# Patient Record
Sex: Female | Born: 1940 | Race: White | Hispanic: No | Marital: Married | Smoking: Former smoker
Health system: Southern US, Community
[De-identification: ages and names within clinical notes are randomized; demographics above are authoritative.]

## PROBLEM LIST (undated history)

## (undated) DIAGNOSIS — E079 Disorder of thyroid, unspecified: Secondary | ICD-10-CM

## (undated) DIAGNOSIS — E119 Type 2 diabetes mellitus without complications: Secondary | ICD-10-CM

## (undated) DIAGNOSIS — E538 Deficiency of other specified B group vitamins: Secondary | ICD-10-CM

## (undated) DIAGNOSIS — D689 Coagulation defect, unspecified: Secondary | ICD-10-CM

## (undated) DIAGNOSIS — D469 Myelodysplastic syndrome, unspecified: Secondary | ICD-10-CM

## (undated) DIAGNOSIS — D649 Anemia, unspecified: Secondary | ICD-10-CM

## (undated) DIAGNOSIS — D500 Iron deficiency anemia secondary to blood loss (chronic): Secondary | ICD-10-CM

## (undated) DIAGNOSIS — K589 Irritable bowel syndrome without diarrhea: Secondary | ICD-10-CM

## (undated) HISTORY — PX: APPENDECTOMY: SHX54

## (undated) HISTORY — PX: CHOLECYSTECTOMY: SHX55

## (undated) HISTORY — DX: Irritable bowel syndrome, unspecified: K58.9

## (undated) HISTORY — DX: Anemia, unspecified: D64.9

## (undated) HISTORY — DX: Disorder of thyroid, unspecified: E07.9

## (undated) HISTORY — DX: Deficiency of other specified B group vitamins: E53.8

## (undated) HISTORY — DX: Iron deficiency anemia secondary to blood loss (chronic): D50.0

## (undated) HISTORY — DX: Coagulation defect, unspecified: D68.9

## (undated) HISTORY — DX: Type 2 diabetes mellitus without complications: E11.9

## (undated) HISTORY — DX: Myelodysplastic syndrome, unspecified: D46.9

## (undated) HISTORY — PX: OTHER SURGICAL HISTORY: SHX169

---

## 2014-07-10 HISTORY — PX: CORONARY ANGIOPLASTY WITH STENT PLACEMENT: SHX49

## 2015-06-07 LAB — PULMONARY FUNCTION TEST

## 2016-07-21 DIAGNOSIS — E11649 Type 2 diabetes mellitus with hypoglycemia without coma: Secondary | ICD-10-CM | POA: Diagnosis not present

## 2016-07-21 DIAGNOSIS — E1121 Type 2 diabetes mellitus with diabetic nephropathy: Secondary | ICD-10-CM | POA: Diagnosis not present

## 2016-07-21 DIAGNOSIS — E039 Hypothyroidism, unspecified: Secondary | ICD-10-CM | POA: Diagnosis not present

## 2016-07-21 DIAGNOSIS — E782 Mixed hyperlipidemia: Secondary | ICD-10-CM | POA: Diagnosis not present

## 2016-08-01 DIAGNOSIS — L820 Inflamed seborrheic keratosis: Secondary | ICD-10-CM | POA: Diagnosis not present

## 2016-08-01 DIAGNOSIS — I10 Essential (primary) hypertension: Secondary | ICD-10-CM | POA: Diagnosis not present

## 2016-08-01 DIAGNOSIS — E1121 Type 2 diabetes mellitus with diabetic nephropathy: Secondary | ICD-10-CM | POA: Diagnosis not present

## 2016-08-01 DIAGNOSIS — E782 Mixed hyperlipidemia: Secondary | ICD-10-CM | POA: Diagnosis not present

## 2016-08-01 DIAGNOSIS — E039 Hypothyroidism, unspecified: Secondary | ICD-10-CM | POA: Diagnosis not present

## 2016-12-13 DIAGNOSIS — E1151 Type 2 diabetes mellitus with diabetic peripheral angiopathy without gangrene: Secondary | ICD-10-CM | POA: Diagnosis not present

## 2016-12-13 DIAGNOSIS — L84 Corns and callosities: Secondary | ICD-10-CM | POA: Diagnosis not present

## 2016-12-13 DIAGNOSIS — L609 Nail disorder, unspecified: Secondary | ICD-10-CM | POA: Diagnosis not present

## 2017-01-18 DIAGNOSIS — I5020 Unspecified systolic (congestive) heart failure: Secondary | ICD-10-CM | POA: Diagnosis not present

## 2017-01-18 DIAGNOSIS — I10 Essential (primary) hypertension: Secondary | ICD-10-CM | POA: Diagnosis not present

## 2017-01-18 DIAGNOSIS — I2510 Atherosclerotic heart disease of native coronary artery without angina pectoris: Secondary | ICD-10-CM | POA: Diagnosis not present

## 2017-01-18 DIAGNOSIS — E785 Hyperlipidemia, unspecified: Secondary | ICD-10-CM | POA: Diagnosis not present

## 2017-01-19 DIAGNOSIS — E782 Mixed hyperlipidemia: Secondary | ICD-10-CM | POA: Diagnosis not present

## 2017-01-19 DIAGNOSIS — E039 Hypothyroidism, unspecified: Secondary | ICD-10-CM | POA: Diagnosis not present

## 2017-01-19 DIAGNOSIS — E1121 Type 2 diabetes mellitus with diabetic nephropathy: Secondary | ICD-10-CM | POA: Diagnosis not present

## 2017-01-22 DIAGNOSIS — E782 Mixed hyperlipidemia: Secondary | ICD-10-CM | POA: Diagnosis not present

## 2017-01-22 DIAGNOSIS — E1121 Type 2 diabetes mellitus with diabetic nephropathy: Secondary | ICD-10-CM | POA: Diagnosis not present

## 2017-01-22 DIAGNOSIS — E039 Hypothyroidism, unspecified: Secondary | ICD-10-CM | POA: Diagnosis not present

## 2017-01-29 DIAGNOSIS — E1121 Type 2 diabetes mellitus with diabetic nephropathy: Secondary | ICD-10-CM | POA: Diagnosis not present

## 2017-01-29 DIAGNOSIS — R609 Edema, unspecified: Secondary | ICD-10-CM | POA: Diagnosis not present

## 2017-01-29 DIAGNOSIS — E782 Mixed hyperlipidemia: Secondary | ICD-10-CM | POA: Diagnosis not present

## 2017-01-29 DIAGNOSIS — E039 Hypothyroidism, unspecified: Secondary | ICD-10-CM | POA: Diagnosis not present

## 2017-01-29 DIAGNOSIS — I10 Essential (primary) hypertension: Secondary | ICD-10-CM | POA: Diagnosis not present

## 2017-03-06 DIAGNOSIS — Z1231 Encounter for screening mammogram for malignant neoplasm of breast: Secondary | ICD-10-CM | POA: Diagnosis not present

## 2017-03-26 DIAGNOSIS — Z23 Encounter for immunization: Secondary | ICD-10-CM | POA: Diagnosis not present

## 2017-07-24 DIAGNOSIS — I2510 Atherosclerotic heart disease of native coronary artery without angina pectoris: Secondary | ICD-10-CM | POA: Diagnosis not present

## 2017-07-24 DIAGNOSIS — I5020 Unspecified systolic (congestive) heart failure: Secondary | ICD-10-CM | POA: Diagnosis not present

## 2017-07-24 DIAGNOSIS — E785 Hyperlipidemia, unspecified: Secondary | ICD-10-CM | POA: Diagnosis not present

## 2017-07-24 DIAGNOSIS — I10 Essential (primary) hypertension: Secondary | ICD-10-CM | POA: Diagnosis not present

## 2017-08-06 DIAGNOSIS — R0989 Other specified symptoms and signs involving the circulatory and respiratory systems: Secondary | ICD-10-CM | POA: Diagnosis not present

## 2017-08-06 DIAGNOSIS — I2510 Atherosclerotic heart disease of native coronary artery without angina pectoris: Secondary | ICD-10-CM | POA: Diagnosis present

## 2017-08-06 DIAGNOSIS — I248 Other forms of acute ischemic heart disease: Secondary | ICD-10-CM | POA: Diagnosis present

## 2017-08-06 DIAGNOSIS — I110 Hypertensive heart disease with heart failure: Secondary | ICD-10-CM | POA: Diagnosis present

## 2017-08-06 DIAGNOSIS — J189 Pneumonia, unspecified organism: Secondary | ICD-10-CM | POA: Diagnosis not present

## 2017-08-06 DIAGNOSIS — I214 Non-ST elevation (NSTEMI) myocardial infarction: Secondary | ICD-10-CM | POA: Diagnosis not present

## 2017-08-06 DIAGNOSIS — R0600 Dyspnea, unspecified: Secondary | ICD-10-CM | POA: Diagnosis not present

## 2017-08-06 DIAGNOSIS — E119 Type 2 diabetes mellitus without complications: Secondary | ICD-10-CM | POA: Diagnosis present

## 2017-08-06 DIAGNOSIS — R079 Chest pain, unspecified: Secondary | ICD-10-CM | POA: Diagnosis not present

## 2017-08-06 DIAGNOSIS — R262 Difficulty in walking, not elsewhere classified: Secondary | ICD-10-CM | POA: Diagnosis not present

## 2017-08-06 DIAGNOSIS — K5731 Diverticulosis of large intestine without perforation or abscess with bleeding: Secondary | ICD-10-CM | POA: Diagnosis present

## 2017-08-06 DIAGNOSIS — K588 Other irritable bowel syndrome: Secondary | ICD-10-CM | POA: Diagnosis not present

## 2017-08-06 DIAGNOSIS — J449 Chronic obstructive pulmonary disease, unspecified: Secondary | ICD-10-CM | POA: Diagnosis present

## 2017-08-06 DIAGNOSIS — D62 Acute posthemorrhagic anemia: Secondary | ICD-10-CM | POA: Diagnosis not present

## 2017-08-06 DIAGNOSIS — Z9989 Dependence on other enabling machines and devices: Secondary | ICD-10-CM | POA: Diagnosis not present

## 2017-08-06 DIAGNOSIS — R52 Pain, unspecified: Secondary | ICD-10-CM | POA: Diagnosis not present

## 2017-08-06 DIAGNOSIS — Z743 Need for continuous supervision: Secondary | ICD-10-CM | POA: Diagnosis not present

## 2017-08-06 DIAGNOSIS — I5033 Acute on chronic diastolic (congestive) heart failure: Secondary | ICD-10-CM | POA: Diagnosis present

## 2017-08-06 DIAGNOSIS — D649 Anemia, unspecified: Secondary | ICD-10-CM | POA: Diagnosis not present

## 2017-08-06 DIAGNOSIS — K922 Gastrointestinal hemorrhage, unspecified: Secondary | ICD-10-CM | POA: Diagnosis not present

## 2017-08-06 DIAGNOSIS — K589 Irritable bowel syndrome without diarrhea: Secondary | ICD-10-CM | POA: Diagnosis present

## 2017-08-06 DIAGNOSIS — R531 Weakness: Secondary | ICD-10-CM | POA: Diagnosis not present

## 2017-08-06 DIAGNOSIS — I21A1 Myocardial infarction type 2: Secondary | ICD-10-CM | POA: Diagnosis present

## 2017-08-06 DIAGNOSIS — R0602 Shortness of breath: Secondary | ICD-10-CM | POA: Diagnosis not present

## 2017-08-06 DIAGNOSIS — Z7982 Long term (current) use of aspirin: Secondary | ICD-10-CM | POA: Diagnosis not present

## 2017-08-06 DIAGNOSIS — K625 Hemorrhage of anus and rectum: Secondary | ICD-10-CM | POA: Diagnosis not present

## 2017-08-06 DIAGNOSIS — R071 Chest pain on breathing: Secondary | ICD-10-CM | POA: Diagnosis not present

## 2017-08-06 DIAGNOSIS — R918 Other nonspecific abnormal finding of lung field: Secondary | ICD-10-CM | POA: Diagnosis not present

## 2017-08-06 DIAGNOSIS — K921 Melena: Secondary | ICD-10-CM | POA: Diagnosis not present

## 2017-08-06 DIAGNOSIS — J9811 Atelectasis: Secondary | ICD-10-CM | POA: Diagnosis not present

## 2017-08-06 DIAGNOSIS — J690 Pneumonitis due to inhalation of food and vomit: Secondary | ICD-10-CM | POA: Diagnosis present

## 2017-08-06 DIAGNOSIS — I080 Rheumatic disorders of both mitral and aortic valves: Secondary | ICD-10-CM | POA: Diagnosis present

## 2017-08-06 DIAGNOSIS — D500 Iron deficiency anemia secondary to blood loss (chronic): Secondary | ICD-10-CM | POA: Diagnosis present

## 2017-08-06 DIAGNOSIS — I959 Hypotension, unspecified: Secondary | ICD-10-CM | POA: Diagnosis not present

## 2017-08-06 DIAGNOSIS — Z87891 Personal history of nicotine dependence: Secondary | ICD-10-CM | POA: Diagnosis not present

## 2017-08-06 DIAGNOSIS — E785 Hyperlipidemia, unspecified: Secondary | ICD-10-CM | POA: Diagnosis present

## 2017-08-06 DIAGNOSIS — E039 Hypothyroidism, unspecified: Secondary | ICD-10-CM | POA: Diagnosis present

## 2017-08-06 DIAGNOSIS — I10 Essential (primary) hypertension: Secondary | ICD-10-CM | POA: Diagnosis not present

## 2017-08-06 DIAGNOSIS — J90 Pleural effusion, not elsewhere classified: Secondary | ICD-10-CM | POA: Diagnosis not present

## 2017-08-06 DIAGNOSIS — R0789 Other chest pain: Secondary | ICD-10-CM | POA: Diagnosis not present

## 2017-08-06 DIAGNOSIS — Z794 Long term (current) use of insulin: Secondary | ICD-10-CM | POA: Diagnosis not present

## 2017-08-09 DIAGNOSIS — E785 Hyperlipidemia, unspecified: Secondary | ICD-10-CM | POA: Diagnosis not present

## 2017-08-09 DIAGNOSIS — K922 Gastrointestinal hemorrhage, unspecified: Secondary | ICD-10-CM | POA: Diagnosis not present

## 2017-08-09 DIAGNOSIS — E1165 Type 2 diabetes mellitus with hyperglycemia: Secondary | ICD-10-CM | POA: Diagnosis not present

## 2017-08-09 DIAGNOSIS — K222 Esophageal obstruction: Secondary | ICD-10-CM | POA: Diagnosis not present

## 2017-08-09 DIAGNOSIS — R262 Difficulty in walking, not elsewhere classified: Secondary | ICD-10-CM | POA: Diagnosis not present

## 2017-08-09 DIAGNOSIS — Z87891 Personal history of nicotine dependence: Secondary | ICD-10-CM | POA: Diagnosis not present

## 2017-08-09 DIAGNOSIS — K588 Other irritable bowel syndrome: Secondary | ICD-10-CM | POA: Diagnosis not present

## 2017-08-09 DIAGNOSIS — R0789 Other chest pain: Secondary | ICD-10-CM | POA: Diagnosis not present

## 2017-08-09 DIAGNOSIS — I252 Old myocardial infarction: Secondary | ICD-10-CM | POA: Diagnosis not present

## 2017-08-09 DIAGNOSIS — K5520 Angiodysplasia of colon without hemorrhage: Secondary | ICD-10-CM | POA: Diagnosis not present

## 2017-08-09 DIAGNOSIS — K31819 Angiodysplasia of stomach and duodenum without bleeding: Secondary | ICD-10-CM | POA: Diagnosis not present

## 2017-08-09 DIAGNOSIS — J189 Pneumonia, unspecified organism: Secondary | ICD-10-CM | POA: Diagnosis not present

## 2017-08-09 DIAGNOSIS — E119 Type 2 diabetes mellitus without complications: Secondary | ICD-10-CM | POA: Diagnosis not present

## 2017-08-09 DIAGNOSIS — E039 Hypothyroidism, unspecified: Secondary | ICD-10-CM | POA: Diagnosis not present

## 2017-08-09 DIAGNOSIS — F4322 Adjustment disorder with anxiety: Secondary | ICD-10-CM | POA: Diagnosis not present

## 2017-08-09 DIAGNOSIS — K635 Polyp of colon: Secondary | ICD-10-CM | POA: Diagnosis not present

## 2017-08-09 DIAGNOSIS — Q2733 Arteriovenous malformation of digestive system vessel: Secondary | ICD-10-CM | POA: Diagnosis not present

## 2017-08-09 DIAGNOSIS — Z743 Need for continuous supervision: Secondary | ICD-10-CM | POA: Diagnosis not present

## 2017-08-09 DIAGNOSIS — Z794 Long term (current) use of insulin: Secondary | ICD-10-CM | POA: Diagnosis not present

## 2017-08-09 DIAGNOSIS — D649 Anemia, unspecified: Secondary | ICD-10-CM | POA: Diagnosis not present

## 2017-08-09 DIAGNOSIS — D122 Benign neoplasm of ascending colon: Secondary | ICD-10-CM | POA: Diagnosis not present

## 2017-08-09 DIAGNOSIS — K648 Other hemorrhoids: Secondary | ICD-10-CM | POA: Diagnosis not present

## 2017-08-09 DIAGNOSIS — K921 Melena: Secondary | ICD-10-CM | POA: Diagnosis not present

## 2017-08-09 DIAGNOSIS — K2950 Unspecified chronic gastritis without bleeding: Secondary | ICD-10-CM | POA: Diagnosis not present

## 2017-08-09 DIAGNOSIS — R531 Weakness: Secondary | ICD-10-CM | POA: Diagnosis not present

## 2017-08-09 DIAGNOSIS — J690 Pneumonitis due to inhalation of food and vomit: Secondary | ICD-10-CM | POA: Diagnosis not present

## 2017-08-09 DIAGNOSIS — I10 Essential (primary) hypertension: Secondary | ICD-10-CM | POA: Diagnosis not present

## 2017-08-09 DIAGNOSIS — Z955 Presence of coronary angioplasty implant and graft: Secondary | ICD-10-CM | POA: Diagnosis not present

## 2017-08-09 DIAGNOSIS — E7800 Pure hypercholesterolemia, unspecified: Secondary | ICD-10-CM | POA: Diagnosis not present

## 2017-08-09 DIAGNOSIS — R52 Pain, unspecified: Secondary | ICD-10-CM | POA: Diagnosis not present

## 2017-08-10 DIAGNOSIS — E039 Hypothyroidism, unspecified: Secondary | ICD-10-CM | POA: Diagnosis not present

## 2017-08-10 DIAGNOSIS — I10 Essential (primary) hypertension: Secondary | ICD-10-CM | POA: Diagnosis not present

## 2017-08-10 DIAGNOSIS — E785 Hyperlipidemia, unspecified: Secondary | ICD-10-CM | POA: Diagnosis not present

## 2017-08-10 DIAGNOSIS — K922 Gastrointestinal hemorrhage, unspecified: Secondary | ICD-10-CM | POA: Diagnosis not present

## 2017-08-10 DIAGNOSIS — E1165 Type 2 diabetes mellitus with hyperglycemia: Secondary | ICD-10-CM | POA: Diagnosis not present

## 2017-08-10 DIAGNOSIS — Z87891 Personal history of nicotine dependence: Secondary | ICD-10-CM | POA: Diagnosis not present

## 2017-08-10 DIAGNOSIS — J690 Pneumonitis due to inhalation of food and vomit: Secondary | ICD-10-CM | POA: Diagnosis not present

## 2017-08-17 DIAGNOSIS — I10 Essential (primary) hypertension: Secondary | ICD-10-CM | POA: Diagnosis not present

## 2017-08-17 DIAGNOSIS — D122 Benign neoplasm of ascending colon: Secondary | ICD-10-CM | POA: Diagnosis not present

## 2017-08-17 DIAGNOSIS — J690 Pneumonitis due to inhalation of food and vomit: Secondary | ICD-10-CM | POA: Diagnosis not present

## 2017-08-17 DIAGNOSIS — K222 Esophageal obstruction: Secondary | ICD-10-CM | POA: Diagnosis not present

## 2017-08-17 DIAGNOSIS — K921 Melena: Secondary | ICD-10-CM | POA: Diagnosis not present

## 2017-08-17 DIAGNOSIS — E039 Hypothyroidism, unspecified: Secondary | ICD-10-CM | POA: Diagnosis not present

## 2017-08-17 DIAGNOSIS — K648 Other hemorrhoids: Secondary | ICD-10-CM | POA: Diagnosis not present

## 2017-08-17 DIAGNOSIS — K922 Gastrointestinal hemorrhage, unspecified: Secondary | ICD-10-CM | POA: Diagnosis not present

## 2017-08-17 DIAGNOSIS — Q2733 Arteriovenous malformation of digestive system vessel: Secondary | ICD-10-CM | POA: Diagnosis not present

## 2017-08-17 DIAGNOSIS — Z955 Presence of coronary angioplasty implant and graft: Secondary | ICD-10-CM | POA: Diagnosis not present

## 2017-08-17 DIAGNOSIS — K5520 Angiodysplasia of colon without hemorrhage: Secondary | ICD-10-CM | POA: Diagnosis not present

## 2017-08-17 DIAGNOSIS — E1165 Type 2 diabetes mellitus with hyperglycemia: Secondary | ICD-10-CM | POA: Diagnosis not present

## 2017-08-17 DIAGNOSIS — K2950 Unspecified chronic gastritis without bleeding: Secondary | ICD-10-CM | POA: Diagnosis not present

## 2017-08-17 DIAGNOSIS — K635 Polyp of colon: Secondary | ICD-10-CM | POA: Diagnosis not present

## 2017-08-17 DIAGNOSIS — K31819 Angiodysplasia of stomach and duodenum without bleeding: Secondary | ICD-10-CM | POA: Diagnosis not present

## 2017-08-17 DIAGNOSIS — D649 Anemia, unspecified: Secondary | ICD-10-CM | POA: Diagnosis not present

## 2017-08-17 DIAGNOSIS — Z87891 Personal history of nicotine dependence: Secondary | ICD-10-CM | POA: Diagnosis not present

## 2017-08-21 DIAGNOSIS — Z794 Long term (current) use of insulin: Secondary | ICD-10-CM | POA: Diagnosis not present

## 2017-08-21 DIAGNOSIS — I252 Old myocardial infarction: Secondary | ICD-10-CM | POA: Diagnosis not present

## 2017-08-21 DIAGNOSIS — I2510 Atherosclerotic heart disease of native coronary artery without angina pectoris: Secondary | ICD-10-CM | POA: Diagnosis not present

## 2017-08-21 DIAGNOSIS — J449 Chronic obstructive pulmonary disease, unspecified: Secondary | ICD-10-CM | POA: Diagnosis not present

## 2017-08-21 DIAGNOSIS — E119 Type 2 diabetes mellitus without complications: Secondary | ICD-10-CM | POA: Diagnosis not present

## 2017-08-21 DIAGNOSIS — K5731 Diverticulosis of large intestine without perforation or abscess with bleeding: Secondary | ICD-10-CM | POA: Diagnosis not present

## 2017-08-21 DIAGNOSIS — K219 Gastro-esophageal reflux disease without esophagitis: Secondary | ICD-10-CM | POA: Diagnosis not present

## 2017-08-21 DIAGNOSIS — D500 Iron deficiency anemia secondary to blood loss (chronic): Secondary | ICD-10-CM | POA: Diagnosis not present

## 2017-08-21 DIAGNOSIS — Z87891 Personal history of nicotine dependence: Secondary | ICD-10-CM | POA: Diagnosis not present

## 2017-08-21 DIAGNOSIS — I110 Hypertensive heart disease with heart failure: Secondary | ICD-10-CM | POA: Diagnosis not present

## 2017-08-21 DIAGNOSIS — I5032 Chronic diastolic (congestive) heart failure: Secondary | ICD-10-CM | POA: Diagnosis not present

## 2017-08-22 DIAGNOSIS — K5731 Diverticulosis of large intestine without perforation or abscess with bleeding: Secondary | ICD-10-CM | POA: Diagnosis not present

## 2017-08-22 DIAGNOSIS — D500 Iron deficiency anemia secondary to blood loss (chronic): Secondary | ICD-10-CM | POA: Diagnosis not present

## 2017-08-22 DIAGNOSIS — I252 Old myocardial infarction: Secondary | ICD-10-CM | POA: Diagnosis not present

## 2017-08-22 DIAGNOSIS — E119 Type 2 diabetes mellitus without complications: Secondary | ICD-10-CM | POA: Diagnosis not present

## 2017-08-22 DIAGNOSIS — I5032 Chronic diastolic (congestive) heart failure: Secondary | ICD-10-CM | POA: Diagnosis not present

## 2017-08-22 DIAGNOSIS — I110 Hypertensive heart disease with heart failure: Secondary | ICD-10-CM | POA: Diagnosis not present

## 2017-08-24 DIAGNOSIS — I10 Essential (primary) hypertension: Secondary | ICD-10-CM | POA: Diagnosis not present

## 2017-08-24 DIAGNOSIS — R531 Weakness: Secondary | ICD-10-CM | POA: Diagnosis not present

## 2017-08-24 DIAGNOSIS — E1121 Type 2 diabetes mellitus with diabetic nephropathy: Secondary | ICD-10-CM | POA: Diagnosis not present

## 2017-08-24 DIAGNOSIS — J189 Pneumonia, unspecified organism: Secondary | ICD-10-CM | POA: Diagnosis not present

## 2017-08-24 DIAGNOSIS — D649 Anemia, unspecified: Secondary | ICD-10-CM | POA: Diagnosis not present

## 2017-08-24 DIAGNOSIS — E782 Mixed hyperlipidemia: Secondary | ICD-10-CM | POA: Diagnosis not present

## 2017-08-27 DIAGNOSIS — E119 Type 2 diabetes mellitus without complications: Secondary | ICD-10-CM | POA: Diagnosis not present

## 2017-08-27 DIAGNOSIS — K5731 Diverticulosis of large intestine without perforation or abscess with bleeding: Secondary | ICD-10-CM | POA: Diagnosis not present

## 2017-08-27 DIAGNOSIS — I252 Old myocardial infarction: Secondary | ICD-10-CM | POA: Diagnosis not present

## 2017-08-27 DIAGNOSIS — I5032 Chronic diastolic (congestive) heart failure: Secondary | ICD-10-CM | POA: Diagnosis not present

## 2017-08-27 DIAGNOSIS — I110 Hypertensive heart disease with heart failure: Secondary | ICD-10-CM | POA: Diagnosis not present

## 2017-08-27 DIAGNOSIS — D500 Iron deficiency anemia secondary to blood loss (chronic): Secondary | ICD-10-CM | POA: Diagnosis not present

## 2017-08-29 DIAGNOSIS — I252 Old myocardial infarction: Secondary | ICD-10-CM | POA: Diagnosis not present

## 2017-08-29 DIAGNOSIS — D500 Iron deficiency anemia secondary to blood loss (chronic): Secondary | ICD-10-CM | POA: Diagnosis not present

## 2017-08-29 DIAGNOSIS — E119 Type 2 diabetes mellitus without complications: Secondary | ICD-10-CM | POA: Diagnosis not present

## 2017-08-29 DIAGNOSIS — K5731 Diverticulosis of large intestine without perforation or abscess with bleeding: Secondary | ICD-10-CM | POA: Diagnosis not present

## 2017-08-29 DIAGNOSIS — I5032 Chronic diastolic (congestive) heart failure: Secondary | ICD-10-CM | POA: Diagnosis not present

## 2017-08-29 DIAGNOSIS — I110 Hypertensive heart disease with heart failure: Secondary | ICD-10-CM | POA: Diagnosis not present

## 2017-08-31 DIAGNOSIS — I5032 Chronic diastolic (congestive) heart failure: Secondary | ICD-10-CM | POA: Diagnosis not present

## 2017-08-31 DIAGNOSIS — E119 Type 2 diabetes mellitus without complications: Secondary | ICD-10-CM | POA: Diagnosis not present

## 2017-08-31 DIAGNOSIS — K5731 Diverticulosis of large intestine without perforation or abscess with bleeding: Secondary | ICD-10-CM | POA: Diagnosis not present

## 2017-08-31 DIAGNOSIS — I110 Hypertensive heart disease with heart failure: Secondary | ICD-10-CM | POA: Diagnosis not present

## 2017-08-31 DIAGNOSIS — I252 Old myocardial infarction: Secondary | ICD-10-CM | POA: Diagnosis not present

## 2017-08-31 DIAGNOSIS — D500 Iron deficiency anemia secondary to blood loss (chronic): Secondary | ICD-10-CM | POA: Diagnosis not present

## 2017-09-04 DIAGNOSIS — D500 Iron deficiency anemia secondary to blood loss (chronic): Secondary | ICD-10-CM | POA: Diagnosis not present

## 2017-09-04 DIAGNOSIS — K5731 Diverticulosis of large intestine without perforation or abscess with bleeding: Secondary | ICD-10-CM | POA: Diagnosis not present

## 2017-09-04 DIAGNOSIS — I5032 Chronic diastolic (congestive) heart failure: Secondary | ICD-10-CM | POA: Diagnosis not present

## 2017-09-04 DIAGNOSIS — E119 Type 2 diabetes mellitus without complications: Secondary | ICD-10-CM | POA: Diagnosis not present

## 2017-09-04 DIAGNOSIS — I110 Hypertensive heart disease with heart failure: Secondary | ICD-10-CM | POA: Diagnosis not present

## 2017-09-04 DIAGNOSIS — I252 Old myocardial infarction: Secondary | ICD-10-CM | POA: Diagnosis not present

## 2017-09-06 DIAGNOSIS — I5032 Chronic diastolic (congestive) heart failure: Secondary | ICD-10-CM | POA: Diagnosis not present

## 2017-09-06 DIAGNOSIS — I252 Old myocardial infarction: Secondary | ICD-10-CM | POA: Diagnosis not present

## 2017-09-06 DIAGNOSIS — K5731 Diverticulosis of large intestine without perforation or abscess with bleeding: Secondary | ICD-10-CM | POA: Diagnosis not present

## 2017-09-06 DIAGNOSIS — D500 Iron deficiency anemia secondary to blood loss (chronic): Secondary | ICD-10-CM | POA: Diagnosis not present

## 2017-09-06 DIAGNOSIS — I110 Hypertensive heart disease with heart failure: Secondary | ICD-10-CM | POA: Diagnosis not present

## 2017-09-06 DIAGNOSIS — E119 Type 2 diabetes mellitus without complications: Secondary | ICD-10-CM | POA: Diagnosis not present

## 2017-09-07 DIAGNOSIS — K625 Hemorrhage of anus and rectum: Secondary | ICD-10-CM | POA: Diagnosis not present

## 2017-09-07 DIAGNOSIS — Q2733 Arteriovenous malformation of digestive system vessel: Secondary | ICD-10-CM | POA: Diagnosis not present

## 2017-09-07 DIAGNOSIS — D696 Thrombocytopenia, unspecified: Secondary | ICD-10-CM | POA: Diagnosis not present

## 2017-09-07 DIAGNOSIS — R162 Hepatomegaly with splenomegaly, not elsewhere classified: Secondary | ICD-10-CM | POA: Diagnosis not present

## 2017-09-07 DIAGNOSIS — K760 Fatty (change of) liver, not elsewhere classified: Secondary | ICD-10-CM | POA: Diagnosis not present

## 2017-09-07 DIAGNOSIS — R531 Weakness: Secondary | ICD-10-CM | POA: Diagnosis not present

## 2017-09-07 DIAGNOSIS — E611 Iron deficiency: Secondary | ICD-10-CM | POA: Diagnosis not present

## 2017-09-07 DIAGNOSIS — D649 Anemia, unspecified: Secondary | ICD-10-CM | POA: Diagnosis not present

## 2017-09-10 DIAGNOSIS — K5731 Diverticulosis of large intestine without perforation or abscess with bleeding: Secondary | ICD-10-CM | POA: Diagnosis not present

## 2017-09-10 DIAGNOSIS — D500 Iron deficiency anemia secondary to blood loss (chronic): Secondary | ICD-10-CM | POA: Diagnosis not present

## 2017-09-10 DIAGNOSIS — I252 Old myocardial infarction: Secondary | ICD-10-CM | POA: Diagnosis not present

## 2017-09-10 DIAGNOSIS — E119 Type 2 diabetes mellitus without complications: Secondary | ICD-10-CM | POA: Diagnosis not present

## 2017-09-10 DIAGNOSIS — I5032 Chronic diastolic (congestive) heart failure: Secondary | ICD-10-CM | POA: Diagnosis not present

## 2017-09-10 DIAGNOSIS — I110 Hypertensive heart disease with heart failure: Secondary | ICD-10-CM | POA: Diagnosis not present

## 2017-09-12 DIAGNOSIS — I252 Old myocardial infarction: Secondary | ICD-10-CM | POA: Diagnosis not present

## 2017-09-12 DIAGNOSIS — E119 Type 2 diabetes mellitus without complications: Secondary | ICD-10-CM | POA: Diagnosis not present

## 2017-09-12 DIAGNOSIS — I110 Hypertensive heart disease with heart failure: Secondary | ICD-10-CM | POA: Diagnosis not present

## 2017-09-12 DIAGNOSIS — D500 Iron deficiency anemia secondary to blood loss (chronic): Secondary | ICD-10-CM | POA: Diagnosis not present

## 2017-09-12 DIAGNOSIS — I5032 Chronic diastolic (congestive) heart failure: Secondary | ICD-10-CM | POA: Diagnosis not present

## 2017-09-12 DIAGNOSIS — K5731 Diverticulosis of large intestine without perforation or abscess with bleeding: Secondary | ICD-10-CM | POA: Diagnosis not present

## 2017-09-14 DIAGNOSIS — D699 Hemorrhagic condition, unspecified: Secondary | ICD-10-CM | POA: Diagnosis not present

## 2017-09-14 DIAGNOSIS — D649 Anemia, unspecified: Secondary | ICD-10-CM | POA: Diagnosis not present

## 2017-09-14 DIAGNOSIS — N281 Cyst of kidney, acquired: Secondary | ICD-10-CM | POA: Diagnosis not present

## 2017-09-14 DIAGNOSIS — K760 Fatty (change of) liver, not elsewhere classified: Secondary | ICD-10-CM | POA: Diagnosis not present

## 2017-09-20 DIAGNOSIS — D649 Anemia, unspecified: Secondary | ICD-10-CM | POA: Diagnosis not present

## 2017-09-20 DIAGNOSIS — K625 Hemorrhage of anus and rectum: Secondary | ICD-10-CM | POA: Diagnosis not present

## 2017-09-20 DIAGNOSIS — K2970 Gastritis, unspecified, without bleeding: Secondary | ICD-10-CM | POA: Diagnosis not present

## 2017-09-20 DIAGNOSIS — I998 Other disorder of circulatory system: Secondary | ICD-10-CM | POA: Diagnosis not present

## 2017-09-21 DIAGNOSIS — Q2733 Arteriovenous malformation of digestive system vessel: Secondary | ICD-10-CM | POA: Diagnosis not present

## 2017-09-21 DIAGNOSIS — R531 Weakness: Secondary | ICD-10-CM | POA: Diagnosis not present

## 2017-09-21 DIAGNOSIS — I214 Non-ST elevation (NSTEMI) myocardial infarction: Secondary | ICD-10-CM | POA: Diagnosis not present

## 2017-09-21 DIAGNOSIS — D696 Thrombocytopenia, unspecified: Secondary | ICD-10-CM | POA: Diagnosis not present

## 2017-09-21 DIAGNOSIS — K760 Fatty (change of) liver, not elsewhere classified: Secondary | ICD-10-CM | POA: Diagnosis not present

## 2017-09-21 DIAGNOSIS — R162 Hepatomegaly with splenomegaly, not elsewhere classified: Secondary | ICD-10-CM | POA: Diagnosis not present

## 2017-09-21 DIAGNOSIS — E611 Iron deficiency: Secondary | ICD-10-CM | POA: Diagnosis not present

## 2017-09-21 DIAGNOSIS — K625 Hemorrhage of anus and rectum: Secondary | ICD-10-CM | POA: Diagnosis not present

## 2017-09-21 DIAGNOSIS — D649 Anemia, unspecified: Secondary | ICD-10-CM | POA: Diagnosis not present

## 2017-10-04 DIAGNOSIS — Q2733 Arteriovenous malformation of digestive system vessel: Secondary | ICD-10-CM | POA: Diagnosis not present

## 2017-10-04 DIAGNOSIS — K625 Hemorrhage of anus and rectum: Secondary | ICD-10-CM | POA: Diagnosis not present

## 2017-10-04 DIAGNOSIS — R162 Hepatomegaly with splenomegaly, not elsewhere classified: Secondary | ICD-10-CM | POA: Diagnosis not present

## 2017-10-04 DIAGNOSIS — D696 Thrombocytopenia, unspecified: Secondary | ICD-10-CM | POA: Diagnosis not present

## 2017-10-04 DIAGNOSIS — K760 Fatty (change of) liver, not elsewhere classified: Secondary | ICD-10-CM | POA: Diagnosis not present

## 2017-10-04 DIAGNOSIS — D46Z Other myelodysplastic syndromes: Secondary | ICD-10-CM | POA: Diagnosis not present

## 2017-10-04 DIAGNOSIS — E611 Iron deficiency: Secondary | ICD-10-CM | POA: Diagnosis not present

## 2017-10-04 DIAGNOSIS — D469 Myelodysplastic syndrome, unspecified: Secondary | ICD-10-CM | POA: Diagnosis not present

## 2017-10-04 DIAGNOSIS — R531 Weakness: Secondary | ICD-10-CM | POA: Diagnosis not present

## 2017-10-04 DIAGNOSIS — D649 Anemia, unspecified: Secondary | ICD-10-CM | POA: Diagnosis not present

## 2017-10-11 DIAGNOSIS — R233 Spontaneous ecchymoses: Secondary | ICD-10-CM | POA: Diagnosis not present

## 2017-10-11 DIAGNOSIS — D696 Thrombocytopenia, unspecified: Secondary | ICD-10-CM | POA: Diagnosis not present

## 2017-10-11 DIAGNOSIS — D469 Myelodysplastic syndrome, unspecified: Secondary | ICD-10-CM | POA: Diagnosis not present

## 2017-10-11 DIAGNOSIS — D649 Anemia, unspecified: Secondary | ICD-10-CM | POA: Diagnosis not present

## 2017-10-18 DIAGNOSIS — D649 Anemia, unspecified: Secondary | ICD-10-CM | POA: Diagnosis not present

## 2017-10-18 DIAGNOSIS — R233 Spontaneous ecchymoses: Secondary | ICD-10-CM | POA: Diagnosis not present

## 2017-10-18 DIAGNOSIS — D696 Thrombocytopenia, unspecified: Secondary | ICD-10-CM | POA: Diagnosis not present

## 2017-10-18 DIAGNOSIS — D469 Myelodysplastic syndrome, unspecified: Secondary | ICD-10-CM | POA: Diagnosis not present

## 2017-10-25 DIAGNOSIS — D469 Myelodysplastic syndrome, unspecified: Secondary | ICD-10-CM | POA: Diagnosis not present

## 2017-10-25 DIAGNOSIS — D696 Thrombocytopenia, unspecified: Secondary | ICD-10-CM | POA: Diagnosis not present

## 2017-10-25 DIAGNOSIS — R233 Spontaneous ecchymoses: Secondary | ICD-10-CM | POA: Diagnosis not present

## 2017-10-25 DIAGNOSIS — D649 Anemia, unspecified: Secondary | ICD-10-CM | POA: Diagnosis not present

## 2017-11-01 DIAGNOSIS — D696 Thrombocytopenia, unspecified: Secondary | ICD-10-CM | POA: Diagnosis not present

## 2017-11-01 DIAGNOSIS — D469 Myelodysplastic syndrome, unspecified: Secondary | ICD-10-CM | POA: Diagnosis not present

## 2017-11-01 DIAGNOSIS — D649 Anemia, unspecified: Secondary | ICD-10-CM | POA: Diagnosis not present

## 2017-11-01 DIAGNOSIS — R233 Spontaneous ecchymoses: Secondary | ICD-10-CM | POA: Diagnosis not present

## 2017-11-07 DIAGNOSIS — E119 Type 2 diabetes mellitus without complications: Secondary | ICD-10-CM | POA: Diagnosis not present

## 2017-11-07 DIAGNOSIS — I2510 Atherosclerotic heart disease of native coronary artery without angina pectoris: Secondary | ICD-10-CM | POA: Diagnosis not present

## 2017-11-07 DIAGNOSIS — K648 Other hemorrhoids: Secondary | ICD-10-CM | POA: Diagnosis not present

## 2017-11-07 DIAGNOSIS — K922 Gastrointestinal hemorrhage, unspecified: Secondary | ICD-10-CM | POA: Diagnosis not present

## 2017-11-07 DIAGNOSIS — K2950 Unspecified chronic gastritis without bleeding: Secondary | ICD-10-CM | POA: Diagnosis not present

## 2017-11-07 DIAGNOSIS — K635 Polyp of colon: Secondary | ICD-10-CM | POA: Diagnosis not present

## 2017-11-07 DIAGNOSIS — K449 Diaphragmatic hernia without obstruction or gangrene: Secondary | ICD-10-CM | POA: Diagnosis not present

## 2017-11-07 DIAGNOSIS — D649 Anemia, unspecified: Secondary | ICD-10-CM | POA: Diagnosis not present

## 2017-11-07 DIAGNOSIS — K2970 Gastritis, unspecified, without bleeding: Secondary | ICD-10-CM | POA: Diagnosis not present

## 2017-11-07 DIAGNOSIS — D122 Benign neoplasm of ascending colon: Secondary | ICD-10-CM | POA: Diagnosis not present

## 2017-11-07 DIAGNOSIS — K529 Noninfective gastroenteritis and colitis, unspecified: Secondary | ICD-10-CM | POA: Diagnosis not present

## 2017-11-07 DIAGNOSIS — K5520 Angiodysplasia of colon without hemorrhage: Secondary | ICD-10-CM | POA: Diagnosis not present

## 2017-11-07 DIAGNOSIS — E7800 Pure hypercholesterolemia, unspecified: Secondary | ICD-10-CM | POA: Diagnosis not present

## 2017-11-07 DIAGNOSIS — I509 Heart failure, unspecified: Secondary | ICD-10-CM | POA: Diagnosis not present

## 2017-11-28 ENCOUNTER — Other Ambulatory Visit: Payer: Self-pay

## 2017-11-28 ENCOUNTER — Inpatient Hospital Stay: Payer: Medicare Other

## 2017-11-28 ENCOUNTER — Encounter: Payer: Self-pay | Admitting: Oncology

## 2017-11-28 ENCOUNTER — Inpatient Hospital Stay: Payer: Medicare Other | Attending: Oncology | Admitting: Oncology

## 2017-11-28 VITALS — BP 129/56 | HR 63 | Temp 97.0°F | Resp 18 | Wt 153.6 lb

## 2017-11-28 DIAGNOSIS — K625 Hemorrhage of anus and rectum: Secondary | ICD-10-CM | POA: Diagnosis not present

## 2017-11-28 DIAGNOSIS — Q2733 Arteriovenous malformation of digestive system vessel: Secondary | ICD-10-CM | POA: Diagnosis not present

## 2017-11-28 DIAGNOSIS — R5381 Other malaise: Secondary | ICD-10-CM | POA: Insufficient documentation

## 2017-11-28 DIAGNOSIS — K760 Fatty (change of) liver, not elsewhere classified: Secondary | ICD-10-CM | POA: Diagnosis not present

## 2017-11-28 DIAGNOSIS — D46C Myelodysplastic syndrome with isolated del(5q) chromosomal abnormality: Secondary | ICD-10-CM

## 2017-11-28 DIAGNOSIS — Z794 Long term (current) use of insulin: Secondary | ICD-10-CM | POA: Insufficient documentation

## 2017-11-28 DIAGNOSIS — D696 Thrombocytopenia, unspecified: Secondary | ICD-10-CM

## 2017-11-28 DIAGNOSIS — Z79899 Other long term (current) drug therapy: Secondary | ICD-10-CM | POA: Diagnosis not present

## 2017-11-28 DIAGNOSIS — D500 Iron deficiency anemia secondary to blood loss (chronic): Secondary | ICD-10-CM | POA: Insufficient documentation

## 2017-11-28 DIAGNOSIS — Z87891 Personal history of nicotine dependence: Secondary | ICD-10-CM | POA: Insufficient documentation

## 2017-11-28 DIAGNOSIS — R040 Epistaxis: Secondary | ICD-10-CM | POA: Diagnosis not present

## 2017-11-28 DIAGNOSIS — R5383 Other fatigue: Secondary | ICD-10-CM | POA: Diagnosis not present

## 2017-11-28 DIAGNOSIS — E119 Type 2 diabetes mellitus without complications: Secondary | ICD-10-CM | POA: Diagnosis not present

## 2017-11-28 LAB — COMPREHENSIVE METABOLIC PANEL
ALT: 17 U/L (ref 14–54)
AST: 22 U/L (ref 15–41)
Albumin: 3.5 g/dL (ref 3.5–5.0)
Alkaline Phosphatase: 110 U/L (ref 38–126)
Anion gap: 7 (ref 5–15)
BUN: 17 mg/dL (ref 6–20)
CHLORIDE: 106 mmol/L (ref 101–111)
CO2: 25 mmol/L (ref 22–32)
CREATININE: 1.04 mg/dL — AB (ref 0.44–1.00)
Calcium: 8.7 mg/dL — ABNORMAL LOW (ref 8.9–10.3)
GFR calc non Af Amer: 50 mL/min — ABNORMAL LOW (ref >60)
GFR, EST AFRICAN AMERICAN: 58 mL/min — AB (ref >60)
Glucose, Bld: 101 mg/dL — ABNORMAL HIGH (ref 65–99)
Potassium: 4 mmol/L (ref 3.5–5.1)
Sodium: 138 mmol/L (ref 135–145)
Total Bilirubin: 0.8 mg/dL (ref 0.3–1.2)
Total Protein: 6.6 g/dL (ref 6.5–8.1)

## 2017-11-28 LAB — CBC WITH DIFFERENTIAL/PLATELET
Basophils Absolute: 0 10*3/uL (ref 0.0–0.1)
Basophils Relative: 0 %
EOS PCT: 3 %
Eosinophils Absolute: 0.1 10*3/uL (ref 0.0–0.7)
HCT: 31.2 % — ABNORMAL LOW (ref 36.0–46.0)
Hemoglobin: 10.2 g/dL — ABNORMAL LOW (ref 12.0–15.0)
LYMPHS ABS: 1.1 10*3/uL (ref 0.7–4.0)
LYMPHS PCT: 38 %
MCH: 28.6 pg (ref 26.0–34.0)
MCHC: 32.7 g/dL (ref 30.0–36.0)
MCV: 87.4 fL (ref 78.0–100.0)
MONO ABS: 0.5 10*3/uL (ref 0.1–1.0)
MONOS PCT: 16 %
NEUTROS PCT: 42 %
Neutro Abs: 1.2 10*3/uL — ABNORMAL LOW (ref 1.7–7.7)
PLATELETS: 24 10*3/uL — AB (ref 150–400)
RBC: 3.57 MIL/uL — ABNORMAL LOW (ref 3.87–5.11)
RDW: 22.5 % — AB (ref 11.5–15.5)
WBC: 3 10*3/uL — ABNORMAL LOW (ref 4.0–10.5)

## 2017-11-28 LAB — TECHNOLOGIST SMEAR REVIEW

## 2017-11-28 LAB — TYPE AND SCREEN
ABO/RH(D): B POS
Antibody Screen: NEGATIVE

## 2017-11-28 LAB — IRON AND TIBC
IRON: 54 ug/dL (ref 28–170)
Saturation Ratios: 25 % (ref 10.4–31.8)
TIBC: 217 ug/dL — AB (ref 250–450)
UIBC: 163 ug/dL

## 2017-11-28 LAB — FERRITIN: Ferritin: 370 ng/mL — ABNORMAL HIGH (ref 11–307)

## 2017-11-28 NOTE — Progress Notes (Signed)
New patient relocated from [place] this month.  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  Daughter is wife pt today.  She is living at [facility] with husband.

## 2017-11-28 NOTE — Progress Notes (Signed)
Hematology/Oncology Consult note [facility] Telephone:([phone] [phone]   Patient Care Team: Glean Hess, MD as PCP - General (Internal Medicine)  REFERRING PROVIDER: Glean Hess, MD  Previous Oncologist Dr.Srujitha Murkutla CHIEF COMPLAINTS/PURPOSE OF CONSULTATION:  Evaluation of thrombocytopenia.   HISTORY OF PRESENTING ILLNESS:  Yesenia Hart is a  77 y.o.  female with PMH listed below who was referred to me for evaluation of thrombocytopenia.   Recently moved from [place] to [place].  She is living at [place] with husband.  Accompanied by daughter, Yesenia Hart who is a respiratory therapist at [employer].  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  Patient presented to emergency room in January 2019 with complaints of intermittent rectal bleeding and increased to have a hemoglobin of 3 and platelet counts was 59,000.  Received 5 units of PRBC and was discharged home with hemoglobin of 10.  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  Cauterization was deferred due to thrombocytopenia. Aspirin was discontinued due to GI bleeding. Peripheral blood flow, SPEP negative, no obvious signs of B12 deficiency.  Ultrasound of the abdomen showed mild fatty liver.  On October 04, 2017, patient underwent bone marrow biopsy. Bone marrow core biopsy, bone marrow aspirate clot, pulmonary aspirate smears showed interest with single linage dysplasia.  Less than 5% bone marrow blasts and no circulating blast. Iron storage is nearly absent Peripheral blood smear showed moderate and anisopoikilocytosis Marrow aspirate normal in the neck, cells, and adenoid maturation Cytogenetics performed at the [facility] in [place] showed 46,xx, t(3;17;16)(p21;q21;q24), add (5) (q11,2),  idic(22)(p11,2)[18]/46,xx[2].  20 metaphases, lites were normal and 18 metaphases 5 q. deletion, it has translocation involving chromosome of 3, 70, 16, and iso-di centric 22.  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  Patient received IV iron infusion on moving to [place].  She has not had any treatment for MDS done due to the move.  Today patient is accompanied by her daughter to the clinic.  She reports feeling tired, no weight loss.  She reports easy bruising.  Intermittent epistaxis which spontaneously resolved after applying pressure denies any hematochezia, hematemesis, hemoptysis.    Review of Systems  Constitutional: Positive for malaise/fatigue. Negative for chills, fever and weight loss.  HENT: Negative for congestion, ear discharge, ear pain, nosebleeds, sinus pain and sore throat.   Eyes: Negative for double vision, photophobia, pain, discharge and redness.  Respiratory: Negative for cough, hemoptysis, sputum production, shortness of breath and wheezing.   Cardiovascular: Negative for chest pain, palpitations, orthopnea, claudication and leg swelling.  Gastrointestinal: Negative for abdominal pain, blood in stool, constipation, diarrhea, heartburn, melena, nausea and vomiting.  Genitourinary: Negative for dysuria, flank pain, frequency and hematuria.  Musculoskeletal: Negative for back pain, myalgias and neck pain.  Skin: Negative for itching and rash.  Neurological: Negative for dizziness, tingling, tremors, focal weakness, weakness and headaches.  Endo/Heme/Allergies: Negative for environmental allergies. Does not bruise/bleed easily.  Psychiatric/Behavioral: Negative for depression and hallucinations. The patient is not nervous/anxious.     MEDICAL HISTORY:  Past Medical History:  Diagnosis Date  . Anemia   . Clotting disorder ([place])   . Diabetes mellitus without complication ([place])   . IBS (irritable  bowel syndrome)   . MDS (myelodysplastic syndrome) ([place])   . Thyroid  disease     SURGICAL HISTORY: Past Surgical History:  Procedure Laterality Date  . APPENDECTOMY    . breast biopsy 85'    . CHOLECYSTECTOMY    . stent/heart 55'      SOCIAL HISTORY: Social History   Socioeconomic History  . Marital status: Married    Spouse name: Not on file  . Number of children: Not on file  . Years of education: Not on file  . Highest education level: Not on file  Occupational History  . Occupation: retired  Scientific laboratory technician  . Financial resource strain: Not on file  . Food insecurity:    Worry: Not on file    Inability: Not on file  . Transportation needs:    Medical: Not on file    Non-medical: Not on file  Tobacco Use  . Smoking status: Former Smoker    Packs/day: 1.00    Types: Cigarettes    Last attempt to quit: 1999    Years since quitting: 20.4  . Smokeless tobacco: Never Used  Substance and Sexual Activity  . Alcohol use: Not Currently  . Drug use: Never  . Sexual activity: Yes  Lifestyle  . Physical activity:    Days per week: Not on file    Minutes per session: Not on file  . Stress: Not on file  Relationships  . Social connections:    Talks on phone: Not on file    Gets together: Not on file    Attends religious service: Not on file    Active member of club or organization: Not on file    Attends meetings of clubs or organizations: Not on file    Relationship status: Not on file  . Intimate partner violence:    Fear of current or ex partner: Not on file    Emotionally abused: Not on file    Physically abused: Not on file    Forced sexual activity: Not on file  Other Topics Concern  . Not on file  Social History Narrative  . Not on file    FAMILY HISTORY: Family History  Adopted: Yes  Family history unknown: Yes    ALLERGIES:  is allergic to acarbose; demerol [meperidine hcl]; meperidine; and pioglitazone.  MEDICATIONS:  Current Outpatient  Medications  Medication Sig Dispense Refill  . atorvastatin (LIPITOR) 40 MG tablet Take 40 mg by mouth daily.  3  . furosemide (LASIX) 20 MG tablet Take 20 mg by mouth daily.  3  . glimepiride (AMARYL) 4 MG tablet Take 4 mg by mouth daily.  3  . levothyroxine (SYNTHROID, LEVOTHROID) 50 MCG tablet levothyroxine 50 mcg tablet    . NOVOLOG MIX 70/30 FLEXPEN (70-30) 100 UNIT/ML FlexPen INJECT 10 UNITS UNDER THE SKIN TWO TIMES A DAY  3  . nystatin (MYCOSTATIN/NYSTOP) powder nystatin 100,000 unit/gram topical powder   1 app by topical route.    . ONE TOUCH ULTRA TEST test strip USE TWO TIMES A DAY TO CHECK BLOOD SUGAR  6  . pantoprazole (PROTONIX) 40 MG tablet Take 40 mg by mouth daily.  3  . quinapril (ACCUPRIL) 20 MG tablet quinapril 20 mg tablet    . sulfaSALAzine (AZULFIDINE) 500 MG EC tablet sulfasalazine 500 mg tablet,delayed release     No current facility-administered medications for this visit.      PHYSICAL EXAMINATION: ECOG PERFORMANCE STATUS: 1 - Symptomatic but completely ambulatory Vitals:   11/28/17 1404  BP: (!) 129/56  Pulse: 63  Resp: 18  Temp: (!)  97 F (36.1 C)   Filed Weights   11/28/17 1404  Weight: 153 lb 9 oz (69.7 kg)    Physical Exam  Constitutional: She is oriented to person, place, and time. She appears well-developed and well-nourished. No distress.  HENT:  Head: Normocephalic and atraumatic.  Right Ear: External ear normal.  Left Ear: External ear normal.  Mouth/Throat: Oropharynx is clear and moist.  Eyes: Pupils are equal, round, and reactive to light. EOM are normal. No scleral icterus.  Pale conjunctivae.   Neck: Normal range of motion. Neck supple.  Cardiovascular: Normal rate, regular rhythm and normal heart sounds.  Pulmonary/Chest: Effort normal and breath sounds normal. No respiratory distress. She has no wheezes. She has no rales. She exhibits no tenderness.  Abdominal: Soft. Bowel sounds are normal. She exhibits no distension and no  mass. There is no tenderness.  Musculoskeletal: Normal range of motion. She exhibits no edema or deformity.  Lymphadenopathy:    She has no cervical adenopathy.  Neurological: She is alert and oriented to person, place, and time. No cranial nerve deficit. Coordination normal.  Skin: Skin is warm and dry. No rash noted.  Psychiatric: She has a normal mood and affect. Her behavior is normal. Thought content normal.     LABORATORY DATA:  I have reviewed the data as listed Lab Results  Component Value Date   WBC 3.0 (L) 11/28/2017   HGB 10.2 (L) 11/28/2017   HCT 31.2 (L) 11/28/2017   MCV 87.4 11/28/2017   PLT 24 (LL) 11/28/2017   Recent Labs    11/28/17 1512  NA 138  K 4.0  CL 106  CO2 25  GLUCOSE 101*  BUN 17  CREATININE 1.04*  CALCIUM 8.7*  GFRNONAA 50*  GFRAA 58*  PROT 6.6  ALBUMIN 3.5  AST 22  ALT 17  ALKPHOS 110  BILITOT 0.8       ASSESSMENT & PLAN:  1. MDS (myelodysplastic syndrome) with 5q deletion ([place])   2. Iron deficiency anemia due to chronic blood loss   3. Thrombocytopenia ([place])    I had a lengthy discussion with patient and her daughter. Discussed about diagnosis of MDS and prognosis.   MDS IPSS-R high risk, mainly due to more than 3 cytogenetics abnormality. Although patient does have 5q deletion which if isolated, is a good cytogentic group, she carried other cytogentic abnormalities which increases her IPSS-R score.   Check cbc, cmp, iron tibc, ferritin, smear review.  Goal of care discussion: discussed that MDS has no cure at patient's age, the mainstay of treatment is supportive care +/- chemotherapy with palliative intent.  Although revlimid can be used to treat patient with 5q deletion +/- one cytogenetic abnormality, patient has complex cytogenetics and have platelet counts <50,000,  revlimid is not indicated in her case.  Plan Starting Vidiza, will discuss with patient at next visit. .   Labs reviewed. She has severe thrombocytopenia with  platelet of 24,000, anemia with hemoglobin of 10.2, ANC 1.2.  Currently no bleeding, no need to transfuse platelet.  Weekly cbc, hold tube to blood bank, plan transfuse if active bleeding or counts <15,000.   Called patient's daughter and communicated patient's labs and treatment plan.    Patient to follow up in 2 weeks and discuss about treatment options.   All questions were answered. The patient knows to call the clinic with any problems questions or concerns.  Return of visit: 2 weeks.  Thank you for this kind referral and the opportunity  to participate in the care of this patient. A copy of today's note is routed to referring provider  Total face to face encounter time for this patient visit was 60  min. >50% of the time was  spent in counseling and coordination of care.    Earlie Server, MD, PhD Hematology Oncology [facility] Pager- [phone] 11/28/2017

## 2017-11-29 ENCOUNTER — Encounter: Payer: Self-pay | Admitting: Oncology

## 2017-11-30 ENCOUNTER — Encounter: Payer: Self-pay | Admitting: Oncology

## 2017-11-30 ENCOUNTER — Encounter: Payer: Self-pay | Admitting: Internal Medicine

## 2017-11-30 DIAGNOSIS — E1169 Type 2 diabetes mellitus with other specified complication: Secondary | ICD-10-CM | POA: Insufficient documentation

## 2017-11-30 DIAGNOSIS — H269 Unspecified cataract: Secondary | ICD-10-CM | POA: Insufficient documentation

## 2017-11-30 DIAGNOSIS — D46C Myelodysplastic syndrome with isolated del(5q) chromosomal abnormality: Secondary | ICD-10-CM | POA: Insufficient documentation

## 2017-11-30 DIAGNOSIS — R609 Edema, unspecified: Secondary | ICD-10-CM | POA: Insufficient documentation

## 2017-11-30 DIAGNOSIS — E785 Hyperlipidemia, unspecified: Secondary | ICD-10-CM

## 2017-11-30 DIAGNOSIS — M1990 Unspecified osteoarthritis, unspecified site: Secondary | ICD-10-CM | POA: Insufficient documentation

## 2017-11-30 DIAGNOSIS — I10 Essential (primary) hypertension: Secondary | ICD-10-CM | POA: Insufficient documentation

## 2017-11-30 DIAGNOSIS — K5730 Diverticulosis of large intestine without perforation or abscess without bleeding: Secondary | ICD-10-CM | POA: Insufficient documentation

## 2017-11-30 DIAGNOSIS — J45909 Unspecified asthma, uncomplicated: Secondary | ICD-10-CM | POA: Insufficient documentation

## 2017-11-30 DIAGNOSIS — K222 Esophageal obstruction: Secondary | ICD-10-CM | POA: Insufficient documentation

## 2017-12-04 ENCOUNTER — Telehealth: Payer: Self-pay | Admitting: *Deleted

## 2017-12-04 ENCOUNTER — Inpatient Hospital Stay: Payer: Medicare Other

## 2017-12-04 DIAGNOSIS — D46C Myelodysplastic syndrome with isolated del(5q) chromosomal abnormality: Secondary | ICD-10-CM

## 2017-12-04 DIAGNOSIS — K625 Hemorrhage of anus and rectum: Secondary | ICD-10-CM | POA: Diagnosis not present

## 2017-12-04 DIAGNOSIS — D696 Thrombocytopenia, unspecified: Secondary | ICD-10-CM | POA: Diagnosis not present

## 2017-12-04 DIAGNOSIS — D500 Iron deficiency anemia secondary to blood loss (chronic): Secondary | ICD-10-CM | POA: Diagnosis not present

## 2017-12-04 DIAGNOSIS — R5383 Other fatigue: Secondary | ICD-10-CM | POA: Diagnosis not present

## 2017-12-04 DIAGNOSIS — Q2733 Arteriovenous malformation of digestive system vessel: Secondary | ICD-10-CM | POA: Diagnosis not present

## 2017-12-04 LAB — CBC WITH DIFFERENTIAL/PLATELET
BAND NEUTROPHILS: 5 %
BLASTS: 0 %
Basophils Absolute: 0 10*3/uL (ref 0–0.1)
Basophils Relative: 1 %
Eosinophils Absolute: 0.1 10*3/uL (ref 0–0.7)
Eosinophils Relative: 3 %
HEMATOCRIT: 30.9 % — AB (ref 35.0–47.0)
Hemoglobin: 10 g/dL — ABNORMAL LOW (ref 12.0–16.0)
LYMPHS PCT: 18 %
Lymphs Abs: 0.5 10*3/uL — ABNORMAL LOW (ref 1.0–3.6)
MCH: 28.4 pg (ref 26.0–34.0)
MCHC: 32.4 g/dL (ref 32.0–36.0)
MCV: 87.7 fL (ref 80.0–100.0)
MONOS PCT: 16 %
Metamyelocytes Relative: 0 %
Monocytes Absolute: 0.5 10*3/uL (ref 0.2–0.9)
Myelocytes: 2 %
NEUTROS ABS: 1.9 10*3/uL (ref 1.4–6.5)
NEUTROS PCT: 55 %
NRBC: 0 /100{WBCs}
OTHER: 0 %
PROMYELOCYTES RELATIVE: 0 %
Platelets: 25 10*3/uL — CL (ref 150–440)
RBC: 3.53 MIL/uL — AB (ref 3.80–5.20)
RDW: 22.7 % — ABNORMAL HIGH (ref 11.5–14.5)
WBC: 3 10*3/uL — ABNORMAL LOW (ref 3.6–11.0)

## 2017-12-04 LAB — SAMPLE TO BLOOD BANK

## 2017-12-04 NOTE — Telephone Encounter (Signed)
Dr, Tasia Catchings say platelet count is stable

## 2017-12-04 NOTE — Telephone Encounter (Signed)
Lab called and Platelet count is 25

## 2017-12-06 ENCOUNTER — Encounter: Payer: Self-pay | Admitting: Oncology

## 2017-12-12 ENCOUNTER — Inpatient Hospital Stay: Payer: Medicare Other

## 2017-12-12 ENCOUNTER — Inpatient Hospital Stay: Payer: Medicare Other | Attending: Oncology | Admitting: Oncology

## 2017-12-12 ENCOUNTER — Encounter: Payer: Self-pay | Admitting: Oncology

## 2017-12-12 VITALS — BP 121/69 | HR 65 | Temp 97.1°F | Resp 18 | Ht 64.0 in | Wt 152.5 lb

## 2017-12-12 DIAGNOSIS — K760 Fatty (change of) liver, not elsewhere classified: Secondary | ICD-10-CM | POA: Insufficient documentation

## 2017-12-12 DIAGNOSIS — D46C Myelodysplastic syndrome with isolated del(5q) chromosomal abnormality: Secondary | ICD-10-CM

## 2017-12-12 DIAGNOSIS — Z5111 Encounter for antineoplastic chemotherapy: Secondary | ICD-10-CM | POA: Diagnosis not present

## 2017-12-12 DIAGNOSIS — Z794 Long term (current) use of insulin: Secondary | ICD-10-CM | POA: Diagnosis not present

## 2017-12-12 DIAGNOSIS — Q2733 Arteriovenous malformation of digestive system vessel: Secondary | ICD-10-CM | POA: Insufficient documentation

## 2017-12-12 DIAGNOSIS — E119 Type 2 diabetes mellitus without complications: Secondary | ICD-10-CM | POA: Insufficient documentation

## 2017-12-12 DIAGNOSIS — R5383 Other fatigue: Secondary | ICD-10-CM

## 2017-12-12 DIAGNOSIS — D500 Iron deficiency anemia secondary to blood loss (chronic): Secondary | ICD-10-CM | POA: Diagnosis not present

## 2017-12-12 DIAGNOSIS — R5381 Other malaise: Secondary | ICD-10-CM | POA: Insufficient documentation

## 2017-12-12 DIAGNOSIS — Z79899 Other long term (current) drug therapy: Secondary | ICD-10-CM | POA: Diagnosis not present

## 2017-12-12 DIAGNOSIS — Z87891 Personal history of nicotine dependence: Secondary | ICD-10-CM | POA: Diagnosis not present

## 2017-12-12 DIAGNOSIS — Z7189 Other specified counseling: Secondary | ICD-10-CM | POA: Insufficient documentation

## 2017-12-12 DIAGNOSIS — D696 Thrombocytopenia, unspecified: Secondary | ICD-10-CM | POA: Diagnosis not present

## 2017-12-12 HISTORY — DX: Iron deficiency anemia secondary to blood loss (chronic): D50.0

## 2017-12-12 LAB — CBC WITH DIFFERENTIAL/PLATELET
Basophils Absolute: 0.1 K/uL (ref 0–0.1)
Basophils Relative: 2 %
Eosinophils Absolute: 0 K/uL (ref 0–0.7)
Eosinophils Relative: 1 %
HCT: 32.7 % — ABNORMAL LOW (ref 35.0–47.0)
Hemoglobin: 10.4 g/dL — ABNORMAL LOW (ref 12.0–16.0)
Lymphocytes Relative: 35 %
Lymphs Abs: 1.2 K/uL (ref 1.0–3.6)
MCH: 28.2 pg (ref 26.0–34.0)
MCHC: 31.8 g/dL — ABNORMAL LOW (ref 32.0–36.0)
MCV: 88.5 fL (ref 80.0–100.0)
Monocytes Absolute: 0.7 K/uL (ref 0.2–0.9)
Monocytes Relative: 20 %
Neutro Abs: 1.4 K/uL (ref 1.4–6.5)
Neutrophils Relative %: 42 %
Platelets: 34 K/uL — ABNORMAL LOW (ref 150–440)
RBC: 3.7 MIL/uL — ABNORMAL LOW (ref 3.80–5.20)
RDW: 23.1 % — ABNORMAL HIGH (ref 11.5–14.5)
WBC: 3.4 K/uL — ABNORMAL LOW (ref 3.6–11.0)

## 2017-12-12 LAB — SAMPLE TO BLOOD BANK

## 2017-12-12 NOTE — Progress Notes (Signed)
Pt in for follow up with daughter. Reports adjusting well to [facility] and [place]. No active bleeding noted.

## 2017-12-12 NOTE — Progress Notes (Signed)
Hematology/Oncology Follow up note [facility] Telephone:([phone] [phone]   Patient Care Team: Glean Hess, MD as PCP - General (Internal Medicine)  REFERRING PROVIDER: Glean Hess, MD  Previous Oncologist Dr.Srujitha Murkutla REASON FOR VISIT Follow up for treatment of MDS  HISTORY OF PRESENTING ILLNESS:  Yesenia Hart is a  77 y.o.  female with PMH listed below who was referred to me for evaluation of thrombocytopenia.   Recently moved from [place] to [place].  She is living at [place] with husband.  Accompanied by daughter, [name] who is a respiratory therapist at [employer].  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  Patient presented to emergency room in January 2019 with complaints of intermittent rectal bleeding and increased to have a hemoglobin of 3 and platelet counts was 59,000.  Received 5 units of PRBC and was discharged home with hemoglobin of 10.  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  Cauterization was deferred due to thrombocytopenia. Aspirin was discontinued due to GI bleeding. Peripheral blood flow, SPEP negative, no obvious signs of B12 deficiency.  Ultrasound of the abdomen showed mild fatty liver.  On October 04, 2017, patient underwent bone marrow biopsy. Bone marrow core biopsy, bone marrow aspirate clot, bone marrow aspirate smears showed  single linage dysplasia.  Less than 5% bone marrow blasts and no circulating blast. Iron storage is nearly absent Peripheral blood smear showed moderate and anisopoikilocytosis Marrow aspirate normal in  adenoid maturation Cytogenetics performed at the [facility] in [place] showed 46,xx, t(3;17;16)(p21;q21;q24), add (5) (q11,2), idic(22)(p11,2)[18]/46,xx[2].  20 metaphases, lites were  normal and 18 metaphases 5 q. deletion, it has translocation involving chromosome of 3, 17, 16, and iso-di centric 22.  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  Patient received IV iron infusion on moving to [place].  She has not had any treatment for MDS done due to the move.  Today patient is accompanied by her daughter to the clinic.  She reports feeling tired, no weight loss.  She reports easy bruising.  Intermittent epistaxis which spontaneously resolved after applying pressure denies any hematochezia, hematemesis, hemoptysis.   INTERVAL HISTORY Yesenia Hart is a 77 y.o. female who has above history reviewed by me today presents for follow up visit for management of MDS Problems and complaints are listed below: Thrombocytopenia: stable. No bleeding events.  Fatigue: reports worsening fatigue. Chronic onset, perisistent, no aggravating or improving factors, no associated symptoms.  Easy bruising: stable.   Review of Systems  Constitutional: Positive for malaise/fatigue. Negative for chills, fever and weight loss.  HENT: Negative for congestion, ear discharge, ear pain, nosebleeds, sinus pain and sore throat.   Eyes: Negative for double vision, photophobia, pain, discharge and redness.  Respiratory: Negative for cough, hemoptysis, sputum production, shortness of breath and wheezing.   Cardiovascular: Negative for chest pain, palpitations, orthopnea, claudication and leg swelling.  Gastrointestinal: Negative for abdominal pain, blood in stool, constipation, diarrhea, heartburn, melena, nausea and vomiting.  Genitourinary: Negative for dysuria, flank pain, frequency and hematuria.  Musculoskeletal: Negative for back pain, myalgias and neck pain.  Skin: Negative for itching and rash.  Neurological: Negative for dizziness, tingling, tremors, focal weakness, weakness and headaches.  Endo/Heme/Allergies: Negative for  environmental allergies. Bruises/bleeds easily.  Psychiatric/Behavioral: Negative for depression and  hallucinations. The patient is not nervous/anxious.     MEDICAL HISTORY:  Past Medical History:  Diagnosis Date  . Anemia   . Clotting disorder ([place])   . Diabetes mellitus without complication ([place])   . IBS (irritable bowel syndrome)   . MDS (myelodysplastic syndrome) ([place])   . Thyroid disease     SURGICAL HISTORY: Past Surgical History:  Procedure Laterality Date  . APPENDECTOMY    . breast biopsy 62'    . CHOLECYSTECTOMY    . stent/heart 45'      SOCIAL HISTORY: Social History   Socioeconomic History  . Marital status: Married    Spouse name: Not on file  . Number of children: Not on file  . Years of education: Not on file  . Highest education level: Not on file  Occupational History  . Occupation: retired  Scientific laboratory technician  . Financial resource strain: Not on file  . Food insecurity:    Worry: Not on file    Inability: Not on file  . Transportation needs:    Medical: Not on file    Non-medical: Not on file  Tobacco Use  . Smoking status: Former Smoker    Packs/day: 1.00    Types: Cigarettes    Last attempt to quit: 1999    Years since quitting: 20.4  . Smokeless tobacco: Never Used  Substance and Sexual Activity  . Alcohol use: Not Currently  . Drug use: Never  . Sexual activity: Yes  Lifestyle  . Physical activity:    Days per week: Not on file    Minutes per session: Not on file  . Stress: Not on file  Relationships  . Social connections:    Talks on phone: Not on file    Gets together: Not on file    Attends religious service: Not on file    Active member of club or organization: Not on file    Attends meetings of clubs or organizations: Not on file    Relationship status: Not on file  . Intimate partner violence:    Fear of current or ex partner: Not on file    Emotionally abused: Not on file    Physically abused: Not on file    Forced sexual  activity: Not on file  Other Topics Concern  . Not on file  Social History Narrative  . Not on file    FAMILY HISTORY: Family History  Adopted: Yes  Family history unknown: Yes    ALLERGIES:  is allergic to acarbose; demerol [meperidine hcl]; meperidine; and pioglitazone.  MEDICATIONS:  Current Outpatient Medications  Medication Sig Dispense Refill  . atorvastatin (LIPITOR) 40 MG tablet Take 40 mg by mouth daily.  3  . furosemide (LASIX) 20 MG tablet Take 20 mg by mouth daily.  3  . glimepiride (AMARYL) 4 MG tablet Take 4 mg by mouth daily.  3  . levothyroxine (SYNTHROID, LEVOTHROID) 50 MCG tablet levothyroxine 50 mcg tablet    . NOVOLOG MIX 70/30 FLEXPEN (70-30) 100 UNIT/ML FlexPen INJECT 10 UNITS UNDER THE SKIN TWO TIMES A DAY  3  . nystatin (MYCOSTATIN/NYSTOP) powder nystatin 100,000 unit/gram topical powder   1 app by topical route.    . ONE TOUCH ULTRA TEST test strip USE TWO TIMES A DAY TO CHECK BLOOD SUGAR  6  . pantoprazole (PROTONIX) 40 MG tablet Take 40 mg by mouth daily.  3  . quinapril (ACCUPRIL) 20 MG tablet quinapril 20 mg tablet    . sulfaSALAzine (  AZULFIDINE) 500 MG EC tablet sulfasalazine 500 mg tablet,delayed release     No current facility-administered medications for this visit.      PHYSICAL EXAMINATION: ECOG PERFORMANCE STATUS: 1 - Symptomatic but completely ambulatory Vitals:   12/12/17 1415  BP: 121/69  Pulse: 65  Resp: 18  Temp: (!) 97.1 F (36.2 C)   Filed Weights   12/12/17 1415  Weight: 152 lb 8 oz (69.2 kg)    Physical Exam  Constitutional: She is oriented to person, place, and time. She appears well-developed and well-nourished. No distress.  HENT:  Head: Normocephalic and atraumatic.  Right Ear: External ear normal.  Left Ear: External ear normal.  Mouth/Throat: Oropharynx is clear and moist.  Eyes: Pupils are equal, round, and reactive to light. Conjunctivae and EOM are normal. No scleral icterus.  Pale conjunctivae.   Neck:  Normal range of motion. Neck supple.  Cardiovascular: Normal rate, regular rhythm and normal heart sounds.  Pulmonary/Chest: Effort normal and breath sounds normal. No respiratory distress. She has no wheezes. She has no rales. She exhibits no tenderness.  Abdominal: Soft. Bowel sounds are normal. She exhibits no distension and no mass. There is no tenderness.  Musculoskeletal: Normal range of motion. She exhibits no edema or deformity.  Lymphadenopathy:    She has no cervical adenopathy.  Neurological: She is alert and oriented to person, place, and time. No cranial nerve deficit. Coordination normal.  Skin: Skin is warm and dry. No rash noted.  Bruises.   Psychiatric: She has a normal mood and affect. Her behavior is normal. Thought content normal.     LABORATORY DATA:  I have reviewed the data as listed Lab Results  Component Value Date   WBC 3.4 (L) 12/12/2017   HGB 10.4 (L) 12/12/2017   HCT 32.7 (L) 12/12/2017   MCV 88.5 12/12/2017   PLT 34 (L) 12/12/2017   Recent Labs    11/28/17 1512  NA 138  K 4.0  CL 106  CO2 25  GLUCOSE 101*  BUN 17  CREATININE 1.04*  CALCIUM 8.7*  GFRNONAA 50*  GFRAA 58*  PROT 6.6  ALBUMIN 3.5  AST 22  ALT 17  ALKPHOS 110  BILITOT 0.8       ASSESSMENT & PLAN:  1. MDS (myelodysplastic syndrome) with 5q deletion ([place])   2. Iron deficiency anemia due to chronic blood loss   3. Thrombocytopenia ([facility])   4. Other fatigue   5. Goals of care, counseling/discussion   MDS IPSS-R high risk, mainly due to more than 3 cytogenetics abnormality. Although patient does have 5q deletion which if isolated, is a good cytogentic group, she carried other cytogentic abnormalities which increases her IPSS-R score.  Discussed with patient and daughter about disease nature, prognosis. Discussed that although revlimid can be used to treat patient with 5q deletion +/- one cytogenetic abnormality, patient has complex cytogenetics and have platelet counts <50,000,   revlimid is not indicated in her case.   I explained to the patient the risks and benefits of hypoventilating agent chemotherapy with Vidaza including all but not limited to mouth sore, nausea, vomiting, low blood counts, bleeding, and risk of life threatening infection and even death, secondary malignancy etc.  Patient and daughter voice understanding and willing to proceed chemotherapy.   # Chemotherapy education;  Antiemetics-Zofran and Compazine;  Plan Vidaza D1-D5 Q28 days, starting next week.  # Goal of care: patient is not bone marrow transplant candidate. Discussed that her disease is not curable, and  goal of care is palliative intent.  # Thrombocytopenia: Labs reviewed.Platelet count today is 34,000, stable, no need for transfusion, monitor.  Weekly cbc, hold tube to blood bank.  # Fatigue: chronic, stable. Monitor.  Patient to follow up in 2 weeks and assess tolerability of chemothrapy.   All questions were answered. The patient knows to call the clinic with any problems questions or concerns. Total face to face encounter time for this patient visit was 45 min. >50% of the time was  spent in counseling and coordination of care.  Return of visit: 2 weeks.   Earlie Server, MD, PhD Hematology Oncology [facility] at [facility] Pager- [phone] 12/12/2017

## 2017-12-12 NOTE — Progress Notes (Signed)
START ON PATHWAY REGIMEN - MDS     A cycle is every 28 days:     Azacitidine   **Always confirm dose/schedule in your pharmacy ordering system**  Patient Characteristics: Higher-Risk (IPSS-R Score > 3.5), First Line, Not a Transplant Candidate WHO Disease Classification: MDS-SLD Bone Marrow Blasts (percent): > 2% to < 5% Cytogenetic Category: Very Poor Platelets (x 10^9/L): < 50 Absolute Neutrophil Count (x 10^9/L): ? 0.8 Line of Therapy: First Line IPSS-R Risk Category: High IPSS-R Risk Score: 6 Check here if patient's risk score was calculated prior to the International Prognostic Scoring System-Revised (IPSS-R): false Hemoglobin (g/dl): ? 10 Patient Characteristics: Not a Transplant Candidate Intent of Therapy: Non-Curative / Palliative Intent, Discussed with Patient

## 2017-12-13 ENCOUNTER — Inpatient Hospital Stay: Payer: Medicare Other

## 2017-12-13 MED ORDER — ONDANSETRON HCL 8 MG PO TABS: 8.0000 mg | ORAL_TABLET | Freq: Two times a day (BID) | ORAL | 1 refills | Status: DC | PRN

## 2017-12-13 MED ORDER — PROCHLORPERAZINE MALEATE 10 MG PO TABS: 10.0000 mg | ORAL_TABLET | Freq: Four times a day (QID) | ORAL | 1 refills | Status: DC | PRN

## 2017-12-13 NOTE — Patient Instructions (Signed)
Azacitidine suspension for injection (subcutaneous use) What is this medicine? AZACITIDINE (ay za SITE i deen) is a chemotherapy drug. This medicine reduces the growth of cancer cells and can suppress the immune system. It is used for treating myelodysplastic syndrome or some types of leukemia. This medicine may be used for other purposes; ask your health care provider or pharmacist if you have questions. COMMON BRAND NAME(S): Vidaza What should I tell my health care provider before I take this medicine? They need to know if you have any of these conditions: -kidney disease -liver disease -liver tumors -an unusual or allergic reaction to azacitidine, mannitol, other medicines, foods, dyes, or preservatives -pregnant or trying to get pregnant -breast-feeding How should I use this medicine? This medicine is for injection under the skin. It is administered in a hospital or clinic by a specially trained health care professional. Talk to your pediatrician regarding the use of this medicine in children. While this drug may be prescribed for selected conditions, precautions do apply. Overdosage: If you think you have taken too much of this medicine contact a poison control center or emergency room at once. NOTE: This medicine is only for you. Do not share this medicine with others. What if I miss a dose? It is important not to miss your dose. Call your doctor or health care professional if you are unable to keep an appointment. What may interact with this medicine? Interactions have not been studied. Give your health care provider a list of all the medicines, herbs, non-prescription drugs, or dietary supplements you use. Also tell them if you smoke, drink alcohol, or use illegal drugs. Some items may interact with your medicine. This list may not describe all possible interactions. Give your health care provider a list of all the medicines, herbs, non-prescription drugs, or dietary supplements you  use. Also tell them if you smoke, drink alcohol, or use illegal drugs. Some items may interact with your medicine. What should I watch for while using this medicine? Visit your doctor for checks on your progress. This drug may make you feel generally unwell. This is not uncommon, as chemotherapy can affect healthy cells as well as cancer cells. Report any side effects. Continue your course of treatment even though you feel ill unless your doctor tells you to stop. In some cases, you may be given additional medicines to help with side effects. Follow all directions for their use. Call your doctor or health care professional for advice if you get a fever, chills or sore throat, or other symptoms of a cold or flu. Do not treat yourself. This drug decreases your body's ability to fight infections. Try to avoid being around people who are sick. This medicine may increase your risk to bruise or bleed. Call your doctor or health care professional if you notice any unusual bleeding. You may need blood work done while you are taking this medicine. Do not become pregnant while taking this medicine and for 6 months after the last dose. Women should inform their doctor if they wish to become pregnant or think they might be pregnant. Men should not father a child while taking this medicine and for 3 months after the last dose. There is a potential for serious side effects to an unborn child. Talk to your health care professional or pharmacist for more information. Do not breast-feed an infant while taking this medicine and for 1 week after the last dose. This medicine may interfere with the ability to have a child.   Talk with your doctor or health care professional if you are concerned about your fertility. What side effects may I notice from receiving this medicine? Side effects that you should report to your doctor or health care professional as soon as possible: -allergic reactions like skin rash, itching or hives,  swelling of the face, lips, or tongue -low blood counts - this medicine may decrease the number of white blood cells, red blood cells and platelets. You may be at increased risk for infections and bleeding. -signs of infection - fever or chills, cough, sore throat, pain passing urine -signs of decreased platelets or bleeding - bruising, pinpoint red spots on the skin, black, tarry stools, blood in the urine -signs of decreased red blood cells - unusually weak or tired, fainting spells, lightheadedness -signs and symptoms of kidney injury like trouble passing urine or change in the amount of urine -signs and symptoms of liver injury like dark yellow or brown urine; general ill feeling or flu-like symptoms; light-colored stools; loss of appetite; nausea; right upper belly pain; unusually weak or tired; yellowing of the eyes or skin Side effects that usually do not require medical attention (report to your doctor or health care professional if they continue or are bothersome): -constipation -diarrhea -nausea, vomiting -pain or redness at the injection site -unusually weak or tired This list may not describe all possible side effects. Call your doctor for medical advice about side effects. You may report side effects to FDA at 1-800-FDA-1088. Where should I keep my medicine? This drug is given in a hospital or clinic and will not be stored at home. NOTE: This sheet is a summary. It may not cover all possible information. If you have questions about this medicine, talk to your doctor, pharmacist, or health care provider.  2018 Elsevier/Gold Standard (2016-07-25 14:37:51)  

## 2017-12-17 ENCOUNTER — Other Ambulatory Visit: Payer: Self-pay | Admitting: Internal Medicine

## 2017-12-17 ENCOUNTER — Inpatient Hospital Stay: Payer: Medicare Other

## 2017-12-17 ENCOUNTER — Encounter: Payer: Self-pay | Admitting: Internal Medicine

## 2017-12-17 ENCOUNTER — Ambulatory Visit (INDEPENDENT_AMBULATORY_CARE_PROVIDER_SITE_OTHER): Payer: Medicare Other | Admitting: Internal Medicine

## 2017-12-17 ENCOUNTER — Other Ambulatory Visit: Payer: Self-pay

## 2017-12-17 VITALS — BP 116/70 | HR 81 | Temp 98.6°F | Resp 16 | Ht 61.75 in | Wt 154.0 lb

## 2017-12-17 VITALS — BP 138/71 | HR 94 | Temp 95.6°F | Resp 18 | Wt 152.2 lb

## 2017-12-17 DIAGNOSIS — E034 Atrophy of thyroid (acquired): Secondary | ICD-10-CM | POA: Diagnosis not present

## 2017-12-17 DIAGNOSIS — E782 Mixed hyperlipidemia: Secondary | ICD-10-CM | POA: Diagnosis not present

## 2017-12-17 DIAGNOSIS — D46C Myelodysplastic syndrome with isolated del(5q) chromosomal abnormality: Secondary | ICD-10-CM

## 2017-12-17 DIAGNOSIS — B001 Herpesviral vesicular dermatitis: Secondary | ICD-10-CM

## 2017-12-17 DIAGNOSIS — Q2733 Arteriovenous malformation of digestive system vessel: Secondary | ICD-10-CM | POA: Diagnosis not present

## 2017-12-17 DIAGNOSIS — Z794 Long term (current) use of insulin: Secondary | ICD-10-CM | POA: Diagnosis not present

## 2017-12-17 DIAGNOSIS — D696 Thrombocytopenia, unspecified: Secondary | ICD-10-CM | POA: Diagnosis not present

## 2017-12-17 DIAGNOSIS — R5383 Other fatigue: Secondary | ICD-10-CM | POA: Diagnosis not present

## 2017-12-17 DIAGNOSIS — E119 Type 2 diabetes mellitus without complications: Secondary | ICD-10-CM | POA: Diagnosis not present

## 2017-12-17 DIAGNOSIS — J449 Chronic obstructive pulmonary disease, unspecified: Secondary | ICD-10-CM | POA: Diagnosis not present

## 2017-12-17 DIAGNOSIS — I10 Essential (primary) hypertension: Secondary | ICD-10-CM | POA: Diagnosis not present

## 2017-12-17 DIAGNOSIS — F17201 Nicotine dependence, unspecified, in remission: Secondary | ICD-10-CM | POA: Diagnosis not present

## 2017-12-17 DIAGNOSIS — Z5111 Encounter for antineoplastic chemotherapy: Secondary | ICD-10-CM | POA: Diagnosis not present

## 2017-12-17 DIAGNOSIS — R600 Localized edema: Secondary | ICD-10-CM

## 2017-12-17 DIAGNOSIS — D500 Iron deficiency anemia secondary to blood loss (chronic): Secondary | ICD-10-CM | POA: Diagnosis not present

## 2017-12-17 DIAGNOSIS — E1129 Type 2 diabetes mellitus with other diabetic kidney complication: Secondary | ICD-10-CM | POA: Insufficient documentation

## 2017-12-17 DIAGNOSIS — E118 Type 2 diabetes mellitus with unspecified complications: Secondary | ICD-10-CM | POA: Insufficient documentation

## 2017-12-17 LAB — CBC WITH DIFFERENTIAL/PLATELET
BASOS PCT: 1 %
Basophils Absolute: 0 10*3/uL (ref 0–0.1)
EOS PCT: 2 %
Eosinophils Absolute: 0.1 10*3/uL (ref 0–0.7)
HCT: 31.8 % — ABNORMAL LOW (ref 35.0–47.0)
Hemoglobin: 10.3 g/dL — ABNORMAL LOW (ref 12.0–16.0)
Lymphocytes Relative: 20 %
Lymphs Abs: 0.7 10*3/uL — ABNORMAL LOW (ref 1.0–3.6)
MCH: 28.7 pg (ref 26.0–34.0)
MCHC: 32.4 g/dL (ref 32.0–36.0)
MCV: 88.6 fL (ref 80.0–100.0)
MONO ABS: 0.6 10*3/uL (ref 0.2–0.9)
Monocytes Relative: 17 %
NEUTROS ABS: 2.2 10*3/uL (ref 1.4–6.5)
NEUTROS PCT: 60 %
PLATELETS: 27 10*3/uL — AB (ref 150–400)
RBC: 3.59 MIL/uL — ABNORMAL LOW (ref 3.80–5.20)
RDW: 23.2 % — ABNORMAL HIGH (ref 11.5–14.5)
WBC: 3.6 10*3/uL — ABNORMAL LOW (ref 4.0–10.5)

## 2017-12-17 LAB — COMPREHENSIVE METABOLIC PANEL
ALBUMIN: 3.4 g/dL — AB (ref 3.5–5.0)
ALT: 20 U/L (ref 14–54)
AST: 30 U/L (ref 15–41)
Alkaline Phosphatase: 123 U/L (ref 38–126)
Anion gap: 8 (ref 5–15)
BUN: 19 mg/dL (ref 6–20)
CO2: 23 mmol/L (ref 22–32)
CREATININE: 1.14 mg/dL — AB (ref 0.44–1.00)
Calcium: 8.7 mg/dL — ABNORMAL LOW (ref 8.9–10.3)
Chloride: 107 mmol/L (ref 101–111)
GFR calc non Af Amer: 45 mL/min — ABNORMAL LOW (ref >60)
GFR, EST AFRICAN AMERICAN: 52 mL/min — AB (ref >60)
GLUCOSE: 206 mg/dL — AB (ref 65–99)
Potassium: 4 mmol/L (ref 3.5–5.1)
SODIUM: 138 mmol/L (ref 135–145)
Total Bilirubin: 0.7 mg/dL (ref 0.3–1.2)
Total Protein: 6.3 g/dL — ABNORMAL LOW (ref 6.5–8.1)

## 2017-12-17 LAB — SAMPLE TO BLOOD BANK

## 2017-12-17 MED ORDER — SULFASALAZINE 500 MG PO TBEC: 500.0000 mg | DELAYED_RELEASE_TABLET | Freq: Every day | ORAL | 1 refills | Status: DC

## 2017-12-17 MED ORDER — ONDANSETRON HCL 4 MG PO TABS
8.0000 mg | ORAL_TABLET | Freq: Once | ORAL | Status: AC
  Administered 2017-12-17: 8 mg via ORAL
  Filled 2017-12-17: qty 2

## 2017-12-17 MED ORDER — NOVOLOG MIX 70/30 FLEXPEN (70-30) 100 UNIT/ML ~~LOC~~ SUPN: 10.0000 [IU] | PEN_INJECTOR | Freq: Two times a day (BID) | SUBCUTANEOUS | 1 refills | Status: DC

## 2017-12-17 MED ORDER — ONETOUCH ULTRA BLUE VI STRP: 1.0000 | ORAL_STRIP | Freq: Two times a day (BID) | 1 refills | Status: DC

## 2017-12-17 MED ORDER — LEVOTHYROXINE SODIUM 50 MCG PO TABS: 50.0000 ug | ORAL_TABLET | Freq: Every day | ORAL | 1 refills | Status: DC

## 2017-12-17 MED ORDER — GLIMEPIRIDE 4 MG PO TABS: 4.0000 mg | ORAL_TABLET | Freq: Every day | ORAL | 1 refills | Status: DC

## 2017-12-17 MED ORDER — PANTOPRAZOLE SODIUM 40 MG PO TBEC: 40.0000 mg | DELAYED_RELEASE_TABLET | Freq: Every day | ORAL | 1 refills | Status: DC

## 2017-12-17 MED ORDER — FUROSEMIDE 20 MG PO TABS: 20.0000 mg | ORAL_TABLET | Freq: Every day | ORAL | 3 refills | Status: DC

## 2017-12-17 MED ORDER — QUINAPRIL HCL 20 MG PO TABS: 20.0000 mg | ORAL_TABLET | Freq: Every day | ORAL | 1 refills | Status: DC

## 2017-12-17 MED ORDER — ATORVASTATIN CALCIUM 40 MG PO TABS: 40.0000 mg | ORAL_TABLET | Freq: Every day | ORAL | 1 refills | Status: DC

## 2017-12-17 MED ORDER — AZACITIDINE CHEMO SQ INJECTION
75.0000 mg/m2 | Freq: Once | INTRAMUSCULAR | Status: AC
  Administered 2017-12-17: 132.5 mg via SUBCUTANEOUS
  Filled 2017-12-17: qty 5.3

## 2017-12-17 NOTE — Progress Notes (Signed)
Sore noted on lower lip. Pt states it has been there for at least 3 days and she is not sure where it came from and that she has never had one like it before. Dr Tasia Catchings aware and at chairside to assess. Per Dr. Tasia Catchings okay to proceed with treatment, no further orders at this time.  Pt tolerated treatment well. Pt stable at discharge.

## 2017-12-17 NOTE — Progress Notes (Signed)
Date:  12/17/2017   Name:  Yesenia Hart   DOB:  1940-09-11   MRN:  [id number]  Patient recently moved here from [place].  Residing at [facility].  Daughter Yesenia Hart.  Chief Complaint: Establish Care Diabetes  She presents for her follow-up diabetic visit. She has type 2 diabetes mellitus. Her disease course has been stable. Pertinent negatives for hypoglycemia include no dizziness, headaches or tremors. Pertinent negatives for diabetes include no chest pain, no fatigue, no polydipsia, no polyuria, no weakness and no weight loss. Diabetic complications include peripheral neuropathy (she may have some mild neuropathy but not persistent). Current diabetic treatment includes insulin injections and oral agent (monotherapy) (70/30 bid). She is compliant with treatment all of the time. Her weight is stable. She monitors blood glucose at home 1-2 x per day. Her breakfast blood glucose is taken between 6-7 am. Her breakfast blood glucose range is generally 90-110 mg/dl. Her bedtime blood glucose is taken between 8-9 pm. Her bedtime blood glucose range is generally 110-130 mg/dl. An ACE inhibitor/angiotensin II receptor blocker is being taken. Eye exam is not current.  Hypertension  This is a chronic problem. Pertinent negatives include no chest pain, headaches, palpitations or shortness of breath. Past treatments include ACE inhibitors. The current treatment provides moderate improvement. Identifiable causes of hypertension include a thyroid problem.  Hyperlipidemia  This is a chronic problem. The problem is controlled. Exacerbating diseases include diabetes and hypothyroidism. Pertinent negatives include no chest pain or shortness of breath. Current antihyperlipidemic treatment includes statins. There are no compliance problems.   Thyroid Problem  Presents for follow-up visit. Patient reports no fatigue, leg swelling, palpitations, tremors or weight loss. The symptoms  have been stable. Her past medical history is significant for diabetes and hyperlipidemia.   Bowel issues - had bout of diverticulitis several years ago.  Issues with constipation and diarrhea so was placed on sulfazalazine and then later protonix.  She has done well on this regimen and wants to continue.  Myelodysplastic syndrome - seen by Oncology and started treatment today.  Having labs done next Monday.  Plan Vidaza D1-5 every 28 days - first infusion done today.   Review of Systems  Constitutional: Negative for appetite change, fatigue, fever, unexpected weight change and weight loss.  HENT: Negative for tinnitus and trouble swallowing.   Eyes: Negative for visual disturbance.  Respiratory: Negative for cough, chest tightness and shortness of breath.   Cardiovascular: Positive for leg swelling. Negative for chest pain and palpitations.  Gastrointestinal: Negative for abdominal pain.  Endocrine: Negative for polydipsia and polyuria.  Genitourinary: Negative for dysuria and hematuria.  Musculoskeletal: Negative for arthralgias.  Skin: Negative for color change and rash.  Neurological: Negative for dizziness, tremors, weakness, numbness and headaches.  Psychiatric/Behavioral: Negative for dysphoric mood and sleep disturbance.    Patient Active Problem List   Diagnosis Date Noted  . Thrombocytopenia ([place]) 12/12/2017  . Goals of care, counseling/discussion 12/12/2017  . Other fatigue 12/12/2017  . Asthma 11/30/2017  . Cataract 11/30/2017  . Chronic obstructive lung disease ([place]) 11/30/2017  . 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 Edema 11/30/2017  . Essential hypertension 11/30/2017  . Hypothyroidism 11/30/2017  . Mixed hyperlipidemia 11/30/2017  . Osteoarthritis 11/30/2017  . 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 Authorizing  Provider  atorvastatin (LIPITOR) 40 MG  tablet Take 40 mg by mouth daily. 09/15/17  Yes [provider]  furosemide (LASIX) 20 MG tablet Take 20 mg by mouth daily. 11/03/17  Yes [provider]  glimepiride (AMARYL) 4 MG tablet Take 4 mg by mouth daily. 11/20/17  Yes [provider]  levothyroxine (SYNTHROID, LEVOTHROID) 50 MCG tablet levothyroxine 50 mcg tablet   Yes [provider]  NOVOLOG MIX 70/30 FLEXPEN (70-30) 100 UNIT/ML FlexPen INJECT 10 UNITS UNDER THE SKIN TWO TIMES A DAY 11/04/17  Yes [provider]  nystatin (MYCOSTATIN/NYSTOP) powder nystatin 100,000 unit/gram topical powder   1 app by topical route. 08/09/17  Yes [provider]  ondansetron (ZOFRAN) 8 MG tablet Take 1 tablet (8 mg total) by mouth 2 (two) times daily as needed (Nausea or vomiting). 12/13/17  Yes Earlie Server, MD  ONE TOUCH ULTRA TEST test strip USE TWO TIMES A DAY TO CHECK BLOOD SUGAR 09/28/17  Yes [provider]  pantoprazole (PROTONIX) 40 MG tablet Take 40 mg by mouth daily. 11/23/17  Yes [provider]  prochlorperazine (COMPAZINE) 10 MG tablet Take 1 tablet (10 mg total) by mouth every 6 (six) hours as needed (Nausea or vomiting). 12/13/17  Yes Earlie Server, MD  quinapril (ACCUPRIL) 20 MG tablet quinapril 20 mg tablet   Yes [provider]  sulfaSALAzine (AZULFIDINE) 500 MG EC tablet sulfasalazine 500 mg tablet,delayed release   Yes [provider]  ULTICARE MICRO PEN NEEDLES 32G X 4 MM [place]  12/14/17  Yes [provider]    Allergies  Allergen Reactions  . Acarbose Diarrhea  . Demerol [Meperidine Hcl]   . Meperidine Nausea And Vomiting  . Pioglitazone     Other reaction(s): Edema    Past Surgical History:  Procedure Laterality Date  . APPENDECTOMY    . breast biopsy 38'    . CHOLECYSTECTOMY    . stent/heart 96'      Social History   Tobacco Use  . Smoking status: Former Smoker    Packs/day: 1.00    Types:  Cigarettes    Last attempt to quit: 1999    Years since quitting: 20.4  . Smokeless tobacco: Never Used  Substance Use Topics  . Alcohol use: Not Currently  . Drug use: Never     Medication list has been reviewed and updated.  Current Meds  Medication Sig  . atorvastatin (LIPITOR) 40 MG tablet Take 40 mg by mouth daily.  . furosemide (LASIX) 20 MG tablet Take 20 mg by mouth daily.  Marland Kitchen glimepiride (AMARYL) 4 MG tablet Take 4 mg by mouth daily.  Marland Kitchen levothyroxine (SYNTHROID, LEVOTHROID) 50 MCG tablet levothyroxine 50 mcg tablet  . NOVOLOG MIX 70/30 FLEXPEN (70-30) 100 UNIT/ML FlexPen INJECT 10 UNITS UNDER THE SKIN TWO TIMES A DAY  . nystatin (MYCOSTATIN/NYSTOP) powder nystatin 100,000 unit/gram topical powder   1 app by topical route.  . ondansetron (ZOFRAN) 8 MG tablet Take 1 tablet (8 mg total) by mouth 2 (two) times daily as needed (Nausea or vomiting).  . ONE TOUCH ULTRA TEST test strip USE TWO TIMES A DAY TO CHECK BLOOD SUGAR  . pantoprazole (PROTONIX) 40 MG tablet Take 40 mg by mouth daily.  . prochlorperazine (COMPAZINE) 10 MG tablet Take 1 tablet (10 mg total) by mouth every 6 (six) hours as needed (Nausea or vomiting).  . quinapril (ACCUPRIL) 20 MG tablet quinapril 20 mg tablet  . sulfaSALAzine (AZULFIDINE) 500 MG EC tablet sulfasalazine 500 mg tablet,delayed release  . Flossie Buffy  MICRO PEN NEEDLES 32G X 4 MM MISC     PHQ 2/9 Scores 12/17/2017  PHQ - 2 Score 0    Physical Exam  Constitutional: She is oriented to person, place, and time. She appears well-developed. No distress.  HENT:  Head: Normocephalic and atraumatic.  Neck: Full passive range of motion without pain. Carotid bruit is not present.  Cardiovascular: Normal rate, regular rhythm and normal pulses.  Murmur heard.  Systolic murmur is present with a grade of 2/6. Pulmonary/Chest: Effort normal and breath sounds normal. No respiratory distress. She has no wheezes. She has no rhonchi.  Musculoskeletal: Normal  range of motion.  Neurological: She is alert and oriented to person, place, and time.  Skin: Skin is warm and dry. No rash noted.     Psychiatric: She has a normal mood and affect. Her speech is normal and behavior is normal. Thought content normal.  Nursing note and vitals reviewed.   BP 116/70   Pulse 81   Temp 98.6 F (37 C) (Oral)   Resp 16   Ht 5' 1.75" (1.568 m)   Wt 154 lb (69.9 kg)   SpO2 96%   BMI 28.40 kg/m   Assessment and Plan: 1. Type 2 diabetes mellitus without complication, with long-term current use of insulin (HCC) Continue insulin and SNU - Hemoglobin A1c; Future  2. Essential hypertension controlled  3. Chronic obstructive pulmonary disease, unspecified COPD type ([place]) Quit smoking 2011  4. 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 Mixed hyperlipidemia Continue statin therapy  6. Fever blister If worsening/recurrent, consider Valtrex  7. 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 Localized edema Recommend elevation of feet as able  9. Tobacco use disorder, moderate, in sustained remission  Meds ordered this encounter  Medications  . levothyroxine (SYNTHROID, LEVOTHROID) 50 MCG tablet    Sig: Take 1 tablet (50 mcg total) by mouth daily before breakfast.    Dispense:  90 tablet    Refill:  1  . NOVOLOG MIX 70/30 FLEXPEN (70-30) 100 UNIT/ML FlexPen    Sig: Inject 0.1 mLs (10 Units total) into the skin 2 (two) times daily with a meal.    Dispense:  21 mL    Refill:  1  . quinapril (ACCUPRIL) 20 MG tablet    Sig: Take 1 tablet (20 mg total) by mouth daily.    Dispense:  90 tablet    Refill:  1  . sulfaSALAzine (AZULFIDINE) 500 MG EC tablet    Sig: Take 1 tablet (500 mg total) by mouth at bedtime.    Dispense:  90 tablet    Refill:  1  . furosemide (LASIX) 20 MG tablet    Sig: Take 1 tablet (20 mg total) by mouth daily.    Dispense:  30 tablet    Refill:  3    . glimepiride (AMARYL) 4 MG tablet    Sig: Take 1 tablet (4 mg total) by mouth daily.    Dispense:  90 tablet    Refill:  1  . atorvastatin (LIPITOR) 40 MG tablet    Sig: Take 1 tablet (40 mg total) by mouth daily.    Dispense:  90 tablet    Refill:  1  . pantoprazole (PROTONIX) 40 MG tablet    Sig: Take 1 tablet (40 mg total) by mouth daily.    Dispense:  90 tablet    Refill:  1  . ONE  TOUCH ULTRA TEST test strip    Sig: 1 each by Other route 2 (two) times daily. Use as instructed    Dispense:  200 each    Refill:  1   Awaiting records requested from previous provider. Partially dictated using Editor, commissioning. Any errors are unintentional.  Halina Maidens, MD [facility]  12/17/2017

## 2017-12-17 NOTE — Patient Instructions (Signed)
Need to schedule a Diabetic Eye exam -

## 2017-12-18 ENCOUNTER — Inpatient Hospital Stay: Payer: Medicare Other

## 2017-12-18 ENCOUNTER — Encounter: Payer: Self-pay | Admitting: Oncology

## 2017-12-18 VITALS — BP 107/62 | HR 73 | Temp 98.3°F | Resp 20

## 2017-12-18 DIAGNOSIS — D46C Myelodysplastic syndrome with isolated del(5q) chromosomal abnormality: Secondary | ICD-10-CM

## 2017-12-18 DIAGNOSIS — D696 Thrombocytopenia, unspecified: Secondary | ICD-10-CM | POA: Diagnosis not present

## 2017-12-18 DIAGNOSIS — D500 Iron deficiency anemia secondary to blood loss (chronic): Secondary | ICD-10-CM | POA: Diagnosis not present

## 2017-12-18 DIAGNOSIS — R5383 Other fatigue: Secondary | ICD-10-CM | POA: Diagnosis not present

## 2017-12-18 DIAGNOSIS — Z5111 Encounter for antineoplastic chemotherapy: Secondary | ICD-10-CM | POA: Diagnosis not present

## 2017-12-18 DIAGNOSIS — Q2733 Arteriovenous malformation of digestive system vessel: Secondary | ICD-10-CM | POA: Diagnosis not present

## 2017-12-18 MED ORDER — AZACITIDINE CHEMO SQ INJECTION
75.0000 mg/m2 | Freq: Once | INTRAMUSCULAR | Status: AC
  Administered 2017-12-18: 132.5 mg via SUBCUTANEOUS
  Filled 2017-12-18: qty 5.3

## 2017-12-18 MED ORDER — ONDANSETRON HCL 4 MG PO TABS
8.0000 mg | ORAL_TABLET | Freq: Once | ORAL | Status: AC
  Administered 2017-12-18: 8 mg via ORAL
  Filled 2017-12-18: qty 2

## 2017-12-19 ENCOUNTER — Other Ambulatory Visit: Payer: Self-pay | Admitting: Internal Medicine

## 2017-12-19 ENCOUNTER — Inpatient Hospital Stay: Payer: Medicare Other

## 2017-12-19 ENCOUNTER — Telehealth: Payer: Self-pay

## 2017-12-19 VITALS — BP 122/60 | HR 65 | Temp 97.8°F | Resp 20 | Wt 154.0 lb

## 2017-12-19 DIAGNOSIS — Q2733 Arteriovenous malformation of digestive system vessel: Secondary | ICD-10-CM | POA: Diagnosis not present

## 2017-12-19 DIAGNOSIS — R5383 Other fatigue: Secondary | ICD-10-CM | POA: Diagnosis not present

## 2017-12-19 DIAGNOSIS — D46C Myelodysplastic syndrome with isolated del(5q) chromosomal abnormality: Secondary | ICD-10-CM

## 2017-12-19 DIAGNOSIS — D500 Iron deficiency anemia secondary to blood loss (chronic): Secondary | ICD-10-CM | POA: Diagnosis not present

## 2017-12-19 DIAGNOSIS — D696 Thrombocytopenia, unspecified: Secondary | ICD-10-CM | POA: Diagnosis not present

## 2017-12-19 DIAGNOSIS — Z5111 Encounter for antineoplastic chemotherapy: Secondary | ICD-10-CM | POA: Diagnosis not present

## 2017-12-19 MED ORDER — ONDANSETRON HCL 4 MG PO TABS
8.0000 mg | ORAL_TABLET | Freq: Once | ORAL | Status: AC
  Administered 2017-12-19: 8 mg via ORAL
  Filled 2017-12-19: qty 2

## 2017-12-19 MED ORDER — NOVOLOG MIX 70/30 FLEXPEN (70-30) 100 UNIT/ML ~~LOC~~ SUPN: 10.0000 [IU] | PEN_INJECTOR | Freq: Two times a day (BID) | SUBCUTANEOUS | 1 refills | Status: DC

## 2017-12-19 MED ORDER — AZACITIDINE CHEMO SQ INJECTION
75.0000 mg/m2 | Freq: Once | INTRAMUSCULAR | Status: AC
  Administered 2017-12-19: 132.5 mg via SUBCUTANEOUS
  Filled 2017-12-19: qty 5.3

## 2017-12-19 NOTE — Telephone Encounter (Signed)
So insurance will not pay for insulin Novolog due to dosage amount doesn't meet their formulary. Pharmacist from [facility] stated that it needs to state 10-15 units #21 R1 so that it will show insurance patient may require up to 15 units for extra. Patient recently had filled in [place] April 2019 and first available to use new Rx will be January 09, 2018

## 2017-12-20 ENCOUNTER — Inpatient Hospital Stay: Payer: Medicare Other

## 2017-12-20 VITALS — BP 120/62 | HR 68 | Temp 98.0°F | Resp 20

## 2017-12-20 DIAGNOSIS — Q2733 Arteriovenous malformation of digestive system vessel: Secondary | ICD-10-CM | POA: Diagnosis not present

## 2017-12-20 DIAGNOSIS — D46C Myelodysplastic syndrome with isolated del(5q) chromosomal abnormality: Secondary | ICD-10-CM

## 2017-12-20 DIAGNOSIS — D696 Thrombocytopenia, unspecified: Secondary | ICD-10-CM | POA: Diagnosis not present

## 2017-12-20 DIAGNOSIS — Z5111 Encounter for antineoplastic chemotherapy: Secondary | ICD-10-CM | POA: Diagnosis not present

## 2017-12-20 DIAGNOSIS — R5383 Other fatigue: Secondary | ICD-10-CM | POA: Diagnosis not present

## 2017-12-20 DIAGNOSIS — D500 Iron deficiency anemia secondary to blood loss (chronic): Secondary | ICD-10-CM | POA: Diagnosis not present

## 2017-12-20 MED ORDER — AZACITIDINE CHEMO SQ INJECTION
75.0000 mg/m2 | Freq: Once | INTRAMUSCULAR | Status: AC
  Administered 2017-12-20: 132.5 mg via SUBCUTANEOUS
  Filled 2017-12-20: qty 5.3

## 2017-12-20 MED ORDER — ONDANSETRON HCL 4 MG PO TABS
8.0000 mg | ORAL_TABLET | Freq: Once | ORAL | Status: AC
  Administered 2017-12-20: 8 mg via ORAL
  Filled 2017-12-20: qty 2

## 2017-12-21 ENCOUNTER — Inpatient Hospital Stay: Payer: Medicare Other

## 2017-12-21 VITALS — BP 135/65 | HR 68 | Temp 97.4°F | Resp 20

## 2017-12-21 DIAGNOSIS — R5383 Other fatigue: Secondary | ICD-10-CM | POA: Diagnosis not present

## 2017-12-21 DIAGNOSIS — D46C Myelodysplastic syndrome with isolated del(5q) chromosomal abnormality: Secondary | ICD-10-CM | POA: Diagnosis not present

## 2017-12-21 DIAGNOSIS — Z5111 Encounter for antineoplastic chemotherapy: Secondary | ICD-10-CM | POA: Diagnosis not present

## 2017-12-21 DIAGNOSIS — D696 Thrombocytopenia, unspecified: Secondary | ICD-10-CM | POA: Diagnosis not present

## 2017-12-21 DIAGNOSIS — D500 Iron deficiency anemia secondary to blood loss (chronic): Secondary | ICD-10-CM | POA: Diagnosis not present

## 2017-12-21 DIAGNOSIS — Q2733 Arteriovenous malformation of digestive system vessel: Secondary | ICD-10-CM | POA: Diagnosis not present

## 2017-12-21 MED ORDER — ONDANSETRON HCL 4 MG PO TABS
8.0000 mg | ORAL_TABLET | Freq: Once | ORAL | Status: AC
  Administered 2017-12-21: 8 mg via ORAL
  Filled 2017-12-21: qty 2

## 2017-12-21 MED ORDER — AZACITIDINE CHEMO SQ INJECTION
75.0000 mg/m2 | Freq: Once | INTRAMUSCULAR | Status: AC
  Administered 2017-12-21: 132.5 mg via SUBCUTANEOUS
  Filled 2017-12-21: qty 5.3

## 2017-12-24 ENCOUNTER — Inpatient Hospital Stay (HOSPITAL_BASED_OUTPATIENT_CLINIC_OR_DEPARTMENT_OTHER): Payer: Medicare Other | Admitting: Oncology

## 2017-12-24 ENCOUNTER — Inpatient Hospital Stay: Payer: Medicare Other

## 2017-12-24 ENCOUNTER — Encounter: Payer: Self-pay | Admitting: Oncology

## 2017-12-24 ENCOUNTER — Other Ambulatory Visit: Payer: Self-pay

## 2017-12-24 VITALS — BP 123/67 | HR 90 | Temp 97.6°F | Resp 18 | Wt 154.5 lb

## 2017-12-24 DIAGNOSIS — D46C Myelodysplastic syndrome with isolated del(5q) chromosomal abnormality: Secondary | ICD-10-CM

## 2017-12-24 DIAGNOSIS — K760 Fatty (change of) liver, not elsewhere classified: Secondary | ICD-10-CM | POA: Diagnosis not present

## 2017-12-24 DIAGNOSIS — Q2733 Arteriovenous malformation of digestive system vessel: Secondary | ICD-10-CM | POA: Diagnosis not present

## 2017-12-24 DIAGNOSIS — R5383 Other fatigue: Secondary | ICD-10-CM

## 2017-12-24 DIAGNOSIS — Z794 Long term (current) use of insulin: Secondary | ICD-10-CM | POA: Diagnosis not present

## 2017-12-24 DIAGNOSIS — E119 Type 2 diabetes mellitus without complications: Secondary | ICD-10-CM | POA: Diagnosis not present

## 2017-12-24 DIAGNOSIS — Z79899 Other long term (current) drug therapy: Secondary | ICD-10-CM

## 2017-12-24 DIAGNOSIS — D696 Thrombocytopenia, unspecified: Secondary | ICD-10-CM

## 2017-12-24 DIAGNOSIS — Z5111 Encounter for antineoplastic chemotherapy: Secondary | ICD-10-CM | POA: Diagnosis not present

## 2017-12-24 DIAGNOSIS — E034 Atrophy of thyroid (acquired): Secondary | ICD-10-CM

## 2017-12-24 DIAGNOSIS — R5381 Other malaise: Secondary | ICD-10-CM

## 2017-12-24 DIAGNOSIS — D500 Iron deficiency anemia secondary to blood loss (chronic): Secondary | ICD-10-CM | POA: Diagnosis not present

## 2017-12-24 DIAGNOSIS — Z87891 Personal history of nicotine dependence: Secondary | ICD-10-CM | POA: Diagnosis not present

## 2017-12-24 LAB — CBC WITH DIFFERENTIAL/PLATELET
Basophils Absolute: 0 10*3/uL (ref 0–0.1)
Basophils Relative: 1 %
EOS ABS: 0.1 10*3/uL (ref 0–0.7)
Eosinophils Relative: 3 %
HCT: 32.7 % — ABNORMAL LOW (ref 35.0–47.0)
HEMOGLOBIN: 10.7 g/dL — AB (ref 12.0–16.0)
LYMPHS ABS: 0.8 10*3/uL — AB (ref 1.0–3.6)
Lymphocytes Relative: 21 %
MCH: 28.9 pg (ref 26.0–34.0)
MCHC: 32.8 g/dL (ref 32.0–36.0)
MCV: 88.1 fL (ref 80.0–100.0)
MONO ABS: 0.3 10*3/uL (ref 0.2–0.9)
Monocytes Relative: 8 %
NEUTROS PCT: 67 %
Neutro Abs: 2.4 10*3/uL (ref 1.4–6.5)
PLATELETS: 31 10*3/uL — AB (ref 150–400)
RBC: 3.71 MIL/uL — AB (ref 3.80–5.20)
RDW: 22.9 % — ABNORMAL HIGH (ref 11.5–14.5)
WBC: 3.6 10*3/uL — AB (ref 4.0–10.5)

## 2017-12-24 LAB — HEMOGLOBIN A1C
HEMOGLOBIN A1C: 5.5 % (ref 4.8–5.6)
Mean Plasma Glucose: 111.15 mg/dL

## 2017-12-24 LAB — COMPREHENSIVE METABOLIC PANEL
ALT: 14 U/L (ref 14–54)
AST: 21 U/L (ref 15–41)
Albumin: 3.4 g/dL — ABNORMAL LOW (ref 3.5–5.0)
Alkaline Phosphatase: 124 U/L (ref 38–126)
Anion gap: 8 (ref 5–15)
BUN: 20 mg/dL (ref 6–20)
CHLORIDE: 105 mmol/L (ref 101–111)
CO2: 25 mmol/L (ref 22–32)
CREATININE: 1.11 mg/dL — AB (ref 0.44–1.00)
Calcium: 8.9 mg/dL (ref 8.9–10.3)
GFR calc Af Amer: 54 mL/min — ABNORMAL LOW (ref >60)
GFR calc non Af Amer: 46 mL/min — ABNORMAL LOW (ref >60)
Glucose, Bld: 244 mg/dL — ABNORMAL HIGH (ref 65–99)
Potassium: 4.5 mmol/L (ref 3.5–5.1)
Sodium: 138 mmol/L (ref 135–145)
Total Bilirubin: 0.7 mg/dL (ref 0.3–1.2)
Total Protein: 6.5 g/dL (ref 6.5–8.1)

## 2017-12-24 LAB — SAMPLE TO BLOOD BANK

## 2017-12-24 LAB — TSH: TSH: 1.806 u[IU]/mL (ref 0.350–4.500)

## 2017-12-24 NOTE — Progress Notes (Signed)
Hematology/Oncology Follow up note [facility] Telephone:([phone] Fax:(336) [phone]   Patient Care Team: Glean Hess, MD as PCP - General (Internal Medicine)  REFERRING PROVIDER: Glean Hess, MD  Previous Oncologist Dr.Srujitha Murkutla REASON FOR VISIT Follow up for treatment of MDS  HISTORY OF PRESENTING ILLNESS:  Yesenia Hart is a  77 y.o.  female with PMH listed below who was referred to me for evaluation of thrombocytopenia.   Recently moved from [place] to [place].  She is living at [place] with husband.  Accompanied by daughter, Yesenia Hart who is a respiratory therapist at [employer].  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  Patient presented to emergency room in January 2019 with complaints of intermittent rectal bleeding and increased to have a hemoglobin of 3 and platelet counts was 59,000.  Received 5 units of PRBC and was discharged home with hemoglobin of 10.  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  Cauterization was deferred due to thrombocytopenia. Aspirin was discontinued due to GI bleeding. Peripheral blood flow, SPEP negative, no obvious signs of B12 deficiency.  Ultrasound of the abdomen showed mild fatty liver.  On October 04, 2017, patient underwent bone marrow biopsy. Bone marrow core biopsy, bone marrow aspirate clot, bone marrow aspirate smears showed  single linage dysplasia.  Less than 5% bone marrow blasts and no circulating blast. 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  20 metaphases, lites were  normal and 18 metaphases 5 q. deletion, it has translocation involving chromosome of 3, 17, 16, and iso-di centric 22.  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  Patient received IV iron infusion on moving to [place].  She has not had any treatment for MDS done due to the move.  Today patient is accompanied by her daughter to the clinic.  She reports feeling tired, no weight loss.  She reports easy bruising.  Intermittent epistaxis which spontaneously resolved after applying pressure denies any hematochezia, hematemesis, hemoptysis.  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 Although patient does have 5q deletion which if isolated, is a good cytogentic group, she carried other cytogentic abnormalities which increases her IPSS-R score.   INTERVAL HISTORY Yesenia Hart is a 77 y.o. female who has above history reviewed by me today presents for follow-up assessment of the tolerability of cycle 1.  Azacitidine treatment.  Problems and complaints are listed below: Thrombocytopenia: Stable, no bleeding events. Fatigue: Chronic, stable, not worse or better. Easy bruising: Stable no bleeding events. Appetite fair  Review of Systems  Constitutional: Positive for malaise/fatigue. Negative for chills, fever and weight loss.  HENT: Negative for congestion, ear discharge, ear pain, nosebleeds, sinus pain and sore throat.   Eyes: Negative for double vision, photophobia, pain, discharge and redness.  Respiratory: Negative for cough, hemoptysis, sputum production, shortness of breath and wheezing.   Cardiovascular: Negative for chest pain, palpitations, orthopnea, claudication and leg swelling.  Gastrointestinal: Negative for abdominal pain, blood in stool, constipation, diarrhea, heartburn, melena, nausea and vomiting.  Genitourinary: Negative for dysuria, flank pain, frequency and hematuria.    Musculoskeletal: Negative for back  pain, myalgias and neck pain.  Skin: Negative for itching and rash.  Neurological: Negative for dizziness, tingling, tremors, focal weakness, weakness and headaches.  Endo/Heme/Allergies: Negative for environmental allergies. Bruises/bleeds easily.  Psychiatric/Behavioral: Negative for depression and hallucinations. The patient is not nervous/anxious.     MEDICAL HISTORY:  Past Medical History:  Diagnosis Date  . Anemia   . Clotting disorder ([place])   . Diabetes mellitus without complication ([place])   . IBS (irritable bowel syndrome)   . Iron deficiency anemia due to chronic blood loss 12/12/2017  . MDS (myelodysplastic syndrome) ([place])   . Thyroid disease     SURGICAL HISTORY: Past Surgical History:  Procedure Laterality Date  . APPENDECTOMY    . breast biopsy 68'    . CHOLECYSTECTOMY    . CORONARY ANGIOPLASTY WITH STENT PLACEMENT  2016   in [place]: Social History   Socioeconomic History  . Marital status: Married    Spouse name: Not on file  . Number of children: Not on file  . Years of education: Not on file  . Highest education level: Not on file  Occupational History  . Occupation: retired  Scientific laboratory technician  . Financial resource strain: Not on file  . Food insecurity:    Worry: Not on file    Inability: Not on file  . Transportation needs:    Medical: Not on file    Non-medical: Not on file  Tobacco Use  . Smoking status: Former Smoker    Packs/day: 1.00    Types: Cigarettes    Last attempt to quit: 1999    Years since quitting: 20.4  . Smokeless tobacco: Never Used  Substance and Sexual Activity  . Alcohol use: Not Currently  . Drug use: Never  . Sexual activity: Yes  Lifestyle  . Physical activity:    Days per week: Not on file    Minutes per session: Not on file  . Stress: Not on file  Relationships  . Social connections:    Talks on phone: Not on file    Gets together: Not on file    Attends  religious service: Not on file    Active member of club or organization: Not on file    Attends meetings of clubs or organizations: Not on file    Relationship status: Not on file  . Intimate partner violence:    Fear of current or ex partner: Not on file    Emotionally abused: Not on file    Physically abused: Not on file    Forced sexual activity: Not on file  Other Topics Concern  . Not on file  Social History Narrative  . Not on file    FAMILY HISTORY: Family History  Adopted: Yes  Family history unknown: Yes    ALLERGIES:  is allergic to acarbose; demerol [meperidine hcl]; meperidine; and pioglitazone.  MEDICATIONS:  Current Outpatient Medications  Medication Sig Dispense Refill  . atorvastatin (LIPITOR) 40 MG tablet Take 1 tablet (40 mg total) by mouth daily. 90 tablet 1  . furosemide (LASIX) 20 MG tablet Take 1 tablet (20 mg total) by mouth daily. 30 tablet 3  . glimepiride (AMARYL) 4 MG tablet Take 1 tablet (4 mg total) by mouth daily. 90 tablet 1  . levothyroxine (SYNTHROID, LEVOTHROID) 50 MCG tablet Take 1 tablet (50 mcg total) by mouth daily before breakfast. 90 tablet 1  . NOVOLOG MIX 70/30 FLEXPEN (70-30) 100 UNIT/ML FlexPen Inject 0.1-0.15 mLs (10-15 Units  total) into the skin 2 (two) times daily with a meal. 21 mL 1  . nystatin (MYCOSTATIN/NYSTOP) powder nystatin 100,000 unit/gram topical powder   1 app by topical route.    . ondansetron (ZOFRAN) 8 MG tablet Take 1 tablet (8 mg total) by mouth 2 (two) times daily as needed (Nausea or vomiting). 30 tablet 1  . ONE TOUCH ULTRA TEST test strip 1 each by Other route 2 (two) times daily. Use as instructed 200 each 1  . pantoprazole (PROTONIX) 40 MG tablet Take 1 tablet (40 mg total) by mouth daily. 90 tablet 1  . prochlorperazine (COMPAZINE) 10 MG tablet Take 1 tablet (10 mg total) by mouth every 6 (six) hours as needed (Nausea or vomiting). 30 tablet 1  . quinapril (ACCUPRIL) 20 MG tablet Take 1 tablet (20 mg total) by  mouth daily. 90 tablet 1  . sulfaSALAzine (AZULFIDINE) 500 MG EC tablet Take 1 tablet (500 mg total) by mouth at bedtime. 90 tablet 1  . ULTICARE MICRO PEN NEEDLES 32G X 4 MM MISC     . azaCITIDine in lactated ringers infusion Inject 100 mg/m2 into the vein daily.     No current facility-administered medications for this visit.      PHYSICAL EXAMINATION: ECOG PERFORMANCE STATUS: 1 - Symptomatic but completely ambulatory Vitals:   12/24/17 1005  BP: 123/67  Pulse: 90  Resp: 18  Temp: 97.6 F (36.4 C)   Filed Weights   12/24/17 1005  Weight: 154 lb 8 oz (70.1 kg)    Physical Exam  Constitutional: She is oriented to person, place, and time. She appears well-developed and well-nourished. No distress.  HENT:  Head: Normocephalic and atraumatic.  Right Ear: External ear normal.  Left Ear: External ear normal.  Mouth/Throat: Oropharynx is clear and moist.  Eyes: Pupils are equal, round, and reactive to light. Conjunctivae and EOM are normal. No scleral icterus.  Pale conjunctivae.   Neck: Normal range of motion. Neck supple.  Cardiovascular: Normal rate, regular rhythm and normal heart sounds.  Pulmonary/Chest: Effort normal and breath sounds normal. No respiratory distress. She has no wheezes. She has no rales. She exhibits no tenderness.  Abdominal: Soft. Bowel sounds are normal. She exhibits no distension and no mass. There is no tenderness.  Musculoskeletal: Normal range of motion. She exhibits no edema or deformity.  Lymphadenopathy:    She has no cervical adenopathy.  Neurological: She is alert and oriented to person, place, and time. No cranial nerve deficit. Coordination normal.  Skin: Skin is warm and dry. No rash noted.  Bruises.   Psychiatric: She has a normal mood and affect. Her behavior is normal. Thought content normal.     LABORATORY DATA:  I have reviewed the data as listed Lab Results  Component Value Date   WBC 3.6 (L) 12/24/2017   HGB 10.7 (L)  12/24/2017   HCT 32.7 (L) 12/24/2017   MCV 88.1 12/24/2017   PLT 31 (L) 12/24/2017   Recent Labs    11/28/17 1512 12/17/17 0843 12/24/17 0944  NA 138 138 138  K 4.0 4.0 4.5  CL 106 107 105  CO2 _0 GLUCOSE 101* 206* 244*  BUN _1 CREATININE 1.04* 1.14* 1.11*  CALCIUM 8.7* 8.7* 8.9  GFRNONAA 50* 45* 46*  GFRAA 58* 52* 54*  PROT 6.6 6.3* 6.5  ALBUMIN 3.5 3.4* 3.4*  AST _2 ALT _3 ALKPHOS 110 123 124  BILITOT 0.8  0.7 0.7       ASSESSMENT & PLAN:  1. 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 Thrombocytopenia ([place])   3. Other fatigue   # MDS:  S/p cycle 1 Vidaza, appears tolerating chemotherapy well.  Labs reviewed, counts are stable. Repeat CBC in 10 days. Repeat CBC, CMP in 3 weeks for assessment prior to cycle 2 with   # Thrombocytopenia: Not reviewed, platelet count stable at 31,000.  No acute bleeding.  # Fatigue: chronic, stable. Monitor.   Orders Placed This Encounter  Procedures  . CBC with Differential/Platelet    Standing Status:   Future    Standing Expiration Date:   12/25/2018    Patient to follow up in 3 weeks and assess prior to next cycle chemothrapy.   All questions were answered. The patient knows to call the clinic with any problems questions or concerns.   Return of visit:3 weeks.   Earlie Server, MD, PhD Hematology Oncology [facility] Pager- [phone] 12/24/2017

## 2017-12-24 NOTE — Progress Notes (Signed)
Patient here today for follow up. No concerns voiced.

## 2018-01-03 ENCOUNTER — Inpatient Hospital Stay: Payer: Medicare Other

## 2018-01-03 ENCOUNTER — Telehealth: Payer: Self-pay | Admitting: *Deleted

## 2018-01-03 DIAGNOSIS — D500 Iron deficiency anemia secondary to blood loss (chronic): Secondary | ICD-10-CM | POA: Diagnosis not present

## 2018-01-03 DIAGNOSIS — Q2733 Arteriovenous malformation of digestive system vessel: Secondary | ICD-10-CM | POA: Diagnosis not present

## 2018-01-03 DIAGNOSIS — D46C Myelodysplastic syndrome with isolated del(5q) chromosomal abnormality: Secondary | ICD-10-CM

## 2018-01-03 DIAGNOSIS — Z5111 Encounter for antineoplastic chemotherapy: Secondary | ICD-10-CM | POA: Diagnosis not present

## 2018-01-03 DIAGNOSIS — D696 Thrombocytopenia, unspecified: Secondary | ICD-10-CM | POA: Diagnosis not present

## 2018-01-03 DIAGNOSIS — R5383 Other fatigue: Secondary | ICD-10-CM | POA: Diagnosis not present

## 2018-01-03 LAB — CBC WITH DIFFERENTIAL/PLATELET
BASOS ABS: 0 10*3/uL (ref 0–0.1)
Basophils Relative: 1 %
Eosinophils Absolute: 0.1 10*3/uL (ref 0–0.7)
Eosinophils Relative: 3 %
HEMATOCRIT: 30.4 % — AB (ref 35.0–47.0)
HEMOGLOBIN: 9.9 g/dL — AB (ref 12.0–16.0)
LYMPHS ABS: 1 10*3/uL (ref 1.0–3.6)
LYMPHS PCT: 33 %
MCH: 29 pg (ref 26.0–34.0)
MCHC: 32.7 g/dL (ref 32.0–36.0)
MCV: 88.5 fL (ref 80.0–100.0)
Monocytes Absolute: 0.2 10*3/uL (ref 0.2–0.9)
Monocytes Relative: 7 %
NEUTROS ABS: 1.7 10*3/uL (ref 1.4–6.5)
NEUTROS PCT: 56 %
Platelets: 23 10*3/uL — CL (ref 150–400)
RBC: 3.43 MIL/uL — AB (ref 3.80–5.20)
RDW: 23.1 % — ABNORMAL HIGH (ref 11.5–14.5)
WBC: 3 10*3/uL — AB (ref 3.6–11.0)

## 2018-01-03 NOTE — Telephone Encounter (Addendum)
Received notice from lab that patient platelet count is 23K. This is not that much different from her previous counts.  Component     Latest Ref Rng & Units 11/28/2017 12/04/2017 12/12/2017  Platelets     150 - 400 K/uL 24 (LL) 25 (LL) 34 (L)

## 2018-01-14 ENCOUNTER — Inpatient Hospital Stay: Payer: Medicare Other

## 2018-01-14 ENCOUNTER — Inpatient Hospital Stay (HOSPITAL_BASED_OUTPATIENT_CLINIC_OR_DEPARTMENT_OTHER): Payer: Medicare Other | Admitting: Oncology

## 2018-01-14 ENCOUNTER — Inpatient Hospital Stay: Payer: Medicare Other | Attending: Oncology

## 2018-01-14 ENCOUNTER — Encounter: Payer: Self-pay | Admitting: Oncology

## 2018-01-14 VITALS — BP 107/67 | HR 70 | Temp 96.1°F | Resp 18 | Wt 155.2 lb

## 2018-01-14 DIAGNOSIS — D696 Thrombocytopenia, unspecified: Secondary | ICD-10-CM

## 2018-01-14 DIAGNOSIS — D46C Myelodysplastic syndrome with isolated del(5q) chromosomal abnormality: Secondary | ICD-10-CM | POA: Insufficient documentation

## 2018-01-14 DIAGNOSIS — R5382 Chronic fatigue, unspecified: Secondary | ICD-10-CM

## 2018-01-14 DIAGNOSIS — Z5111 Encounter for antineoplastic chemotherapy: Secondary | ICD-10-CM | POA: Diagnosis not present

## 2018-01-14 DIAGNOSIS — D701 Agranulocytosis secondary to cancer chemotherapy: Secondary | ICD-10-CM | POA: Diagnosis not present

## 2018-01-14 DIAGNOSIS — D702 Other drug-induced agranulocytosis: Secondary | ICD-10-CM | POA: Insufficient documentation

## 2018-01-14 LAB — CBC WITH DIFFERENTIAL/PLATELET
BASOS PCT: 1 %
Basophils Absolute: 0 10*3/uL (ref 0–0.1)
EOS PCT: 5 %
Eosinophils Absolute: 0.1 10*3/uL (ref 0–0.7)
HEMATOCRIT: 30 % — AB (ref 35.0–47.0)
HEMOGLOBIN: 9.7 g/dL — AB (ref 12.0–16.0)
LYMPHS PCT: 44 %
Lymphs Abs: 0.7 10*3/uL — ABNORMAL LOW (ref 1.0–3.6)
MCH: 28.8 pg (ref 26.0–34.0)
MCHC: 32.5 g/dL (ref 32.0–36.0)
MCV: 88.7 fL (ref 80.0–100.0)
MONO ABS: 0.1 10*3/uL — AB (ref 0.2–0.9)
Monocytes Relative: 5 %
NEUTROS PCT: 45 %
Neutro Abs: 0.6 10*3/uL — ABNORMAL LOW (ref 1.4–6.5)
Platelets: 44 10*3/uL — ABNORMAL LOW (ref 150–400)
RBC: 3.38 MIL/uL — ABNORMAL LOW (ref 3.80–5.20)
RDW: 24.6 % — ABNORMAL HIGH (ref 11.5–14.5)
WBC: 1.5 10*3/uL — ABNORMAL LOW (ref 4.0–10.5)

## 2018-01-14 LAB — COMPREHENSIVE METABOLIC PANEL
ALK PHOS: 110 U/L (ref 38–126)
ALT: 17 U/L (ref 0–44)
ANION GAP: 8 (ref 5–15)
AST: 24 U/L (ref 15–41)
Albumin: 3.4 g/dL — ABNORMAL LOW (ref 3.5–5.0)
BILIRUBIN TOTAL: 0.7 mg/dL (ref 0.3–1.2)
BUN: 18 mg/dL (ref 8–23)
CALCIUM: 8.7 mg/dL — AB (ref 8.9–10.3)
CO2: 22 mmol/L (ref 22–32)
Chloride: 109 mmol/L (ref 98–111)
Creatinine, Ser: 1.04 mg/dL — ABNORMAL HIGH (ref 0.44–1.00)
GFR, EST AFRICAN AMERICAN: 58 mL/min — AB (ref >60)
GFR, EST NON AFRICAN AMERICAN: 50 mL/min — AB (ref >60)
Glucose, Bld: 143 mg/dL — ABNORMAL HIGH (ref 70–99)
Potassium: 3.9 mmol/L (ref 3.5–5.1)
Sodium: 139 mmol/L (ref 135–145)
TOTAL PROTEIN: 6.4 g/dL — AB (ref 6.5–8.1)

## 2018-01-14 LAB — SAMPLE TO BLOOD BANK

## 2018-01-14 MED ORDER — ONDANSETRON HCL 4 MG PO TABS
8.0000 mg | ORAL_TABLET | Freq: Once | ORAL | Status: AC
  Administered 2018-01-14: 8 mg via ORAL
  Filled 2018-01-14: qty 2

## 2018-01-14 MED ORDER — AZACITIDINE CHEMO SQ INJECTION
75.0000 mg/m2 | Freq: Once | INTRAMUSCULAR | Status: AC
  Administered 2018-01-14: 132.5 mg via SUBCUTANEOUS
  Filled 2018-01-14: qty 5.3

## 2018-01-14 MED ORDER — CIPROFLOXACIN HCL 250 MG PO TABS: 250.0000 mg | ORAL_TABLET | Freq: Every day | ORAL | 0 refills | Status: DC

## 2018-01-14 NOTE — Progress Notes (Signed)
Pt in for follow up, denies any bleeding.  Had "a few loose stools but not diarrhea".  Resolved now.

## 2018-01-14 NOTE — Progress Notes (Signed)
ANC 0.6 today. Per Dr Tasia Catchings proceed with treatment

## 2018-01-14 NOTE — Progress Notes (Signed)
Hematology/Oncology Follow up note [facility] Telephone:(336) [phone] Fax:(336) [phone]   Patient Care Team: Glean Hess, MD as PCP - General (Internal Medicine)  REFERRING PROVIDER: Glean Hess, MD  Previous Oncologist Dr.Srujitha Murkutla REASON FOR VISIT Follow up for treatment of MDS  HISTORY OF PRESENTING ILLNESS:  Yesenia Hart is a  77 y.o.  female who recently moved from [place] to [place].  She used to live at [place] with husband.  Accompanied by daughter, [name] who is a respiratory therapist at [employer].  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  Patient presented to emergency room in January 2019 with complaints of intermittent rectal bleeding and increased to have a hemoglobin of 3 and platelet counts was 59,000.  Received 5 units of PRBC and was discharged home with hemoglobin of 10.  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  Cauterization was deferred due to thrombocytopenia. Aspirin was discontinued due to GI bleeding. Peripheral blood flow, SPEP negative, no obvious signs of B12 deficiency.  Ultrasound of the abdomen showed mild fatty liver.  On October 04, 2017, patient underwent bone marrow biopsy. Bone marrow core biopsy, bone marrow aspirate clot, bone marrow aspirate smears showed  single linage dysplasia.  Less than 5% bone marrow blasts and no circulating blast. Iron storage is nearly absent Peripheral blood smear showed moderate and anisopoikilocytosis Marrow aspirate normal in  adenoid maturation Cytogenetics performed at the [facility] in [place] showed 46,xx, t(3;17;16)(p21;q21;q24), add (5) (q11,2), idic(22)(p11,2)[18]/46,xx[2].  20 metaphases, lites were normal and 18 metaphases 5 q. deletion, it has translocation involving  chromosome of 3, 17, 16, and iso-di centric 22.  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  Patient received IV iron infusion on moving to [place].  She has not had any treatment for MDS done due to the move.  Today patient is accompanied by her daughter to the clinic.  She reports feeling tired, no weight loss.  She reports easy bruising.  Intermittent epistaxis which spontaneously resolved after applying pressure denies any hematochezia, hematemesis, hemoptysis.  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 Although patient does have 5q deletion which if isolated, is a good cytogentic group, she carried other cytogentic abnormalities which increases her IPSS-R score.   INTERVAL HISTORY Yesenia Hart is a 77 y.o. female who has above history reviewed by me present for follow-up assessment for MDS treatment with azacitidine.  Today she is going to start cycle 2.  Problems and complaints are listed below: Thrombocytopenia: Stable, no acute bleeding events. Fatigue: Chronic, stable.  Patient subjectively reports slightly better. Easy bruising: Continue to have easy bruising.  No bleeding Appetite fair  Review of Systems  Constitutional: Positive for malaise/fatigue. Negative for chills, fever and weight loss.  HENT: Negative for congestion, ear discharge, ear pain, nosebleeds, sinus pain and sore throat.   Eyes: Negative for double vision, photophobia, pain, discharge and redness.  Respiratory: Negative for cough, hemoptysis, sputum production, shortness of breath and wheezing.   Cardiovascular: Negative for chest pain, palpitations, orthopnea, claudication and leg swelling.  Gastrointestinal: Negative for abdominal pain, blood in stool, constipation, diarrhea, heartburn, melena, nausea and vomiting.  Genitourinary: Negative for dysuria, flank pain, frequency and hematuria.  Musculoskeletal:  Negative for back pain, myalgias and  neck pain.  Skin: Negative for itching and rash.  Neurological: Negative for dizziness, tingling, tremors, focal weakness, weakness and headaches.  Endo/Heme/Allergies: Negative for environmental allergies. Bruises/bleeds easily.  Psychiatric/Behavioral: Negative for depression and hallucinations. The patient is not nervous/anxious.     MEDICAL HISTORY:  Past Medical History:  Diagnosis Date  . Anemia   . Clotting disorder ([place])   . Diabetes mellitus without complication ([place])   . IBS (irritable bowel syndrome)   . Iron deficiency anemia due to chronic blood loss 12/12/2017  . MDS (myelodysplastic syndrome) ([place])   . Thyroid disease     SURGICAL HISTORY: Past Surgical History:  Procedure Laterality Date  . APPENDECTOMY    . breast biopsy 28'    . CHOLECYSTECTOMY    . CORONARY ANGIOPLASTY WITH STENT PLACEMENT  2016   in [place]: Social History   Socioeconomic History  . Marital status: Married    Spouse name: Not on file  . Number of children: Not on file  . Years of education: Not on file  . Highest education level: Not on file  Occupational History  . Occupation: retired  Scientific laboratory technician  . Financial resource strain: Not on file  . Food insecurity:    Worry: Not on file    Inability: Not on file  . Transportation needs:    Medical: Not on file    Non-medical: Not on file  Tobacco Use  . Smoking status: Former Smoker    Packs/day: 1.00    Types: Cigarettes    Last attempt to quit: 1999    Years since quitting: 20.5  . Smokeless tobacco: Never Used  Substance and Sexual Activity  . Alcohol use: Not Currently  . Drug use: Never  . Sexual activity: Yes  Lifestyle  . Physical activity:    Days per week: Not on file    Minutes per session: Not on file  . Stress: Not on file  Relationships  . Social connections:    Talks on phone: Not on file    Gets together: Not on file    Attends religious service: Not  on file    Active member of club or organization: Not on file    Attends meetings of clubs or organizations: Not on file    Relationship status: Not on file  . Intimate partner violence:    Fear of current or ex partner: Not on file    Emotionally abused: Not on file    Physically abused: Not on file    Forced sexual activity: Not on file  Other Topics Concern  . Not on file  Social History Narrative  . Not on file    FAMILY HISTORY: Family History  Adopted: Yes  Family history unknown: Yes    ALLERGIES:  is allergic to acarbose; demerol [meperidine hcl]; meperidine; and pioglitazone.  MEDICATIONS:  Current Outpatient Medications  Medication Sig Dispense Refill  . atorvastatin (LIPITOR) 40 MG tablet Take 1 tablet (40 mg total) by mouth daily. 90 tablet 1  . azaCITIDine in lactated ringers infusion Inject 100 mg/m2 into the vein daily.    . furosemide (LASIX) 20 MG tablet Take 1 tablet (20 mg total) by mouth daily. 30 tablet 3  . glimepiride (AMARYL) 4 MG tablet Take 1 tablet (4 mg total) by mouth daily. 90 tablet 1  . levothyroxine (SYNTHROID, LEVOTHROID) 50 MCG tablet Take 1 tablet (50 mcg total) by mouth daily before breakfast. 90 tablet 1  .  NOVOLOG MIX 70/30 FLEXPEN (70-30) 100 UNIT/ML FlexPen Inject 0.1-0.15 mLs (10-15 Units total) into the skin 2 (two) times daily with a meal. 21 mL 1  . nystatin (MYCOSTATIN/NYSTOP) powder nystatin 100,000 unit/gram topical powder   1 app by topical route.    . ONE TOUCH ULTRA TEST test strip 1 each by Other route 2 (two) times daily. Use as instructed 200 each 1  . pantoprazole (PROTONIX) 40 MG tablet Take 1 tablet (40 mg total) by mouth daily. 90 tablet 1  . quinapril (ACCUPRIL) 20 MG tablet Take 1 tablet (20 mg total) by mouth daily. 90 tablet 1  . sulfaSALAzine (AZULFIDINE) 500 MG EC tablet Take 1 tablet (500 mg total) by mouth at bedtime. 90 tablet 1  . ciprofloxacin (CIPRO) 250 MG tablet Take 1 tablet (250 mg total) by mouth daily.  10 tablet 0  . ondansetron (ZOFRAN) 8 MG tablet Take 1 tablet (8 mg total) by mouth 2 (two) times daily as needed (Nausea or vomiting). (Patient not taking: Reported on 01/14/2018) 30 tablet 1  . prochlorperazine (COMPAZINE) 10 MG tablet Take 1 tablet (10 mg total) by mouth every 6 (six) hours as needed (Nausea or vomiting). (Patient not taking: Reported on 01/14/2018) 30 tablet 1  . ULTICARE MICRO PEN NEEDLES 32G X 4 MM MISC      No current facility-administered medications for this visit.      PHYSICAL EXAMINATION: ECOG PERFORMANCE STATUS: 1 - Symptomatic but completely ambulatory Vitals:   01/14/18 1000  BP: 107/67  Pulse: 70  Resp: 18  Temp: (!) 96.1 F (35.6 C)   Filed Weights   01/14/18 1000  Weight: 155 lb 4 oz (70.4 kg)    Physical Exam  Constitutional: She is oriented to person, place, and time. She appears well-developed and well-nourished. No distress.  HENT:  Head: Normocephalic and atraumatic.  Right Ear: External ear normal.  Left Ear: External ear normal.  Mouth/Throat: Oropharynx is clear and moist.  Eyes: Pupils are equal, round, and reactive to light. Conjunctivae and EOM are normal. No scleral icterus.  Pale conjunctivae.   Neck: Normal range of motion. Neck supple.  Cardiovascular: Normal rate, regular rhythm and normal heart sounds.  Pulmonary/Chest: Effort normal and breath sounds normal. No respiratory distress. She has no wheezes. She has no rales. She exhibits no tenderness.  Abdominal: Soft. Bowel sounds are normal. She exhibits no distension and no mass. There is no tenderness.  Musculoskeletal: Normal range of motion. She exhibits no edema or deformity.  Lymphadenopathy:    She has no cervical adenopathy.  Neurological: She is alert and oriented to person, place, and time. No cranial nerve deficit. Coordination normal.  Skin: Skin is warm and dry. No rash noted.  Bruises.   Psychiatric: She has a normal mood and affect. Her behavior is normal.  Thought content normal.     LABORATORY DATA:  I have reviewed the data as listed Lab Results  Component Value Date   WBC 1.5 (L) 01/14/2018   HGB 9.7 (L) 01/14/2018   HCT 30.0 (L) 01/14/2018   MCV 88.7 01/14/2018   PLT 44 (L) 01/14/2018   Recent Labs    12/17/17 0843 12/24/17 0944 01/14/18 0927  NA 138 138 139  K 4.0 4.5 3.9  CL 107 105 109  CO2 _0 GLUCOSE 206* 244* 143*  BUN _1 CREATININE 1.14* 1.11* 1.04*  CALCIUM 8.7* 8.9 8.7*  GFRNONAA 45* 46* 50*  GFRAA 52* 54*  58*  PROT 6.3* 6.5 6.4*  ALBUMIN 3.4* 3.4* 3.4*  AST _0 ALT _1 ALKPHOS 123 124 110  BILITOT 0.7 0.7 0.7       ASSESSMENT & PLAN:  1. 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 Thrombocytopenia ([place])   3. Drug-induced neutropenia ([place])   # MDS:  S/p cycle 1 Vidaza, tolerates treatment well except neutropenia.,  Proceed with cycle 2 Vidaza, D1-4. Omit Day 5 treatment so that she can have Udenyca.  # Neutropenia: ANC 600, afebrile, plan Udenyca on Day 5  # Thrombocytopenia: improved to 44,000. Continue monitor # Fatigue: chronic, stable. Monitor.  Repeat CBC in 2 weeks.  Follow up with repeat labs cbc cmp and assessment prior to next cycle of Vidaza. Day 1-5 with Day 8 Udenyca.   All questions were answered. The patient knows to call the clinic with any problems questions or concerns.  Total face to face encounter time for this patient visit was 25 min. >50% of the time was  spent in counseling and coordination of care.  Return of visit: 4 weeks.   Earlie Server, MD, PhD Hematology Oncology [facility] Pager- [phone] 01/14/2018

## 2018-01-15 ENCOUNTER — Inpatient Hospital Stay: Payer: Medicare Other

## 2018-01-15 VITALS — BP 116/66 | HR 76 | Temp 96.2°F | Resp 20

## 2018-01-15 DIAGNOSIS — D46C Myelodysplastic syndrome with isolated del(5q) chromosomal abnormality: Secondary | ICD-10-CM

## 2018-01-15 DIAGNOSIS — Z5111 Encounter for antineoplastic chemotherapy: Secondary | ICD-10-CM | POA: Diagnosis not present

## 2018-01-15 DIAGNOSIS — D701 Agranulocytosis secondary to cancer chemotherapy: Secondary | ICD-10-CM | POA: Diagnosis not present

## 2018-01-15 DIAGNOSIS — D696 Thrombocytopenia, unspecified: Secondary | ICD-10-CM | POA: Diagnosis not present

## 2018-01-15 DIAGNOSIS — R5382 Chronic fatigue, unspecified: Secondary | ICD-10-CM | POA: Diagnosis not present

## 2018-01-15 MED ORDER — AZACITIDINE CHEMO SQ INJECTION
75.0000 mg/m2 | Freq: Once | INTRAMUSCULAR | Status: AC
  Administered 2018-01-15: 132.5 mg via SUBCUTANEOUS
  Filled 2018-01-15: qty 5.3

## 2018-01-15 MED ORDER — ONDANSETRON HCL 4 MG PO TABS
8.0000 mg | ORAL_TABLET | Freq: Once | ORAL | Status: AC
  Administered 2018-01-15: 8 mg via ORAL
  Filled 2018-01-15: qty 2

## 2018-01-16 ENCOUNTER — Inpatient Hospital Stay: Payer: Medicare Other

## 2018-01-16 VITALS — BP 104/55 | HR 75 | Temp 97.1°F | Resp 20 | Wt 155.0 lb

## 2018-01-16 DIAGNOSIS — D46C Myelodysplastic syndrome with isolated del(5q) chromosomal abnormality: Secondary | ICD-10-CM

## 2018-01-16 DIAGNOSIS — D701 Agranulocytosis secondary to cancer chemotherapy: Secondary | ICD-10-CM | POA: Diagnosis not present

## 2018-01-16 DIAGNOSIS — Z5111 Encounter for antineoplastic chemotherapy: Secondary | ICD-10-CM | POA: Diagnosis not present

## 2018-01-16 DIAGNOSIS — D696 Thrombocytopenia, unspecified: Secondary | ICD-10-CM | POA: Diagnosis not present

## 2018-01-16 DIAGNOSIS — R5382 Chronic fatigue, unspecified: Secondary | ICD-10-CM | POA: Diagnosis not present

## 2018-01-16 MED ORDER — AZACITIDINE CHEMO SQ INJECTION
75.0000 mg/m2 | Freq: Once | INTRAMUSCULAR | Status: AC
  Administered 2018-01-16: 132.5 mg via SUBCUTANEOUS
  Filled 2018-01-16: qty 5.3

## 2018-01-16 MED ORDER — ONDANSETRON HCL 4 MG PO TABS
8.0000 mg | ORAL_TABLET | Freq: Once | ORAL | Status: AC
Start: 2018-01-16 — End: 2018-01-16
  Administered 2018-01-16: 8 mg via ORAL
  Filled 2018-01-16: qty 2

## 2018-01-17 ENCOUNTER — Encounter: Payer: Self-pay | Admitting: Oncology

## 2018-01-17 ENCOUNTER — Inpatient Hospital Stay: Payer: Medicare Other

## 2018-01-17 VITALS — BP 114/64 | HR 80 | Temp 96.8°F | Resp 20

## 2018-01-17 DIAGNOSIS — D46C Myelodysplastic syndrome with isolated del(5q) chromosomal abnormality: Secondary | ICD-10-CM

## 2018-01-17 DIAGNOSIS — D696 Thrombocytopenia, unspecified: Secondary | ICD-10-CM | POA: Diagnosis not present

## 2018-01-17 DIAGNOSIS — D701 Agranulocytosis secondary to cancer chemotherapy: Secondary | ICD-10-CM | POA: Diagnosis not present

## 2018-01-17 DIAGNOSIS — Z5111 Encounter for antineoplastic chemotherapy: Secondary | ICD-10-CM | POA: Diagnosis not present

## 2018-01-17 DIAGNOSIS — R5382 Chronic fatigue, unspecified: Secondary | ICD-10-CM | POA: Diagnosis not present

## 2018-01-17 MED ORDER — ONDANSETRON HCL 4 MG PO TABS
8.0000 mg | ORAL_TABLET | Freq: Once | ORAL | Status: AC
  Administered 2018-01-17: 8 mg via ORAL
  Filled 2018-01-17: qty 2

## 2018-01-17 MED ORDER — AZACITIDINE CHEMO SQ INJECTION
75.0000 mg/m2 | Freq: Once | INTRAMUSCULAR | Status: AC
  Administered 2018-01-17: 132.5 mg via SUBCUTANEOUS
  Filled 2018-01-17: qty 5.3

## 2018-01-18 ENCOUNTER — Inpatient Hospital Stay: Payer: Medicare Other

## 2018-01-18 ENCOUNTER — Ambulatory Visit: Payer: Medicare Other

## 2018-01-18 DIAGNOSIS — D46C Myelodysplastic syndrome with isolated del(5q) chromosomal abnormality: Secondary | ICD-10-CM

## 2018-01-18 DIAGNOSIS — D696 Thrombocytopenia, unspecified: Secondary | ICD-10-CM | POA: Diagnosis not present

## 2018-01-18 DIAGNOSIS — R5382 Chronic fatigue, unspecified: Secondary | ICD-10-CM | POA: Diagnosis not present

## 2018-01-18 DIAGNOSIS — D701 Agranulocytosis secondary to cancer chemotherapy: Secondary | ICD-10-CM | POA: Diagnosis not present

## 2018-01-18 DIAGNOSIS — Z5111 Encounter for antineoplastic chemotherapy: Secondary | ICD-10-CM | POA: Diagnosis not present

## 2018-01-18 MED ORDER — PEGFILGRASTIM-CBQV 6 MG/0.6ML ~~LOC~~ SOSY
6.0000 mg | PREFILLED_SYRINGE | Freq: Once | SUBCUTANEOUS | Status: AC
  Administered 2018-01-18: 6 mg via SUBCUTANEOUS

## 2018-01-28 ENCOUNTER — Inpatient Hospital Stay: Payer: Medicare Other

## 2018-01-28 DIAGNOSIS — R5382 Chronic fatigue, unspecified: Secondary | ICD-10-CM | POA: Diagnosis not present

## 2018-01-28 DIAGNOSIS — D701 Agranulocytosis secondary to cancer chemotherapy: Secondary | ICD-10-CM | POA: Diagnosis not present

## 2018-01-28 DIAGNOSIS — Z5111 Encounter for antineoplastic chemotherapy: Secondary | ICD-10-CM | POA: Diagnosis not present

## 2018-01-28 DIAGNOSIS — D46C Myelodysplastic syndrome with isolated del(5q) chromosomal abnormality: Secondary | ICD-10-CM

## 2018-01-28 DIAGNOSIS — D696 Thrombocytopenia, unspecified: Secondary | ICD-10-CM | POA: Diagnosis not present

## 2018-01-28 LAB — COMPREHENSIVE METABOLIC PANEL
ALT: 16 U/L (ref 0–44)
AST: 24 U/L (ref 15–41)
Albumin: 3.5 g/dL (ref 3.5–5.0)
Alkaline Phosphatase: 121 U/L (ref 38–126)
Anion gap: 9 (ref 5–15)
BUN: 19 mg/dL (ref 8–23)
CHLORIDE: 108 mmol/L (ref 98–111)
CO2: 22 mmol/L (ref 22–32)
Calcium: 8.7 mg/dL — ABNORMAL LOW (ref 8.9–10.3)
Creatinine, Ser: 0.99 mg/dL (ref 0.44–1.00)
GFR calc Af Amer: >60 mL/min (ref >60)
GFR, EST NON AFRICAN AMERICAN: 53 mL/min — AB (ref >60)
Glucose, Bld: 154 mg/dL — ABNORMAL HIGH (ref 70–99)
POTASSIUM: 4 mmol/L (ref 3.5–5.1)
Sodium: 139 mmol/L (ref 135–145)
Total Bilirubin: 0.7 mg/dL (ref 0.3–1.2)
Total Protein: 6.2 g/dL — ABNORMAL LOW (ref 6.5–8.1)

## 2018-01-28 LAB — CBC WITH DIFFERENTIAL/PLATELET
BASOS PCT: 1 %
Basophils Absolute: 0 10*3/uL (ref 0–0.1)
Eosinophils Absolute: 0.1 10*3/uL (ref 0–0.7)
Eosinophils Relative: 3 %
HEMATOCRIT: 29.6 % — AB (ref 35.0–47.0)
Hemoglobin: 9.5 g/dL — ABNORMAL LOW (ref 12.0–16.0)
LYMPHS ABS: 0.7 10*3/uL — AB (ref 1.0–3.6)
Lymphocytes Relative: 17 %
MCH: 28.9 pg (ref 26.0–34.0)
MCHC: 32 g/dL (ref 32.0–36.0)
MCV: 90.2 fL (ref 80.0–100.0)
MONO ABS: 0.1 10*3/uL — AB (ref 0.2–0.9)
MONOS PCT: 3 %
NEUTROS ABS: 3 10*3/uL (ref 1.4–6.5)
Neutrophils Relative %: 76 %
Platelets: 23 10*3/uL — CL (ref 150–400)
RBC: 3.28 MIL/uL — ABNORMAL LOW (ref 3.80–5.20)
RDW: 24.7 % — AB (ref 11.5–14.5)
WBC: 3.9 10*3/uL (ref 3.6–11.0)

## 2018-01-28 LAB — SAMPLE TO BLOOD BANK

## 2018-02-07 ENCOUNTER — Other Ambulatory Visit: Payer: Self-pay

## 2018-02-07 MED ORDER — ONETOUCH ULTRASOFT LANCETS MISC: 12 refills | Status: DC

## 2018-02-11 ENCOUNTER — Encounter: Payer: Self-pay | Admitting: Oncology

## 2018-02-11 ENCOUNTER — Inpatient Hospital Stay: Payer: Medicare Other

## 2018-02-11 ENCOUNTER — Inpatient Hospital Stay: Payer: Medicare Other | Attending: Oncology | Admitting: Oncology

## 2018-02-11 ENCOUNTER — Other Ambulatory Visit: Payer: Self-pay

## 2018-02-11 VITALS — BP 110/64 | HR 55 | Temp 97.8°F | Resp 18 | Wt 156.4 lb

## 2018-02-11 DIAGNOSIS — D6189 Other specified aplastic anemias and other bone marrow failure syndromes: Secondary | ICD-10-CM | POA: Insufficient documentation

## 2018-02-11 DIAGNOSIS — Z87891 Personal history of nicotine dependence: Secondary | ICD-10-CM | POA: Insufficient documentation

## 2018-02-11 DIAGNOSIS — E119 Type 2 diabetes mellitus without complications: Secondary | ICD-10-CM | POA: Diagnosis not present

## 2018-02-11 DIAGNOSIS — D619 Aplastic anemia, unspecified: Secondary | ICD-10-CM | POA: Diagnosis not present

## 2018-02-11 DIAGNOSIS — D46C Myelodysplastic syndrome with isolated del(5q) chromosomal abnormality: Secondary | ICD-10-CM

## 2018-02-11 DIAGNOSIS — R5382 Chronic fatigue, unspecified: Secondary | ICD-10-CM | POA: Insufficient documentation

## 2018-02-11 DIAGNOSIS — D696 Thrombocytopenia, unspecified: Secondary | ICD-10-CM | POA: Diagnosis not present

## 2018-02-11 DIAGNOSIS — Z7689 Persons encountering health services in other specified circumstances: Secondary | ICD-10-CM | POA: Diagnosis not present

## 2018-02-11 DIAGNOSIS — R040 Epistaxis: Secondary | ICD-10-CM | POA: Diagnosis not present

## 2018-02-11 DIAGNOSIS — Z5111 Encounter for antineoplastic chemotherapy: Secondary | ICD-10-CM | POA: Diagnosis not present

## 2018-02-11 DIAGNOSIS — Z79899 Other long term (current) drug therapy: Secondary | ICD-10-CM | POA: Diagnosis not present

## 2018-02-11 DIAGNOSIS — R5381 Other malaise: Secondary | ICD-10-CM | POA: Diagnosis not present

## 2018-02-11 LAB — CBC WITH DIFFERENTIAL/PLATELET
BASOS ABS: 0.1 10*3/uL (ref 0–0.1)
BASOS PCT: 3 %
EOS PCT: 5 %
Eosinophils Absolute: 0.1 10*3/uL (ref 0–0.7)
HCT: 31.4 % — ABNORMAL LOW (ref 35.0–47.0)
Hemoglobin: 9.9 g/dL — ABNORMAL LOW (ref 12.0–16.0)
Lymphocytes Relative: 37 %
Lymphs Abs: 1.2 10*3/uL (ref 1.0–3.6)
MCH: 29.2 pg (ref 26.0–34.0)
MCHC: 31.5 g/dL — ABNORMAL LOW (ref 32.0–36.0)
MCV: 92.6 fL (ref 80.0–100.0)
MONO ABS: 0.3 10*3/uL (ref 0.2–0.9)
Monocytes Relative: 10 %
Neutro Abs: 1.4 10*3/uL (ref 1.4–6.5)
Neutrophils Relative %: 45 %
PLATELETS: 78 10*3/uL — AB (ref 150–440)
RBC: 3.39 MIL/uL — ABNORMAL LOW (ref 3.80–5.20)
RDW: 26 % — AB (ref 11.5–14.5)
WBC: 3.1 10*3/uL — ABNORMAL LOW (ref 3.6–11.0)

## 2018-02-11 LAB — SAMPLE TO BLOOD BANK

## 2018-02-11 LAB — COMPREHENSIVE METABOLIC PANEL
ALK PHOS: 108 U/L (ref 38–126)
ALT: 21 U/L (ref 0–44)
AST: 25 U/L (ref 15–41)
Albumin: 3.6 g/dL (ref 3.5–5.0)
Anion gap: 7 (ref 5–15)
BUN: 20 mg/dL (ref 8–23)
CALCIUM: 9 mg/dL (ref 8.9–10.3)
CO2: 23 mmol/L (ref 22–32)
CREATININE: 1.12 mg/dL — AB (ref 0.44–1.00)
Chloride: 108 mmol/L (ref 98–111)
GFR calc non Af Amer: 46 mL/min — ABNORMAL LOW (ref >60)
GFR, EST AFRICAN AMERICAN: 53 mL/min — AB (ref >60)
GLUCOSE: 106 mg/dL — AB (ref 70–99)
Potassium: 4.2 mmol/L (ref 3.5–5.1)
SODIUM: 138 mmol/L (ref 135–145)
TOTAL PROTEIN: 6.3 g/dL — AB (ref 6.5–8.1)
Total Bilirubin: 0.5 mg/dL (ref 0.3–1.2)

## 2018-02-11 MED ORDER — AZACITIDINE CHEMO SQ INJECTION
75.0000 mg/m2 | Freq: Once | INTRAMUSCULAR | Status: AC
  Administered 2018-02-11: 132.5 mg via SUBCUTANEOUS
  Filled 2018-02-11: qty 5.3

## 2018-02-11 MED ORDER — ONDANSETRON HCL 4 MG PO TABS: 8.0000 mg | ORAL_TABLET | Freq: Once | ORAL | Status: DC

## 2018-02-11 NOTE — Progress Notes (Signed)
Patient here for follow up. No concerns voiced.  °

## 2018-02-11 NOTE — Progress Notes (Signed)
Hematology/Oncology Follow up note [facility] Telephone:([phone] Fax:([phone]   Patient Care Team: Glean Hess, MD as PCP - General (Internal Medicine)  REFERRING PROVIDER: Glean Hess, MD  Previous Oncologist Dr.Srujitha Murkutla REASON FOR VISIT Follow up for treatment of MDS  HISTORY OF PRESENTING ILLNESS:  Yesenia Hart is a  77 y.o.  female who recently moved from [place] to [place].  She used to live at [place] with husband.  Accompanied by daughter, [name] who is a respiratory therapist at [employer].  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  Patient presented to emergency room in January 2019 with complaints of intermittent rectal bleeding and increased to have a hemoglobin of 3 and platelet counts was 59,000.  Received 5 units of PRBC and was discharged home with hemoglobin of 10.  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  Cauterization was deferred due to thrombocytopenia. Aspirin was discontinued due to GI bleeding. Peripheral blood flow, SPEP negative, no obvious signs of B12 deficiency.  Ultrasound of the abdomen showed mild fatty liver.  On October 04, 2017, patient underwent bone marrow biopsy. Bone marrow core biopsy, bone marrow aspirate clot, bone marrow aspirate smears showed  single linage dysplasia.  Less than 5% bone marrow blasts and no circulating blast. Iron storage is nearly absent Peripheral blood smear showed moderate and anisopoikilocytosis Marrow aspirate normal in  adenoid maturation Cytogenetics performed at the [facility] in [place] showed 46,xx, t(3;17;16)(p21;q21;q24), add (5) (q11,2), idic(22)(p11,2)[18]/46,xx[2].  20 metaphases, lites were normal and 18 metaphases 5 q. deletion, it has translocation involving  chromosome of 3, 17, 16, and iso-di centric 22.  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  Patient received IV iron infusion on moving to [place].  She has not had any treatment for MDS done due to the move.  Today patient is accompanied by her daughter to the clinic.  She reports feeling tired, no weight loss.  She reports easy bruising.  Intermittent epistaxis which spontaneously resolved after applying pressure denies any hematochezia, hematemesis, hemoptysis.  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 Although patient does have 5q deletion which if isolated, is a good cytogentic group, she carried other cytogentic abnormalities which increases her IPSS-R score.   INTERVAL HISTORY Yesenia Hart is a 77 y.o. female who has above history reviewed by me present for assessment prior to MDS treatment with azacitidine. Today she is going to start cycle 3 azacitidine. Problems and complaints are listed below. #Thrombocytopenia, stable.  No acute bleeding events.  Easy bruising is better. #Fatigue chronic which is stable.  Subjective reports slightly better.  appetite is fair.  Review of Systems  Constitutional: Positive for malaise/fatigue. Negative for chills, fever and weight loss.  HENT: Negative for congestion, ear discharge, ear pain, nosebleeds, sinus pain and sore throat.   Eyes: Negative for double vision, photophobia, pain, discharge and redness.  Respiratory: Negative for cough, hemoptysis, sputum production, shortness of breath and wheezing.   Cardiovascular: Negative for chest pain, palpitations, orthopnea, claudication and leg swelling.  Gastrointestinal: Negative for abdominal pain, blood in stool, constipation, diarrhea, heartburn, melena, nausea and vomiting.  Genitourinary: Negative for dysuria, flank pain, frequency and hematuria.  Musculoskeletal: Negative for back pain,  myalgias and neck pain.  Skin: Negative for itching and rash.  Neurological: Negative for dizziness, tingling, tremors, focal weakness, weakness and headaches.  Endo/Heme/Allergies: Negative for environmental allergies. Does not bruise/bleed easily.  Psychiatric/Behavioral: Negative for depression and hallucinations. The patient is not nervous/anxious.     MEDICAL HISTORY:  Past Medical History:  Diagnosis Date  . Anemia   . Clotting disorder ([place])   . Diabetes mellitus without complication ([place])   . IBS (irritable bowel syndrome)   . Iron deficiency anemia due to chronic blood loss 12/12/2017  . MDS (myelodysplastic syndrome) ([place])   . Thyroid disease     SURGICAL HISTORY: Past Surgical History:  Procedure Laterality Date  . APPENDECTOMY    . breast biopsy 78'    . CHOLECYSTECTOMY    . CORONARY ANGIOPLASTY WITH STENT PLACEMENT  2016   in [place]: Social History   Socioeconomic History  . Marital status: Married    Spouse name: Not on file  . Number of children: Not on file  . Years of education: Not on file  . Highest education level: Not on file  Occupational History  . Occupation: retired  Scientific laboratory technician  . Financial resource strain: Not on file  . Food insecurity:    Worry: Not on file    Inability: Not on file  . Transportation needs:    Medical: Not on file    Non-medical: Not on file  Tobacco Use  . Smoking status: Former Smoker    Packs/day: 1.00    Types: Cigarettes    Last attempt to quit: 1999    Years since quitting: 20.6  . Smokeless tobacco: Never Used  Substance and Sexual Activity  . Alcohol use: Not Currently  . Drug use: Never  . Sexual activity: Yes  Lifestyle  . Physical activity:    Days per week: Not on file    Minutes per session: Not on file  . Stress: Not on file  Relationships  . Social connections:    Talks on phone: Not on file    Gets together: Not on file    Attends religious service: Not on file     Active member of club or organization: Not on file    Attends meetings of clubs or organizations: Not on file    Relationship status: Not on file  . Intimate partner violence:    Fear of current or ex partner: Not on file    Emotionally abused: Not on file    Physically abused: Not on file    Forced sexual activity: Not on file  Other Topics Concern  . Not on file  Social History Narrative  . Not on file    FAMILY HISTORY: Family History  Adopted: Yes  Family history unknown: Yes    ALLERGIES:  is allergic to acarbose; demerol [meperidine hcl]; meperidine; and pioglitazone.  MEDICATIONS:  Current Outpatient Medications  Medication Sig Dispense Refill  . atorvastatin (LIPITOR) 40 MG tablet Take 1 tablet (40 mg total) by mouth daily. 90 tablet 1  . azaCITIDine in lactated ringers infusion Inject 100 mg/m2 into the vein daily.    . furosemide (LASIX) 20 MG tablet Take 1 tablet (20 mg total) by mouth daily. 30 tablet 3  . glimepiride (AMARYL) 4 MG tablet Take 1 tablet (4 mg total) by mouth daily. 90 tablet 1  . Lancets (ONETOUCH ULTRASOFT) lancets Use as instructed 100 each 12  . levothyroxine (SYNTHROID, LEVOTHROID) 50 MCG tablet Take 1 tablet (50 mcg  total) by mouth daily before breakfast. 90 tablet 1  . NOVOLOG MIX 70/30 FLEXPEN (70-30) 100 UNIT/ML FlexPen Inject 0.1-0.15 mLs (10-15 Units total) into the skin 2 (two) times daily with a meal. (Patient taking differently: Inject 10 Units into the skin 2 (two) times daily with a meal. ) 21 mL 1  . nystatin (MYCOSTATIN/NYSTOP) powder nystatin 100,000 unit/gram topical powder   1 app by topical route.    . ondansetron (ZOFRAN) 8 MG tablet Take 1 tablet (8 mg total) by mouth 2 (two) times daily as needed (Nausea or vomiting). 30 tablet 1  . ONE TOUCH ULTRA TEST test strip 1 each by Other route 2 (two) times daily. Use as instructed 200 each 1  . pantoprazole (PROTONIX) 40 MG tablet Take 1 tablet (40 mg total) by mouth daily. 90 tablet 1    . prochlorperazine (COMPAZINE) 10 MG tablet Take 1 tablet (10 mg total) by mouth every 6 (six) hours as needed (Nausea or vomiting). 30 tablet 1  . quinapril (ACCUPRIL) 20 MG tablet Take 1 tablet (20 mg total) by mouth daily. 90 tablet 1  . sulfaSALAzine (AZULFIDINE) 500 MG EC tablet Take 1 tablet (500 mg total) by mouth at bedtime. 90 tablet 1  . ULTICARE MICRO PEN NEEDLES 32G X 4 MM MISC      No current facility-administered medications for this visit.      PHYSICAL EXAMINATION: ECOG PERFORMANCE STATUS: 1 - Symptomatic but completely ambulatory Vitals:   02/11/18 1332  BP: 110/64  Pulse: (!) 55  Resp: 18  Temp: 97.8 F (36.6 C)   Filed Weights   02/11/18 1332  Weight: 156 lb 6.4 oz (70.9 kg)    Physical Exam  Constitutional: She is oriented to person, place, and time. She appears well-developed and well-nourished. No distress.  HENT:  Head: Normocephalic and atraumatic.  Right Ear: External ear normal.  Left Ear: External ear normal.  Mouth/Throat: Oropharynx is clear and moist.  Eyes: Pupils are equal, round, and reactive to light. Conjunctivae and EOM are normal. No scleral icterus.  Pale conjunctivae.   Neck: Normal range of motion. Neck supple.  Cardiovascular: Normal rate, regular rhythm and normal heart sounds.  Pulmonary/Chest: Effort normal and breath sounds normal. No respiratory distress. She has no wheezes. She has no rales. She exhibits no tenderness.  Abdominal: Soft. Bowel sounds are normal. She exhibits no distension and no mass. There is no tenderness.  Musculoskeletal: Normal range of motion. She exhibits no edema or deformity.  Lymphadenopathy:    She has no cervical adenopathy.  Neurological: She is alert and oriented to person, place, and time. No cranial nerve deficit. Coordination normal.  Skin: Skin is warm and dry. No rash noted. No erythema.  Bruises.   Psychiatric: She has a normal mood and affect. Her behavior is normal. Thought content  normal.     LABORATORY DATA:  I have reviewed the data as listed Lab Results  Component Value Date   WBC 3.1 (L) 02/11/2018   HGB 9.9 (L) 02/11/2018   HCT 31.4 (L) 02/11/2018   MCV 92.6 02/11/2018   PLT 78 (L) 02/11/2018   Recent Labs    01/14/18 0927 01/28/18 0925 02/11/18 1249  NA 139 139 138  K 3.9 4.0 4.2  CL 109 108 108  CO2 _0 GLUCOSE 143* 154* 106*  BUN _1 CREATININE 1.04* 0.99 1.12*  CALCIUM 8.7* 8.7* 9.0  GFRNONAA 50* 53* 46*  GFRAA 58* >60  53*  PROT 6.4* 6.2* 6.3*  ALBUMIN 3.4* 3.5 3.6  AST _0 ALT _1 ALKPHOS 110 121 108  BILITOT 0.7 0.7 0.5       ASSESSMENT & PLAN:  1. 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 Thrombocytopenia ([place])   3. 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 Encounter for antineoplastic chemotherapy   # MDS: Status post 2 cycles of Vidaza.  Tolerated treatment well Counts acceptable Labs reviewed and discussed with patient.  proceed with cycle 3 D1-5 Vidaza.   # Thrombocytopenia: Platelet count was 78,000.  Indicating possible treatment response. # Anemia, stable. Monitor.  # Fatigue: chronic, stable. Monitor.  Repeat CBC in 2 weeks.  Repeat CBC CMP in 4 weeks prior to the assessment for Cycle 4 Vidaza treatment.  All questions were answered. The patient knows to call the clinic with any problems questions or concerns. Total face to face encounter time for this patient visit was 25  min. >50% of the time was  spent in counseling and coordination of care.  Return of visit: 4 weeks.   Earlie Server, MD, PhD Hematology Oncology [facility] at [facility] Pager- [phone] 02/11/2018

## 2018-02-12 ENCOUNTER — Inpatient Hospital Stay: Payer: Medicare Other

## 2018-02-12 ENCOUNTER — Telehealth: Payer: Self-pay | Admitting: Oncology

## 2018-02-12 VITALS — BP 104/60 | HR 59 | Temp 97.0°F | Resp 20

## 2018-02-12 DIAGNOSIS — D6189 Other specified aplastic anemias and other bone marrow failure syndromes: Secondary | ICD-10-CM | POA: Diagnosis not present

## 2018-02-12 DIAGNOSIS — Z7689 Persons encountering health services in other specified circumstances: Secondary | ICD-10-CM | POA: Diagnosis not present

## 2018-02-12 DIAGNOSIS — Z5111 Encounter for antineoplastic chemotherapy: Secondary | ICD-10-CM | POA: Diagnosis not present

## 2018-02-12 DIAGNOSIS — D46C Myelodysplastic syndrome with isolated del(5q) chromosomal abnormality: Secondary | ICD-10-CM | POA: Diagnosis not present

## 2018-02-12 DIAGNOSIS — D619 Aplastic anemia, unspecified: Secondary | ICD-10-CM | POA: Diagnosis not present

## 2018-02-12 DIAGNOSIS — D696 Thrombocytopenia, unspecified: Secondary | ICD-10-CM | POA: Diagnosis not present

## 2018-02-12 MED ORDER — ONDANSETRON HCL 4 MG PO TABS: 8.0000 mg | ORAL_TABLET | Freq: Once | ORAL | Status: DC

## 2018-02-12 MED ORDER — AZACITIDINE CHEMO SQ INJECTION
75.0000 mg/m2 | Freq: Once | INTRAMUSCULAR | Status: AC
  Administered 2018-02-12: 132.5 mg via SUBCUTANEOUS
  Filled 2018-02-12: qty 5.3

## 2018-02-12 NOTE — Telephone Encounter (Signed)
Called patient to confirm [facility] appointment on 8/12 and to advise her to take Claritin to decrease bone pain, per Dr. Tasia Catchings.

## 2018-02-13 ENCOUNTER — Inpatient Hospital Stay: Payer: Medicare Other

## 2018-02-13 VITALS — BP 112/65 | HR 81 | Temp 97.2°F | Resp 18

## 2018-02-13 DIAGNOSIS — D6189 Other specified aplastic anemias and other bone marrow failure syndromes: Secondary | ICD-10-CM | POA: Diagnosis not present

## 2018-02-13 DIAGNOSIS — D619 Aplastic anemia, unspecified: Secondary | ICD-10-CM | POA: Diagnosis not present

## 2018-02-13 DIAGNOSIS — Z7689 Persons encountering health services in other specified circumstances: Secondary | ICD-10-CM | POA: Diagnosis not present

## 2018-02-13 DIAGNOSIS — D696 Thrombocytopenia, unspecified: Secondary | ICD-10-CM | POA: Diagnosis not present

## 2018-02-13 DIAGNOSIS — D46C Myelodysplastic syndrome with isolated del(5q) chromosomal abnormality: Secondary | ICD-10-CM | POA: Diagnosis not present

## 2018-02-13 DIAGNOSIS — Z5111 Encounter for antineoplastic chemotherapy: Secondary | ICD-10-CM | POA: Diagnosis not present

## 2018-02-13 MED ORDER — ONDANSETRON HCL 4 MG PO TABS: 8.0000 mg | ORAL_TABLET | Freq: Once | ORAL | Status: DC

## 2018-02-13 MED ORDER — AZACITIDINE CHEMO SQ INJECTION
75.0000 mg/m2 | Freq: Once | INTRAMUSCULAR | Status: AC
  Administered 2018-02-13: 132.5 mg via SUBCUTANEOUS
  Filled 2018-02-13: qty 5.3

## 2018-02-14 ENCOUNTER — Inpatient Hospital Stay: Payer: Medicare Other

## 2018-02-14 VITALS — BP 140/71 | HR 78 | Temp 97.6°F | Resp 18

## 2018-02-14 DIAGNOSIS — D619 Aplastic anemia, unspecified: Secondary | ICD-10-CM | POA: Diagnosis not present

## 2018-02-14 DIAGNOSIS — D696 Thrombocytopenia, unspecified: Secondary | ICD-10-CM | POA: Diagnosis not present

## 2018-02-14 DIAGNOSIS — D46C Myelodysplastic syndrome with isolated del(5q) chromosomal abnormality: Secondary | ICD-10-CM

## 2018-02-14 DIAGNOSIS — Z5111 Encounter for antineoplastic chemotherapy: Secondary | ICD-10-CM | POA: Diagnosis not present

## 2018-02-14 DIAGNOSIS — D6189 Other specified aplastic anemias and other bone marrow failure syndromes: Secondary | ICD-10-CM | POA: Diagnosis not present

## 2018-02-14 DIAGNOSIS — Z7689 Persons encountering health services in other specified circumstances: Secondary | ICD-10-CM | POA: Diagnosis not present

## 2018-02-14 MED ORDER — ONDANSETRON HCL 4 MG PO TABS: 8.0000 mg | ORAL_TABLET | Freq: Once | ORAL | Status: DC

## 2018-02-14 MED ORDER — AZACITIDINE CHEMO SQ INJECTION
75.0000 mg/m2 | Freq: Once | INTRAMUSCULAR | Status: AC
  Administered 2018-02-14: 132.5 mg via SUBCUTANEOUS
  Filled 2018-02-14: qty 5.3

## 2018-02-15 ENCOUNTER — Ambulatory Visit: Payer: Medicare Other

## 2018-02-15 ENCOUNTER — Inpatient Hospital Stay: Payer: Medicare Other

## 2018-02-15 VITALS — BP 136/69 | HR 70 | Temp 97.6°F | Resp 18

## 2018-02-15 DIAGNOSIS — D46C Myelodysplastic syndrome with isolated del(5q) chromosomal abnormality: Secondary | ICD-10-CM

## 2018-02-15 DIAGNOSIS — D6189 Other specified aplastic anemias and other bone marrow failure syndromes: Secondary | ICD-10-CM | POA: Diagnosis not present

## 2018-02-15 DIAGNOSIS — D619 Aplastic anemia, unspecified: Secondary | ICD-10-CM | POA: Diagnosis not present

## 2018-02-15 DIAGNOSIS — Z7689 Persons encountering health services in other specified circumstances: Secondary | ICD-10-CM | POA: Diagnosis not present

## 2018-02-15 DIAGNOSIS — D696 Thrombocytopenia, unspecified: Secondary | ICD-10-CM | POA: Diagnosis not present

## 2018-02-15 DIAGNOSIS — Z5111 Encounter for antineoplastic chemotherapy: Secondary | ICD-10-CM | POA: Diagnosis not present

## 2018-02-15 MED ORDER — ONDANSETRON HCL 4 MG PO TABS: 8.0000 mg | ORAL_TABLET | Freq: Once | ORAL | Status: DC

## 2018-02-15 MED ORDER — AZACITIDINE CHEMO SQ INJECTION
75.0000 mg/m2 | Freq: Once | INTRAMUSCULAR | Status: AC
  Administered 2018-02-15: 132.5 mg via SUBCUTANEOUS
  Filled 2018-02-15: qty 5.3

## 2018-02-18 ENCOUNTER — Inpatient Hospital Stay: Payer: Medicare Other

## 2018-02-18 DIAGNOSIS — Z7689 Persons encountering health services in other specified circumstances: Secondary | ICD-10-CM | POA: Diagnosis not present

## 2018-02-18 DIAGNOSIS — D46C Myelodysplastic syndrome with isolated del(5q) chromosomal abnormality: Secondary | ICD-10-CM | POA: Diagnosis not present

## 2018-02-18 DIAGNOSIS — D696 Thrombocytopenia, unspecified: Secondary | ICD-10-CM | POA: Diagnosis not present

## 2018-02-18 DIAGNOSIS — Z5111 Encounter for antineoplastic chemotherapy: Secondary | ICD-10-CM | POA: Diagnosis not present

## 2018-02-18 DIAGNOSIS — D619 Aplastic anemia, unspecified: Secondary | ICD-10-CM | POA: Diagnosis not present

## 2018-02-18 DIAGNOSIS — D6189 Other specified aplastic anemias and other bone marrow failure syndromes: Secondary | ICD-10-CM | POA: Diagnosis not present

## 2018-02-18 MED ORDER — PEGFILGRASTIM-CBQV 6 MG/0.6ML ~~LOC~~ SOSY
6.0000 mg | PREFILLED_SYRINGE | Freq: Once | SUBCUTANEOUS | Status: AC
  Administered 2018-02-18: 6 mg via SUBCUTANEOUS

## 2018-02-22 DIAGNOSIS — E119 Type 2 diabetes mellitus without complications: Secondary | ICD-10-CM | POA: Diagnosis not present

## 2018-02-22 DIAGNOSIS — Z794 Long term (current) use of insulin: Secondary | ICD-10-CM | POA: Diagnosis not present

## 2018-02-22 DIAGNOSIS — H2511 Age-related nuclear cataract, right eye: Secondary | ICD-10-CM | POA: Diagnosis not present

## 2018-02-22 DIAGNOSIS — Z961 Presence of intraocular lens: Secondary | ICD-10-CM | POA: Diagnosis not present

## 2018-02-22 LAB — HM DIABETES EYE EXAM

## 2018-02-25 ENCOUNTER — Inpatient Hospital Stay: Payer: Medicare Other

## 2018-02-25 DIAGNOSIS — Z5111 Encounter for antineoplastic chemotherapy: Secondary | ICD-10-CM | POA: Diagnosis not present

## 2018-02-25 DIAGNOSIS — Z7689 Persons encountering health services in other specified circumstances: Secondary | ICD-10-CM | POA: Diagnosis not present

## 2018-02-25 DIAGNOSIS — D46C Myelodysplastic syndrome with isolated del(5q) chromosomal abnormality: Secondary | ICD-10-CM

## 2018-02-25 DIAGNOSIS — D619 Aplastic anemia, unspecified: Secondary | ICD-10-CM | POA: Diagnosis not present

## 2018-02-25 DIAGNOSIS — D696 Thrombocytopenia, unspecified: Secondary | ICD-10-CM | POA: Diagnosis not present

## 2018-02-25 DIAGNOSIS — D6189 Other specified aplastic anemias and other bone marrow failure syndromes: Secondary | ICD-10-CM | POA: Diagnosis not present

## 2018-02-25 LAB — COMPREHENSIVE METABOLIC PANEL
ALBUMIN: 3.6 g/dL (ref 3.5–5.0)
ALT: 17 U/L (ref 0–44)
ANION GAP: 10 (ref 5–15)
AST: 21 U/L (ref 15–41)
Alkaline Phosphatase: 146 U/L — ABNORMAL HIGH (ref 38–126)
BILIRUBIN TOTAL: 0.8 mg/dL (ref 0.3–1.2)
BUN: 18 mg/dL (ref 8–23)
CO2: 23 mmol/L (ref 22–32)
Calcium: 8.5 mg/dL — ABNORMAL LOW (ref 8.9–10.3)
Chloride: 107 mmol/L (ref 98–111)
Creatinine, Ser: 0.94 mg/dL (ref 0.44–1.00)
GFR calc Af Amer: >60 mL/min (ref >60)
GFR calc non Af Amer: 57 mL/min — ABNORMAL LOW (ref >60)
GLUCOSE: 127 mg/dL — AB (ref 70–99)
POTASSIUM: 3.9 mmol/L (ref 3.5–5.1)
SODIUM: 140 mmol/L (ref 135–145)
Total Protein: 6.4 g/dL — ABNORMAL LOW (ref 6.5–8.1)

## 2018-02-25 LAB — SAMPLE TO BLOOD BANK

## 2018-02-25 LAB — CBC WITH DIFFERENTIAL/PLATELET
Basophils Absolute: 0.1 10*3/uL (ref 0–0.1)
Basophils Relative: 1 %
EOS PCT: 2 %
Eosinophils Absolute: 0.2 10*3/uL (ref 0–0.7)
HCT: 31.9 % — ABNORMAL LOW (ref 35.0–47.0)
Hemoglobin: 10.2 g/dL — ABNORMAL LOW (ref 12.0–16.0)
Lymphocytes Relative: 12 %
Lymphs Abs: 1 10*3/uL (ref 1.0–3.6)
MCH: 29.5 pg (ref 26.0–34.0)
MCHC: 31.9 g/dL — AB (ref 32.0–36.0)
MCV: 92.4 fL (ref 80.0–100.0)
MONO ABS: 0.2 10*3/uL (ref 0.2–0.9)
MONOS PCT: 2 %
Neutro Abs: 7 10*3/uL — ABNORMAL HIGH (ref 1.4–6.5)
Neutrophils Relative %: 83 %
Platelets: 55 10*3/uL — ABNORMAL LOW (ref 150–440)
RBC: 3.45 MIL/uL — ABNORMAL LOW (ref 3.80–5.20)
RDW: 24.5 % — AB (ref 11.5–14.5)
WBC: 8.5 10*3/uL (ref 3.6–11.0)

## 2018-02-28 ENCOUNTER — Encounter: Payer: Self-pay | Admitting: Oncology

## 2018-02-28 ENCOUNTER — Encounter: Payer: Self-pay | Admitting: Internal Medicine

## 2018-03-12 ENCOUNTER — Inpatient Hospital Stay: Payer: Medicare Other

## 2018-03-12 ENCOUNTER — Other Ambulatory Visit: Payer: Self-pay

## 2018-03-12 ENCOUNTER — Inpatient Hospital Stay: Payer: Medicare Other | Attending: Oncology

## 2018-03-12 ENCOUNTER — Inpatient Hospital Stay (HOSPITAL_BASED_OUTPATIENT_CLINIC_OR_DEPARTMENT_OTHER): Payer: Medicare Other | Admitting: Oncology

## 2018-03-12 ENCOUNTER — Encounter: Payer: Self-pay | Admitting: Oncology

## 2018-03-12 VITALS — BP 128/68 | HR 71 | Temp 96.8°F | Resp 18 | Wt 155.9 lb

## 2018-03-12 DIAGNOSIS — Z5111 Encounter for antineoplastic chemotherapy: Secondary | ICD-10-CM | POA: Insufficient documentation

## 2018-03-12 DIAGNOSIS — M79641 Pain in right hand: Secondary | ICD-10-CM | POA: Insufficient documentation

## 2018-03-12 DIAGNOSIS — D701 Agranulocytosis secondary to cancer chemotherapy: Secondary | ICD-10-CM | POA: Diagnosis not present

## 2018-03-12 DIAGNOSIS — D696 Thrombocytopenia, unspecified: Secondary | ICD-10-CM | POA: Diagnosis not present

## 2018-03-12 DIAGNOSIS — D46C Myelodysplastic syndrome with isolated del(5q) chromosomal abnormality: Secondary | ICD-10-CM

## 2018-03-12 DIAGNOSIS — D6189 Other specified aplastic anemias and other bone marrow failure syndromes: Secondary | ICD-10-CM

## 2018-03-12 DIAGNOSIS — D649 Anemia, unspecified: Secondary | ICD-10-CM

## 2018-03-12 DIAGNOSIS — M79642 Pain in left hand: Secondary | ICD-10-CM

## 2018-03-12 DIAGNOSIS — Z87891 Personal history of nicotine dependence: Secondary | ICD-10-CM

## 2018-03-12 DIAGNOSIS — R5383 Other fatigue: Secondary | ICD-10-CM

## 2018-03-12 LAB — COMPREHENSIVE METABOLIC PANEL
ALBUMIN: 3.7 g/dL (ref 3.5–5.0)
ALT: 20 U/L (ref 0–44)
AST: 24 U/L (ref 15–41)
Alkaline Phosphatase: 112 U/L (ref 38–126)
Anion gap: 8 (ref 5–15)
BUN: 24 mg/dL — AB (ref 8–23)
CHLORIDE: 106 mmol/L (ref 98–111)
CO2: 23 mmol/L (ref 22–32)
CREATININE: 1.08 mg/dL — AB (ref 0.44–1.00)
Calcium: 8.8 mg/dL — ABNORMAL LOW (ref 8.9–10.3)
GFR calc Af Amer: 55 mL/min — ABNORMAL LOW (ref >60)
GFR calc non Af Amer: 48 mL/min — ABNORMAL LOW (ref >60)
Glucose, Bld: 115 mg/dL — ABNORMAL HIGH (ref 70–99)
Potassium: 4.3 mmol/L (ref 3.5–5.1)
Sodium: 137 mmol/L (ref 135–145)
Total Bilirubin: 0.4 mg/dL (ref 0.3–1.2)
Total Protein: 6.7 g/dL (ref 6.5–8.1)

## 2018-03-12 LAB — CBC WITH DIFFERENTIAL/PLATELET
BASOS ABS: 0 10*3/uL (ref 0–0.1)
Basophils Relative: 1 %
Eosinophils Absolute: 0.1 10*3/uL (ref 0–0.7)
Eosinophils Relative: 2 %
HEMATOCRIT: 32.3 % — AB (ref 35.0–47.0)
HEMOGLOBIN: 10.4 g/dL — AB (ref 12.0–16.0)
Lymphocytes Relative: 24 %
Lymphs Abs: 1 10*3/uL (ref 1.0–3.6)
MCH: 30 pg (ref 26.0–34.0)
MCHC: 32.1 g/dL (ref 32.0–36.0)
MCV: 93.5 fL (ref 80.0–100.0)
MONO ABS: 0.3 10*3/uL (ref 0.2–0.9)
Monocytes Relative: 6 %
Neutro Abs: 2.6 10*3/uL (ref 1.4–6.5)
Neutrophils Relative %: 67 %
Platelets: 44 10*3/uL — ABNORMAL LOW (ref 150–440)
RBC: 3.45 MIL/uL — AB (ref 3.80–5.20)
RDW: 24 % — ABNORMAL HIGH (ref 11.5–14.5)
WBC: 4 10*3/uL (ref 3.6–11.0)

## 2018-03-12 LAB — SAMPLE TO BLOOD BANK

## 2018-03-12 MED ORDER — AZACITIDINE CHEMO SQ INJECTION
75.0000 mg/m2 | Freq: Once | INTRAMUSCULAR | Status: AC
  Administered 2018-03-12: 132.5 mg via SUBCUTANEOUS
  Filled 2018-03-12: qty 5.3

## 2018-03-12 MED ORDER — ONDANSETRON HCL 4 MG PO TABS
8.0000 mg | ORAL_TABLET | Freq: Once | ORAL | Status: DC
  Filled 2018-03-12: qty 2

## 2018-03-12 NOTE — Progress Notes (Signed)
Hematology/Oncology Follow up note [facility] Telephone:(336) [phone] Fax:(336) [phone]   Patient Care Team: Glean Hess, MD as PCP - General (Internal Medicine)  REFERRING PROVIDER: Glean Hess, MD  Previous Oncologist Dr.Srujitha Murkutla REASON FOR VISIT Follow up for treatment of MDS  HISTORY OF PRESENTING ILLNESS:  Yesenia Hart is a  77 y.o.  female who recently moved from [place] to [place].  She used to live at [place] with husband.  Accompanied by daughter, [name] who is a respiratory therapist at [employer].  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  Patient presented to emergency room in January 2019 with complaints of intermittent rectal bleeding and increased to have a hemoglobin of 3 and platelet counts was 59,000.  Received 5 units of PRBC and was discharged home with hemoglobin of 10.  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  Cauterization was deferred due to thrombocytopenia. Aspirin was discontinued due to GI bleeding. Peripheral blood flow, SPEP negative, no obvious signs of B12 deficiency.  Ultrasound of the abdomen showed mild fatty liver.  On October 04, 2017, patient underwent bone marrow biopsy. Bone marrow core biopsy, bone marrow aspirate clot, bone marrow aspirate smears showed  single linage dysplasia.  Less than 5% bone marrow blasts and no circulating blast. Iron storage is nearly absent Peripheral blood smear showed moderate and anisopoikilocytosis Marrow aspirate normal in  adenoid maturation Cytogenetics performed at the [facility] in [place] showed 46,xx, t(3;17;16)(p21;q21;q24), add (5) (q11,2), idic(22)(p11,2)[18]/46,xx[2].  20 metaphases, lites were normal and 18 metaphases 5 q. deletion, it has translocation involving  chromosome of 3, 17, 16, and iso-di centric 22.  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  Patient received IV iron infusion on moving to [place].  She has not had any treatment for MDS done due to the move.  Today patient is accompanied by her daughter to the clinic.  She reports feeling tired, no weight loss.  She reports easy bruising.  Intermittent epistaxis which spontaneously resolved after applying pressure denies any hematochezia, hematemesis, hemoptysis.  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 Although patient does have 5q deletion which if isolated, is a good cytogentic group, she carried other cytogentic abnormalities which increases her IPSS-R score.   INTERVAL HISTORY Yesenia Hart is a 77 y.o. female who has above history reviewed by me presents for assessment prior to MDS treatment with Azacitidine.  Today she will start Cycle 4 treatment.   She reports overall she tolerates the treatment. Denies any nausea or vomiting, fever or chills.  Easy bruising is better no active bleeding.  Reports "her hands wake her up at night, not sure if related to chemotherapy'.  Chronic arthritis.  #Thrombocytopenia, stable. #Fatigue chronic stable.    Review of Systems  Constitutional: Positive for malaise/fatigue. Negative for chills, fever and weight loss.  HENT: Negative for congestion, ear discharge, ear pain, nosebleeds, sinus pain and sore throat.   Eyes: Negative for double vision, photophobia, pain, discharge and redness.  Respiratory: Negative for cough, hemoptysis, sputum production, shortness of breath and wheezing.   Cardiovascular: Negative for chest pain, palpitations, orthopnea, claudication and leg swelling.  Gastrointestinal: Negative for abdominal pain, blood in stool, constipation, diarrhea, heartburn, melena, nausea and vomiting.  Genitourinary: Negative  for dysuria,  flank pain, frequency and hematuria.  Musculoskeletal: Negative for back pain, myalgias and neck pain.  Skin: Negative for itching and rash.  Neurological: Negative for dizziness, tingling, tremors, focal weakness, weakness and headaches.  Endo/Heme/Allergies: Negative for environmental allergies. Does not bruise/bleed easily.  Psychiatric/Behavioral: Negative for depression and hallucinations. The patient is not nervous/anxious.     MEDICAL HISTORY:  Past Medical History:  Diagnosis Date  . Anemia   . Clotting disorder ([place])   . Diabetes mellitus without complication ([place])   . IBS (irritable bowel syndrome)   . Iron deficiency anemia due to chronic blood loss 12/12/2017  . MDS (myelodysplastic syndrome) ([place])   . Thyroid disease     SURGICAL HISTORY: Past Surgical History:  Procedure Laterality Date  . APPENDECTOMY    . breast biopsy 32'    . CHOLECYSTECTOMY    . CORONARY ANGIOPLASTY WITH STENT PLACEMENT  2016   in [place]: Social History   Socioeconomic History  . Marital status: Married    Spouse name: Not on file  . Number of children: Not on file  . Years of education: Not on file  . Highest education level: Not on file  Occupational History  . Occupation: retired  Scientific laboratory technician  . Financial resource strain: Not on file  . Food insecurity:    Worry: Not on file    Inability: Not on file  . Transportation needs:    Medical: Not on file    Non-medical: Not on file  Tobacco Use  . Smoking status: Former Smoker    Packs/day: 1.00    Types: Cigarettes    Last attempt to quit: 1999    Years since quitting: 20.6  . Smokeless tobacco: Never Used  Substance and Sexual Activity  . Alcohol use: Not Currently  . Drug use: Never  . Sexual activity: Yes  Lifestyle  . Physical activity:    Days per week: Not on file    Minutes per session: Not on file  . Stress: Not on file  Relationships  . Social connections:    Talks on phone: Not on file     Gets together: Not on file    Attends religious service: Not on file    Active member of club or organization: Not on file    Attends meetings of clubs or organizations: Not on file    Relationship status: Not on file  . Intimate partner violence:    Fear of current or ex partner: Not on file    Emotionally abused: Not on file    Physically abused: Not on file    Forced sexual activity: Not on file  Other Topics Concern  . Not on file  Social History Narrative  . Not on file    FAMILY HISTORY: Family History  Adopted: Yes  Family history unknown: Yes    ALLERGIES:  is allergic to acarbose; demerol [meperidine hcl]; meperidine; and pioglitazone.  MEDICATIONS:  Current Outpatient Medications  Medication Sig Dispense Refill  . atorvastatin (LIPITOR) 40 MG tablet Take 1 tablet (40 mg total) by mouth daily. 90 tablet 1  . azaCITIDine in lactated ringers infusion Inject 100 mg/m2 into the vein daily.    . furosemide (LASIX) 20 MG tablet Take 1 tablet (20 mg total) by mouth daily. 30 tablet 3  . glimepiride (AMARYL) 4 MG tablet Take 1 tablet (4 mg total) by mouth daily. 90 tablet 1  . Lancets (ONETOUCH ULTRASOFT) lancets  Use as instructed 100 each 12  . levothyroxine (SYNTHROID, LEVOTHROID) 50 MCG tablet Take 1 tablet (50 mcg total) by mouth daily before breakfast. 90 tablet 1  . NOVOLOG MIX 70/30 FLEXPEN (70-30) 100 UNIT/ML FlexPen Inject 0.1-0.15 mLs (10-15 Units total) into the skin 2 (two) times daily with a meal. (Patient taking differently: Inject 10 Units into the skin 2 (two) times daily with a meal. ) 21 mL 1  . nystatin (MYCOSTATIN/NYSTOP) powder nystatin 100,000 unit/gram topical powder   1 app by topical route.    . ondansetron (ZOFRAN) 8 MG tablet Take 1 tablet (8 mg total) by mouth 2 (two) times daily as needed (Nausea or vomiting). 30 tablet 1  . ONE TOUCH ULTRA TEST test strip 1 each by Other route 2 (two) times daily. Use as instructed 200 each 1  . pantoprazole  (PROTONIX) 40 MG tablet Take 1 tablet (40 mg total) by mouth daily. 90 tablet 1  . prochlorperazine (COMPAZINE) 10 MG tablet Take 1 tablet (10 mg total) by mouth every 6 (six) hours as needed (Nausea or vomiting). 30 tablet 1  . quinapril (ACCUPRIL) 20 MG tablet Take 1 tablet (20 mg total) by mouth daily. 90 tablet 1  . sulfaSALAzine (AZULFIDINE) 500 MG EC tablet Take 1 tablet (500 mg total) by mouth at bedtime. 90 tablet 1  . ULTICARE MICRO PEN NEEDLES 32G X 4 MM MISC      No current facility-administered medications for this visit.    Facility-Administered Medications Ordered in Other Visits  Medication Dose Route Frequency Provider Last Rate Last Dose  . azaCITIDine (VIDAZA) chemo injection 132.5 mg  75 mg/m2 (Order-Specific) Subcutaneous Once Earlie Server, MD      . ondansetron [facility]) tablet 8 mg  8 mg Oral Once Earlie Server, MD         PHYSICAL EXAMINATION: ECOG PERFORMANCE STATUS: 1 - Symptomatic but completely ambulatory Vitals:   03/12/18 0949  BP: 128/68  Pulse: 71  Resp: 18  Temp: (!) 96.8 F (36 C)   Filed Weights   03/12/18 0949  Weight: 155 lb 14.4 oz (70.7 kg)    Physical Exam  Constitutional: She is oriented to person, place, and time. She appears well-developed and well-nourished. No distress.  HENT:  Head: Normocephalic and atraumatic.  Right Ear: External ear normal.  Left Ear: External ear normal.  Mouth/Throat: Oropharynx is clear and moist.  Eyes: Pupils are equal, round, and reactive to light. Conjunctivae and EOM are normal. No scleral icterus.  Pale conjunctivae.   Neck: Normal range of motion. Neck supple.  Cardiovascular: Normal rate, regular rhythm and normal heart sounds.  Pulmonary/Chest: Effort normal and breath sounds normal. No respiratory distress. She has no wheezes. She has no rales. She exhibits no tenderness.  Abdominal: Soft. Bowel sounds are normal. She exhibits no distension and no mass. There is no tenderness.  Musculoskeletal: Normal  range of motion. She exhibits no edema or deformity.  Lymphadenopathy:    She has no cervical adenopathy.  Neurological: She is alert and oriented to person, place, and time. No cranial nerve deficit. Coordination normal.  Skin: Skin is warm and dry. No rash noted. No erythema.  Bruises.   Psychiatric: She has a normal mood and affect. Her behavior is normal. Thought content normal.     LABORATORY DATA:  I have reviewed the data as listed Lab Results  Component Value Date   WBC 4.0 03/12/2018   HGB 10.4 (L) 03/12/2018   HCT 32.3 (  L) 03/12/2018   MCV 93.5 03/12/2018   PLT 44 (L) 03/12/2018   Recent Labs    02/11/18 1249 02/25/18 1239 03/12/18 1040  NA 138 140 137  K 4.2 3.9 4.3  CL 108 107 106  CO2 _0 GLUCOSE 106* 127* 115*  BUN 20 18 24*  CREATININE 1.12* 0.94 1.08*  CALCIUM 9.0 8.5* 8.8*  GFRNONAA 46* 57* 48*  GFRAA 53* >60 55*  PROT 6.3* 6.4* 6.7  ALBUMIN 3.6 3.6 3.7  AST _1 ALT _2 ALKPHOS 108 146* 112  BILITOT 0.5 0.8 0.4       ASSESSMENT & PLAN:  1. 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 Thrombocytopenia ([place])   3. Encounter for antineoplastic chemotherapy   4. 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 Other fatigue   6. Bilateral hand pain     # MDS: S/p three cycles of Vidaza.  Tolerating well.  Labs were reviewed and discussed with patient. Counts are acceptable to proceed with cycle 4 D1-5 Vidaza.    # Thrombocytopenia: Platelet count fluctuates but better than her pre-treatment .  Indicating possible treatment response. # Anemia, stable. Monitor.  # Fatigue: chronic, stable. monitor  Repeat CBC CMP in 4 weeks prior to the assessment for Cycle 4 Vidaza treatment. # Hand pain at night, discussed with patient that this may not related to chemotherapy. I recommend Calcium and Vitamin D supplements. ? Osteoporosis? Advise patient to follow up with primary care physician.   All questions were  answered. The patient knows to call the clinic with any problems questions or concerns.  Total face to face encounter time for this patient visit was 25 min. >50% of the time was  spent in counseling and coordination of care.  Return of visit: 4 weeks.   Earlie Server, MD, PhD Hematology Oncology [facility] at [facility] Pager- [phone] 03/12/2018

## 2018-03-12 NOTE — Progress Notes (Signed)
Patient here for follow up. C/o pain to both hands, it has started ever since she started getting vidaza.

## 2018-03-13 ENCOUNTER — Other Ambulatory Visit: Payer: Self-pay

## 2018-03-13 ENCOUNTER — Encounter: Payer: Self-pay | Admitting: Emergency Medicine

## 2018-03-13 ENCOUNTER — Emergency Department
Admission: EM | Admit: 2018-03-13 | Discharge: 2018-03-13 | Disposition: A | Discharge status: Home / Self Care | Payer: Medicare Other | Attending: Emergency Medicine | Admitting: Emergency Medicine

## 2018-03-13 ENCOUNTER — Telehealth: Payer: Self-pay | Admitting: *Deleted

## 2018-03-13 ENCOUNTER — Inpatient Hospital Stay: Payer: Medicare Other

## 2018-03-13 DIAGNOSIS — R55 Syncope and collapse: Secondary | ICD-10-CM

## 2018-03-13 DIAGNOSIS — R1010 Upper abdominal pain, unspecified: Secondary | ICD-10-CM | POA: Insufficient documentation

## 2018-03-13 DIAGNOSIS — R0789 Other chest pain: Secondary | ICD-10-CM | POA: Diagnosis not present

## 2018-03-13 DIAGNOSIS — J45909 Unspecified asthma, uncomplicated: Secondary | ICD-10-CM | POA: Insufficient documentation

## 2018-03-13 DIAGNOSIS — R079 Chest pain, unspecified: Secondary | ICD-10-CM | POA: Insufficient documentation

## 2018-03-13 DIAGNOSIS — R1110 Vomiting, unspecified: Secondary | ICD-10-CM | POA: Insufficient documentation

## 2018-03-13 DIAGNOSIS — Z955 Presence of coronary angioplasty implant and graft: Secondary | ICD-10-CM | POA: Diagnosis not present

## 2018-03-13 DIAGNOSIS — E119 Type 2 diabetes mellitus without complications: Secondary | ICD-10-CM | POA: Diagnosis not present

## 2018-03-13 DIAGNOSIS — Z87891 Personal history of nicotine dependence: Secondary | ICD-10-CM | POA: Diagnosis not present

## 2018-03-13 DIAGNOSIS — E039 Hypothyroidism, unspecified: Secondary | ICD-10-CM | POA: Diagnosis not present

## 2018-03-13 DIAGNOSIS — R42 Dizziness and giddiness: Secondary | ICD-10-CM | POA: Insufficient documentation

## 2018-03-13 DIAGNOSIS — D696 Thrombocytopenia, unspecified: Secondary | ICD-10-CM | POA: Diagnosis not present

## 2018-03-13 DIAGNOSIS — Z79899 Other long term (current) drug therapy: Secondary | ICD-10-CM | POA: Insufficient documentation

## 2018-03-13 DIAGNOSIS — R001 Bradycardia, unspecified: Secondary | ICD-10-CM | POA: Diagnosis not present

## 2018-03-13 DIAGNOSIS — I10 Essential (primary) hypertension: Secondary | ICD-10-CM | POA: Diagnosis not present

## 2018-03-13 DIAGNOSIS — Z794 Long term (current) use of insulin: Secondary | ICD-10-CM | POA: Diagnosis not present

## 2018-03-13 LAB — URINALYSIS, COMPLETE (UACMP) WITH MICROSCOPIC
Bilirubin Urine: NEGATIVE
Glucose, UA: NEGATIVE mg/dL
Hgb urine dipstick: NEGATIVE
KETONES UR: NEGATIVE mg/dL
Leukocytes, UA: NEGATIVE
Nitrite: NEGATIVE
Protein, ur: NEGATIVE mg/dL
SPECIFIC GRAVITY, URINE: 1.006 (ref 1.005–1.030)
pH: 6 (ref 5.0–8.0)

## 2018-03-13 LAB — COMPREHENSIVE METABOLIC PANEL
ALK PHOS: 106 U/L (ref 38–126)
ALT: 17 U/L (ref 0–44)
AST: 24 U/L (ref 15–41)
Albumin: 3.5 g/dL (ref 3.5–5.0)
Anion gap: 10 (ref 5–15)
BUN: 23 mg/dL (ref 8–23)
CALCIUM: 8.8 mg/dL — AB (ref 8.9–10.3)
CO2: 22 mmol/L (ref 22–32)
CREATININE: 1.12 mg/dL — AB (ref 0.44–1.00)
Chloride: 108 mmol/L (ref 98–111)
GFR, EST AFRICAN AMERICAN: 53 mL/min — AB (ref >60)
GFR, EST NON AFRICAN AMERICAN: 46 mL/min — AB (ref >60)
Glucose, Bld: 148 mg/dL — ABNORMAL HIGH (ref 70–99)
Potassium: 4.1 mmol/L (ref 3.5–5.1)
Sodium: 140 mmol/L (ref 135–145)
Total Bilirubin: 0.9 mg/dL (ref 0.3–1.2)
Total Protein: 6.3 g/dL — ABNORMAL LOW (ref 6.5–8.1)

## 2018-03-13 LAB — CBC WITH DIFFERENTIAL/PLATELET
Basophils Absolute: 0 10*3/uL (ref 0–0.1)
Basophils Relative: 1 %
EOS ABS: 0.1 10*3/uL (ref 0–0.7)
EOS PCT: 3 %
HCT: 32.8 % — ABNORMAL LOW (ref 35.0–47.0)
Hemoglobin: 10.7 g/dL — ABNORMAL LOW (ref 12.0–16.0)
Lymphocytes Relative: 18 %
Lymphs Abs: 0.7 10*3/uL — ABNORMAL LOW (ref 1.0–3.6)
MCH: 30.4 pg (ref 26.0–34.0)
MCHC: 32.7 g/dL (ref 32.0–36.0)
MCV: 92.9 fL (ref 80.0–100.0)
MONO ABS: 0.3 10*3/uL (ref 0.2–0.9)
Monocytes Relative: 7 %
Neutro Abs: 3 10*3/uL (ref 1.4–6.5)
Neutrophils Relative %: 71 %
PLATELETS: UNDETERMINED 10*3/uL (ref 150–440)
RBC: 3.53 MIL/uL — ABNORMAL LOW (ref 3.80–5.20)
RDW: 24 % — AB (ref 11.5–14.5)
WBC: 4.1 10*3/uL (ref 3.6–11.0)

## 2018-03-13 LAB — PLATELET COUNT: PLATELETS: 41 10*3/uL — AB (ref 150–440)

## 2018-03-13 LAB — PATHOLOGIST SMEAR REVIEW

## 2018-03-13 LAB — TROPONIN I

## 2018-03-13 MED ORDER — SODIUM CHLORIDE 0.9 % IV BOLUS
1000.0000 mL | Freq: Once | INTRAVENOUS | Status: AC
  Administered 2018-03-13: 1000 mL via INTRAVENOUS

## 2018-03-13 NOTE — ED Notes (Signed)
ED Provider at bedside. 

## 2018-03-13 NOTE — ED Notes (Signed)
Per Coralyn Mark in lab, platelets "in process." Will change status on computer to reflect. MD aware. Pt and family updated.

## 2018-03-13 NOTE — ED Provider Notes (Signed)
[facility] Emergency Department Provider Note  ____________________________________________  Time seen: Approximately 8:44 AM  I have reviewed the triage vital signs and the nursing notes.   HISTORY  Chief Complaint Near Syncope   HPI Yesenia Hart is a 77 y.o. 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  Patient reports that she was in her usual state of health this morning when she woke up.  She was sitting on the toilet and having a bowel movement when she started feeling nauseous and clammy.  She became dizzy and almost passed out.  She denies full loss of consciousness.  She reports a sharp left-sided lower chest/upper abdominal pain that lasted maybe a minute.  The pain was severe and patient reports that she was hyperventilating when the pain came on. She reports that both her feet felt numb which has now resolved.  She reports full resolution of that pain.  She currently denies abdominal pain, nausea, vomiting, fever, chills, chest pain, shortness of breath.  Patient reports one prior episode of near syncope in the past.  Past Medical History:  Diagnosis Date  . Anemia   . Clotting disorder ([place])   . Diabetes mellitus without complication ([place])   . IBS (irritable bowel syndrome)   . Iron deficiency anemia due to chronic blood loss 12/12/2017  . MDS (myelodysplastic syndrome) ([place])   . MDS (myelodysplastic syndrome) ([place])   . Thyroid disease     Patient Active Problem List   Diagnosis Date Noted  . Drug-induced neutropenia ([place]) 01/14/2018  . Type 2 diabetes mellitus without complication, with long-term current use of insulin ([place]) 12/17/2017  . Fever blister 12/17/2017  . Tobacco use disorder, moderate, in sustained remission 12/17/2017  . Thrombocytopenia ([place]) 12/12/2017  . Goals of care, counseling/discussion 12/12/2017  . Other fatigue 12/12/2017  . Asthma 11/30/2017    . Cataract 11/30/2017  . Chronic obstructive lung disease ([place]) 11/30/2017  . 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 Edema 11/30/2017  . Essential hypertension 11/30/2017  . Hypothyroidism 11/30/2017  . Mixed hyperlipidemia 11/30/2017  . Osteoarthritis 11/30/2017  . 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 APPENDECTOMY    . breast biopsy 47'    . CHOLECYSTECTOMY    . CORONARY ANGIOPLASTY WITH STENT PLACEMENT  2016   in [place]    Prior to Admission medications   Medication Sig Start Date End Date Taking? Authorizing Provider  azaCITIDine in lactated ringers infusion Inject 100 mg/m2 into the vein daily.   Yes [provider]  furosemide (LASIX) 20 MG tablet Take 1 tablet (20 mg total) by mouth daily. 12/17/17  Yes Glean Hess, MD  glimepiride (AMARYL) 4 MG tablet Take 1 tablet (4 mg total) by mouth daily. 12/17/17  Yes Glean Hess, MD  levothyroxine (SYNTHROID, LEVOTHROID) 50 MCG tablet Take 1 tablet (50 mcg total) by mouth daily before breakfast. 12/17/17  Yes Glean Hess, MD  NOVOLOG MIX 70/30 FLEXPEN (70-30) 100 UNIT/ML FlexPen Inject 0.1-0.15 mLs (10-15 Units total) into the skin 2 (two) times daily with a meal. Patient taking differently: Inject 10 Units into the skin 2 (two) times daily with a meal.  12/19/17  Yes Glean Hess, MD  pantoprazole (PROTONIX) 40 MG tablet Take 1 tablet (40 mg total) by mouth daily. 12/17/17  Yes  Glean Hess, MD  quinapril (ACCUPRIL) 20 MG tablet Take 1 tablet (20 mg total) by mouth daily. 12/17/17  Yes Glean Hess, MD  sulfaSALAzine (AZULFIDINE) 500 MG EC tablet Take 1 tablet (500 mg total) by mouth at bedtime. 12/17/17  Yes Glean Hess, MD  atorvastatin (LIPITOR) 40 MG tablet Take 1 tablet (40 mg total) by mouth daily. 12/17/17   Glean Hess, MD  Lancets [facility] ULTRASOFT)  lancets Use as instructed 02/07/18   Glean Hess, MD  nystatin (MYCOSTATIN/NYSTOP) powder nystatin 100,000 unit/gram topical powder   1 app by topical route. 08/09/17   [provider]  ondansetron (ZOFRAN) 8 MG tablet Take 1 tablet (8 mg total) by mouth 2 (two) times daily as needed (Nausea or vomiting). 12/13/17   Earlie Server, MD  ONE TOUCH ULTRA TEST test strip 1 each by Other route 2 (two) times daily. Use as instructed 12/17/17   Glean Hess, MD  prochlorperazine (COMPAZINE) 10 MG tablet Take 1 tablet (10 mg total) by mouth every 6 (six) hours as needed (Nausea or vomiting). 12/13/17   Earlie Server, MD  Flossie Buffy MICRO PEN NEEDLES 32G X 4 MM MISC  12/14/17   [provider]    Allergies Acarbose; Demerol [meperidine hcl]; Meperidine; and Pioglitazone  Family History  Adopted: Yes  Family history unknown: Yes    Social History Social History   Tobacco Use  . Smoking status: Former Smoker    Packs/day: 1.00    Types: Cigarettes    Last attempt to quit: 1999    Years since quitting: 20.6  . Smokeless tobacco: Never Used  Substance Use Topics  . Alcohol use: Not Currently  . Drug use: Never    Review of Systems  Constitutional: Negative for fever. + near syncope Eyes: Negative for visual changes. ENT: Negative for sore throat. Neck: No neck pain  Cardiovascular: + L sharp short lived chest pain. Respiratory: Negative for shortness of breath. Gastrointestinal: Negative for abdominal pain, vomiting or diarrhea. Genitourinary: Negative for dysuria. Musculoskeletal: Negative for back pain. Skin: Negative for rash. Neurological: Negative for headaches, weakness or numbness. Psych: No SI or HI  ____________________________________________   PHYSICAL EXAM:  VITAL SIGNS: ED Triage Vitals  Enc Vitals Group     BP 03/13/18 0823 120/76     Pulse Rate 03/13/18 0823 (!) 52     Resp 03/13/18 0823 16     Temp 03/13/18 0823 (!) 97.4 F (36.3 C)     Temp Source  03/13/18 0823 Oral     SpO2 03/13/18 0823 100 %     Weight 03/13/18 0824 155 lb (70.3 kg)     Height 03/13/18 0824 5' 4"  (1.626 m)     Head Circumference --      Peak Flow --      Pain Score 03/13/18 0824 0     Pain Loc --      Pain Edu? --      Excl. in [place]? --     Constitutional: Alert and oriented. Well appearing and in no apparent distress. HEENT:      Head: Normocephalic and atraumatic.         Eyes: Conjunctivae are normal. Sclera is non-icteric.       Mouth/Throat: Mucous membranes are moist.       Neck: Supple with no signs of meningismus. Cardiovascular: Regular rate and rhythm. No murmurs, gallops, or rubs. 2+ symmetrical distal pulses are present in all extremities. No  JVD. Respiratory: Normal respiratory effort. Lungs are clear to auscultation bilaterally. No wheezes, crackles, or rhonchi.  Gastrointestinal: Soft, non tender, and non distended with positive bowel sounds. No rebound or guarding. Genitourinary: No CVA tenderness. Musculoskeletal: Nontender with normal range of motion in all extremities. No edema, cyanosis, or erythema of extremities. Neurologic: Normal speech and language. Face is symmetric. Moving all extremities.  Intact strength and sensation.  No gross focal neurologic deficits are appreciated. Skin: Skin is warm, dry and intact. No rash noted. Psychiatric: Mood and affect are normal. Speech and behavior are normal.  ____________________________________________   LABS (all labs ordered are listed, but only abnormal results are displayed)  Labs Reviewed  CBC WITH DIFFERENTIAL/PLATELET - Abnormal; Notable for the following components:      Result Value   RBC 3.53 (*)    Hemoglobin 10.7 (*)    HCT 32.8 (*)    RDW 24.0 (*)    Platelets 6 (*)    Lymphs Abs 0.7 (*)    All other components within normal limits  COMPREHENSIVE METABOLIC PANEL - Abnormal; Notable for the following components:   Glucose, Bld 148 (*)    Creatinine, Ser 1.12 (*)     Calcium 8.8 (*)    Total Protein 6.3 (*)    GFR calc non Af Amer 46 (*)    GFR calc Af Amer 53 (*)    All other components within normal limits  URINALYSIS, COMPLETE (UACMP) WITH MICROSCOPIC - Abnormal; Notable for the following components:   Color, Urine YELLOW (*)    APPearance CLEAR (*)    Bacteria, UA RARE (*)    All other components within normal limits  PLATELET COUNT - Abnormal; Notable for the following components:   Platelets 41 (*)    All other components within normal limits  TROPONIN I  PATHOLOGIST SMEAR REVIEW   ____________________________________________  EKG  ED ECG REPORT I, Rudene Re, the attending physician, personally viewed and interpreted this ECG.  Sinus bradycardia, rate 53, normal intervals, normal axis, no STE or depressions, no evidence of HOCM, AV block, delta wave, ARVD, prolonged QTc, WPW, or [place]. TWI in aVL and V2. No prior for comparison.   ____________________________________________  RADIOLOGY  none  ____________________________________________   PROCEDURES  Procedure(s) performed: None Procedures Critical Care performed:  None ____________________________________________   INITIAL IMPRESSION / ASSESSMENT AND PLAN / ED COURSE   77 y.o. 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  Patient has no headache and is completely neurologically intact with no signs of stroke or no concerns for subarachnoid hemorrhage.  EKG showed no evidence of ischemia or dysrhythmias.  Labs are pending to rule out dehydration, AKI, electrolyte abnormalities.  Will monitor patient on telemetry for any signs of arrhythmia.  Orthostatic vital signs are positive with heart rate increasing from 50-65 and blood pressure dropping from 116/30 to 100/35 when she goes from laying to standing.  Will give IV fluids.  Clinical Course as of Mar 14 1331   Wed Mar 13, 2018  1053 Labs showing platelets of 6.  I spoke with patient's oncologist Dr. Tasia Catchings who recommended pathologist review of the smear to make sure this is a true thrombocytopenia and if it is admission for platelets transfusion.  Spoke with the pathologist who reports platelet clumping and therefore the results of 6 is erroneous.  She recommended sending a new sample on  a blue top which the nurse is working on right now so we can get a true platelet count.  Will reassess with the new platelet result   [CV]    Clinical Course User Index [CV] Alfred Levins, [place], MD   _________________________ 1:28 PM on 03/13/2018 -----------------------------------------  Repeat platelet count showing platelets of 41.  Discussed again with patient's oncologist Dr. Tasia Catchings who agrees with discharge home.  Patient missed her with chemotherapy today and she recommended just coming in for her treatment tomorrow.  At this time patient's etiology of her near syncopal event is most likely vasovagal with mild dehydration/orthostasis. Patient remains extremely well appearing with no further complaints. Patient monitor on telemetry for several hours with no evidence of dysrhythmias.  Will discharge home to the care of her family.  Discussed return precautions for chest pain, headache, neuro deficits, or any further episodes of near syncope or syncope.  As part of my medical decision making, I reviewed the following data within the [place] History obtained from family, Nursing notes reviewed and incorporated, Labs reviewed , EKG interpreted , Old EKG reviewed, Old chart reviewed, Radiograph reviewed , A consult was requested and obtained from this/these consultant(s) Oncology, Notes from prior ED visits and [place] Controlled Substance Database    Pertinent labs & imaging results that were available during my care of the patient were reviewed by me and considered in my medical decision making (see chart for  details).    ____________________________________________   FINAL CLINICAL IMPRESSION(S) / ED DIAGNOSES  Final diagnoses:  Near syncope  Thrombocytopenia ([place])      NEW MEDICATIONS STARTED DURING THIS VISIT:  ED Discharge Orders    None       Note:  This document was prepared using Dragon voice recognition software and may include unintentional dictation errors.    Alfred Levins, [place], MD 03/13/18 1332

## 2018-03-13 NOTE — Telephone Encounter (Signed)
Received message from answering service that daughter called reporting that patient had chemo last week and that she has nausea, lightheaded and feels like she is going to pass out.  I attempted to call patient with no answer and then see that patient is in the ED

## 2018-03-13 NOTE — ED Notes (Signed)
Pt ambulatory with assistance to wheel chair upon discharge. Pt and daughter verbalized understanding of discharge instructions and follow-up care. VSS. Skin warm and dry. A&O x4.

## 2018-03-13 NOTE — ED Notes (Signed)
Per lab, ok to only send blue top for platelets and smear.

## 2018-03-13 NOTE — ED Triage Notes (Addendum)
Pt arrives via EMS from [facility] s/p near syncopal episode after having bowel movement at home. Per EMS, pt too weak to get herself up off the toilet. Pt reporting her legs were numb at the time and had nausea. Short period of time with sharp left-sided chest pain. No chest pain now. Hx stent placed. Hx DM. CBG from EMS 181.   Pt denying any pain at this time. Still c/o numbness in both legs (mostly on left, per pt).   Hx MDS. Had chemotherapy shots in abdomen yesterday.

## 2018-03-14 ENCOUNTER — Ambulatory Visit: Payer: Medicare Other

## 2018-03-14 ENCOUNTER — Inpatient Hospital Stay: Payer: Medicare Other

## 2018-03-14 VITALS — BP 147/70 | HR 77 | Temp 97.7°F | Resp 18

## 2018-03-14 DIAGNOSIS — D701 Agranulocytosis secondary to cancer chemotherapy: Secondary | ICD-10-CM | POA: Diagnosis not present

## 2018-03-14 DIAGNOSIS — M79642 Pain in left hand: Secondary | ICD-10-CM | POA: Diagnosis not present

## 2018-03-14 DIAGNOSIS — D46C Myelodysplastic syndrome with isolated del(5q) chromosomal abnormality: Secondary | ICD-10-CM | POA: Diagnosis not present

## 2018-03-14 DIAGNOSIS — M79641 Pain in right hand: Secondary | ICD-10-CM | POA: Diagnosis not present

## 2018-03-14 DIAGNOSIS — Z5111 Encounter for antineoplastic chemotherapy: Secondary | ICD-10-CM | POA: Diagnosis not present

## 2018-03-14 DIAGNOSIS — D696 Thrombocytopenia, unspecified: Secondary | ICD-10-CM | POA: Diagnosis not present

## 2018-03-14 MED ORDER — AZACITIDINE CHEMO SQ INJECTION
75.0000 mg/m2 | Freq: Once | INTRAMUSCULAR | Status: AC
  Administered 2018-03-14: 132.5 mg via SUBCUTANEOUS
  Filled 2018-03-14: qty 5.3

## 2018-03-14 MED ORDER — ONDANSETRON HCL 4 MG PO TABS: 8.0000 mg | ORAL_TABLET | Freq: Once | ORAL | Status: DC

## 2018-03-15 ENCOUNTER — Inpatient Hospital Stay: Payer: Medicare Other

## 2018-03-15 ENCOUNTER — Ambulatory Visit (INDEPENDENT_AMBULATORY_CARE_PROVIDER_SITE_OTHER): Payer: Medicare Other | Admitting: Internal Medicine

## 2018-03-15 ENCOUNTER — Encounter: Payer: Self-pay | Admitting: Internal Medicine

## 2018-03-15 ENCOUNTER — Ambulatory Visit: Payer: Medicare Other

## 2018-03-15 VITALS — BP 121/62 | HR 80 | Temp 99.5°F | Resp 20

## 2018-03-15 VITALS — BP 112/64 | HR 74 | Ht 64.0 in | Wt 158.0 lb

## 2018-03-15 DIAGNOSIS — K5730 Diverticulosis of large intestine without perforation or abscess without bleeding: Secondary | ICD-10-CM | POA: Diagnosis not present

## 2018-03-15 DIAGNOSIS — S9032XA Contusion of left foot, initial encounter: Secondary | ICD-10-CM

## 2018-03-15 DIAGNOSIS — E119 Type 2 diabetes mellitus without complications: Secondary | ICD-10-CM

## 2018-03-15 DIAGNOSIS — E034 Atrophy of thyroid (acquired): Secondary | ICD-10-CM | POA: Diagnosis not present

## 2018-03-15 DIAGNOSIS — D696 Thrombocytopenia, unspecified: Secondary | ICD-10-CM | POA: Diagnosis not present

## 2018-03-15 DIAGNOSIS — Z794 Long term (current) use of insulin: Secondary | ICD-10-CM

## 2018-03-15 DIAGNOSIS — Z1231 Encounter for screening mammogram for malignant neoplasm of breast: Secondary | ICD-10-CM

## 2018-03-15 DIAGNOSIS — D46C Myelodysplastic syndrome with isolated del(5q) chromosomal abnormality: Secondary | ICD-10-CM

## 2018-03-15 DIAGNOSIS — E785 Hyperlipidemia, unspecified: Secondary | ICD-10-CM | POA: Diagnosis not present

## 2018-03-15 DIAGNOSIS — I10 Essential (primary) hypertension: Secondary | ICD-10-CM | POA: Diagnosis not present

## 2018-03-15 DIAGNOSIS — I2510 Atherosclerotic heart disease of native coronary artery without angina pectoris: Secondary | ICD-10-CM

## 2018-03-15 DIAGNOSIS — D701 Agranulocytosis secondary to cancer chemotherapy: Secondary | ICD-10-CM | POA: Diagnosis not present

## 2018-03-15 DIAGNOSIS — Z5111 Encounter for antineoplastic chemotherapy: Secondary | ICD-10-CM | POA: Diagnosis not present

## 2018-03-15 DIAGNOSIS — E1169 Type 2 diabetes mellitus with other specified complication: Secondary | ICD-10-CM

## 2018-03-15 DIAGNOSIS — Z23 Encounter for immunization: Secondary | ICD-10-CM

## 2018-03-15 DIAGNOSIS — M79641 Pain in right hand: Secondary | ICD-10-CM | POA: Diagnosis not present

## 2018-03-15 DIAGNOSIS — Z1239 Encounter for other screening for malignant neoplasm of breast: Secondary | ICD-10-CM

## 2018-03-15 DIAGNOSIS — M79642 Pain in left hand: Secondary | ICD-10-CM | POA: Diagnosis not present

## 2018-03-15 MED ORDER — AZACITIDINE CHEMO SQ INJECTION
75.0000 mg/m2 | Freq: Once | INTRAMUSCULAR | Status: AC
  Administered 2018-03-15: 132.5 mg via SUBCUTANEOUS
  Filled 2018-03-15: qty 5.3

## 2018-03-15 MED ORDER — QUINAPRIL HCL 20 MG PO TABS: 20.0000 mg | ORAL_TABLET | Freq: Every day | ORAL | 1 refills | Status: DC

## 2018-03-15 MED ORDER — ONDANSETRON HCL 4 MG PO TABS: 8.0000 mg | ORAL_TABLET | Freq: Once | ORAL | Status: DC

## 2018-03-15 NOTE — Progress Notes (Signed)
Date:  03/15/2018   Name:  Yesenia Hart   DOB:  1941-05-22   MRN:  [id number]   Chief Complaint: Hypertension and Diabetes Diabetes  She presents for her follow-up diabetic visit. She has type 2 diabetes mellitus. Her disease course has been stable. Pertinent negatives for hypoglycemia include no dizziness or headaches. Pertinent negatives for diabetes include no chest pain and no foot paresthesias. Symptoms are stable. Current diabetic treatment includes insulin injections (Novolog Mix 70/30 and glimepiride). Her weight is stable. She monitors blood glucose at home 1-2 x per day. Her breakfast blood glucose is taken between 6-7 am. Her breakfast blood glucose range is generally 110-130 mg/dl. Her dinner blood glucose is taken between 5-6 pm. Her dinner blood glucose range is generally 140-180 mg/dl. An ACE inhibitor/angiotensin II receptor blocker is being taken. Eye exam is current.  Hypertension  This is a chronic problem. The problem is controlled. Pertinent negatives include no chest pain, headaches, palpitations or shortness of breath. Past treatments include ACE inhibitors and diuretics. The current treatment provides significant improvement. Identifiable causes of hypertension include a thyroid problem.  Thyroid Problem  Presents for follow-up visit. Symptoms include diarrhea. Patient reports no palpitations. The symptoms have been stable. Her past medical history is significant for hyperlipidemia.  Hyperlipidemia  This is a chronic problem. Pertinent negatives include no chest pain or shortness of breath. Current antihyperlipidemic treatment includes statins. Risk factors for coronary artery disease include diabetes mellitus and dyslipidemia (s/p stent 2016).  foot pain - hit the side of her left foot against the van.  It is sore and slightly swollen.  She thinks it is bruised.  She can walk with mild pain. MDS - getting infusions every month with some positive response to the  therapy. She tolerates it well but had some mild dehydration earlier this week - had to get IVF at the ED.  She also gets loose stools with the infusions - apparently the Oncologist knows about this side effect which she says will go away in a few days. CAD - s/p stent in 2016.  Per patient, no ongoing follow up is needed.  She takes atorvastatin daily but no aspirin.   Lab Results  Component Value Date   HGBA1C 5.5 12/24/2017   Lab Results  Component Value Date   CREATININE 1.12 (H) 03/13/2018   BUN 23 03/13/2018   NA 140 03/13/2018   K 4.1 03/13/2018   CL 108 03/13/2018   CO2 22 03/13/2018   No results found for: CHOL, HDL, LDLCALC, LDLDIRECT, TRIG, CHOLHDL  Lab Results  Component Value Date   TSH 1.806 12/24/2017      Review of Systems  Constitutional: Negative for unexpected weight change.  HENT: Negative for trouble swallowing.   Respiratory: Negative for cough, chest tightness, shortness of breath and wheezing.   Cardiovascular: Positive for leg swelling. Negative for chest pain and palpitations.  Gastrointestinal: Positive for diarrhea. Negative for anal bleeding and blood in stool.  Genitourinary: Negative for difficulty urinating and hematuria.  Musculoskeletal: Positive for arthralgias, gait problem and joint swelling.  Skin: Negative for rash.  Neurological: Negative for dizziness, numbness and headaches.  Psychiatric/Behavioral: Negative for sleep disturbance.    Patient Active Problem List   Diagnosis Date Noted  . 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 CAD (coronary artery disease), native coronary artery 03/15/2018  . Drug-induced neutropenia ([place]) 01/14/2018  . Type 2 diabetes mellitus without complication, with long-term current use of  insulin ([place]) 12/17/2017  . Fever blister 12/17/2017  . Tobacco use disorder, moderate, in sustained remission 12/17/2017  . Thrombocytopenia ([place]) 12/12/2017  . Goals of care,  counseling/discussion 12/12/2017  . Other fatigue 12/12/2017  . Asthma 11/30/2017  . Cataract 11/30/2017  . Chronic obstructive lung disease ([place]) 11/30/2017  . 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 Edema 11/30/2017  . Essential hypertension 11/30/2017  . Hyperlipidemia associated with type 2 diabetes mellitus ([place]) 11/30/2017  . Osteoarthritis 11/30/2017  . 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 Acarbose Diarrhea  . Demerol [Meperidine Hcl]   . Meperidine Nausea And Vomiting  . Pioglitazone     Other reaction(s): Edema    Past Surgical History:  Procedure Laterality Date  . APPENDECTOMY    . breast biopsy 1'    . CHOLECYSTECTOMY    . CORONARY ANGIOPLASTY WITH STENT PLACEMENT  2016   in [place] History   Tobacco Use  . Smoking status: Former Smoker    Packs/day: 1.00    Types: Cigarettes    Last attempt to quit: 1999    Years since quitting: 20.6  . Smokeless tobacco: Never Used  Substance Use Topics  . Alcohol use: Not Currently  . Drug use: Never     Medication list has been reviewed and updated.  Current Meds  Medication Sig  . atorvastatin (LIPITOR) 40 MG tablet Take 1 tablet (40 mg total) by mouth daily.  Marland Kitchen azaCITIDine in lactated ringers infusion Inject 100 mg/m2 into the vein daily.  . furosemide (LASIX) 20 MG tablet Take 1 tablet (20 mg total) by mouth daily.  Marland Kitchen glimepiride (AMARYL) 4 MG tablet Take 1 tablet (4 mg total) by mouth daily.  . Lancets (ONETOUCH ULTRASOFT) lancets Use as instructed  . levothyroxine (SYNTHROID, LEVOTHROID) 50 MCG tablet Take 1 tablet (50 mcg total) by mouth daily before breakfast.  . NOVOLOG MIX 70/30 FLEXPEN (70-30) 100 UNIT/ML FlexPen Inject 0.1-0.15 mLs (10-15 Units total) into the skin 2 (two) times daily with a meal. (Patient taking differently: Inject 10 Units into the skin 2 (two) times daily with a  meal. )  . ondansetron (ZOFRAN) 8 MG tablet Take 1 tablet (8 mg total) by mouth 2 (two) times daily as needed (Nausea or vomiting).  . ONE TOUCH ULTRA TEST test strip 1 each by Other route 2 (two) times daily. Use as instructed  . pantoprazole (PROTONIX) 40 MG tablet Take 1 tablet (40 mg total) by mouth daily.  . quinapril (ACCUPRIL) 20 MG tablet Take 1 tablet (20 mg total) by mouth daily.  Marland Kitchen sulfaSALAzine (AZULFIDINE) 500 MG EC tablet Take 1 tablet (500 mg total) by mouth at bedtime.  Marland Kitchen ULTICARE MICRO PEN NEEDLES 32G X 4 MM MISC     PHQ 2/9 Scores 12/17/2017  PHQ - 2 Score 0    Physical Exam  Constitutional: She is oriented to person, place, and time. She appears well-developed. No distress.  HENT:  Head: Normocephalic and atraumatic.  Neck: Normal range of motion. Neck supple.  Cardiovascular: Normal rate, regular rhythm and normal heart sounds.  Pulmonary/Chest: Effort normal and breath sounds normal. No respiratory distress.  Abdominal: Soft.  Musculoskeletal: She exhibits edema.       Feet:  Lymphadenopathy:    She has no cervical adenopathy.  Neurological: She is alert and oriented to person, place, and time.  Skin: Skin is  warm and dry. No rash noted.  Psychiatric: She has a normal mood and affect. Her behavior is normal. Thought content normal.  Nursing note and vitals reviewed.   BP 112/64 (BP Location: Right Arm, Patient Position: Sitting, Cuff Size: Normal)   Pulse 74   Ht 5' 4"  (1.626 m)   Wt 158 lb (71.7 kg)   SpO2 94%   BMI 27.12 kg/m   Assessment and Plan: 1. Essential hypertension controlled - quinapril (ACCUPRIL) 20 MG tablet; Take 1 tablet (20 mg total) by mouth daily.  Dispense: 90 tablet; Refill: 1  2. Hypothyroidism due to acquired atrophy of thyroid Supplemented Last labs normal  3. Type 2 diabetes mellitus without complication, with long-term current use of insulin ([facility]) Continue current insulin and oral agents Encourage some exercise such as  walking if able - Hemoglobin A1c  4. Hyperlipidemia associated with type 2 diabetes mellitus ([place]) On statin therapy - Lipid panel  5. 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 Coronary artery disease involving native coronary artery of native heart without angina pectoris S/p stent Continue statin  7. Diverticular disease of colon stable  8. Contusion of left foot, initial encounter Elevate, no evidence of fracture  9. Need for influenza vaccination - Flu vaccine HIGH DOSE PF  10. Breast cancer screening Pt will need to sign release of films from [place] - [facility] 3D SCREEN BREAST BILATERAL; Future   Meds ordered this encounter  Medications  . quinapril (ACCUPRIL) 20 MG tablet    Sig: Take 1 tablet (20 mg total) by mouth daily.    Dispense:  90 tablet    Refill:  1    Partially dictated using Editor, commissioning. Any errors are unintentional.  Halina Maidens, MD [facility]  03/15/2018   There are no diagnoses linked to this encounter.

## 2018-03-16 ENCOUNTER — Emergency Department
Admission: EM | Admit: 2018-03-16 | Discharge: 2018-03-16 | Disposition: A | Discharge status: Home / Self Care | Payer: Medicare Other | Attending: Emergency Medicine | Admitting: Emergency Medicine

## 2018-03-16 ENCOUNTER — Other Ambulatory Visit: Payer: Self-pay

## 2018-03-16 DIAGNOSIS — Z79899 Other long term (current) drug therapy: Secondary | ICD-10-CM | POA: Insufficient documentation

## 2018-03-16 DIAGNOSIS — E119 Type 2 diabetes mellitus without complications: Secondary | ICD-10-CM | POA: Diagnosis not present

## 2018-03-16 DIAGNOSIS — Z87891 Personal history of nicotine dependence: Secondary | ICD-10-CM | POA: Diagnosis not present

## 2018-03-16 DIAGNOSIS — J449 Chronic obstructive pulmonary disease, unspecified: Secondary | ICD-10-CM | POA: Insufficient documentation

## 2018-03-16 DIAGNOSIS — I10 Essential (primary) hypertension: Secondary | ICD-10-CM | POA: Insufficient documentation

## 2018-03-16 DIAGNOSIS — R040 Epistaxis: Secondary | ICD-10-CM | POA: Insufficient documentation

## 2018-03-16 DIAGNOSIS — I2510 Atherosclerotic heart disease of native coronary artery without angina pectoris: Secondary | ICD-10-CM | POA: Diagnosis not present

## 2018-03-16 DIAGNOSIS — E039 Hypothyroidism, unspecified: Secondary | ICD-10-CM | POA: Diagnosis not present

## 2018-03-16 DIAGNOSIS — Z955 Presence of coronary angioplasty implant and graft: Secondary | ICD-10-CM | POA: Insufficient documentation

## 2018-03-16 DIAGNOSIS — Z794 Long term (current) use of insulin: Secondary | ICD-10-CM | POA: Insufficient documentation

## 2018-03-16 LAB — LIPID PANEL
CHOLESTEROL TOTAL: 101 mg/dL (ref 100–199)
Chol/HDL Ratio: 1.6 ratio (ref 0.0–4.4)
HDL: 64 mg/dL (ref >39)
LDL CALC: 26 mg/dL (ref 0–99)
Triglycerides: 57 mg/dL (ref 0–149)
VLDL Cholesterol Cal: 11 mg/dL (ref 5–40)

## 2018-03-16 LAB — CBC
HEMATOCRIT: 30 % — AB (ref 35.0–47.0)
HEMOGLOBIN: 9.8 g/dL — AB (ref 12.0–16.0)
MCH: 30.5 pg (ref 26.0–34.0)
MCHC: 32.6 g/dL (ref 32.0–36.0)
MCV: 93.4 fL (ref 80.0–100.0)
Platelets: 53 10*3/uL — ABNORMAL LOW (ref 150–440)
RBC: 3.21 MIL/uL — AB (ref 3.80–5.20)
RDW: 24.5 % — ABNORMAL HIGH (ref 11.5–14.5)
WBC: 5 10*3/uL (ref 3.6–11.0)

## 2018-03-16 LAB — BASIC METABOLIC PANEL
Anion gap: 6 (ref 5–15)
BUN: 27 mg/dL — AB (ref 8–23)
CHLORIDE: 109 mmol/L (ref 98–111)
CO2: 24 mmol/L (ref 22–32)
Calcium: 8.7 mg/dL — ABNORMAL LOW (ref 8.9–10.3)
Creatinine, Ser: 1.07 mg/dL — ABNORMAL HIGH (ref 0.44–1.00)
GFR calc Af Amer: 56 mL/min — ABNORMAL LOW (ref >60)
GFR calc non Af Amer: 48 mL/min — ABNORMAL LOW (ref >60)
Glucose, Bld: 127 mg/dL — ABNORMAL HIGH (ref 70–99)
POTASSIUM: 4 mmol/L (ref 3.5–5.1)
SODIUM: 139 mmol/L (ref 135–145)

## 2018-03-16 LAB — HEMOGLOBIN A1C
ESTIMATED AVERAGE GLUCOSE: 100 mg/dL
HEMOGLOBIN A1C: 5.1 % (ref 4.8–5.6)

## 2018-03-16 MED ORDER — PHENYLEPHRINE HCL 0.5 % NA SOLN
1.0000 [drp] | Freq: Once | NASAL | Status: AC
  Administered 2018-03-16: 1 [drp] via NASAL
  Filled 2018-03-16: qty 15

## 2018-03-16 NOTE — ED Notes (Signed)
Pt given warm blanket and more towels. Nose remains bleeding at this time.

## 2018-03-16 NOTE — ED Provider Notes (Signed)
[facility] Emergency Department Provider Note  ____________________________________________  Time seen: Approximately 1:21 PM  I have reviewed the triage vital signs and the nursing notes.   HISTORY  Chief Complaint Epistaxis   HPI Yesenia Hart is a 77 y.o. 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  Patient reports that it started at 9 AM this morning, located in the right nare, constant for several hours.  She tried blowing out her nose but did not apply any pressure.  She denies dizziness, chest pain or shortness of breath.  She reports that while in the waiting room her symptoms resolved. She is not on blood thinners.  Past Medical History:  Diagnosis Date  . Anemia   . Clotting disorder ([place])   . Diabetes mellitus without complication ([place])   . IBS (irritable bowel syndrome)   . Iron deficiency anemia due to chronic blood loss 12/12/2017  . MDS (myelodysplastic syndrome) ([place])   . MDS (myelodysplastic syndrome) ([place])   . Thyroid disease     Patient Active Problem List   Diagnosis Date Noted  . 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 CAD (coronary artery disease), native coronary artery 03/15/2018  . Drug-induced neutropenia ([place]) 01/14/2018  . Type 2 diabetes mellitus without complication, with long-term current use of insulin ([place]) 12/17/2017  . Fever blister 12/17/2017  . Tobacco use disorder, moderate, in sustained remission 12/17/2017  . Thrombocytopenia ([place]) 12/12/2017  . Goals of care, counseling/discussion 12/12/2017  . Other fatigue 12/12/2017  . Asthma 11/30/2017  . Cataract 11/30/2017  . Chronic obstructive lung disease ([place]) 11/30/2017  . 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 Edema 11/30/2017  . Essential hypertension 11/30/2017  . Hyperlipidemia associated with type 2 diabetes mellitus ([place]) 11/30/2017  . Osteoarthritis 11/30/2017  . Stricture  of esophagus 11/30/2017  . 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 APPENDECTOMY    . breast biopsy 14'    . CHOLECYSTECTOMY    . CORONARY ANGIOPLASTY WITH STENT PLACEMENT  2016   in [place]    Prior to Admission medications   Medication Sig Start Date End Date Taking? Authorizing Provider  atorvastatin (LIPITOR) 40 MG tablet Take 1 tablet (40 mg total) by mouth daily. 12/17/17   Glean Hess, MD  azaCITIDine in lactated ringers infusion Inject 100 mg/m2 into the vein daily.    [provider]  furosemide (LASIX) 20 MG tablet Take 1 tablet (20 mg total) by mouth daily. 12/17/17   Glean Hess, MD  glimepiride (AMARYL) 4 MG tablet Take 1 tablet (4 mg total) by mouth daily. 12/17/17   Glean Hess, MD  Lancets [facility] ULTRASOFT) lancets Use as instructed 02/07/18   Glean Hess, MD  levothyroxine (SYNTHROID, LEVOTHROID) 50 MCG tablet Take 1 tablet (50 mcg total) by mouth daily before breakfast. 12/17/17   Glean Hess, MD  NOVOLOG MIX 70/30 FLEXPEN (70-30) 100 UNIT/ML FlexPen Inject 0.1-0.15 mLs (10-15 Units total) into the skin 2 (two) times daily with a meal. Patient taking differently: Inject 10 Units into the skin 2 (two) times daily with a meal.  12/19/17   Glean Hess, MD  nystatin (MYCOSTATIN/NYSTOP) powder nystatin 100,000 unit/gram topical powder   1 app by topical route. 08/09/17   [provider]  ondansetron (ZOFRAN) 8 MG tablet Take 1 tablet (8 mg total) by mouth  2 (two) times daily as needed (Nausea or vomiting). 12/13/17   Earlie Server, MD  ONE TOUCH ULTRA TEST test strip 1 each by Other route 2 (two) times daily. Use as instructed 12/17/17   Glean Hess, MD  pantoprazole (PROTONIX) 40 MG tablet Take 1 tablet (40 mg total) by mouth daily. 12/17/17   Glean Hess, MD  quinapril (ACCUPRIL) 20 MG tablet Take 1 tablet (20 mg total) by mouth daily. 03/15/18    Glean Hess, MD  sulfaSALAzine (AZULFIDINE) 500 MG EC tablet Take 1 tablet (500 mg total) by mouth at bedtime. 12/17/17   Glean Hess, MD  ULTICARE MICRO PEN NEEDLES 32G X 4 MM MISC  12/14/17   [provider]    Allergies Acarbose; Demerol [meperidine hcl]; Meperidine; and Pioglitazone  Family History  Adopted: Yes  Family history unknown: Yes    Social History Social History   Tobacco Use  . Smoking status: Former Smoker    Packs/day: 1.00    Types: Cigarettes    Last attempt to quit: 1999    Years since quitting: 20.6  . Smokeless tobacco: Never Used  Substance Use Topics  . Alcohol use: Not Currently  . Drug use: Never    Review of Systems  Constitutional: Negative for fever. Eyes: Negative for visual changes. ENT: Negative for sore throat. + epistaxis Neck: No neck pain  Cardiovascular: Negative for chest pain. Respiratory: Negative for shortness of breath. Gastrointestinal: Negative for abdominal pain, vomiting or diarrhea. Genitourinary: Negative for dysuria. Musculoskeletal: Negative for back pain. Skin: Negative for rash. Neurological: Negative for headaches, weakness or numbness. Psych: No SI or HI  ____________________________________________   PHYSICAL EXAM:  VITAL SIGNS: ED Triage Vitals  Enc Vitals Group     BP 03/16/18 1114 (!) 116/48     Pulse Rate 03/16/18 1114 89     Resp 03/16/18 1114 18     Temp 03/16/18 1114 98.5 F (36.9 C)     Temp Source 03/16/18 1114 Axillary     SpO2 03/16/18 1114 93 %     Weight 03/16/18 1113 158 lb (71.7 kg)     Height 03/16/18 1113 5' 4"  (1.626 m)     Head Circumference --      Peak Flow --      Pain Score 03/16/18 1113 0     Pain Loc --      Pain Edu? --      Excl. in [place]? --     Constitutional: Alert and oriented. Well appearing and in no apparent distress. HEENT:      Head: Normocephalic and atraumatic.         Eyes: Conjunctivae are normal. Sclera is non-icteric.       Nose:  irritated mucosa with minimal oozing and no obvious abrasion      Mouth/Throat: Mucous membranes are moist.       Neck: Supple with no signs of meningismus. Cardiovascular: Regular rate and rhythm. No murmurs, gallops, or rubs. 2+ symmetrical distal pulses are present in all extremities. No JVD. Respiratory: Normal respiratory effort. Lungs are clear to auscultation bilaterally. No wheezes, crackles, or rhonchi.  Neurologic: Normal speech and language. Face is symmetric. Moving all extremities. No gross focal neurologic deficits are appreciated. Skin: Skin is warm, dry and intact. No rash noted. Psychiatric: Mood and affect are normal. Speech and behavior are normal.  ____________________________________________   LABS (all labs ordered are listed, but only abnormal results are displayed)  Labs  Reviewed  CBC - Abnormal; Notable for the following components:      Result Value   RBC 3.21 (*)    Hemoglobin 9.8 (*)    HCT 30.0 (*)    RDW 24.5 (*)    Platelets 53 (*)    All other components within normal limits  BASIC METABOLIC PANEL - Abnormal; Notable for the following components:   Glucose, Bld 127 (*)    BUN 27 (*)    Creatinine, Ser 1.07 (*)    Calcium 8.7 (*)    GFR calc non Af Amer 48 (*)    GFR calc Af Amer 56 (*)    All other components within normal limits   ____________________________________________  EKG  none  ____________________________________________  RADIOLOGY  none  ____________________________________________   PROCEDURES  Procedure(s) performed: yes  :Procedures   Epistaxis Management Date/Time: 03/16/2018 at 1:23 PM Performed by: Rudene Re Authorized by: Rudene Re Consent: Verbal consent obtained. Written consent not obtained. Risks and benefits: risks, benefits and alternatives were discussed Consent given by: patient Required items: required blood products, implants, devices, and special equipment available Patient  sedated: no Repair method: Afrin applied and pressure for 20 min Post-procedure assessment: bleeding stopped after 15 minutes with pressure Treatment complexity: simple Patient tolerance: Patient tolerated the procedure well with no immediate complications   Critical Care performed:  None ____________________________________________   INITIAL IMPRESSION / ASSESSMENT AND PLAN / ED COURSE  78 y.o. 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  Patient is hemodynamically stable, has mild irritated a right-sided mucosa with slight oozing of blood but no abrasion or active bleeding/blood clots.  Afrin was applied and pressure for 20 minutes with full resolution oozing.  Labs showed improved platelets at 53 and stable anemia. Patient was given nasal clamp and Afrin in case bleeding restarts at home.  Discussed return precautions for bleeding unable to be controlled at home or signs and symptoms of acute blood loss anemia      As part of my medical decision making, I reviewed the following data within the [facility] notes reviewed and incorporated, Labs reviewed , Old chart reviewed, Notes from prior ED visits and [place] Controlled Substance Database    Pertinent labs & imaging results that were available during my care of the patient were reviewed by me and considered in my medical decision making (see chart for details).    ____________________________________________   FINAL CLINICAL IMPRESSION(S) / ED DIAGNOSES  Final diagnoses:  Epistaxis      NEW MEDICATIONS STARTED DURING THIS VISIT:  ED Discharge Orders    None       Note:  This document was prepared using Dragon voice recognition software and may include unintentional dictation errors.    Alfred Levins, [place], MD 03/16/18 [id number]

## 2018-03-16 NOTE — ED Triage Notes (Addendum)
Pt arrives with family for nosebleed. Nosebleed this AM for 2 hours. Denies blood thinner use. Pt is currently bleeding, holding pressure. Nasal clamp applied but wasn't enough pressure. Hasn't tried afrin. Hasn't had nosebleed before. Recent platelet count 41 per family. 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

## 2018-03-16 NOTE — ED Notes (Signed)
Family given multiple wash clothes for pt to hold pressure to nose.

## 2018-03-18 ENCOUNTER — Inpatient Hospital Stay: Payer: Medicare Other

## 2018-03-18 ENCOUNTER — Ambulatory Visit: Payer: Medicare Other

## 2018-03-18 DIAGNOSIS — D696 Thrombocytopenia, unspecified: Secondary | ICD-10-CM | POA: Diagnosis not present

## 2018-03-18 DIAGNOSIS — M79641 Pain in right hand: Secondary | ICD-10-CM | POA: Diagnosis not present

## 2018-03-18 DIAGNOSIS — M79642 Pain in left hand: Secondary | ICD-10-CM | POA: Diagnosis not present

## 2018-03-18 DIAGNOSIS — D46C Myelodysplastic syndrome with isolated del(5q) chromosomal abnormality: Secondary | ICD-10-CM

## 2018-03-18 DIAGNOSIS — Z5111 Encounter for antineoplastic chemotherapy: Secondary | ICD-10-CM | POA: Diagnosis not present

## 2018-03-18 DIAGNOSIS — D701 Agranulocytosis secondary to cancer chemotherapy: Secondary | ICD-10-CM | POA: Diagnosis not present

## 2018-03-18 MED ORDER — ONDANSETRON HCL 4 MG PO TABS
8.0000 mg | ORAL_TABLET | Freq: Once | ORAL | Status: DC
  Filled 2018-03-18: qty 2

## 2018-03-18 MED ORDER — AZACITIDINE CHEMO SQ INJECTION
75.0000 mg/m2 | Freq: Once | INTRAMUSCULAR | Status: AC
  Administered 2018-03-18: 132.5 mg via SUBCUTANEOUS
  Filled 2018-03-18: qty 5.3

## 2018-03-18 NOTE — Progress Notes (Signed)
Patient took Zofran at home prior to visit

## 2018-04-08 ENCOUNTER — Inpatient Hospital Stay (HOSPITAL_BASED_OUTPATIENT_CLINIC_OR_DEPARTMENT_OTHER): Payer: Medicare Other | Admitting: Oncology

## 2018-04-08 ENCOUNTER — Encounter: Payer: Self-pay | Admitting: Oncology

## 2018-04-08 ENCOUNTER — Inpatient Hospital Stay: Payer: Medicare Other

## 2018-04-08 ENCOUNTER — Other Ambulatory Visit: Payer: Self-pay

## 2018-04-08 VITALS — BP 117/56 | HR 65 | Temp 97.0°F | Resp 18 | Wt 155.5 lb

## 2018-04-08 DIAGNOSIS — R5383 Other fatigue: Secondary | ICD-10-CM | POA: Diagnosis not present

## 2018-04-08 DIAGNOSIS — D46C Myelodysplastic syndrome with isolated del(5q) chromosomal abnormality: Secondary | ICD-10-CM | POA: Diagnosis not present

## 2018-04-08 DIAGNOSIS — D701 Agranulocytosis secondary to cancer chemotherapy: Secondary | ICD-10-CM

## 2018-04-08 DIAGNOSIS — M79642 Pain in left hand: Secondary | ICD-10-CM | POA: Diagnosis not present

## 2018-04-08 DIAGNOSIS — Z5111 Encounter for antineoplastic chemotherapy: Secondary | ICD-10-CM | POA: Diagnosis not present

## 2018-04-08 DIAGNOSIS — D696 Thrombocytopenia, unspecified: Secondary | ICD-10-CM | POA: Diagnosis not present

## 2018-04-08 DIAGNOSIS — D6189 Other specified aplastic anemias and other bone marrow failure syndromes: Secondary | ICD-10-CM

## 2018-04-08 DIAGNOSIS — M79641 Pain in right hand: Secondary | ICD-10-CM | POA: Diagnosis not present

## 2018-04-08 DIAGNOSIS — D649 Anemia, unspecified: Secondary | ICD-10-CM

## 2018-04-08 LAB — CBC WITH DIFFERENTIAL/PLATELET
BASOS PCT: 1 %
Basophils Absolute: 0 10*3/uL (ref 0–0.1)
EOS ABS: 0.1 10*3/uL (ref 0–0.7)
EOS PCT: 3 %
HCT: 29.5 % — ABNORMAL LOW (ref 35.0–47.0)
Hemoglobin: 9.6 g/dL — ABNORMAL LOW (ref 12.0–16.0)
Lymphocytes Relative: 42 %
Lymphs Abs: 1 10*3/uL (ref 1.0–3.6)
MCH: 31.3 pg (ref 26.0–34.0)
MCHC: 32.5 g/dL (ref 32.0–36.0)
MCV: 96.1 fL (ref 80.0–100.0)
MONO ABS: 0.2 10*3/uL (ref 0.2–0.9)
Monocytes Relative: 7 %
NEUTROS ABS: 1.1 10*3/uL — AB (ref 1.4–6.5)
Neutrophils Relative %: 47 %
PLATELETS: 124 10*3/uL — AB (ref 150–440)
RBC: 3.07 MIL/uL — ABNORMAL LOW (ref 3.80–5.20)
RDW: 23.4 % — AB (ref 11.5–14.5)
WBC: 2.4 10*3/uL — ABNORMAL LOW (ref 3.6–11.0)

## 2018-04-08 LAB — COMPREHENSIVE METABOLIC PANEL
ALBUMIN: 3.6 g/dL (ref 3.5–5.0)
ALK PHOS: 114 U/L (ref 38–126)
ALT: 17 U/L (ref 0–44)
ANION GAP: 7 (ref 5–15)
AST: 20 U/L (ref 15–41)
BILIRUBIN TOTAL: 0.7 mg/dL (ref 0.3–1.2)
BUN: 19 mg/dL (ref 8–23)
CALCIUM: 8.9 mg/dL (ref 8.9–10.3)
CO2: 24 mmol/L (ref 22–32)
CREATININE: 1 mg/dL (ref 0.44–1.00)
Chloride: 108 mmol/L (ref 98–111)
GFR calc Af Amer: >60 mL/min (ref >60)
GFR calc non Af Amer: 53 mL/min — ABNORMAL LOW (ref >60)
GLUCOSE: 121 mg/dL — AB (ref 70–99)
Potassium: 4.5 mmol/L (ref 3.5–5.1)
Sodium: 139 mmol/L (ref 135–145)
Total Protein: 6.5 g/dL (ref 6.5–8.1)

## 2018-04-08 MED ORDER — ONDANSETRON HCL 4 MG PO TABS: 8.0000 mg | ORAL_TABLET | Freq: Once | ORAL | Status: DC

## 2018-04-08 MED ORDER — AZACITIDINE CHEMO SQ INJECTION
75.0000 mg/m2 | Freq: Once | INTRAMUSCULAR | Status: AC
  Administered 2018-04-08: 132.5 mg via SUBCUTANEOUS
  Filled 2018-04-08: qty 5.3

## 2018-04-08 NOTE — Progress Notes (Signed)
Patient here for follow up. No concerns voiced.  °

## 2018-04-08 NOTE — Progress Notes (Signed)
Hematology/Oncology Follow up note [facility] Telephone:([phone] [phone]   Patient Care Team: Glean Hess, MD as PCP - General (Internal Medicine)  REFERRING PROVIDER: Glean Hess, MD  Previous Oncologist Dr.Srujitha Murkutla REASON FOR VISIT Follow up for treatment of MDS  HISTORY OF PRESENTING ILLNESS:  Yesenia Hart is a  77 y.o.  female who recently moved from [place] to [place].  She used to live at [place] with husband.  Accompanied by daughter, Yesenia Hart who is a respiratory therapist at [employer].  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  Patient presented to emergency room in January 2019 with complaints of intermittent rectal bleeding and increased to have a hemoglobin of 3 and platelet counts was 59,000.  Received 5 units of PRBC and was discharged home with hemoglobin of 10.  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  Cauterization was deferred due to thrombocytopenia. Aspirin was discontinued due to GI bleeding. Peripheral blood flow, SPEP negative, no obvious signs of B12 deficiency.  Ultrasound of the abdomen showed mild fatty liver.  On October 04, 2017, patient underwent bone marrow biopsy. Bone marrow core biopsy, bone marrow aspirate clot, bone marrow aspirate smears showed  single linage dysplasia.  Less than 5% bone marrow blasts and no circulating blast. Iron storage is nearly absent Peripheral blood smear showed moderate and anisopoikilocytosis Marrow aspirate normal in  adenoid maturation Cytogenetics performed at the [facility] in [place] showed 46,xx, t(3;17;16)(p21;q21;q24), add (5) (q11,2), idic(22)(p11,2)[18]/46,xx[2].  20 metaphases, lites were normal and 18 metaphases 5 q. deletion, it has translocation involving  chromosome of 3, 17, 16, and iso-di centric 22.  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  Patient received IV iron infusion on moving to [place].  She has not had any treatment for MDS done due to the move.  Today patient is accompanied by her daughter to the clinic.  She reports feeling tired, no weight loss.  She reports easy bruising.  Intermittent epistaxis which spontaneously resolved after applying pressure denies any hematochezia, hematemesis, hemoptysis.  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 Although patient does have 5q deletion which if isolated, is a good cytogentic group, she carried other cytogentic abnormalities which increases her IPSS-R score.   INTERVAL HISTORY Yesenia Hart is a 77 y.o. female who has above history reviewed by me today presents for assessment prior to MDS treatment with azacitidine. Today she start cycle 5 treatment. She reports overall she tolerates the treatment.  Denies any nausea vomiting fever or chills. During the interval she had 2 ER visits due to near syncopal event and epistaxis.  Near syncopal events was considered to be dehydration. Epistaxis was found to secondary to right side mucosa with slight oozing of blood but no abrasion or active bleeding/blood clots. Afrin was applied and pressure for 20 minutes with full resolution of oozing. Marland Kitchen #Thrombocytopenia, stable.  Denies any easy bruising or active bleeding events except epistaxis episode. #Fatigue chronic stable..    Review of Systems  Constitutional: Positive for malaise/fatigue. Negative for chills, fever and weight loss.  HENT: Negative for congestion, ear discharge, ear pain, nosebleeds, sinus pain and sore throat.   Eyes: Negative for double vision, photophobia, pain, discharge and redness.  Respiratory: Negative for  cough, hemoptysis, sputum production, shortness of breath and  wheezing.   Cardiovascular: Negative for chest pain, palpitations, orthopnea, claudication and leg swelling.  Gastrointestinal: Negative for abdominal pain, blood in stool, constipation, diarrhea, heartburn, melena, nausea and vomiting.  Genitourinary: Negative for dysuria, flank pain, frequency and hematuria.  Musculoskeletal: Negative for back pain, myalgias and neck pain.  Skin: Negative for itching and rash.  Neurological: Negative for dizziness, tingling, tremors, focal weakness, weakness and headaches.  Endo/Heme/Allergies: Negative for environmental allergies. Does not bruise/bleed easily.  Psychiatric/Behavioral: Negative for depression and hallucinations. The patient is not nervous/anxious.     MEDICAL HISTORY:  Past Medical History:  Diagnosis Date  . Anemia   . Clotting disorder ([place])   . Diabetes mellitus without complication ([place])   . IBS (irritable bowel syndrome)   . Iron deficiency anemia due to chronic blood loss 12/12/2017  . MDS (myelodysplastic syndrome) ([place])   . MDS (myelodysplastic syndrome) ([place])   . Thyroid disease     SURGICAL HISTORY: Past Surgical History:  Procedure Laterality Date  . APPENDECTOMY    . breast biopsy 19'    . CHOLECYSTECTOMY    . CORONARY ANGIOPLASTY WITH STENT PLACEMENT  2016   in [place]: Social History   Socioeconomic History  . Marital status: Married    Spouse name: Not on file  . Number of children: Not on file  . Years of education: Not on file  . Highest education level: Not on file  Occupational History  . Occupation: retired  Scientific laboratory technician  . Financial resource strain: Not on file  . Food insecurity:    Worry: Not on file    Inability: Not on file  . Transportation needs:    Medical: Not on file    Non-medical: Not on file  Tobacco Use  . Smoking status: Former Smoker    Packs/day: 1.00    Types: Cigarettes    Last attempt to quit: 1999    Years since quitting: 20.7  . Smokeless tobacco:  Never Used  Substance and Sexual Activity  . Alcohol use: Not Currently  . Drug use: Never  . Sexual activity: Yes  Lifestyle  . Physical activity:    Days per week: Not on file    Minutes per session: Not on file  . Stress: Not on file  Relationships  . Social connections:    Talks on phone: Not on file    Gets together: Not on file    Attends religious service: Not on file    Active member of club or organization: Not on file    Attends meetings of clubs or organizations: Not on file    Relationship status: Not on file  . Intimate partner violence:    Fear of current or ex partner: Not on file    Emotionally abused: Not on file    Physically abused: Not on file    Forced sexual activity: Not on file  Other Topics Concern  . Not on file  Social History Narrative  . Not on file    FAMILY HISTORY: Family History  Adopted: Yes  Family history unknown: Yes    ALLERGIES:  is allergic to acarbose; demerol [meperidine hcl]; meperidine; and pioglitazone.  MEDICATIONS:  Current Outpatient Medications  Medication Sig Dispense Refill  . atorvastatin (LIPITOR) 40 MG tablet Take 1 tablet (40 mg total) by mouth daily. 90 tablet 1  . azaCITIDine in lactated ringers infusion Inject 100 mg/m2 into the  vein daily.    . furosemide (LASIX) 20 MG tablet Take 1 tablet (20 mg total) by mouth daily. 30 tablet 3  . glimepiride (AMARYL) 4 MG tablet Take 1 tablet (4 mg total) by mouth daily. 90 tablet 1  . Lancets (ONETOUCH ULTRASOFT) lancets Use as instructed 100 each 12  . levothyroxine (SYNTHROID, LEVOTHROID) 50 MCG tablet Take 1 tablet (50 mcg total) by mouth daily before breakfast. 90 tablet 1  . NOVOLOG MIX 70/30 FLEXPEN (70-30) 100 UNIT/ML FlexPen Inject 0.1-0.15 mLs (10-15 Units total) into the skin 2 (two) times daily with a meal. (Patient taking differently: Inject 8 Units into the skin 2 (two) times daily with a meal. ) 21 mL 1  . nystatin (MYCOSTATIN/NYSTOP) powder nystatin 100,000  unit/gram topical powder   1 app by topical route.    . ondansetron (ZOFRAN) 8 MG tablet Take 1 tablet (8 mg total) by mouth 2 (two) times daily as needed (Nausea or vomiting). 30 tablet 1  . ONE TOUCH ULTRA TEST test strip 1 each by Other route 2 (two) times daily. Use as instructed 200 each 1  . pantoprazole (PROTONIX) 40 MG tablet Take 1 tablet (40 mg total) by mouth daily. 90 tablet 1  . quinapril (ACCUPRIL) 20 MG tablet Take 1 tablet (20 mg total) by mouth daily. 90 tablet 1  . sulfaSALAzine (AZULFIDINE) 500 MG EC tablet Take 1 tablet (500 mg total) by mouth at bedtime. 90 tablet 1  . ULTICARE MICRO PEN NEEDLES 32G X 4 MM MISC      No current facility-administered medications for this visit.      PHYSICAL EXAMINATION: ECOG PERFORMANCE STATUS: 1 - Symptomatic but completely ambulatory Vitals:   04/08/18 1322  BP: (!) 117/56  Pulse: 65  Resp: 18  Temp: (!) 97 F (36.1 C)   Filed Weights   04/08/18 1322  Weight: 155 lb 8 oz (70.5 kg)    Physical Exam  Constitutional: She is oriented to person, place, and time. She appears well-developed and well-nourished. No distress.  HENT:  Head: Normocephalic and atraumatic.  Right Ear: External ear normal.  Left Ear: External ear normal.  Mouth/Throat: Oropharynx is clear and moist.  Eyes: Pupils are equal, round, and reactive to light. Conjunctivae and EOM are normal. No scleral icterus.  Pale conjunctivae.   Neck: Normal range of motion. Neck supple.  Cardiovascular: Normal rate, regular rhythm and normal heart sounds.  Pulmonary/Chest: Effort normal and breath sounds normal. No respiratory distress. She has no wheezes. She has no rales. She exhibits no tenderness.  Abdominal: Soft. Bowel sounds are normal. She exhibits no distension and no mass. There is no tenderness.  Musculoskeletal: Normal range of motion. She exhibits no edema or deformity.  Lymphadenopathy:    She has no cervical adenopathy.  Neurological: She is alert and  oriented to person, place, and time. No cranial nerve deficit. Coordination normal.  Skin: Skin is warm and dry. No rash noted. No erythema.  Psychiatric: She has a normal mood and affect. Her behavior is normal. Thought content normal.     LABORATORY DATA:  I have reviewed the data as listed Lab Results  Component Value Date   WBC 2.4 (L) 04/08/2018   HGB 9.6 (L) 04/08/2018   HCT 29.5 (L) 04/08/2018   MCV 96.1 04/08/2018   PLT 124 (L) 04/08/2018   Recent Labs    03/12/18 1040 03/13/18 0823 03/16/18 1116 04/08/18 1303  NA 137 140 139 139  K 4.3 4.1  4.0 4.5  CL 106 108 109 108  CO2 _0 GLUCOSE 115* 148* 127* 121*  BUN 24* 23 27* 19  CREATININE 1.08* 1.12* 1.07* 1.00  CALCIUM 8.8* 8.8* 8.7* 8.9  GFRNONAA 48* 46* 48* 53*  GFRAA 55* 53* 56* >60  PROT 6.7 6.3*  --  6.5  ALBUMIN 3.7 3.5  --  3.6  AST 24 24  --  20  ALT 20 17  --  17  ALKPHOS 112 106  --  114  BILITOT 0.4 0.9  --  0.7       ASSESSMENT & PLAN:  1. 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 Thrombocytopenia ([place])   3. Encounter for antineoplastic chemotherapy   4. 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 Other fatigue     # MDS: Status post 4 cycles of azacitidine.  Tolerating well. Labs were reviewed and discussed with patient.  She seems to have a good treatment response with improvement of platelet to 124,000.  Counts are acceptable to proceed with cycle 5 D1-5 azacitidine.   # Thrombocytopenia: Platelet counts improved to 124,000.  Continue monitor.   # Anemia, stable at baseline..  # Fatigue: chronic, stable monitor. #Neutropenia with ANC of 1.1.  Advised patient to have follow-up weekly CBC checked for further monitoring.  I would not hold azacitidine treatment today.  All questions were answered. The patient knows to call the clinic with any problems questions or concerns.  Total face to face encounter time for this patient visit was 25 min. >50% of the  time was  spent in counseling and coordination of care.  Return of visit: 4 weeks for assessment prior to next cycle of azacitidine Total face to face encounter time for this patient visit was 18mn. >50% of the time was  spent in counseling and coordination of care.  ZEarlie Server MD, PhD Hematology Oncology [facility] AEdward White HospitalPager- [phone]/30/2019

## 2018-04-09 ENCOUNTER — Other Ambulatory Visit: Payer: Self-pay | Admitting: Oncology

## 2018-04-09 ENCOUNTER — Inpatient Hospital Stay: Payer: Medicare Other | Attending: Oncology

## 2018-04-09 DIAGNOSIS — D696 Thrombocytopenia, unspecified: Secondary | ICD-10-CM | POA: Insufficient documentation

## 2018-04-09 DIAGNOSIS — Z79899 Other long term (current) drug therapy: Secondary | ICD-10-CM | POA: Diagnosis not present

## 2018-04-09 DIAGNOSIS — E119 Type 2 diabetes mellitus without complications: Secondary | ICD-10-CM | POA: Insufficient documentation

## 2018-04-09 DIAGNOSIS — Z5111 Encounter for antineoplastic chemotherapy: Secondary | ICD-10-CM | POA: Insufficient documentation

## 2018-04-09 DIAGNOSIS — Z794 Long term (current) use of insulin: Secondary | ICD-10-CM | POA: Insufficient documentation

## 2018-04-09 DIAGNOSIS — D46C Myelodysplastic syndrome with isolated del(5q) chromosomal abnormality: Secondary | ICD-10-CM | POA: Diagnosis not present

## 2018-04-09 DIAGNOSIS — R5383 Other fatigue: Secondary | ICD-10-CM | POA: Diagnosis not present

## 2018-04-09 DIAGNOSIS — Z8719 Personal history of other diseases of the digestive system: Secondary | ICD-10-CM | POA: Insufficient documentation

## 2018-04-09 DIAGNOSIS — D709 Neutropenia, unspecified: Secondary | ICD-10-CM | POA: Diagnosis not present

## 2018-04-09 DIAGNOSIS — Z87891 Personal history of nicotine dependence: Secondary | ICD-10-CM | POA: Diagnosis not present

## 2018-04-09 DIAGNOSIS — K760 Fatty (change of) liver, not elsewhere classified: Secondary | ICD-10-CM | POA: Insufficient documentation

## 2018-04-09 DIAGNOSIS — K589 Irritable bowel syndrome without diarrhea: Secondary | ICD-10-CM | POA: Diagnosis not present

## 2018-04-09 DIAGNOSIS — R5381 Other malaise: Secondary | ICD-10-CM | POA: Insufficient documentation

## 2018-04-09 MED ORDER — AZACITIDINE CHEMO SQ INJECTION
75.0000 mg/m2 | Freq: Once | INTRAMUSCULAR | Status: AC
  Administered 2018-04-09: 132.5 mg via SUBCUTANEOUS
  Filled 2018-04-09: qty 5.3

## 2018-04-09 NOTE — Progress Notes (Signed)
Patient states she took Zofran at home prior to visit

## 2018-04-10 ENCOUNTER — Inpatient Hospital Stay: Payer: Medicare Other

## 2018-04-10 VITALS — BP 102/55 | HR 84 | Temp 97.5°F | Resp 18

## 2018-04-10 DIAGNOSIS — D709 Neutropenia, unspecified: Secondary | ICD-10-CM | POA: Diagnosis not present

## 2018-04-10 DIAGNOSIS — D46C Myelodysplastic syndrome with isolated del(5q) chromosomal abnormality: Secondary | ICD-10-CM

## 2018-04-10 DIAGNOSIS — Z5111 Encounter for antineoplastic chemotherapy: Secondary | ICD-10-CM | POA: Diagnosis not present

## 2018-04-10 DIAGNOSIS — R5383 Other fatigue: Secondary | ICD-10-CM | POA: Diagnosis not present

## 2018-04-10 DIAGNOSIS — D696 Thrombocytopenia, unspecified: Secondary | ICD-10-CM | POA: Diagnosis not present

## 2018-04-10 DIAGNOSIS — R5381 Other malaise: Secondary | ICD-10-CM | POA: Diagnosis not present

## 2018-04-10 MED ORDER — AZACITIDINE CHEMO SQ INJECTION
75.0000 mg/m2 | Freq: Once | INTRAMUSCULAR | Status: AC
  Administered 2018-04-10: 132.5 mg via SUBCUTANEOUS
  Filled 2018-04-10: qty 5.3

## 2018-04-10 NOTE — Progress Notes (Signed)
Patient states she took the Zofran just now.Marland Kitchen

## 2018-04-11 ENCOUNTER — Inpatient Hospital Stay: Payer: Medicare Other

## 2018-04-11 VITALS — BP 110/62 | HR 69 | Temp 97.8°F | Resp 20

## 2018-04-11 DIAGNOSIS — D696 Thrombocytopenia, unspecified: Secondary | ICD-10-CM | POA: Diagnosis not present

## 2018-04-11 DIAGNOSIS — Z5111 Encounter for antineoplastic chemotherapy: Secondary | ICD-10-CM | POA: Diagnosis not present

## 2018-04-11 DIAGNOSIS — D46C Myelodysplastic syndrome with isolated del(5q) chromosomal abnormality: Secondary | ICD-10-CM | POA: Diagnosis not present

## 2018-04-11 DIAGNOSIS — D709 Neutropenia, unspecified: Secondary | ICD-10-CM | POA: Diagnosis not present

## 2018-04-11 DIAGNOSIS — R5381 Other malaise: Secondary | ICD-10-CM | POA: Diagnosis not present

## 2018-04-11 DIAGNOSIS — R5383 Other fatigue: Secondary | ICD-10-CM | POA: Diagnosis not present

## 2018-04-11 MED ORDER — ONDANSETRON HCL 4 MG PO TABS
8.0000 mg | ORAL_TABLET | Freq: Once | ORAL | Status: DC
  Filled 2018-04-11: qty 2

## 2018-04-11 MED ORDER — AZACITIDINE CHEMO SQ INJECTION
75.0000 mg/m2 | Freq: Once | INTRAMUSCULAR | Status: AC
  Administered 2018-04-11: 132.5 mg via SUBCUTANEOUS
  Filled 2018-04-11: qty 5.3

## 2018-04-11 NOTE — Addendum Note (Signed)
Addended by: Earlie Server on: 04/11/2018 02:21 PM   Modules accepted: Orders

## 2018-04-12 ENCOUNTER — Inpatient Hospital Stay: Payer: Medicare Other

## 2018-04-12 VITALS — BP 121/66 | HR 76 | Temp 97.1°F | Resp 18

## 2018-04-12 DIAGNOSIS — Z5111 Encounter for antineoplastic chemotherapy: Secondary | ICD-10-CM | POA: Diagnosis not present

## 2018-04-12 DIAGNOSIS — R5381 Other malaise: Secondary | ICD-10-CM | POA: Diagnosis not present

## 2018-04-12 DIAGNOSIS — D46C Myelodysplastic syndrome with isolated del(5q) chromosomal abnormality: Secondary | ICD-10-CM | POA: Diagnosis not present

## 2018-04-12 DIAGNOSIS — D709 Neutropenia, unspecified: Secondary | ICD-10-CM | POA: Diagnosis not present

## 2018-04-12 DIAGNOSIS — R5383 Other fatigue: Secondary | ICD-10-CM | POA: Diagnosis not present

## 2018-04-12 DIAGNOSIS — D696 Thrombocytopenia, unspecified: Secondary | ICD-10-CM | POA: Diagnosis not present

## 2018-04-12 MED ORDER — AZACITIDINE CHEMO SQ INJECTION
75.0000 mg/m2 | Freq: Once | INTRAMUSCULAR | Status: AC
  Administered 2018-04-12: 132.5 mg via SUBCUTANEOUS
  Filled 2018-04-12: qty 5.3

## 2018-04-12 MED ORDER — ONDANSETRON HCL 4 MG PO TABS: 8.0000 mg | ORAL_TABLET | Freq: Once | ORAL | Status: DC

## 2018-04-17 ENCOUNTER — Inpatient Hospital Stay: Payer: Medicare Other

## 2018-04-19 ENCOUNTER — Inpatient Hospital Stay: Payer: Medicare Other

## 2018-04-19 DIAGNOSIS — R5381 Other malaise: Secondary | ICD-10-CM | POA: Diagnosis not present

## 2018-04-19 DIAGNOSIS — D46C Myelodysplastic syndrome with isolated del(5q) chromosomal abnormality: Secondary | ICD-10-CM | POA: Diagnosis not present

## 2018-04-19 DIAGNOSIS — R5383 Other fatigue: Secondary | ICD-10-CM | POA: Diagnosis not present

## 2018-04-19 DIAGNOSIS — Z5111 Encounter for antineoplastic chemotherapy: Secondary | ICD-10-CM | POA: Diagnosis not present

## 2018-04-19 DIAGNOSIS — D709 Neutropenia, unspecified: Secondary | ICD-10-CM | POA: Diagnosis not present

## 2018-04-19 DIAGNOSIS — D696 Thrombocytopenia, unspecified: Secondary | ICD-10-CM | POA: Diagnosis not present

## 2018-04-19 LAB — CBC WITH DIFFERENTIAL/PLATELET
ABS IMMATURE GRANULOCYTES: 0.02 10*3/uL (ref 0.00–0.07)
BASOS ABS: 0 10*3/uL (ref 0.0–0.1)
Basophils Relative: 1 %
EOS ABS: 0.2 10*3/uL (ref 0.0–0.5)
Eosinophils Relative: 4 %
HCT: 31.8 % — ABNORMAL LOW (ref 36.0–46.0)
Hemoglobin: 10 g/dL — ABNORMAL LOW (ref 12.0–15.0)
IMMATURE GRANULOCYTES: 1 %
LYMPHS ABS: 1.3 10*3/uL (ref 0.7–4.0)
Lymphocytes Relative: 35 %
MCH: 30.8 pg (ref 26.0–34.0)
MCHC: 31.4 g/dL (ref 30.0–36.0)
MCV: 97.8 fL (ref 80.0–100.0)
MONOS PCT: 8 %
Monocytes Absolute: 0.3 10*3/uL (ref 0.1–1.0)
NEUTROS ABS: 1.9 10*3/uL (ref 1.7–7.7)
NEUTROS PCT: 51 %
NRBC: 0 % (ref 0.0–0.2)
PLATELETS: 88 10*3/uL — AB (ref 150–400)
RBC: 3.25 MIL/uL — ABNORMAL LOW (ref 3.87–5.11)
RDW: 20.3 % — AB (ref 11.5–15.5)
WBC: 3.6 10*3/uL — ABNORMAL LOW (ref 4.0–10.5)

## 2018-04-19 LAB — COMPREHENSIVE METABOLIC PANEL
ALBUMIN: 3.6 g/dL (ref 3.5–5.0)
ALK PHOS: 108 U/L (ref 38–126)
ALT: 15 U/L (ref 0–44)
AST: 21 U/L (ref 15–41)
Anion gap: 7 (ref 5–15)
BUN: 19 mg/dL (ref 8–23)
CALCIUM: 8.9 mg/dL (ref 8.9–10.3)
CHLORIDE: 109 mmol/L (ref 98–111)
CO2: 24 mmol/L (ref 22–32)
Creatinine, Ser: 1.04 mg/dL — ABNORMAL HIGH (ref 0.44–1.00)
GFR calc non Af Amer: 50 mL/min — ABNORMAL LOW (ref >60)
GFR, EST AFRICAN AMERICAN: 58 mL/min — AB (ref >60)
GLUCOSE: 102 mg/dL — AB (ref 70–99)
POTASSIUM: 4.2 mmol/L (ref 3.5–5.1)
Sodium: 140 mmol/L (ref 135–145)
Total Bilirubin: 0.6 mg/dL (ref 0.3–1.2)
Total Protein: 6.4 g/dL — ABNORMAL LOW (ref 6.5–8.1)

## 2018-04-19 LAB — SAMPLE TO BLOOD BANK

## 2018-04-26 ENCOUNTER — Inpatient Hospital Stay: Payer: Medicare Other

## 2018-04-26 DIAGNOSIS — D46C Myelodysplastic syndrome with isolated del(5q) chromosomal abnormality: Secondary | ICD-10-CM

## 2018-04-26 DIAGNOSIS — D696 Thrombocytopenia, unspecified: Secondary | ICD-10-CM | POA: Diagnosis not present

## 2018-04-26 DIAGNOSIS — D709 Neutropenia, unspecified: Secondary | ICD-10-CM | POA: Diagnosis not present

## 2018-04-26 DIAGNOSIS — R5381 Other malaise: Secondary | ICD-10-CM | POA: Diagnosis not present

## 2018-04-26 DIAGNOSIS — R5383 Other fatigue: Secondary | ICD-10-CM | POA: Diagnosis not present

## 2018-04-26 DIAGNOSIS — Z5111 Encounter for antineoplastic chemotherapy: Secondary | ICD-10-CM | POA: Diagnosis not present

## 2018-04-26 LAB — CBC WITH DIFFERENTIAL/PLATELET
ABS IMMATURE GRANULOCYTES: 0.01 10*3/uL (ref 0.00–0.07)
Basophils Absolute: 0 10*3/uL (ref 0.0–0.1)
Basophils Relative: 0 %
EOS ABS: 0.2 10*3/uL (ref 0.0–0.5)
Eosinophils Relative: 5 %
HEMATOCRIT: 31.6 % — AB (ref 36.0–46.0)
Hemoglobin: 10 g/dL — ABNORMAL LOW (ref 12.0–15.0)
Immature Granulocytes: 0 %
Lymphocytes Relative: 31 %
Lymphs Abs: 1.1 10*3/uL (ref 0.7–4.0)
MCH: 31 pg (ref 26.0–34.0)
MCHC: 31.6 g/dL (ref 30.0–36.0)
MCV: 97.8 fL (ref 80.0–100.0)
MONO ABS: 0.2 10*3/uL (ref 0.1–1.0)
MONOS PCT: 6 %
Neutro Abs: 2 10*3/uL (ref 1.7–7.7)
Neutrophils Relative %: 58 %
Platelets: 71 10*3/uL — ABNORMAL LOW (ref 150–400)
RBC: 3.23 MIL/uL — ABNORMAL LOW (ref 3.87–5.11)
RDW: 20.4 % — ABNORMAL HIGH (ref 11.5–15.5)
WBC: 3.5 10*3/uL — ABNORMAL LOW (ref 4.0–10.5)
nRBC: 0 % (ref 0.0–0.2)

## 2018-04-26 LAB — COMPREHENSIVE METABOLIC PANEL
ALK PHOS: 107 U/L (ref 38–126)
ALT: 16 U/L (ref 0–44)
AST: 22 U/L (ref 15–41)
Albumin: 3.8 g/dL (ref 3.5–5.0)
Anion gap: 9 (ref 5–15)
BUN: 21 mg/dL (ref 8–23)
CALCIUM: 8.9 mg/dL (ref 8.9–10.3)
CO2: 26 mmol/L (ref 22–32)
Chloride: 106 mmol/L (ref 98–111)
Creatinine, Ser: 1.07 mg/dL — ABNORMAL HIGH (ref 0.44–1.00)
GFR calc Af Amer: 56 mL/min — ABNORMAL LOW (ref >60)
GFR calc non Af Amer: 48 mL/min — ABNORMAL LOW (ref >60)
Glucose, Bld: 138 mg/dL — ABNORMAL HIGH (ref 70–99)
Potassium: 4 mmol/L (ref 3.5–5.1)
Sodium: 141 mmol/L (ref 135–145)
TOTAL PROTEIN: 6.6 g/dL (ref 6.5–8.1)
Total Bilirubin: 0.6 mg/dL (ref 0.3–1.2)

## 2018-04-26 LAB — SAMPLE TO BLOOD BANK

## 2018-05-03 ENCOUNTER — Other Ambulatory Visit: Payer: Self-pay

## 2018-05-03 ENCOUNTER — Inpatient Hospital Stay: Payer: Medicare Other

## 2018-05-03 DIAGNOSIS — D709 Neutropenia, unspecified: Secondary | ICD-10-CM | POA: Diagnosis not present

## 2018-05-03 DIAGNOSIS — R5381 Other malaise: Secondary | ICD-10-CM | POA: Diagnosis not present

## 2018-05-03 DIAGNOSIS — Z5111 Encounter for antineoplastic chemotherapy: Secondary | ICD-10-CM | POA: Diagnosis not present

## 2018-05-03 DIAGNOSIS — D46C Myelodysplastic syndrome with isolated del(5q) chromosomal abnormality: Secondary | ICD-10-CM | POA: Diagnosis not present

## 2018-05-03 DIAGNOSIS — D696 Thrombocytopenia, unspecified: Secondary | ICD-10-CM | POA: Diagnosis not present

## 2018-05-03 DIAGNOSIS — R5383 Other fatigue: Secondary | ICD-10-CM | POA: Diagnosis not present

## 2018-05-03 LAB — CBC WITH DIFFERENTIAL/PLATELET
ABS IMMATURE GRANULOCYTES: 0 10*3/uL (ref 0.00–0.07)
BASOS PCT: 2 %
Basophils Absolute: 0 10*3/uL (ref 0.0–0.1)
Eosinophils Absolute: 0.1 10*3/uL (ref 0.0–0.5)
Eosinophils Relative: 4 %
HEMATOCRIT: 31.7 % — AB (ref 36.0–46.0)
HEMOGLOBIN: 9.8 g/dL — AB (ref 12.0–15.0)
IMMATURE GRANULOCYTES: 0 %
LYMPHS ABS: 0.8 10*3/uL (ref 0.7–4.0)
Lymphocytes Relative: 36 %
MCH: 30.3 pg (ref 26.0–34.0)
MCHC: 30.9 g/dL (ref 30.0–36.0)
MCV: 98.1 fL (ref 80.0–100.0)
MONO ABS: 0.1 10*3/uL (ref 0.1–1.0)
MONOS PCT: 3 %
NEUTROS ABS: 1.3 10*3/uL — AB (ref 1.7–7.7)
Neutrophils Relative %: 55 %
Platelets: 106 10*3/uL — ABNORMAL LOW (ref 150–400)
RBC: 3.23 MIL/uL — ABNORMAL LOW (ref 3.87–5.11)
RDW: 20.3 % — AB (ref 11.5–15.5)
WBC: 2.3 10*3/uL — ABNORMAL LOW (ref 4.0–10.5)
nRBC: 0 % (ref 0.0–0.2)

## 2018-05-03 LAB — COMPREHENSIVE METABOLIC PANEL
ALBUMIN: 3.6 g/dL (ref 3.5–5.0)
ALK PHOS: 106 U/L (ref 38–126)
ALT: 14 U/L (ref 0–44)
AST: 22 U/L (ref 15–41)
Anion gap: 5 (ref 5–15)
BUN: 19 mg/dL (ref 8–23)
CO2: 24 mmol/L (ref 22–32)
Calcium: 8.9 mg/dL (ref 8.9–10.3)
Chloride: 109 mmol/L (ref 98–111)
Creatinine, Ser: 1.01 mg/dL — ABNORMAL HIGH (ref 0.44–1.00)
GFR calc Af Amer: >60 mL/min (ref >60)
GFR calc non Af Amer: 52 mL/min — ABNORMAL LOW (ref >60)
GLUCOSE: 217 mg/dL — AB (ref 70–99)
Potassium: 4.1 mmol/L (ref 3.5–5.1)
Sodium: 138 mmol/L (ref 135–145)
TOTAL PROTEIN: 6.4 g/dL — AB (ref 6.5–8.1)
Total Bilirubin: 0.7 mg/dL (ref 0.3–1.2)

## 2018-05-03 LAB — SAMPLE TO BLOOD BANK

## 2018-05-06 ENCOUNTER — Inpatient Hospital Stay: Payer: Medicare Other

## 2018-05-06 ENCOUNTER — Other Ambulatory Visit: Payer: Self-pay

## 2018-05-06 ENCOUNTER — Encounter: Payer: Self-pay | Admitting: Oncology

## 2018-05-06 ENCOUNTER — Inpatient Hospital Stay (HOSPITAL_BASED_OUTPATIENT_CLINIC_OR_DEPARTMENT_OTHER): Payer: Medicare Other | Admitting: Oncology

## 2018-05-06 VITALS — BP 135/60 | HR 60 | Temp 96.9°F | Wt 157.1 lb

## 2018-05-06 DIAGNOSIS — E119 Type 2 diabetes mellitus without complications: Secondary | ICD-10-CM

## 2018-05-06 DIAGNOSIS — Z5111 Encounter for antineoplastic chemotherapy: Secondary | ICD-10-CM | POA: Diagnosis not present

## 2018-05-06 DIAGNOSIS — Z8719 Personal history of other diseases of the digestive system: Secondary | ICD-10-CM | POA: Diagnosis not present

## 2018-05-06 DIAGNOSIS — Z794 Long term (current) use of insulin: Secondary | ICD-10-CM

## 2018-05-06 DIAGNOSIS — Z79899 Other long term (current) drug therapy: Secondary | ICD-10-CM

## 2018-05-06 DIAGNOSIS — D46C Myelodysplastic syndrome with isolated del(5q) chromosomal abnormality: Secondary | ICD-10-CM

## 2018-05-06 DIAGNOSIS — R5381 Other malaise: Secondary | ICD-10-CM

## 2018-05-06 DIAGNOSIS — R5383 Other fatigue: Secondary | ICD-10-CM

## 2018-05-06 DIAGNOSIS — K760 Fatty (change of) liver, not elsewhere classified: Secondary | ICD-10-CM | POA: Diagnosis not present

## 2018-05-06 DIAGNOSIS — K589 Irritable bowel syndrome without diarrhea: Secondary | ICD-10-CM

## 2018-05-06 DIAGNOSIS — D6189 Other specified aplastic anemias and other bone marrow failure syndromes: Secondary | ICD-10-CM

## 2018-05-06 DIAGNOSIS — D709 Neutropenia, unspecified: Secondary | ICD-10-CM

## 2018-05-06 DIAGNOSIS — Z87891 Personal history of nicotine dependence: Secondary | ICD-10-CM

## 2018-05-06 DIAGNOSIS — D696 Thrombocytopenia, unspecified: Secondary | ICD-10-CM

## 2018-05-06 LAB — CBC WITH DIFFERENTIAL/PLATELET
ABS IMMATURE GRANULOCYTES: 0.01 10*3/uL (ref 0.00–0.07)
BASOS ABS: 0.1 10*3/uL (ref 0.0–0.1)
BASOS PCT: 2 %
Eosinophils Absolute: 0.1 10*3/uL (ref 0.0–0.5)
Eosinophils Relative: 2 %
HCT: 31.9 % — ABNORMAL LOW (ref 36.0–46.0)
Hemoglobin: 10 g/dL — ABNORMAL LOW (ref 12.0–15.0)
IMMATURE GRANULOCYTES: 0 %
Lymphocytes Relative: 40 %
Lymphs Abs: 1 10*3/uL (ref 0.7–4.0)
MCH: 30.7 pg (ref 26.0–34.0)
MCHC: 31.3 g/dL (ref 30.0–36.0)
MCV: 97.9 fL (ref 80.0–100.0)
MONOS PCT: 5 %
Monocytes Absolute: 0.1 10*3/uL (ref 0.1–1.0)
NEUTROS ABS: 1.3 10*3/uL — AB (ref 1.7–7.7)
NEUTROS PCT: 51 %
PLATELETS: 139 10*3/uL — AB (ref 150–400)
RBC: 3.26 MIL/uL — AB (ref 3.87–5.11)
RDW: 19.9 % — ABNORMAL HIGH (ref 11.5–15.5)
WBC: 2.6 10*3/uL — ABNORMAL LOW (ref 4.0–10.5)
nRBC: 0 % (ref 0.0–0.2)

## 2018-05-06 LAB — COMPREHENSIVE METABOLIC PANEL
ALBUMIN: 3.7 g/dL (ref 3.5–5.0)
ALT: 15 U/L (ref 0–44)
ANION GAP: 6 (ref 5–15)
AST: 20 U/L (ref 15–41)
Alkaline Phosphatase: 112 U/L (ref 38–126)
BUN: 21 mg/dL (ref 8–23)
CO2: 24 mmol/L (ref 22–32)
Calcium: 8.8 mg/dL — ABNORMAL LOW (ref 8.9–10.3)
Chloride: 108 mmol/L (ref 98–111)
Creatinine, Ser: 1.01 mg/dL — ABNORMAL HIGH (ref 0.44–1.00)
GFR calc Af Amer: 60 mL/min — ABNORMAL LOW (ref >60)
GFR calc non Af Amer: 52 mL/min — ABNORMAL LOW (ref >60)
GLUCOSE: 151 mg/dL — AB (ref 70–99)
POTASSIUM: 4.1 mmol/L (ref 3.5–5.1)
SODIUM: 138 mmol/L (ref 135–145)
TOTAL PROTEIN: 6.5 g/dL (ref 6.5–8.1)
Total Bilirubin: 0.4 mg/dL (ref 0.3–1.2)

## 2018-05-06 MED ORDER — ONDANSETRON HCL 4 MG PO TABS: 8.0000 mg | ORAL_TABLET | Freq: Once | ORAL | Status: DC

## 2018-05-06 MED ORDER — AZACITIDINE CHEMO SQ INJECTION
75.0000 mg/m2 | Freq: Once | INTRAMUSCULAR | Status: AC
  Administered 2018-05-06: 132.5 mg via SUBCUTANEOUS
  Filled 2018-05-06: qty 5.3

## 2018-05-06 NOTE — Progress Notes (Signed)
Patient here today for follow up.  Patient states no new concerns today  

## 2018-05-07 ENCOUNTER — Inpatient Hospital Stay: Payer: Medicare Other

## 2018-05-07 VITALS — BP 118/65 | HR 63 | Temp 97.0°F | Resp 20

## 2018-05-07 DIAGNOSIS — D46C Myelodysplastic syndrome with isolated del(5q) chromosomal abnormality: Secondary | ICD-10-CM | POA: Diagnosis not present

## 2018-05-07 DIAGNOSIS — R5383 Other fatigue: Secondary | ICD-10-CM | POA: Diagnosis not present

## 2018-05-07 DIAGNOSIS — Z5111 Encounter for antineoplastic chemotherapy: Secondary | ICD-10-CM | POA: Diagnosis not present

## 2018-05-07 DIAGNOSIS — R5381 Other malaise: Secondary | ICD-10-CM | POA: Diagnosis not present

## 2018-05-07 DIAGNOSIS — D709 Neutropenia, unspecified: Secondary | ICD-10-CM | POA: Diagnosis not present

## 2018-05-07 DIAGNOSIS — D696 Thrombocytopenia, unspecified: Secondary | ICD-10-CM | POA: Diagnosis not present

## 2018-05-07 MED ORDER — ONDANSETRON HCL 4 MG PO TABS: 8.0000 mg | ORAL_TABLET | Freq: Once | ORAL | Status: DC

## 2018-05-07 MED ORDER — AZACITIDINE CHEMO SQ INJECTION
75.0000 mg/m2 | Freq: Once | INTRAMUSCULAR | Status: AC
  Administered 2018-05-07: 132.5 mg via SUBCUTANEOUS
  Filled 2018-05-07: qty 5.3

## 2018-05-07 NOTE — Progress Notes (Signed)
Hematology/Oncology Follow up note [name] [phone] Fax:([phone]   Patient Care Team: Glean Hess, MD as PCP - General (Internal Medicine)  REFERRING PROVIDER: Glean Hess, MD  Previous Oncologist Dr.Srujitha Murkutla REASON FOR VISIT Follow up for treatment of MDS  HISTORY OF PRESENTING ILLNESS:  Yesenia Hart is a  77 y.o.  female who recently moved from [place] to [place].  She used to live at [place] with husband.  Accompanied by daughter, Yesenia Hart who is a respiratory therapist at [employer].  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  Patient presented to emergency room in January 2019 with complaints of intermittent rectal bleeding and increased to have a hemoglobin of 3 and platelet counts was 59,000.  Received 5 units of PRBC and was discharged home with hemoglobin of 10.  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  Cauterization was deferred due to thrombocytopenia. Aspirin was discontinued due to GI bleeding. Peripheral blood flow, SPEP negative, no obvious signs of B12 deficiency.  Ultrasound of the abdomen showed mild fatty liver.  On October 04, 2017, patient underwent bone marrow biopsy. Bone marrow core biopsy, bone marrow aspirate clot, bone marrow aspirate smears showed  single linage dysplasia.  Less than 5% bone marrow blasts and no circulating blast. 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  20 metaphases, lites were normal and 18 metaphases 5 q. deletion, it has translocation involving  chromosome of 3, 17, 16, and iso-di centric 22.  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  Patient received IV iron infusion on moving to [place].  She has not had any treatment for MDS done due to the move.  Today patient is accompanied by her daughter to the clinic.  She reports feeling tired, no weight loss.  She reports easy bruising.  Intermittent epistaxis which spontaneously resolved after applying pressure denies any hematochezia, hematemesis, hemoptysis.  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 Although patient does have 5q deletion which if isolated, is a good cytogentic group, she carried other cytogentic abnormalities which increases her IPSS-R score.   INTERVAL HISTORY Yesenia Hart is a 77 y.o. female who has above history reviewed by me today presents for assessment prior to MDS treatment with azacitidine.  She has tolerated 5 cycles of azacitidine treatment.  Clinically responding to the treatment very well. Denies hematochezia, hematuria, hematemesis, epistaxis, black tarry stool or easy bruising.  Chronic fatigue level is at baseline. Thrombocytopenia, stable. Appetite is good.  Review of Systems  Constitutional: Positive for malaise/fatigue. Negative for chills, fever and weight loss.  HENT: Negative for congestion, ear discharge, ear pain, nosebleeds, sinus pain and sore throat.   Eyes: Negative for double vision, photophobia, pain, discharge and redness.  Respiratory: Negative for cough, hemoptysis, sputum production, shortness of breath and wheezing.   Cardiovascular: Negative for chest pain, palpitations, orthopnea, claudication and leg swelling.  Gastrointestinal: Negative for abdominal pain, blood in stool, constipation, diarrhea, heartburn, melena, nausea and vomiting.  Genitourinary: Negative for dysuria, flank pain, frequency and hematuria.  Musculoskeletal:  Negative for back pain, myalgias and neck pain.  Skin: Negative for itching and rash.  Neurological: Negative for dizziness, tingling, tremors, focal weakness, weakness and headaches.  Endo/Heme/Allergies: Negative for environmental allergies. Does not bruise/bleed easily.  Psychiatric/Behavioral: Negative for depression and hallucinations. The patient is not nervous/anxious.     MEDICAL HISTORY:  Past Medical History:  Diagnosis Date  . Anemia   . Clotting disorder ([place])   . Diabetes mellitus without complication ([place])   . IBS (irritable bowel syndrome)   . Iron deficiency anemia due to chronic blood loss 12/12/2017  . MDS (myelodysplastic syndrome) ([place])   . MDS (myelodysplastic syndrome) ([place])   . Thyroid disease     SURGICAL HISTORY: Past Surgical History:  Procedure Laterality Date  . APPENDECTOMY    . breast biopsy 53'    . CHOLECYSTECTOMY    . CORONARY ANGIOPLASTY WITH STENT PLACEMENT  2016   in [place]: Social History   Socioeconomic History  . Marital status: Married    Spouse name: Not on file  . Number of children: Not on file  . Years of education: Not on file  . Highest education level: Not on file  Occupational History  . Occupation: retired  Scientific laboratory technician  . Financial resource strain: Not on file  . Food insecurity:    Worry: Not on file    Inability: Not on file  . Transportation needs:    Medical: Not on file    Non-medical: Not on file  Tobacco Use  . Smoking status: Former Smoker    Packs/day: 1.00    Types: Cigarettes    Last attempt to quit: 1999    Years since quitting: 20.8  . Smokeless tobacco: Never Used  Substance and Sexual Activity  . Alcohol use: Not Currently  . Drug use: Never  . Sexual activity: Yes  Lifestyle  . Physical activity:    Days per week: Not on file    Minutes per session: Not on file  . Stress: Not on file  Relationships  . Social connections:    Talks on phone: Not on file    Gets  together: Not on file    Attends religious service: Not on file    Active member of club or organization: Not on file    Attends meetings of clubs or organizations: Not on file    Relationship status: Not on file  . Intimate partner violence:    Fear of current or ex partner: Not on file    Emotionally abused: Not on file    Physically abused: Not on file    Forced sexual activity: Not on file  Other Topics Concern  . Not on file  Social History Narrative  . Not on file    FAMILY HISTORY: Family History  Adopted: Yes  Family history unknown: Yes    ALLERGIES:  is allergic to acarbose; demerol [meperidine hcl]; meperidine; and pioglitazone.  MEDICATIONS:  Current Outpatient Medications  Medication Sig Dispense Refill  . atorvastatin (LIPITOR) 40 MG tablet Take 1 tablet (40 mg total) by mouth daily. 90 tablet 1  . azaCITIDine in lactated ringers infusion Inject 100 mg/m2 into the vein daily.    . furosemide (LASIX) 20 MG tablet Take 1 tablet (20 mg total) by mouth daily. 30 tablet 3  . glimepiride (AMARYL) 4 MG tablet Take 1 tablet (4 mg total) by mouth daily. 90 tablet 1  . Lancets (ONETOUCH ULTRASOFT) lancets Use as instructed 100 each 12  . levothyroxine (SYNTHROID, LEVOTHROID) 50  MCG tablet Take 1 tablet (50 mcg total) by mouth daily before breakfast. 90 tablet 1  . NOVOLOG MIX 70/30 FLEXPEN (70-30) 100 UNIT/ML FlexPen Inject 0.1-0.15 mLs (10-15 Units total) into the skin 2 (two) times daily with a meal. (Patient taking differently: Inject 8 Units into the skin 2 (two) times daily with a meal. ) 21 mL 1  . nystatin (MYCOSTATIN/NYSTOP) powder nystatin 100,000 unit/gram topical powder   1 app by topical route.    . ondansetron (ZOFRAN) 8 MG tablet Take 1 tablet (8 mg total) by mouth 2 (two) times daily as needed (Nausea or vomiting). 30 tablet 1  . ONE TOUCH ULTRA TEST test strip 1 each by Other route 2 (two) times daily. Use as instructed 200 each 1  . pantoprazole (PROTONIX)  40 MG tablet Take 1 tablet (40 mg total) by mouth daily. 90 tablet 1  . quinapril (ACCUPRIL) 20 MG tablet Take 1 tablet (20 mg total) by mouth daily. 90 tablet 1  . sulfaSALAzine (AZULFIDINE) 500 MG EC tablet Take 1 tablet (500 mg total) by mouth at bedtime. 90 tablet 1  . ULTICARE MICRO PEN NEEDLES 32G X 4 MM MISC      No current facility-administered medications for this visit.      PHYSICAL EXAMINATION: ECOG PERFORMANCE STATUS: 1 - Symptomatic but completely ambulatory Vitals:   05/06/18 1316  BP: 135/60  Pulse: 60  Temp: (!) 96.9 F (36.1 C)   Filed Weights   05/06/18 1316  Weight: 157 lb 2 oz (71.3 kg)    Physical Exam  Constitutional: She is oriented to person, place, and time. No distress.  HENT:  Head: Normocephalic and atraumatic.  Mouth/Throat: Oropharynx is clear and moist.  Eyes: Pupils are equal, round, and reactive to light. Conjunctivae and EOM are normal. No scleral icterus.  Neck: Normal range of motion. Neck supple.  Cardiovascular: Normal rate, regular rhythm and normal heart sounds.  Pulmonary/Chest: Effort normal and breath sounds normal. No respiratory distress. She has no wheezes. She has no rales. She exhibits no tenderness.  Abdominal: Soft. Bowel sounds are normal. She exhibits no distension and no mass. There is no tenderness.  Musculoskeletal: Normal range of motion. She exhibits no edema or deformity.  Lymphadenopathy:    She has no cervical adenopathy.  Neurological: She is alert and oriented to person, place, and time. No cranial nerve deficit. Coordination normal.  Skin: Skin is warm and dry. No rash noted. No erythema.  Psychiatric: She has a normal mood and affect. Her behavior is normal. Thought content normal.     LABORATORY DATA:  I have reviewed the data as listed Lab Results  Component Value Date   WBC 2.6 (L) 05/06/2018   HGB 10.0 (L) 05/06/2018   HCT 31.9 (L) 05/06/2018   MCV 97.9 05/06/2018   PLT 139 (L) 05/06/2018   Recent  Labs    04/26/18 1243 05/03/18 1022 05/06/18 1203  NA 141 138 138  K 4.0 4.1 4.1  CL 106 109 108  CO2 _0 GLUCOSE 138* 217* 151*  BUN _1 CREATININE 1.07* 1.01* 1.01*  CALCIUM 8.9 8.9 8.8*  GFRNONAA 48* 52* 52*  GFRAA 56* >60 60*  PROT 6.6 6.4* 6.5  ALBUMIN 3.8 3.6 3.7  AST _2 ALT _3 ALKPHOS 107 106 112  BILITOT 0.6 0.7 0.4       ASSESSMENT & PLAN:  1. MDS (myelodysplastic syndrome) with 5q deletion (  [place])   2. Thrombocytopenia ([place])   3. Encounter for antineoplastic chemotherapy   4. Other fatigue   5. 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)     # MDS: S/p 5 cycles of azacitidine.  Patient tolerates well Labs reviewed and discussed with patient She has good hematological treatment response with improvement of platelet counts Counts acceptable to proceed with cycle 6-day 1-5 azacitidine.  # Thrombocytopenia: Platelet counts improved to 1 39,000.  Continue monitor..  Continue monitor.   # Anemia, stable at baseline.  Hemoglobin slightly improved to 10.  Continue monitor...  # Fatigue: Chronic at baseline. #Neutropenia with ANC of 1.3 She has had weekly CBC done after last cycle of treatment.  Her [place] level has been stable during the interval. We will hold additional weekly labs for now.  She will repeat labs in 4 weeks.  Patient is educated that if she spiked a fever she needs to inform MD immediately.  All questions were answered. The patient knows to call the clinic with any problems questions or concerns. Follow-up in 4 weeks for next cycle of treatment. Total face to face encounter time for this patient visit was 25 min. >50% of the time was  spent in counseling and coordination of care.   Earlie Server, MD, PhD Hematology Oncology [facility] Pager- [phone] 05/07/2018

## 2018-05-08 ENCOUNTER — Inpatient Hospital Stay: Payer: Medicare Other

## 2018-05-08 VITALS — BP 106/63 | HR 72 | Temp 97.2°F | Resp 18 | Wt 157.4 lb

## 2018-05-08 DIAGNOSIS — R5381 Other malaise: Secondary | ICD-10-CM | POA: Diagnosis not present

## 2018-05-08 DIAGNOSIS — R5383 Other fatigue: Secondary | ICD-10-CM | POA: Diagnosis not present

## 2018-05-08 DIAGNOSIS — D46C Myelodysplastic syndrome with isolated del(5q) chromosomal abnormality: Secondary | ICD-10-CM | POA: Diagnosis not present

## 2018-05-08 DIAGNOSIS — Z5111 Encounter for antineoplastic chemotherapy: Secondary | ICD-10-CM | POA: Diagnosis not present

## 2018-05-08 DIAGNOSIS — D709 Neutropenia, unspecified: Secondary | ICD-10-CM | POA: Diagnosis not present

## 2018-05-08 DIAGNOSIS — D696 Thrombocytopenia, unspecified: Secondary | ICD-10-CM | POA: Diagnosis not present

## 2018-05-08 MED ORDER — ONDANSETRON HCL 4 MG PO TABS
8.0000 mg | ORAL_TABLET | Freq: Once | ORAL | Status: DC
  Filled 2018-05-08: qty 2

## 2018-05-08 MED ORDER — AZACITIDINE CHEMO SQ INJECTION
75.0000 mg/m2 | Freq: Once | INTRAMUSCULAR | Status: AC
  Administered 2018-05-08: 132.5 mg via SUBCUTANEOUS
  Filled 2018-05-08: qty 5.3

## 2018-05-09 ENCOUNTER — Inpatient Hospital Stay: Payer: Medicare Other

## 2018-05-09 VITALS — BP 137/62 | HR 83 | Temp 96.7°F | Resp 20

## 2018-05-09 DIAGNOSIS — D46C Myelodysplastic syndrome with isolated del(5q) chromosomal abnormality: Secondary | ICD-10-CM | POA: Diagnosis not present

## 2018-05-09 DIAGNOSIS — R5381 Other malaise: Secondary | ICD-10-CM | POA: Diagnosis not present

## 2018-05-09 DIAGNOSIS — D696 Thrombocytopenia, unspecified: Secondary | ICD-10-CM | POA: Diagnosis not present

## 2018-05-09 DIAGNOSIS — Z5111 Encounter for antineoplastic chemotherapy: Secondary | ICD-10-CM | POA: Diagnosis not present

## 2018-05-09 DIAGNOSIS — R5383 Other fatigue: Secondary | ICD-10-CM | POA: Diagnosis not present

## 2018-05-09 DIAGNOSIS — D709 Neutropenia, unspecified: Secondary | ICD-10-CM | POA: Diagnosis not present

## 2018-05-09 MED ORDER — AZACITIDINE CHEMO SQ INJECTION
75.0000 mg/m2 | Freq: Once | INTRAMUSCULAR | Status: AC
  Administered 2018-05-09: 132.5 mg via SUBCUTANEOUS
  Filled 2018-05-09: qty 5.3

## 2018-05-09 MED ORDER — ONDANSETRON HCL 4 MG PO TABS: 8.0000 mg | ORAL_TABLET | Freq: Once | ORAL | Status: DC

## 2018-05-10 ENCOUNTER — Inpatient Hospital Stay: Payer: Medicare Other | Attending: Oncology

## 2018-05-10 VITALS — BP 127/68 | HR 62 | Temp 98.0°F | Resp 18

## 2018-05-10 DIAGNOSIS — D46C Myelodysplastic syndrome with isolated del(5q) chromosomal abnormality: Secondary | ICD-10-CM | POA: Diagnosis not present

## 2018-05-10 DIAGNOSIS — D709 Neutropenia, unspecified: Secondary | ICD-10-CM | POA: Diagnosis not present

## 2018-05-10 DIAGNOSIS — D696 Thrombocytopenia, unspecified: Secondary | ICD-10-CM | POA: Diagnosis not present

## 2018-05-10 MED ORDER — AZACITIDINE CHEMO SQ INJECTION
75.0000 mg/m2 | Freq: Once | INTRAMUSCULAR | Status: AC
  Administered 2018-05-10: 132.5 mg via SUBCUTANEOUS
  Filled 2018-05-10: qty 5.3

## 2018-05-10 MED ORDER — ONDANSETRON HCL 4 MG PO TABS: 8.0000 mg | ORAL_TABLET | Freq: Once | ORAL | Status: DC

## 2018-06-03 ENCOUNTER — Other Ambulatory Visit: Payer: Self-pay | Admitting: Internal Medicine

## 2018-06-04 ENCOUNTER — Other Ambulatory Visit: Payer: Self-pay | Admitting: Oncology

## 2018-06-10 ENCOUNTER — Inpatient Hospital Stay: Payer: Medicare Other | Attending: Oncology

## 2018-06-10 ENCOUNTER — Encounter: Payer: Self-pay | Admitting: Oncology

## 2018-06-10 ENCOUNTER — Inpatient Hospital Stay (HOSPITAL_BASED_OUTPATIENT_CLINIC_OR_DEPARTMENT_OTHER): Payer: Medicare Other | Admitting: Oncology

## 2018-06-10 ENCOUNTER — Inpatient Hospital Stay: Payer: Medicare Other

## 2018-06-10 ENCOUNTER — Other Ambulatory Visit: Payer: Self-pay

## 2018-06-10 VITALS — BP 134/69 | HR 56 | Temp 97.0°F | Resp 18 | Wt 158.0 lb

## 2018-06-10 DIAGNOSIS — D696 Thrombocytopenia, unspecified: Secondary | ICD-10-CM | POA: Diagnosis not present

## 2018-06-10 DIAGNOSIS — Z79899 Other long term (current) drug therapy: Secondary | ICD-10-CM

## 2018-06-10 DIAGNOSIS — Z7984 Long term (current) use of oral hypoglycemic drugs: Secondary | ICD-10-CM

## 2018-06-10 DIAGNOSIS — E079 Disorder of thyroid, unspecified: Secondary | ICD-10-CM | POA: Diagnosis not present

## 2018-06-10 DIAGNOSIS — T451X5S Adverse effect of antineoplastic and immunosuppressive drugs, sequela: Secondary | ICD-10-CM

## 2018-06-10 DIAGNOSIS — D46C Myelodysplastic syndrome with isolated del(5q) chromosomal abnormality: Secondary | ICD-10-CM

## 2018-06-10 DIAGNOSIS — D701 Agranulocytosis secondary to cancer chemotherapy: Secondary | ICD-10-CM | POA: Insufficient documentation

## 2018-06-10 DIAGNOSIS — R5383 Other fatigue: Secondary | ICD-10-CM | POA: Insufficient documentation

## 2018-06-10 DIAGNOSIS — Z5111 Encounter for antineoplastic chemotherapy: Secondary | ICD-10-CM | POA: Diagnosis not present

## 2018-06-10 DIAGNOSIS — E538 Deficiency of other specified B group vitamins: Secondary | ICD-10-CM | POA: Diagnosis not present

## 2018-06-10 DIAGNOSIS — E119 Type 2 diabetes mellitus without complications: Secondary | ICD-10-CM | POA: Diagnosis not present

## 2018-06-10 DIAGNOSIS — R040 Epistaxis: Secondary | ICD-10-CM

## 2018-06-10 DIAGNOSIS — K760 Fatty (change of) liver, not elsewhere classified: Secondary | ICD-10-CM | POA: Insufficient documentation

## 2018-06-10 DIAGNOSIS — D6189 Other specified aplastic anemias and other bone marrow failure syndromes: Secondary | ICD-10-CM | POA: Diagnosis not present

## 2018-06-10 HISTORY — DX: Deficiency of other specified B group vitamins: E53.8

## 2018-06-10 LAB — CBC WITH DIFFERENTIAL/PLATELET
ABS IMMATURE GRANULOCYTES: 0.01 10*3/uL (ref 0.00–0.07)
BASOS PCT: 1 %
Basophils Absolute: 0 10*3/uL (ref 0.0–0.1)
Eosinophils Absolute: 0.1 10*3/uL (ref 0.0–0.5)
Eosinophils Relative: 3 %
HCT: 31.9 % — ABNORMAL LOW (ref 36.0–46.0)
Hemoglobin: 9.9 g/dL — ABNORMAL LOW (ref 12.0–15.0)
IMMATURE GRANULOCYTES: 0 %
Lymphocytes Relative: 42 %
Lymphs Abs: 1.1 10*3/uL (ref 0.7–4.0)
MCH: 30.8 pg (ref 26.0–34.0)
MCHC: 31 g/dL (ref 30.0–36.0)
MCV: 99.4 fL (ref 80.0–100.0)
Monocytes Absolute: 0.4 10*3/uL (ref 0.1–1.0)
Monocytes Relative: 14 %
NEUTROS ABS: 1.1 10*3/uL — AB (ref 1.7–7.7)
NEUTROS PCT: 40 %
NRBC: 0 % (ref 0.0–0.2)
PLATELETS: 124 10*3/uL — AB (ref 150–400)
RBC: 3.21 MIL/uL — AB (ref 3.87–5.11)
RDW: 18 % — AB (ref 11.5–15.5)
WBC: 2.6 10*3/uL — ABNORMAL LOW (ref 4.0–10.5)

## 2018-06-10 LAB — COMPREHENSIVE METABOLIC PANEL
ALT: 18 U/L (ref 0–44)
AST: 19 U/L (ref 15–41)
Albumin: 3.5 g/dL (ref 3.5–5.0)
Alkaline Phosphatase: 111 U/L (ref 38–126)
Anion gap: 8 (ref 5–15)
BUN: 21 mg/dL (ref 8–23)
CHLORIDE: 106 mmol/L (ref 98–111)
CO2: 24 mmol/L (ref 22–32)
CREATININE: 0.99 mg/dL (ref 0.44–1.00)
Calcium: 9.1 mg/dL (ref 8.9–10.3)
GFR, EST NON AFRICAN AMERICAN: 54 mL/min — AB (ref >60)
Glucose, Bld: 139 mg/dL — ABNORMAL HIGH (ref 70–99)
Potassium: 4.3 mmol/L (ref 3.5–5.1)
SODIUM: 138 mmol/L (ref 135–145)
Total Bilirubin: 0.5 mg/dL (ref 0.3–1.2)
Total Protein: 6.4 g/dL — ABNORMAL LOW (ref 6.5–8.1)

## 2018-06-10 LAB — FOLATE: Folate: 15.9 ng/mL (ref >5.9)

## 2018-06-10 LAB — VITAMIN B12: Vitamin B-12: 123 pg/mL — ABNORMAL LOW (ref 180–914)

## 2018-06-10 MED ORDER — ONDANSETRON HCL 4 MG PO TABS: 8.0000 mg | ORAL_TABLET | Freq: Once | ORAL | Status: DC

## 2018-06-10 MED ORDER — AZACITIDINE CHEMO SQ INJECTION
75.0000 mg/m2 | Freq: Once | INTRAMUSCULAR | Status: AC
  Administered 2018-06-10: 132.5 mg via SUBCUTANEOUS
  Filled 2018-06-10: qty 5.3

## 2018-06-10 NOTE — Addendum Note (Signed)
Addended by: Earlie Server on: 06/10/2018 11:21 PM   Modules accepted: Orders

## 2018-06-10 NOTE — Progress Notes (Addendum)
Hematology/Oncology Follow up note [facility] Telephone:(336) [phone] Fax:(336) [phone]   Patient Care Team: Glean Hess, MD as PCP - General (Internal Medicine)  REFERRING PROVIDER: Glean Hess, MD  Previous Oncologist Dr.Srujitha Murkutla REASON FOR VISIT Follow up for treatment of MDS  HISTORY OF PRESENTING ILLNESS:  Yesenia Hart is a  77 y.o.  female who recently moved from [place] to [place].  She used to live at [place] with husband.  Accompanied by daughter, [name] who is a respiratory therapist at [employer].  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  Patient presented to emergency room in January 2019 with complaints of intermittent rectal bleeding and increased to have a hemoglobin of 3 and platelet counts was 59,000.  Received 5 units of PRBC and was discharged home with hemoglobin of 10.  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  Cauterization was deferred due to thrombocytopenia. Aspirin was discontinued due to GI bleeding. Peripheral blood flow, SPEP negative, no obvious signs of B12 deficiency.  Ultrasound of the abdomen showed mild fatty liver.  On October 04, 2017, patient underwent bone marrow biopsy. Bone marrow core biopsy, bone marrow aspirate clot, bone marrow aspirate smears showed  single linage dysplasia.  Less than 5% bone marrow blasts and no circulating blast. Iron storage is nearly absent Peripheral blood smear showed moderate and anisopoikilocytosis Marrow aspirate normal in  adenoid maturation Cytogenetics performed at the [facility] in [place] showed 46,xx, t(3;17;16)(p21;q21;q24), add (5) (q11,2), idic(22)(p11,2)[18]/46,xx[2].  20 metaphases, lites were normal and 18 metaphases 5 q. deletion, it has translocation involving  chromosome of 3, 17, 16, and iso-di centric 22.  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  Patient received IV iron infusion on moving to [place].  She has not had any treatment for MDS done due to the move.  Today patient is accompanied by her daughter to the clinic.  She reports feeling tired, no weight loss.  She reports easy bruising.  Intermittent epistaxis which spontaneously resolved after applying pressure denies any hematochezia, hematemesis, hemoptysis.  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 Although patient does have 5q deletion which if isolated, is a good cytogentic group, she carried other cytogentic abnormalities which increases her IPSS-R score.   INTERVAL HISTORY Yesenia Hart is a 77 y.o. female who has above history reviewed by me today presents for assessment prior to MDS treatment with azacitidine.  Tolerates treatment so far.  Denies hematochezia, hematuria, hematemesis, epistaxis, black tarry stool or easy bruising.  Chronic fatigue is at baseline.  Thrombocytopenia stable.   Review of Systems  Constitutional: Positive for fatigue. Negative for appetite change, chills and fever.  HENT:   Negative for hearing loss and voice change.   Eyes: Negative for eye problems.  Respiratory: Negative for chest tightness and cough.   Cardiovascular: Negative for chest pain.  Gastrointestinal: Negative for abdominal distention, abdominal pain and blood in stool.  Endocrine: Negative for hot flashes.  Genitourinary: Negative for difficulty urinating and frequency.   Musculoskeletal: Negative for arthralgias.  Skin: Negative for itching and rash.  Neurological: Negative for extremity weakness.  Hematological: Negative for adenopathy.  Psychiatric/Behavioral: Negative for confusion.    MEDICAL HISTORY:  Past Medical History:  Diagnosis Date  . Anemia   .  Clotting  disorder ([place])   . Diabetes mellitus without complication ([place])   . IBS (irritable bowel syndrome)   . Iron deficiency anemia due to chronic blood loss 12/12/2017  . MDS (myelodysplastic syndrome) ([place])   . MDS (myelodysplastic syndrome) ([place])   . Thyroid disease     SURGICAL HISTORY: Past Surgical History:  Procedure Laterality Date  . APPENDECTOMY    . breast biopsy 8'    . CHOLECYSTECTOMY    . CORONARY ANGIOPLASTY WITH STENT PLACEMENT  2016   in [place]: Social History   Socioeconomic History  . Marital status: Married    Spouse name: Not on file  . Number of children: Not on file  . Years of education: Not on file  . Highest education level: Not on file  Occupational History  . Occupation: retired  Scientific laboratory technician  . Financial resource strain: Not on file  . Food insecurity:    Worry: Not on file    Inability: Not on file  . Transportation needs:    Medical: Not on file    Non-medical: Not on file  Tobacco Use  . Smoking status: Former Smoker    Packs/day: 1.00    Types: Cigarettes    Last attempt to quit: 1999    Years since quitting: 20.9  . Smokeless tobacco: Never Used  Substance and Sexual Activity  . Alcohol use: Not Currently  . Drug use: Never  . Sexual activity: Yes  Lifestyle  . Physical activity:    Days per week: Not on file    Minutes per session: Not on file  . Stress: Not on file  Relationships  . Social connections:    Talks on phone: Not on file    Gets together: Not on file    Attends religious service: Not on file    Active member of club or organization: Not on file    Attends meetings of clubs or organizations: Not on file    Relationship status: Not on file  . Intimate partner violence:    Fear of current or ex partner: Not on file    Emotionally abused: Not on file    Physically abused: Not on file    Forced sexual activity: Not on file  Other Topics Concern  . Not on file  Social History Narrative  . Not  on file    FAMILY HISTORY: Family History  Adopted: Yes  Family history unknown: Yes    ALLERGIES:  is allergic to acarbose; demerol [meperidine hcl]; meperidine; and pioglitazone.  MEDICATIONS:  Current Outpatient Medications  Medication Sig Dispense Refill  . atorvastatin (LIPITOR) 40 MG tablet Take 1 tablet (40 mg total) by mouth daily. 90 tablet 1  . azaCITIDine in lactated ringers infusion Inject 100 mg/m2 into the vein daily.    . furosemide (LASIX) 20 MG tablet TAKE 1 TABLET BY MOUTH ONCE DAILY 30 tablet 3  . glimepiride (AMARYL) 4 MG tablet Take 1 tablet (4 mg total) by mouth daily. 90 tablet 1  . Lancets (ONETOUCH ULTRASOFT) lancets Use as instructed 100 each 12  . levothyroxine (SYNTHROID, LEVOTHROID) 50 MCG tablet Take 1 tablet (50 mcg total) by mouth daily before breakfast. 90 tablet 1  . NOVOLOG MIX 70/30 FLEXPEN (70-30) 100 UNIT/ML FlexPen Inject 0.1-0.15 mLs (10-15 Units total) into the skin 2 (two) times daily with a meal. (Patient taking differently: Inject 8 Units into the skin 2 (two) times daily with a meal. ) 21  mL 1  . nystatin (MYCOSTATIN/NYSTOP) powder nystatin 100,000 unit/gram topical powder   1 app by topical route.    . ondansetron (ZOFRAN) 8 MG tablet Take 1 tablet (8 mg total) by mouth 2 (two) times daily as needed (Nausea or vomiting). 30 tablet 1  . ONE TOUCH ULTRA TEST test strip 1 each by Other route 2 (two) times daily. Use as instructed 200 each 1  . pantoprazole (PROTONIX) 40 MG tablet Take 1 tablet (40 mg total) by mouth daily. 90 tablet 1  . quinapril (ACCUPRIL) 20 MG tablet Take 1 tablet (20 mg total) by mouth daily. 90 tablet 1  . sulfaSALAzine (AZULFIDINE) 500 MG EC tablet Take 1 tablet (500 mg total) by mouth at bedtime. 90 tablet 1  . ULTICARE MICRO PEN NEEDLES 32G X 4 MM MISC      No current facility-administered medications for this visit.      PHYSICAL EXAMINATION: ECOG PERFORMANCE STATUS: 1 - Symptomatic but completely  ambulatory Vitals:   06/10/18 1343  BP: 134/69  Pulse: (!) 56  Resp: 18  Temp: (!) 97 F (36.1 C)   Filed Weights   06/10/18 1343  Weight: 158 lb (71.7 kg)    Physical Exam  Constitutional: She is oriented to person, place, and time. No distress.  HENT:  Head: Normocephalic and atraumatic.  Mouth/Throat: Oropharynx is clear and moist.  Eyes: Pupils are equal, round, and reactive to light. Conjunctivae and EOM are normal. No scleral icterus.  Neck: Normal range of motion. Neck supple.  Cardiovascular: Normal rate, regular rhythm and normal heart sounds.  Pulmonary/Chest: Effort normal and breath sounds normal. No respiratory distress. She has no wheezes. She has no rales. She exhibits no tenderness.  Abdominal: Soft. Bowel sounds are normal. She exhibits no distension and no mass. There is no tenderness.  Musculoskeletal: Normal range of motion. She exhibits no edema or deformity.  Lymphadenopathy:    She has no cervical adenopathy.  Neurological: She is alert and oriented to person, place, and time. No cranial nerve deficit. Coordination normal.  Skin: Skin is warm and dry. No rash noted. No erythema.  Psychiatric: She has a normal mood and affect. Her behavior is normal. Thought content normal.     LABORATORY DATA:  I have reviewed the data as listed Lab Results  Component Value Date   WBC 2.6 (L) 06/10/2018   HGB 9.9 (L) 06/10/2018   HCT 31.9 (L) 06/10/2018   MCV 99.4 06/10/2018   PLT 124 (L) 06/10/2018   Recent Labs    05/03/18 1022 05/06/18 1203 06/10/18 1326  NA 138 138 138  K 4.1 4.1 4.3  CL 109 108 106  CO2 24 24 24   GLUCOSE 217* 151* 139*  BUN 19 21 21   CREATININE 1.01* 1.01* 0.99  CALCIUM 8.9 8.8* 9.1  GFRNONAA 52* 52* 54*  GFRAA >60 60* >60  PROT 6.4* 6.5 6.4*  ALBUMIN 3.6 3.7 3.5  AST 22 20 19   ALT 14 15 18   ALKPHOS 106 112 111  BILITOT 0.7 0.4 0.5       ASSESSMENT & PLAN:  1. MDS (myelodysplastic syndrome) with 5q deletion ([place])   2.  Thrombocytopenia ([place])   3. Encounter for antineoplastic chemotherapy   4. 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 B12 deficiency     # MDS: S/p 56 cycles of azacitidine.  Patient tolerates well Labs reviewed and discussed with patient.  Good hematological response with improvement of platelet  counts.  Counts are acceptable to proceed with cycle 7 Azacitadine.   # Thrombocytopenia, platelet counts stable.  Continue to monitor.  # Chemotherapy induced neutropenia, Grade 2. Repeat cbc in 2 weeks.  # Anemia, hemoglobin stable. Continue to monitor.  # B12 deficiency. B12 level decreased. Will need to start her parental B12 supplements.   We spent sufficient time to discuss many aspect of care, questions were answered to patient's satisfaction. The patient knows to call the clinic with any problems questions or concerns. Follow-up in 4 weeks for next cycle of treatment. Total face to face encounter time for this patient visit was  25 min. >50% of the time was  spent in counseling and coordination of care.   Earlie Server, MD, PhD Hematology Oncology [facility] Pager- [phone] 06/10/2018

## 2018-06-10 NOTE — Progress Notes (Signed)
Patient here for follow up. No concerns voiced.  °

## 2018-06-11 ENCOUNTER — Inpatient Hospital Stay: Payer: Medicare Other

## 2018-06-11 VITALS — BP 132/68 | HR 60 | Temp 98.7°F | Resp 18

## 2018-06-11 DIAGNOSIS — D46C Myelodysplastic syndrome with isolated del(5q) chromosomal abnormality: Secondary | ICD-10-CM

## 2018-06-11 DIAGNOSIS — D696 Thrombocytopenia, unspecified: Secondary | ICD-10-CM | POA: Diagnosis not present

## 2018-06-11 DIAGNOSIS — E538 Deficiency of other specified B group vitamins: Secondary | ICD-10-CM

## 2018-06-11 DIAGNOSIS — D701 Agranulocytosis secondary to cancer chemotherapy: Secondary | ICD-10-CM | POA: Diagnosis not present

## 2018-06-11 DIAGNOSIS — D6189 Other specified aplastic anemias and other bone marrow failure syndromes: Secondary | ICD-10-CM | POA: Diagnosis not present

## 2018-06-11 DIAGNOSIS — Z5111 Encounter for antineoplastic chemotherapy: Secondary | ICD-10-CM | POA: Diagnosis not present

## 2018-06-11 MED ORDER — AZACITIDINE CHEMO SQ INJECTION
75.0000 mg/m2 | Freq: Once | INTRAMUSCULAR | Status: AC
  Administered 2018-06-11: 132.5 mg via SUBCUTANEOUS
  Filled 2018-06-11: qty 5.3

## 2018-06-11 MED ORDER — ONDANSETRON HCL 4 MG PO TABS: 8.0000 mg | ORAL_TABLET | Freq: Once | ORAL | Status: DC

## 2018-06-11 MED ORDER — CYANOCOBALAMIN 1000 MCG/ML IJ SOLN
1000.0000 ug | Freq: Once | INTRAMUSCULAR | Status: AC
  Administered 2018-06-11: 1000 ug via INTRAMUSCULAR
  Filled 2018-06-11: qty 1

## 2018-06-12 ENCOUNTER — Inpatient Hospital Stay: Payer: Medicare Other

## 2018-06-12 VITALS — BP 124/64 | HR 78 | Temp 96.7°F | Resp 18

## 2018-06-12 DIAGNOSIS — D696 Thrombocytopenia, unspecified: Secondary | ICD-10-CM | POA: Diagnosis not present

## 2018-06-12 DIAGNOSIS — D46C Myelodysplastic syndrome with isolated del(5q) chromosomal abnormality: Secondary | ICD-10-CM

## 2018-06-12 DIAGNOSIS — E538 Deficiency of other specified B group vitamins: Secondary | ICD-10-CM

## 2018-06-12 DIAGNOSIS — Z5111 Encounter for antineoplastic chemotherapy: Secondary | ICD-10-CM | POA: Diagnosis not present

## 2018-06-12 DIAGNOSIS — D701 Agranulocytosis secondary to cancer chemotherapy: Secondary | ICD-10-CM | POA: Diagnosis not present

## 2018-06-12 DIAGNOSIS — D6189 Other specified aplastic anemias and other bone marrow failure syndromes: Secondary | ICD-10-CM | POA: Diagnosis not present

## 2018-06-12 MED ORDER — CYANOCOBALAMIN 1000 MCG/ML IJ SOLN
1000.0000 ug | Freq: Once | INTRAMUSCULAR | Status: AC
  Administered 2018-06-12: 1000 ug via INTRAMUSCULAR
  Filled 2018-06-12: qty 1

## 2018-06-12 MED ORDER — AZACITIDINE CHEMO SQ INJECTION
75.0000 mg/m2 | Freq: Once | INTRAMUSCULAR | Status: AC
  Administered 2018-06-12: 132.5 mg via SUBCUTANEOUS
  Filled 2018-06-12: qty 5.3

## 2018-06-13 ENCOUNTER — Inpatient Hospital Stay: Payer: Medicare Other

## 2018-06-13 VITALS — BP 135/58 | HR 60 | Temp 96.3°F | Resp 20

## 2018-06-13 DIAGNOSIS — D701 Agranulocytosis secondary to cancer chemotherapy: Secondary | ICD-10-CM | POA: Diagnosis not present

## 2018-06-13 DIAGNOSIS — E538 Deficiency of other specified B group vitamins: Secondary | ICD-10-CM | POA: Diagnosis not present

## 2018-06-13 DIAGNOSIS — D46C Myelodysplastic syndrome with isolated del(5q) chromosomal abnormality: Secondary | ICD-10-CM | POA: Diagnosis not present

## 2018-06-13 DIAGNOSIS — Z5111 Encounter for antineoplastic chemotherapy: Secondary | ICD-10-CM | POA: Diagnosis not present

## 2018-06-13 DIAGNOSIS — D6189 Other specified aplastic anemias and other bone marrow failure syndromes: Secondary | ICD-10-CM | POA: Diagnosis not present

## 2018-06-13 DIAGNOSIS — D696 Thrombocytopenia, unspecified: Secondary | ICD-10-CM | POA: Diagnosis not present

## 2018-06-13 MED ORDER — AZACITIDINE CHEMO SQ INJECTION
75.0000 mg/m2 | Freq: Once | INTRAMUSCULAR | Status: AC
  Administered 2018-06-13: 132.5 mg via SUBCUTANEOUS
  Filled 2018-06-13: qty 5.3

## 2018-06-13 MED ORDER — ONDANSETRON HCL 4 MG PO TABS: 8.0000 mg | ORAL_TABLET | Freq: Once | ORAL | Status: DC

## 2018-06-13 MED ORDER — CYANOCOBALAMIN 1000 MCG/ML IJ SOLN
1000.0000 ug | Freq: Once | INTRAMUSCULAR | Status: AC
  Administered 2018-06-13: 1000 ug via INTRAMUSCULAR
  Filled 2018-06-13: qty 1

## 2018-06-14 ENCOUNTER — Inpatient Hospital Stay: Payer: Medicare Other

## 2018-06-14 VITALS — BP 133/70 | HR 83 | Temp 95.2°F | Resp 18

## 2018-06-14 DIAGNOSIS — D46C Myelodysplastic syndrome with isolated del(5q) chromosomal abnormality: Secondary | ICD-10-CM

## 2018-06-14 DIAGNOSIS — E538 Deficiency of other specified B group vitamins: Secondary | ICD-10-CM

## 2018-06-14 DIAGNOSIS — D701 Agranulocytosis secondary to cancer chemotherapy: Secondary | ICD-10-CM | POA: Diagnosis not present

## 2018-06-14 DIAGNOSIS — D696 Thrombocytopenia, unspecified: Secondary | ICD-10-CM | POA: Diagnosis not present

## 2018-06-14 DIAGNOSIS — D6189 Other specified aplastic anemias and other bone marrow failure syndromes: Secondary | ICD-10-CM | POA: Diagnosis not present

## 2018-06-14 DIAGNOSIS — Z5111 Encounter for antineoplastic chemotherapy: Secondary | ICD-10-CM | POA: Diagnosis not present

## 2018-06-14 MED ORDER — AZACITIDINE CHEMO SQ INJECTION
75.0000 mg/m2 | Freq: Once | INTRAMUSCULAR | Status: AC
  Administered 2018-06-14: 132.5 mg via SUBCUTANEOUS
  Filled 2018-06-14: qty 5.3

## 2018-06-14 MED ORDER — CYANOCOBALAMIN 1000 MCG/ML IJ SOLN
1000.0000 ug | Freq: Once | INTRAMUSCULAR | Status: AC
  Administered 2018-06-14: 1000 ug via INTRAMUSCULAR

## 2018-06-14 MED ORDER — ONDANSETRON HCL 4 MG PO TABS: 8.0000 mg | ORAL_TABLET | Freq: Once | ORAL | Status: DC

## 2018-06-17 ENCOUNTER — Inpatient Hospital Stay: Payer: Medicare Other

## 2018-06-17 DIAGNOSIS — E538 Deficiency of other specified B group vitamins: Secondary | ICD-10-CM

## 2018-06-17 DIAGNOSIS — D701 Agranulocytosis secondary to cancer chemotherapy: Secondary | ICD-10-CM | POA: Diagnosis not present

## 2018-06-17 DIAGNOSIS — D6189 Other specified aplastic anemias and other bone marrow failure syndromes: Secondary | ICD-10-CM | POA: Diagnosis not present

## 2018-06-17 DIAGNOSIS — Z5111 Encounter for antineoplastic chemotherapy: Secondary | ICD-10-CM | POA: Diagnosis not present

## 2018-06-17 DIAGNOSIS — D696 Thrombocytopenia, unspecified: Secondary | ICD-10-CM | POA: Diagnosis not present

## 2018-06-17 DIAGNOSIS — D46C Myelodysplastic syndrome with isolated del(5q) chromosomal abnormality: Secondary | ICD-10-CM | POA: Diagnosis not present

## 2018-06-17 MED ORDER — CYANOCOBALAMIN 1000 MCG/ML IJ SOLN
1000.0000 ug | Freq: Once | INTRAMUSCULAR | Status: AC
  Administered 2018-06-17: 1000 ug via INTRAMUSCULAR

## 2018-06-24 ENCOUNTER — Inpatient Hospital Stay: Payer: Medicare Other

## 2018-06-24 ENCOUNTER — Other Ambulatory Visit: Payer: Self-pay

## 2018-06-24 DIAGNOSIS — D46C Myelodysplastic syndrome with isolated del(5q) chromosomal abnormality: Secondary | ICD-10-CM

## 2018-06-24 DIAGNOSIS — D701 Agranulocytosis secondary to cancer chemotherapy: Secondary | ICD-10-CM | POA: Diagnosis not present

## 2018-06-24 DIAGNOSIS — Z5111 Encounter for antineoplastic chemotherapy: Secondary | ICD-10-CM | POA: Diagnosis not present

## 2018-06-24 DIAGNOSIS — D696 Thrombocytopenia, unspecified: Secondary | ICD-10-CM | POA: Diagnosis not present

## 2018-06-24 DIAGNOSIS — E538 Deficiency of other specified B group vitamins: Secondary | ICD-10-CM | POA: Diagnosis not present

## 2018-06-24 DIAGNOSIS — D6189 Other specified aplastic anemias and other bone marrow failure syndromes: Secondary | ICD-10-CM | POA: Diagnosis not present

## 2018-06-24 LAB — CBC WITH DIFFERENTIAL/PLATELET
Abs Immature Granulocytes: 0.01 10*3/uL (ref 0.00–0.07)
Basophils Absolute: 0 10*3/uL (ref 0.0–0.1)
Basophils Relative: 1 %
Eosinophils Absolute: 0.1 10*3/uL (ref 0.0–0.5)
Eosinophils Relative: 3 %
HCT: 31.1 % — ABNORMAL LOW (ref 36.0–46.0)
Hemoglobin: 9.8 g/dL — ABNORMAL LOW (ref 12.0–15.0)
Immature Granulocytes: 0 %
Lymphocytes Relative: 33 %
Lymphs Abs: 1.4 10*3/uL (ref 0.7–4.0)
MCH: 30.9 pg (ref 26.0–34.0)
MCHC: 31.5 g/dL (ref 30.0–36.0)
MCV: 98.1 fL (ref 80.0–100.0)
Monocytes Absolute: 0.3 10*3/uL (ref 0.1–1.0)
Monocytes Relative: 7 %
Neutro Abs: 2.4 10*3/uL (ref 1.7–7.7)
Neutrophils Relative %: 56 %
PLATELETS: 51 10*3/uL — AB (ref 150–400)
RBC: 3.17 MIL/uL — AB (ref 3.87–5.11)
RDW: 17.2 % — AB (ref 11.5–15.5)
WBC: 4.2 10*3/uL (ref 4.0–10.5)
nRBC: 0 % (ref 0.0–0.2)

## 2018-06-24 LAB — COMPREHENSIVE METABOLIC PANEL
ALT: 18 U/L (ref 0–44)
AST: 21 U/L (ref 15–41)
Albumin: 3.7 g/dL (ref 3.5–5.0)
Alkaline Phosphatase: 114 U/L (ref 38–126)
Anion gap: 7 (ref 5–15)
BILIRUBIN TOTAL: 0.6 mg/dL (ref 0.3–1.2)
BUN: 19 mg/dL (ref 8–23)
CO2: 24 mmol/L (ref 22–32)
Calcium: 8.8 mg/dL — ABNORMAL LOW (ref 8.9–10.3)
Chloride: 107 mmol/L (ref 98–111)
Creatinine, Ser: 0.95 mg/dL (ref 0.44–1.00)
GFR calc Af Amer: >60 mL/min (ref >60)
GFR, EST NON AFRICAN AMERICAN: 57 mL/min — AB (ref >60)
Glucose, Bld: 92 mg/dL (ref 70–99)
Potassium: 4.1 mmol/L (ref 3.5–5.1)
Sodium: 138 mmol/L (ref 135–145)
TOTAL PROTEIN: 6.5 g/dL (ref 6.5–8.1)

## 2018-06-24 MED ORDER — CYANOCOBALAMIN 1000 MCG/ML IJ SOLN
1000.0000 ug | Freq: Once | INTRAMUSCULAR | Status: AC
  Administered 2018-06-24: 1000 ug via INTRAMUSCULAR

## 2018-06-28 ENCOUNTER — Other Ambulatory Visit: Payer: Self-pay | Admitting: Internal Medicine

## 2018-07-01 ENCOUNTER — Inpatient Hospital Stay: Payer: Medicare Other

## 2018-07-01 DIAGNOSIS — D46C Myelodysplastic syndrome with isolated del(5q) chromosomal abnormality: Secondary | ICD-10-CM | POA: Diagnosis not present

## 2018-07-01 DIAGNOSIS — D701 Agranulocytosis secondary to cancer chemotherapy: Secondary | ICD-10-CM | POA: Diagnosis not present

## 2018-07-01 DIAGNOSIS — Z5111 Encounter for antineoplastic chemotherapy: Secondary | ICD-10-CM | POA: Diagnosis not present

## 2018-07-01 DIAGNOSIS — D696 Thrombocytopenia, unspecified: Secondary | ICD-10-CM | POA: Diagnosis not present

## 2018-07-01 DIAGNOSIS — E538 Deficiency of other specified B group vitamins: Secondary | ICD-10-CM | POA: Diagnosis not present

## 2018-07-01 DIAGNOSIS — D6189 Other specified aplastic anemias and other bone marrow failure syndromes: Secondary | ICD-10-CM | POA: Diagnosis not present

## 2018-07-01 MED ORDER — CYANOCOBALAMIN 1000 MCG/ML IJ SOLN
1000.0000 ug | Freq: Once | INTRAMUSCULAR | Status: AC
  Administered 2018-07-01: 1000 ug via INTRAMUSCULAR

## 2018-07-04 ENCOUNTER — Other Ambulatory Visit: Payer: Self-pay

## 2018-07-04 MED ORDER — ULTICARE MICRO PEN NEEDLES 32G X 4 MM MISC: 1.0000 | Freq: Two times a day (BID) | 12 refills | Status: DC

## 2018-07-08 ENCOUNTER — Inpatient Hospital Stay: Payer: Medicare Other

## 2018-07-08 DIAGNOSIS — D46C Myelodysplastic syndrome with isolated del(5q) chromosomal abnormality: Secondary | ICD-10-CM | POA: Diagnosis not present

## 2018-07-08 DIAGNOSIS — E538 Deficiency of other specified B group vitamins: Secondary | ICD-10-CM | POA: Diagnosis not present

## 2018-07-08 DIAGNOSIS — Z5111 Encounter for antineoplastic chemotherapy: Secondary | ICD-10-CM | POA: Diagnosis not present

## 2018-07-08 DIAGNOSIS — D696 Thrombocytopenia, unspecified: Secondary | ICD-10-CM | POA: Diagnosis not present

## 2018-07-08 DIAGNOSIS — D701 Agranulocytosis secondary to cancer chemotherapy: Secondary | ICD-10-CM | POA: Diagnosis not present

## 2018-07-08 DIAGNOSIS — D6189 Other specified aplastic anemias and other bone marrow failure syndromes: Secondary | ICD-10-CM | POA: Diagnosis not present

## 2018-07-08 MED ORDER — CYANOCOBALAMIN 1000 MCG/ML IJ SOLN
1000.0000 ug | Freq: Once | INTRAMUSCULAR | Status: AC
  Administered 2018-07-08: 1000 ug via INTRAMUSCULAR

## 2018-07-15 ENCOUNTER — Other Ambulatory Visit: Payer: Self-pay

## 2018-07-15 ENCOUNTER — Encounter: Payer: Self-pay | Admitting: Oncology

## 2018-07-15 ENCOUNTER — Inpatient Hospital Stay: Payer: Medicare Other

## 2018-07-15 ENCOUNTER — Telehealth: Payer: Self-pay | Admitting: *Deleted

## 2018-07-15 ENCOUNTER — Inpatient Hospital Stay: Payer: Medicare Other | Attending: Oncology | Admitting: Oncology

## 2018-07-15 VITALS — BP 120/76 | HR 66 | Resp 18

## 2018-07-15 VITALS — BP 147/67 | HR 64 | Temp 97.1°F | Resp 18 | Wt 160.6 lb

## 2018-07-15 DIAGNOSIS — E538 Deficiency of other specified B group vitamins: Secondary | ICD-10-CM | POA: Diagnosis not present

## 2018-07-15 DIAGNOSIS — K5520 Angiodysplasia of colon without hemorrhage: Secondary | ICD-10-CM | POA: Diagnosis not present

## 2018-07-15 DIAGNOSIS — R040 Epistaxis: Secondary | ICD-10-CM | POA: Diagnosis not present

## 2018-07-15 DIAGNOSIS — D46C Myelodysplastic syndrome with isolated del(5q) chromosomal abnormality: Secondary | ICD-10-CM | POA: Diagnosis not present

## 2018-07-15 DIAGNOSIS — E119 Type 2 diabetes mellitus without complications: Secondary | ICD-10-CM | POA: Insufficient documentation

## 2018-07-15 DIAGNOSIS — Z87891 Personal history of nicotine dependence: Secondary | ICD-10-CM | POA: Diagnosis not present

## 2018-07-15 DIAGNOSIS — Z5111 Encounter for antineoplastic chemotherapy: Secondary | ICD-10-CM | POA: Insufficient documentation

## 2018-07-15 DIAGNOSIS — Z79899 Other long term (current) drug therapy: Secondary | ICD-10-CM | POA: Insufficient documentation

## 2018-07-15 DIAGNOSIS — K760 Fatty (change of) liver, not elsewhere classified: Secondary | ICD-10-CM | POA: Insufficient documentation

## 2018-07-15 DIAGNOSIS — R5382 Chronic fatigue, unspecified: Secondary | ICD-10-CM

## 2018-07-15 DIAGNOSIS — D696 Thrombocytopenia, unspecified: Secondary | ICD-10-CM | POA: Diagnosis not present

## 2018-07-15 DIAGNOSIS — D6189 Other specified aplastic anemias and other bone marrow failure syndromes: Secondary | ICD-10-CM

## 2018-07-15 DIAGNOSIS — Z7984 Long term (current) use of oral hypoglycemic drugs: Secondary | ICD-10-CM | POA: Insufficient documentation

## 2018-07-15 LAB — COMPREHENSIVE METABOLIC PANEL
ALT: 18 U/L (ref 0–44)
AST: 25 U/L (ref 15–41)
Albumin: 3.8 g/dL (ref 3.5–5.0)
Alkaline Phosphatase: 128 U/L — ABNORMAL HIGH (ref 38–126)
Anion gap: 8 (ref 5–15)
BILIRUBIN TOTAL: 0.7 mg/dL (ref 0.3–1.2)
BUN: 22 mg/dL (ref 8–23)
CO2: 24 mmol/L (ref 22–32)
Calcium: 8.8 mg/dL — ABNORMAL LOW (ref 8.9–10.3)
Chloride: 109 mmol/L (ref 98–111)
Creatinine, Ser: 0.96 mg/dL (ref 0.44–1.00)
GFR calc Af Amer: >60 mL/min (ref >60)
GFR calc non Af Amer: 56 mL/min — ABNORMAL LOW (ref >60)
Glucose, Bld: 122 mg/dL — ABNORMAL HIGH (ref 70–99)
Potassium: 4 mmol/L (ref 3.5–5.1)
Sodium: 141 mmol/L (ref 135–145)
TOTAL PROTEIN: 6.8 g/dL (ref 6.5–8.1)

## 2018-07-15 LAB — CBC WITH DIFFERENTIAL/PLATELET
Abs Immature Granulocytes: 0.01 10*3/uL (ref 0.00–0.07)
BASOS PCT: 1 %
Basophils Absolute: 0 10*3/uL (ref 0.0–0.1)
Eosinophils Absolute: 0.1 10*3/uL (ref 0.0–0.5)
Eosinophils Relative: 2 %
HCT: 34 % — ABNORMAL LOW (ref 36.0–46.0)
Hemoglobin: 10.8 g/dL — ABNORMAL LOW (ref 12.0–15.0)
Immature Granulocytes: 0 %
Lymphocytes Relative: 31 %
Lymphs Abs: 1.3 10*3/uL (ref 0.7–4.0)
MCH: 31.7 pg (ref 26.0–34.0)
MCHC: 31.8 g/dL (ref 30.0–36.0)
MCV: 99.7 fL (ref 80.0–100.0)
Monocytes Absolute: 0.4 10*3/uL (ref 0.1–1.0)
Monocytes Relative: 10 %
Neutro Abs: 2.4 10*3/uL (ref 1.7–7.7)
Neutrophils Relative %: 56 %
Platelets: 122 10*3/uL — ABNORMAL LOW (ref 150–400)
RBC: 3.41 MIL/uL — ABNORMAL LOW (ref 3.87–5.11)
RDW: 17.5 % — ABNORMAL HIGH (ref 11.5–15.5)
WBC: 4.2 10*3/uL (ref 4.0–10.5)
nRBC: 0 % (ref 0.0–0.2)

## 2018-07-15 MED ORDER — VITAMIN B-12 1000 MCG PO TABS: 1000.0000 ug | ORAL_TABLET | Freq: Every day | ORAL | 1 refills | Status: DC

## 2018-07-15 MED ORDER — ONDANSETRON HCL 4 MG PO TABS: 8.0000 mg | ORAL_TABLET | Freq: Once | ORAL | Status: DC

## 2018-07-15 MED ORDER — AZACITIDINE CHEMO SQ INJECTION
75.0000 mg/m2 | Freq: Once | INTRAMUSCULAR | Status: AC
  Administered 2018-07-15: 132.5 mg via SUBCUTANEOUS
  Filled 2018-07-15: qty 5.3

## 2018-07-15 NOTE — Telephone Encounter (Signed)
Called reporting that she has difficulty swallowing pills and does not think she can swallow the B12 tabs. Please advise

## 2018-07-15 NOTE — Progress Notes (Signed)
Patient here for follow up. No concerns voiced.  °

## 2018-07-15 NOTE — Telephone Encounter (Signed)
I spoke with patient who has not even picked up prescription yet, so she does not know if they are too big, I explained to her that they come in a sublingual form and to speak with pharmacist regarding this. She is in agreement with this plan

## 2018-07-15 NOTE — Progress Notes (Signed)
Hematology/Oncology Follow up note [facility] Telephone:([phone] Fax:([phone]) [phone]   Patient Care Team: Glean Hess, MD as PCP - General (Internal Medicine)  REFERRING PROVIDER: Glean Hess, MD  Previous Oncologist Dr.Srujitha Murkutla REASON FOR VISIT Follow up for treatment of MDS  HISTORY OF PRESENTING ILLNESS:  Yesenia Hart is a  78 y.o.  female who recently moved from [place] to [place].  She used to live at [place] with husband.  Accompanied by daughter, Yesenia Hart who is a respiratory therapist at [employer]  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  Patient presented to emergency room in January 2019 with complaints of intermittent rectal bleeding and increased to have a hemoglobin of 3 and platelet counts was 59,000.  Received 5 units of PRBC and was discharged home with hemoglobin of 10.  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  Cauterization was deferred due to thrombocytopenia. Aspirin was discontinued due to GI bleeding. Peripheral blood flow, SPEP negative, no obvious signs of B12 deficiency.  Ultrasound of the abdomen showed mild fatty liver.  On October 04, 2017, patient underwent bone marrow biopsy. Bone marrow core biopsy, bone marrow aspirate clot, bone marrow aspirate smears showed  single linage dysplasia.  Less than 5% bone marrow blasts and no circulating blast. 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  20 metaphases, lites were normal and 18 metaphases 5 q. deletion, it has translocation involving  chromosome of 3, 17, 16, and iso-di centric 22.  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  Patient received IV iron infusion on moving to [place].  She has not had any treatment for MDS done due to the move.  Today patient is accompanied by her daughter to the clinic.  She reports feeling tired, no weight loss.  She reports easy bruising.  Intermittent epistaxis which spontaneously resolved after applying pressure denies any hematochezia, hematemesis, hemoptysis.  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 Although patient does have 5q deletion which if isolated, is a good cytogentic group, she carried other cytogentic abnormalities which increases her IPSS-R score.   INTERVAL HISTORY Yesenia Hart is a 78 y.o. female who has above history reviewed by me today presents for assessment prior to MDS treatment with azacitidine.  #Patient tolerates treatment so far.  Denies any hematochezia, hematuria, hematemesis, epistaxis, black tarry stool or easy bruising.  Denies nausea vomiting. Feeling well.  Chronic fatigue is bedtime.  Thrombocytopenia stable. B12 deficiency s/p parental B12 injections, feeling good.   Review of Systems  Constitutional: Positive for fatigue. Negative for appetite change, chills and fever.  HENT:   Negative for hearing loss and voice change.   Eyes: Negative for eye problems.  Respiratory: Negative for chest tightness and cough.   Cardiovascular: Negative for chest pain.  Gastrointestinal: Negative for abdominal distention, abdominal pain and blood in stool.  Endocrine: Negative for hot flashes.  Genitourinary: Negative for difficulty urinating and frequency.   Musculoskeletal: Negative for arthralgias.  Skin: Negative for itching and rash.  Neurological: Negative for extremity weakness.  Hematological: Negative for adenopathy.  Psychiatric/Behavioral: Negative for  confusion.    MEDICAL HISTORY:  Past Medical History:  Diagnosis Date  . Anemia   . B12 deficiency 06/10/2018  . Clotting disorder ([place])   . Diabetes mellitus without complication ([place])   . IBS (irritable bowel syndrome)   . Iron deficiency anemia due to chronic blood loss 12/12/2017  . MDS (myelodysplastic syndrome) ([place])   . MDS (myelodysplastic syndrome) ([place])   . Thyroid disease     SURGICAL HISTORY: Past Surgical History:  Procedure Laterality Date  . APPENDECTOMY    . breast biopsy 75'    . CHOLECYSTECTOMY    . CORONARY ANGIOPLASTY WITH STENT PLACEMENT  2016   in [place]: Social History   Socioeconomic History  . Marital status: Married    Spouse name: Not on file  . Number of children: Not on file  . Years of education: Not on file  . Highest education level: Not on file  Occupational History  . Occupation: retired  Scientific laboratory technician  . Financial resource strain: Not on file  . Food insecurity:    Worry: Not on file    Inability: Not on file  . Transportation needs:    Medical: Not on file    Non-medical: Not on file  Tobacco Use  . Smoking status: Former Smoker    Packs/day: 1.00    Types: Cigarettes    Last attempt to quit: 1999    Years since quitting: 21.0  . Smokeless tobacco: Never Used  Substance and Sexual Activity  . Alcohol use: Not Currently  . Drug use: Never  . Sexual activity: Yes  Lifestyle  . Physical activity:    Days per week: Not on file    Minutes per session: Not on file  . Stress: Not on file  Relationships  . Social connections:    Talks on phone: Not on file    Gets together: Not on file    Attends religious service: Not on file    Active member of club or organization: Not on file    Attends meetings of clubs or organizations: Not on file    Relationship status: Not on file  . Intimate partner violence:    Fear of current or ex partner: Not on file    Emotionally abused: Not on file    Physically  abused: Not on file    Forced sexual activity: Not on file  Other Topics Concern  . Not on file  Social History Narrative  . Not on file    FAMILY HISTORY: Family History  Adopted: Yes  Family history unknown: Yes    ALLERGIES:  is allergic to acarbose; demerol [meperidine hcl]; meperidine; and pioglitazone.  MEDICATIONS:  Current Outpatient Medications  Medication Sig Dispense Refill  . atorvastatin (LIPITOR) 40 MG tablet Take 1 tablet (40 mg total) by mouth daily. 90 tablet 1  . azaCITIDine in lactated ringers infusion Inject 100 mg/m2 into the vein daily.    . furosemide (LASIX) 20 MG tablet TAKE 1 TABLET BY MOUTH ONCE DAILY 30 tablet 3  . glimepiride (AMARYL) 4 MG tablet Take 1 tablet (4 mg total) by mouth daily. 90 tablet 1  . Lancets (ONETOUCH ULTRASOFT) lancets Use as instructed 100 each 12  . levothyroxine (SYNTHROID, LEVOTHROID) 50 MCG tablet Take 1 tablet (50 mcg total) by mouth daily before breakfast. 90 tablet 1  . NOVOLOG MIX 70/30 FLEXPEN (70-30) 100 UNIT/ML FlexPen Inject 0.1-0.15 mLs (10-15 Units total) into the skin 2 (two)  times daily with a meal. (Patient taking differently: Inject 8 Units into the skin 2 (two) times daily with a meal. ) 21 mL 1  . nystatin (MYCOSTATIN/NYSTOP) powder nystatin 100,000 unit/gram topical powder   1 app by topical route.    . ondansetron (ZOFRAN) 8 MG tablet Take 1 tablet (8 mg total) by mouth 2 (two) times daily as needed (Nausea or vomiting). 30 tablet 1  . ONE TOUCH ULTRA TEST test strip TEST TWICE DAILY 200 each 1  . pantoprazole (PROTONIX) 40 MG tablet Take 1 tablet (40 mg total) by mouth daily. 90 tablet 1  . quinapril (ACCUPRIL) 20 MG tablet Take 1 tablet (20 mg total) by mouth daily. 90 tablet 1  . sulfaSALAzine (AZULFIDINE) 500 MG EC tablet Take 1 tablet (500 mg total) by mouth at bedtime. 90 tablet 1  . ULTICARE MICRO PEN NEEDLES 32G X 4 MM MISC Inject 1 each into the skin 2 (two) times daily. 100 each 12  . vitamin B-12  (CYANOCOBALAMIN) 1000 MCG tablet Take 1 tablet (1,000 mcg total) by mouth daily. 90 tablet 1   No current facility-administered medications for this visit.      PHYSICAL EXAMINATION: ECOG PERFORMANCE STATUS: 1 - Symptomatic but completely ambulatory Vitals:   07/15/18 1306  BP: (!) 147/67  Pulse: 64  Resp: 18  Temp: (!) 97.1 F (36.2 C)   Filed Weights   07/15/18 1306  Weight: 160 lb 9.6 oz (72.8 kg)    Physical Exam Constitutional:      General: She is not in acute distress. HENT:     Head: Normocephalic and atraumatic.  Eyes:     General: No scleral icterus.    Conjunctiva/sclera: Conjunctivae normal.     Pupils: Pupils are equal, round, and reactive to light.  Neck:     Musculoskeletal: Normal range of motion and neck supple.  Cardiovascular:     Rate and Rhythm: Normal rate and regular rhythm.     Heart sounds: Normal heart sounds.  Pulmonary:     Effort: Pulmonary effort is normal. No respiratory distress.     Breath sounds: Normal breath sounds. No wheezing or rales.  Chest:     Chest wall: No tenderness.  Abdominal:     General: Bowel sounds are normal. There is no distension.     Palpations: Abdomen is soft. There is no mass.     Tenderness: There is no abdominal tenderness.  Musculoskeletal: Normal range of motion.        General: No deformity.  Lymphadenopathy:     Cervical: No cervical adenopathy.  Skin:    General: Skin is warm and dry.     Findings: No erythema or rash.  Neurological:     Mental Status: She is alert and oriented to person, place, and time.     Cranial Nerves: No cranial nerve deficit.     Coordination: Coordination normal.  Psychiatric:        Behavior: Behavior normal.        Thought Content: Thought content normal.      LABORATORY DATA:  I have reviewed the data as listed Lab Results  Component Value Date   WBC 4.2 07/15/2018   HGB 10.8 (L) 07/15/2018   HCT 34.0 (L) 07/15/2018   MCV 99.7 07/15/2018   PLT 122 (L)  07/15/2018   Recent Labs    06/10/18 1326 06/24/18 1322 07/15/18 1222  NA 138 138 141  K 4.3 4.1 4.0  CL 106  107 109  CO2 _0 GLUCOSE 139* 92 122*  BUN _1 CREATININE 0.99 0.95 0.96  CALCIUM 9.1 8.8* 8.8*  GFRNONAA 54* 57* 56*  GFRAA >60 >60 >60  PROT 6.4* 6.5 6.8  ALBUMIN 3.5 3.7 3.8  AST _2 ALT _3 ALKPHOS 111 114 128*  BILITOT 0.5 0.6 0.7       ASSESSMENT & PLAN:  1. Thrombocytopenia ([place])   2. MDS (myelodysplastic syndrome) with 5q deletion ([place])   3. Encounter for antineoplastic chemotherapy   4. B12 deficiency     # MDS: S/p 7 cycles of azacitidine.  Tolerates well. Labs reviewed and discussed with patient.  Patient has good hematological response with improvement of platelet counts. Counts acceptable to proceed with cycle 8 azacitidine day 1-5.  #Thrombocytopenia, platelet count stable.  Continue to monitor. #Chemotherapy induced neutropenia, resolved. #Anemia, hemoglobin stable.  Continue to monitor. #Vitamin B12 deficiency, status post daily vitamin B12 injections x5 followed by weekly B12 injections x 4 Check anti-parietal antibody and intrinsic factor antibody. I sent a prescription of oral vitamin B12 supplements. Repeat B12 level at next visit. We spent sufficient time to discuss many aspect of care, questions were answered to patient's satisfaction. . The patient knows to call the clinic with any problems questions or concerns. Follow-up in 4 weeks for next cycle of treatment. Check cbc, cmp, B12, anti parietal antibody and intrinsic factor antibody Total face to face encounter time for this patient visit was 25 min. >50% of the time was  spent in counseling and coordination of care.   Earlie Server, MD, PhD Hematology Oncology [facility] at [facility] Pager- [phone] 07/15/2018

## 2018-07-16 ENCOUNTER — Inpatient Hospital Stay: Payer: Medicare Other

## 2018-07-16 VITALS — BP 115/43 | HR 66 | Temp 96.3°F | Resp 18

## 2018-07-16 DIAGNOSIS — E538 Deficiency of other specified B group vitamins: Secondary | ICD-10-CM | POA: Diagnosis not present

## 2018-07-16 DIAGNOSIS — R5382 Chronic fatigue, unspecified: Secondary | ICD-10-CM | POA: Diagnosis not present

## 2018-07-16 DIAGNOSIS — D696 Thrombocytopenia, unspecified: Secondary | ICD-10-CM | POA: Diagnosis not present

## 2018-07-16 DIAGNOSIS — R040 Epistaxis: Secondary | ICD-10-CM | POA: Diagnosis not present

## 2018-07-16 DIAGNOSIS — D46C Myelodysplastic syndrome with isolated del(5q) chromosomal abnormality: Secondary | ICD-10-CM | POA: Diagnosis not present

## 2018-07-16 DIAGNOSIS — Z5111 Encounter for antineoplastic chemotherapy: Secondary | ICD-10-CM | POA: Diagnosis not present

## 2018-07-16 MED ORDER — AZACITIDINE CHEMO SQ INJECTION
75.0000 mg/m2 | Freq: Once | INTRAMUSCULAR | Status: AC
  Administered 2018-07-16: 132.5 mg via SUBCUTANEOUS
  Filled 2018-07-16: qty 5.3

## 2018-07-16 MED ORDER — ONDANSETRON HCL 4 MG PO TABS: 8.0000 mg | ORAL_TABLET | Freq: Once | ORAL | Status: DC

## 2018-07-17 ENCOUNTER — Inpatient Hospital Stay: Payer: Medicare Other

## 2018-07-17 VITALS — BP 126/54 | HR 85 | Temp 96.3°F | Resp 18

## 2018-07-17 DIAGNOSIS — E538 Deficiency of other specified B group vitamins: Secondary | ICD-10-CM

## 2018-07-17 DIAGNOSIS — D46C Myelodysplastic syndrome with isolated del(5q) chromosomal abnormality: Secondary | ICD-10-CM | POA: Diagnosis not present

## 2018-07-17 DIAGNOSIS — R040 Epistaxis: Secondary | ICD-10-CM | POA: Diagnosis not present

## 2018-07-17 DIAGNOSIS — D696 Thrombocytopenia, unspecified: Secondary | ICD-10-CM | POA: Diagnosis not present

## 2018-07-17 DIAGNOSIS — Z5111 Encounter for antineoplastic chemotherapy: Secondary | ICD-10-CM | POA: Diagnosis not present

## 2018-07-17 DIAGNOSIS — R5382 Chronic fatigue, unspecified: Secondary | ICD-10-CM | POA: Diagnosis not present

## 2018-07-17 MED ORDER — AZACITIDINE CHEMO SQ INJECTION
75.0000 mg/m2 | Freq: Once | INTRAMUSCULAR | Status: AC
  Administered 2018-07-17: 132.5 mg via SUBCUTANEOUS
  Filled 2018-07-17: qty 5.3

## 2018-07-17 MED ORDER — ONDANSETRON HCL 4 MG PO TABS: 8.0000 mg | ORAL_TABLET | Freq: Once | ORAL | Status: DC

## 2018-07-17 NOTE — Progress Notes (Signed)
Pt reports loose diarrhea x4 on Monday 07/15/2018 after treatment, x2 Tuesday 07/16/2018 after treatment and x2 today 07/17/2018 prior to reporting to clinic for treatment, MD aware. Per Almyra Free CMA per Dr. Tasia Catchings pt recommended to take imodium as needed, and okay to proceed with treatment. Pt aware and verbalizes understanding.

## 2018-07-18 ENCOUNTER — Inpatient Hospital Stay: Payer: Medicare Other

## 2018-07-18 VITALS — BP 137/52 | HR 72 | Resp 18

## 2018-07-18 DIAGNOSIS — E538 Deficiency of other specified B group vitamins: Secondary | ICD-10-CM | POA: Diagnosis not present

## 2018-07-18 DIAGNOSIS — R040 Epistaxis: Secondary | ICD-10-CM | POA: Diagnosis not present

## 2018-07-18 DIAGNOSIS — R5382 Chronic fatigue, unspecified: Secondary | ICD-10-CM | POA: Diagnosis not present

## 2018-07-18 DIAGNOSIS — Z5111 Encounter for antineoplastic chemotherapy: Secondary | ICD-10-CM | POA: Diagnosis not present

## 2018-07-18 DIAGNOSIS — D696 Thrombocytopenia, unspecified: Secondary | ICD-10-CM | POA: Diagnosis not present

## 2018-07-18 DIAGNOSIS — D46C Myelodysplastic syndrome with isolated del(5q) chromosomal abnormality: Secondary | ICD-10-CM | POA: Diagnosis not present

## 2018-07-18 MED ORDER — AZACITIDINE CHEMO SQ INJECTION
75.0000 mg/m2 | Freq: Once | INTRAMUSCULAR | Status: AC
  Administered 2018-07-18: 132.5 mg via SUBCUTANEOUS
  Filled 2018-07-18: qty 5.3

## 2018-07-19 ENCOUNTER — Ambulatory Visit (INDEPENDENT_AMBULATORY_CARE_PROVIDER_SITE_OTHER): Payer: Medicare Other | Admitting: Internal Medicine

## 2018-07-19 ENCOUNTER — Inpatient Hospital Stay: Payer: Medicare Other

## 2018-07-19 ENCOUNTER — Encounter: Payer: Self-pay | Admitting: Internal Medicine

## 2018-07-19 VITALS — BP 134/68 | HR 79 | Ht 64.0 in | Wt 159.2 lb

## 2018-07-19 VITALS — BP 166/75 | HR 74 | Temp 98.3°F | Resp 20

## 2018-07-19 DIAGNOSIS — K219 Gastro-esophageal reflux disease without esophagitis: Secondary | ICD-10-CM

## 2018-07-19 DIAGNOSIS — Z5111 Encounter for antineoplastic chemotherapy: Secondary | ICD-10-CM | POA: Diagnosis not present

## 2018-07-19 DIAGNOSIS — Z794 Long term (current) use of insulin: Secondary | ICD-10-CM | POA: Diagnosis not present

## 2018-07-19 DIAGNOSIS — I10 Essential (primary) hypertension: Secondary | ICD-10-CM

## 2018-07-19 DIAGNOSIS — E785 Hyperlipidemia, unspecified: Secondary | ICD-10-CM | POA: Diagnosis not present

## 2018-07-19 DIAGNOSIS — E1169 Type 2 diabetes mellitus with other specified complication: Secondary | ICD-10-CM

## 2018-07-19 DIAGNOSIS — E538 Deficiency of other specified B group vitamins: Secondary | ICD-10-CM

## 2018-07-19 DIAGNOSIS — E119 Type 2 diabetes mellitus without complications: Secondary | ICD-10-CM | POA: Diagnosis not present

## 2018-07-19 DIAGNOSIS — E034 Atrophy of thyroid (acquired): Secondary | ICD-10-CM | POA: Diagnosis not present

## 2018-07-19 DIAGNOSIS — D46C Myelodysplastic syndrome with isolated del(5q) chromosomal abnormality: Secondary | ICD-10-CM

## 2018-07-19 DIAGNOSIS — R040 Epistaxis: Secondary | ICD-10-CM | POA: Diagnosis not present

## 2018-07-19 DIAGNOSIS — R5382 Chronic fatigue, unspecified: Secondary | ICD-10-CM | POA: Diagnosis not present

## 2018-07-19 DIAGNOSIS — D696 Thrombocytopenia, unspecified: Secondary | ICD-10-CM | POA: Diagnosis not present

## 2018-07-19 MED ORDER — PANTOPRAZOLE SODIUM 40 MG PO TBEC: 40.0000 mg | DELAYED_RELEASE_TABLET | Freq: Every day | ORAL | 1 refills | Status: DC

## 2018-07-19 MED ORDER — AZACITIDINE CHEMO SQ INJECTION
75.0000 mg/m2 | Freq: Once | INTRAMUSCULAR | Status: AC
  Administered 2018-07-19: 132.5 mg via SUBCUTANEOUS
  Filled 2018-07-19: qty 5.3

## 2018-07-19 MED ORDER — ATORVASTATIN CALCIUM 20 MG PO TABS: 20.0000 mg | ORAL_TABLET | Freq: Every day | ORAL | 1 refills | Status: DC

## 2018-07-19 MED ORDER — ONDANSETRON HCL 4 MG PO TABS: 8.0000 mg | ORAL_TABLET | Freq: Once | ORAL | Status: DC

## 2018-07-19 NOTE — Progress Notes (Signed)
Date:  07/19/2018   Name:  Yesenia Hart   DOB:  [birth year]   MRN:  [id number]   Chief Complaint: Diabetes (4 month follow up.); Hypertension; Hypothyroidism; and Gastroesophageal Reflux (Refill for Pantoprazole. )  Diabetes  She presents for her follow-up diabetic visit. She has type 2 diabetes mellitus. Her disease course has been stable. Pertinent negatives for hypoglycemia include no headaches or tremors. Pertinent negatives for diabetes include no chest pain, no fatigue, no polydipsia and no polyuria. There are no hypoglycemic complications. Symptoms are stable. Current diabetic treatment includes insulin injections and oral agent (monotherapy). She is compliant with treatment all of the time. She monitors blood glucose at home 1-2 x per day. There is no change in her home blood glucose trend. Her breakfast blood glucose is taken between 7-8 am. Her breakfast blood glucose range is generally 90-110 mg/dl. An ACE inhibitor/angiotensin II receptor blocker is being taken. Eye exam is current.  Hypertension  Pertinent negatives include no chest pain, headaches, palpitations or shortness of breath. Identifiable causes of hypertension include a thyroid problem.  Hyperlipidemia  Pertinent negatives include no chest pain or shortness of breath.  Thyroid Problem  Symptoms include diarrhea. Patient reports no fatigue, palpitations or tremors. Her past medical history is significant for hyperlipidemia.  Gastroesophageal Reflux  She reports no abdominal pain, no chest pain or no coughing. This is a recurrent problem. The problem occurs occasionally. Pertinent negatives include no fatigue. She has tried a PPI for the symptoms.   Lab Results  Component Value Date   HGBA1C 5.1 03/15/2018    Review of Systems  Constitutional: Negative for appetite change, fatigue, fever and unexpected weight change.  HENT: Negative for tinnitus and trouble swallowing.   Eyes: Negative for visual disturbance.    Respiratory: Negative for cough, chest tightness and shortness of breath.   Cardiovascular: Negative for chest pain, palpitations and leg swelling.  Gastrointestinal: Positive for anal bleeding and diarrhea. Negative for abdominal pain and blood in stool.  Endocrine: Negative for polydipsia and polyuria.  Genitourinary: Negative for dysuria and hematuria.  Musculoskeletal: Positive for back pain. Negative for arthralgias and joint swelling.  Neurological: Negative for tremors, numbness and headaches.  Psychiatric/Behavioral: Negative for dysphoric mood and sleep disturbance.    Patient Active Problem List   Diagnosis Date Noted  . B12 deficiency 06/10/2018  . 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 CAD (coronary artery disease), native coronary artery 03/15/2018  . Drug-induced neutropenia ([place]) 01/14/2018  . Type 2 diabetes mellitus without complication, with long-term current use of insulin ([place]) 12/17/2017  . Fever blister 12/17/2017  . Tobacco use disorder, moderate, in sustained remission 12/17/2017  . Thrombocytopenia ([place]) 12/12/2017  . Goals of care, counseling/discussion 12/12/2017  . Other fatigue 12/12/2017  . Asthma 11/30/2017  . Cataract 11/30/2017  . Chronic obstructive lung disease ([place]) 11/30/2017  . 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 Edema 11/30/2017  . Essential hypertension 11/30/2017  . Hyperlipidemia associated with type 2 diabetes mellitus ([place]) 11/30/2017  . Osteoarthritis 11/30/2017  . 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 Acarbose Diarrhea  . Demerol [Meperidine Hcl]   . Meperidine Nausea And Vomiting  . Pioglitazone     Other reaction(s): Edema    Past Surgical History:  Procedure Laterality Date  . APPENDECTOMY    . breast biopsy 60'    . CHOLECYSTECTOMY    .  CORONARY ANGIOPLASTY WITH STENT PLACEMENT  2016    in [place] History   Tobacco Use  . Smoking status: Former Smoker    Packs/day: 1.00    Types: Cigarettes    Last attempt to quit: 1999    Years since quitting: 21.0  . Smokeless tobacco: Never Used  Substance Use Topics  . Alcohol use: Not Currently  . Drug use: Never     Medication list has been reviewed and updated.  Current Meds  Medication Sig  . atorvastatin (LIPITOR) 40 MG tablet Take 1 tablet (40 mg total) by mouth daily.  Marland Kitchen azaCITIDine in lactated ringers infusion Inject 100 mg/m2 into the vein daily.  . furosemide (LASIX) 20 MG tablet TAKE 1 TABLET BY MOUTH ONCE DAILY  . glimepiride (AMARYL) 4 MG tablet Take 1 tablet (4 mg total) by mouth daily.  . Lancets (ONETOUCH ULTRASOFT) lancets Use as instructed  . levothyroxine (SYNTHROID, LEVOTHROID) 50 MCG tablet Take 1 tablet (50 mcg total) by mouth daily before breakfast.  . NOVOLOG MIX 70/30 FLEXPEN (70-30) 100 UNIT/ML FlexPen Inject 0.1-0.15 mLs (10-15 Units total) into the skin 2 (two) times daily with a meal. (Patient taking differently: Inject 8 Units into the skin 2 (two) times daily with a meal. )  . nystatin (MYCOSTATIN/NYSTOP) powder nystatin 100,000 unit/gram topical powder   1 app by topical route.  . ondansetron (ZOFRAN) 8 MG tablet Take 1 tablet (8 mg total) by mouth 2 (two) times daily as needed (Nausea or vomiting).  . ONE TOUCH ULTRA TEST test strip TEST TWICE DAILY  . pantoprazole (PROTONIX) 40 MG tablet Take 1 tablet (40 mg total) by mouth daily.  . quinapril (ACCUPRIL) 20 MG tablet Take 1 tablet (20 mg total) by mouth daily.  Marland Kitchen sulfaSALAzine (AZULFIDINE) 500 MG EC tablet Take 1 tablet (500 mg total) by mouth at bedtime.  Marland Kitchen ULTICARE MICRO PEN NEEDLES 32G X 4 MM MISC Inject 1 each into the skin 2 (two) times daily.    PHQ 2/9 Scores 07/19/2018 12/17/2017  PHQ - 2 Score 0 0    Physical Exam Vitals signs and nursing note reviewed.  Constitutional:      General: She is not in acute distress.     Appearance: She is well-developed.  HENT:     Head: Normocephalic and atraumatic.  Eyes:     Pupils: Pupils are equal, round, and reactive to light.  Neck:     Musculoskeletal: Normal range of motion and neck supple.     Vascular: No carotid bruit.  Cardiovascular:     Rate and Rhythm: Normal rate and regular rhythm.     Pulses: Normal pulses.     Heart sounds: Normal heart sounds.  Pulmonary:     Effort: Pulmonary effort is normal. No respiratory distress.  Musculoskeletal: Normal range of motion.  Skin:    General: Skin is warm and dry.     Findings: No rash.  Neurological:     Mental Status: She is alert and oriented to person, place, and time.  Psychiatric:        Behavior: Behavior normal.        Thought Content: Thought content normal.    Wt Readings from Last 3 Encounters:  07/19/18 159 lb 3.2 oz (72.2 kg)  07/15/18 160 lb 9.6 oz (72.8 kg)  06/10/18 158 lb (71.7 kg)    BP 134/68 (BP Location: Right Arm, Patient Position: Sitting, Cuff Size: Normal)   Pulse 79  Ht 5' 4"  (1.626 m)   Wt 159 lb 3.2 oz (72.2 kg)   SpO2 95%   BMI 27.33 kg/m   Assessment and Plan: 1. Type 2 diabetes mellitus without complication, with long-term current use of insulin (HCC) Continue current medications, diet - Hemoglobin A1c; Future  2. Essential hypertension controlled  3. Hypothyroidism due to acquired atrophy of thyroid supplemented  4. Hyperlipidemia associated with type 2 diabetes mellitus (HCC) Reduce dose of atorvasttin - atorvastatin (LIPITOR) 20 MG tablet; Take 1 tablet (20 mg total) by mouth daily.  Dispense: 90 tablet; Refill: 1  5. Gastroesophageal reflux disease, esophagitis presence not specified Controlled on PPI - pantoprazole (PROTONIX) 40 MG tablet; Take 1 tablet (40 mg total) by mouth daily.  Dispense: 90 tablet; Refill: 1   Partially dictated using Editor, commissioning. Any errors are unintentional.  Halina Maidens, MD [facility]  07/19/2018

## 2018-07-25 ENCOUNTER — Other Ambulatory Visit: Payer: Self-pay | Admitting: Oncology

## 2018-07-25 ENCOUNTER — Encounter: Payer: Self-pay | Admitting: Oncology

## 2018-07-25 ENCOUNTER — Other Ambulatory Visit: Payer: Self-pay | Admitting: Internal Medicine

## 2018-07-25 DIAGNOSIS — D46C Myelodysplastic syndrome with isolated del(5q) chromosomal abnormality: Secondary | ICD-10-CM

## 2018-08-02 ENCOUNTER — Ambulatory Visit (INDEPENDENT_AMBULATORY_CARE_PROVIDER_SITE_OTHER): Payer: Medicare Other

## 2018-08-02 DIAGNOSIS — Z23 Encounter for immunization: Secondary | ICD-10-CM | POA: Diagnosis not present

## 2018-08-12 ENCOUNTER — Other Ambulatory Visit: Payer: Self-pay

## 2018-08-12 ENCOUNTER — Inpatient Hospital Stay (HOSPITAL_BASED_OUTPATIENT_CLINIC_OR_DEPARTMENT_OTHER): Payer: Medicare Other | Admitting: Oncology

## 2018-08-12 ENCOUNTER — Encounter: Payer: Self-pay | Admitting: Oncology

## 2018-08-12 ENCOUNTER — Inpatient Hospital Stay: Payer: Medicare Other | Attending: Oncology | Admitting: *Deleted

## 2018-08-12 ENCOUNTER — Inpatient Hospital Stay: Payer: Medicare Other

## 2018-08-12 VITALS — BP 137/66 | HR 65 | Temp 97.1°F | Wt 161.3 lb

## 2018-08-12 DIAGNOSIS — E119 Type 2 diabetes mellitus without complications: Secondary | ICD-10-CM | POA: Diagnosis not present

## 2018-08-12 DIAGNOSIS — Z87891 Personal history of nicotine dependence: Secondary | ICD-10-CM | POA: Insufficient documentation

## 2018-08-12 DIAGNOSIS — E538 Deficiency of other specified B group vitamins: Secondary | ICD-10-CM

## 2018-08-12 DIAGNOSIS — D619 Aplastic anemia, unspecified: Secondary | ICD-10-CM | POA: Diagnosis not present

## 2018-08-12 DIAGNOSIS — T451X5S Adverse effect of antineoplastic and immunosuppressive drugs, sequela: Secondary | ICD-10-CM | POA: Insufficient documentation

## 2018-08-12 DIAGNOSIS — D701 Agranulocytosis secondary to cancer chemotherapy: Secondary | ICD-10-CM | POA: Insufficient documentation

## 2018-08-12 DIAGNOSIS — D46C Myelodysplastic syndrome with isolated del(5q) chromosomal abnormality: Secondary | ICD-10-CM

## 2018-08-12 DIAGNOSIS — D696 Thrombocytopenia, unspecified: Secondary | ICD-10-CM | POA: Diagnosis not present

## 2018-08-12 DIAGNOSIS — D6189 Other specified aplastic anemias and other bone marrow failure syndromes: Secondary | ICD-10-CM

## 2018-08-12 DIAGNOSIS — Z7984 Long term (current) use of oral hypoglycemic drugs: Secondary | ICD-10-CM | POA: Insufficient documentation

## 2018-08-12 DIAGNOSIS — Z5111 Encounter for antineoplastic chemotherapy: Secondary | ICD-10-CM

## 2018-08-12 DIAGNOSIS — R5383 Other fatigue: Secondary | ICD-10-CM | POA: Insufficient documentation

## 2018-08-12 DIAGNOSIS — K625 Hemorrhage of anus and rectum: Secondary | ICD-10-CM | POA: Diagnosis not present

## 2018-08-12 DIAGNOSIS — Z79899 Other long term (current) drug therapy: Secondary | ICD-10-CM | POA: Insufficient documentation

## 2018-08-12 DIAGNOSIS — Z794 Long term (current) use of insulin: Secondary | ICD-10-CM

## 2018-08-12 LAB — CBC WITH DIFFERENTIAL/PLATELET
Abs Immature Granulocytes: 0 10*3/uL (ref 0.00–0.07)
Basophils Absolute: 0.1 10*3/uL (ref 0.0–0.1)
Basophils Relative: 2 %
Eosinophils Absolute: 0.1 10*3/uL (ref 0.0–0.5)
Eosinophils Relative: 2 %
HCT: 30 % — ABNORMAL LOW (ref 36.0–46.0)
Hemoglobin: 9.4 g/dL — ABNORMAL LOW (ref 12.0–15.0)
Immature Granulocytes: 0 %
Lymphocytes Relative: 41 %
Lymphs Abs: 1.1 10*3/uL (ref 0.7–4.0)
MCH: 31.6 pg (ref 26.0–34.0)
MCHC: 31.3 g/dL (ref 30.0–36.0)
MCV: 101 fL — ABNORMAL HIGH (ref 80.0–100.0)
MONO ABS: 0.2 10*3/uL (ref 0.1–1.0)
Monocytes Relative: 6 %
Neutro Abs: 1.3 10*3/uL — ABNORMAL LOW (ref 1.7–7.7)
Neutrophils Relative %: 49 %
Platelets: 148 10*3/uL — ABNORMAL LOW (ref 150–400)
RBC: 2.97 MIL/uL — AB (ref 3.87–5.11)
RDW: 17.5 % — ABNORMAL HIGH (ref 11.5–15.5)
WBC: 2.6 10*3/uL — ABNORMAL LOW (ref 4.0–10.5)
nRBC: 0 % (ref 0.0–0.2)

## 2018-08-12 LAB — COMPREHENSIVE METABOLIC PANEL
ALT: 14 U/L (ref 0–44)
AST: 19 U/L (ref 15–41)
Albumin: 3.4 g/dL — ABNORMAL LOW (ref 3.5–5.0)
Alkaline Phosphatase: 119 U/L (ref 38–126)
Anion gap: 5 (ref 5–15)
BUN: 18 mg/dL (ref 8–23)
CO2: 22 mmol/L (ref 22–32)
Calcium: 8.2 mg/dL — ABNORMAL LOW (ref 8.9–10.3)
Chloride: 110 mmol/L (ref 98–111)
Creatinine, Ser: 1.09 mg/dL — ABNORMAL HIGH (ref 0.44–1.00)
GFR calc Af Amer: 56 mL/min — ABNORMAL LOW (ref >60)
GFR, EST NON AFRICAN AMERICAN: 48 mL/min — AB (ref >60)
Glucose, Bld: 209 mg/dL — ABNORMAL HIGH (ref 70–99)
Potassium: 4.3 mmol/L (ref 3.5–5.1)
Sodium: 137 mmol/L (ref 135–145)
TOTAL PROTEIN: 6.3 g/dL — AB (ref 6.5–8.1)
Total Bilirubin: 0.4 mg/dL (ref 0.3–1.2)

## 2018-08-12 LAB — HEMOGLOBIN A1C
Hgb A1c MFr Bld: 6.2 % — ABNORMAL HIGH (ref 4.8–5.6)
Mean Plasma Glucose: 131.24 mg/dL

## 2018-08-12 LAB — VITAMIN B12: Vitamin B-12: 790 pg/mL (ref 180–914)

## 2018-08-12 MED ORDER — AZACITIDINE CHEMO SQ INJECTION
75.0000 mg/m2 | Freq: Once | INTRAMUSCULAR | Status: AC
  Administered 2018-08-12: 132.5 mg via SUBCUTANEOUS
  Filled 2018-08-12: qty 5.3

## 2018-08-12 MED ORDER — ONDANSETRON HCL 4 MG PO TABS: 8.0000 mg | ORAL_TABLET | Freq: Once | ORAL | Status: DC

## 2018-08-12 NOTE — Progress Notes (Signed)
Hematology/Oncology Follow up note [facility] Telephone:(336) [phone] Fax:(336) [phone]   Patient Care Team: Glean Hess, MD as PCP - General (Internal Medicine)  REFERRING PROVIDER: Glean Hess, MD  Previous Oncologist Dr.Srujitha Murkutla REASON FOR VISIT Follow up for treatment of MDS  HISTORY OF PRESENTING ILLNESS:  Yesenia Hart is a  78 y.o.  female who recently moved from [place] to [place].  She used to live at [place] with husband.  Accompanied by daughter, [name] who is a respiratory therapist at [employer].  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  Patient presented to emergency room in January 2019 with complaints of intermittent rectal bleeding and increased to have a hemoglobin of 3 and platelet counts was 59,000.  Received 5 units of PRBC and was discharged home with hemoglobin of 10.  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  Cauterization was deferred due to thrombocytopenia. Aspirin was discontinued due to GI bleeding. Peripheral blood flow, SPEP negative, no obvious signs of B12 deficiency.  Ultrasound of the abdomen showed mild fatty liver.  On October 04, 2017, patient underwent bone marrow biopsy. Bone marrow core biopsy, bone marrow aspirate clot, bone marrow aspirate smears showed  single linage dysplasia.  Less than 5% bone marrow blasts and no circulating blast. 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  20 metaphases, lites were normal and 18 metaphases 5 q. deletion, it has translocation involving  chromosome of 3, 17, 16, and iso-di centric 22.  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  Patient received IV iron infusion on moving to [place].  She has not had any treatment for MDS done due to the move.  Today patient is accompanied by her daughter to the clinic.  She reports feeling tired, no weight loss.  She reports easy bruising.  Intermittent epistaxis which spontaneously resolved after applying pressure denies any hematochezia, hematemesis, hemoptysis.  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 Although patient does have 5q deletion which if isolated, is a good cytogentic group, she carried other cytogentic abnormalities which increases her IPSS-R score.   INTERVAL HISTORY Yesenia Hart is a 78 y.o. female who has above history reviewed by me today presents for assessment prior to MDS treatment with azacitidine.   #She reports feeling well except slightly more tired recently. Tolerates treatment so far.  Denies any nausea, vomiting or diarrhea. No new complaints today. Review of Systems  Constitutional: Positive for fatigue. Negative for appetite change, chills and fever.  HENT:   Negative for hearing loss and voice change.   Eyes: Negative for eye problems.  Respiratory: Negative for chest tightness and cough.   Cardiovascular: Negative for chest pain.  Gastrointestinal: Negative for abdominal distention, abdominal pain and blood in stool.  Endocrine: Negative for hot flashes.  Genitourinary: Negative for difficulty urinating and frequency.   Musculoskeletal: Negative for arthralgias.  Skin: Negative for itching and rash.  Neurological: Negative for extremity weakness.  Hematological: Negative for adenopathy.  Psychiatric/Behavioral: Negative for confusion.    MEDICAL HISTORY:  Past Medical History:  Diagnosis Date  . Anemia   .  B12 deficiency 06/10/2018  . Clotting  disorder ([place])   . Diabetes mellitus without complication ([place])   . IBS (irritable bowel syndrome)   . Iron deficiency anemia due to chronic blood loss 12/12/2017  . MDS (myelodysplastic syndrome) ([place])   . MDS (myelodysplastic syndrome) ([place])   . Thyroid disease     SURGICAL HISTORY: Past Surgical History:  Procedure Laterality Date  . APPENDECTOMY    . breast biopsy 61'    . CHOLECYSTECTOMY    . CORONARY ANGIOPLASTY WITH STENT PLACEMENT  2016   in [place]: Social History   Socioeconomic History  . Marital status: Married    Spouse name: Not on file  . Number of children: Not on file  . Years of education: Not on file  . Highest education level: Not on file  Occupational History  . Occupation: retired  Scientific laboratory technician  . Financial resource strain: Not on file  . Food insecurity:    Worry: Not on file    Inability: Not on file  . Transportation needs:    Medical: Not on file    Non-medical: Not on file  Tobacco Use  . Smoking status: Former Smoker    Packs/day: 1.00    Types: Cigarettes    Last attempt to quit: 1999    Years since quitting: 21.1  . Smokeless tobacco: Never Used  Substance and Sexual Activity  . Alcohol use: Not Currently  . Drug use: Never  . Sexual activity: Yes  Lifestyle  . Physical activity:    Days per week: Not on file    Minutes per session: Not on file  . Stress: Not on file  Relationships  . Social connections:    Talks on phone: Not on file    Gets together: Not on file    Attends religious service: Not on file    Active member of club or organization: Not on file    Attends meetings of clubs or organizations: Not on file    Relationship status: Not on file  . Intimate partner violence:    Fear of current or ex partner: Not on file    Emotionally abused: Not on file    Physically abused: Not on file    Forced sexual activity: Not on file  Other Topics Concern  . Not on file  Social History Narrative  . Not  on file    FAMILY HISTORY: Family History  Adopted: Yes  Family history unknown: Yes    ALLERGIES:  is allergic to acarbose; demerol [meperidine hcl]; meperidine; and pioglitazone.  MEDICATIONS:  Current Outpatient Medications  Medication Sig Dispense Refill  . atorvastatin (LIPITOR) 20 MG tablet Take 1 tablet (20 mg total) by mouth daily. 90 tablet 1  . azaCITIDine in lactated ringers infusion Inject 100 mg/m2 into the vein daily.    . furosemide (LASIX) 20 MG tablet TAKE 1 TABLET BY MOUTH ONCE DAILY 30 tablet 3  . glimepiride (AMARYL) 4 MG tablet Take 1 tablet (4 mg total) by mouth daily. 90 tablet 1  . Lancets (ONETOUCH ULTRASOFT) lancets Use as instructed 100 each 12  . levothyroxine (SYNTHROID, LEVOTHROID) 50 MCG tablet TAKE 1 TABLET BY MOUTH ONCE DAILY ON AN EMPTY STOMACH. WAIT 30 MINUTES BEFORE TAKING OTHER MEDS. 90 tablet 1  . NOVOLOG MIX 70/30 FLEXPEN (70-30) 100 UNIT/ML FlexPen Inject 0.1-0.15 mLs (10-15 Units total) into the skin 2 (two) times daily with a meal. (Patient taking differently: Inject 8 Units  into the skin 2 (two) times daily with a meal. ) 21 mL 1  . nystatin (MYCOSTATIN/NYSTOP) powder nystatin 100,000 unit/gram topical powder   1 app by topical route.    . ondansetron (ZOFRAN) 8 MG tablet TAKE 1 TABLET BY MOUTH TWICE DAILY AS NEEDED NAUSEA AND VOMITING 30 tablet 1  . ONE TOUCH ULTRA TEST test strip TEST TWICE DAILY 200 each 1  . pantoprazole (PROTONIX) 40 MG tablet Take 1 tablet (40 mg total) by mouth daily. 90 tablet 1  . quinapril (ACCUPRIL) 20 MG tablet Take 1 tablet (20 mg total) by mouth daily. 90 tablet 1  . sulfaSALAzine (AZULFIDINE) 500 MG EC tablet Take 1 tablet (500 mg total) by mouth at bedtime. 90 tablet 1  . ULTICARE MICRO PEN NEEDLES 32G X 4 MM MISC Inject 1 each into the skin 2 (two) times daily. 100 each 12   No current facility-administered medications for this visit.      PHYSICAL EXAMINATION: ECOG PERFORMANCE STATUS: 1 - Symptomatic but  completely ambulatory Vitals:   08/12/18 1311  BP: 137/66  Pulse: 65  Temp: (!) 97.1 F (36.2 C)   Filed Weights   08/12/18 1311  Weight: 161 lb 5 oz (73.2 kg)    Physical Exam Constitutional:      General: She is not in acute distress. HENT:     Head: Normocephalic and atraumatic.  Eyes:     General: No scleral icterus.    Conjunctiva/sclera: Conjunctivae normal.     Pupils: Pupils are equal, round, and reactive to light.  Neck:     Musculoskeletal: Normal range of motion and neck supple.  Cardiovascular:     Rate and Rhythm: Normal rate and regular rhythm.     Heart sounds: Normal heart sounds.  Pulmonary:     Effort: Pulmonary effort is normal. No respiratory distress.     Breath sounds: Normal breath sounds. No wheezing or rales.  Chest:     Chest wall: No tenderness.  Abdominal:     General: Bowel sounds are normal. There is no distension.     Palpations: Abdomen is soft. There is no mass.     Tenderness: There is no abdominal tenderness.  Musculoskeletal: Normal range of motion.        General: No deformity.  Lymphadenopathy:     Cervical: No cervical adenopathy.  Skin:    General: Skin is warm and dry.     Findings: No erythema or rash.  Neurological:     Mental Status: She is alert and oriented to person, place, and time.     Cranial Nerves: No cranial nerve deficit.     Coordination: Coordination normal.  Psychiatric:        Behavior: Behavior normal.        Thought Content: Thought content normal.      LABORATORY DATA:  I have reviewed the data as listed Lab Results  Component Value Date   WBC 2.6 (L) 08/12/2018   HGB 9.4 (L) 08/12/2018   HCT 30.0 (L) 08/12/2018   MCV 101.0 (H) 08/12/2018   PLT 148 (L) 08/12/2018   Recent Labs    06/24/18 1322 07/15/18 1222 08/12/18 1229  NA 138 141 137  K 4.1 4.0 4.3  CL 107 109 110  CO2 _0 GLUCOSE 92 122* 209*  BUN _1 CREATININE 0.95 0.96 1.09*  CALCIUM 8.8* 8.8* 8.2*  GFRNONAA 57*  56* 48*  GFRAA >60 >60 56*  PROT 6.5 6.8 6.3*  ALBUMIN 3.7 3.8 3.4*  AST _0 ALT _1 ALKPHOS 114 128* 119  BILITOT 0.6 0.7 0.4       ASSESSMENT & PLAN:  1. Encounter for antineoplastic chemotherapy   2. 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 Thrombocytopenia ([place])   4. B12 deficiency   5. 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 Other fatigue     # M DS: S/p cycles of azacitidine.  Tolerating well. She has good hematological response with improvement of platelet counts. Labs are reviewed and discussed with patient.  Counts acceptable to proceed with cycle 9 azacitidine day 1 to day 5.  #Thrombocytopenia, platelet count stable.  Continue to monitor .#Chemotherapy induced neutropenia, ANC 1.3. Repeat CBC in 2 weeks for monitoring. #Anemia.  Hemoglobin slightly trended down.  Continue to monitor. #Vitamin B12 deficiency status post daily vitamin B12 injection x5 followed by weekly B12 injection x4. She is taking oral B12 supplementation.  B12 today is 790.  Continue daily B12 supplementation.   Intrinsic factor antibody and antiparietal antibody pending.  We spent sufficient time to discuss many aspect of care, questions were answered to patient's satisfaction. Total face to face encounter time for this patient visit was 25 min. >50% of the time was  spent in counseling and coordination of care.  RTC 4 weeks for evaluation for next cycles of azacitidine.  Earlie Server, MD, PhD Hematology Oncology [facility] at [facility] Pager- [phone] 08/12/2018

## 2018-08-13 ENCOUNTER — Inpatient Hospital Stay: Payer: Medicare Other

## 2018-08-13 VITALS — BP 100/61 | HR 90 | Temp 98.4°F | Resp 18

## 2018-08-13 DIAGNOSIS — D46C Myelodysplastic syndrome with isolated del(5q) chromosomal abnormality: Secondary | ICD-10-CM

## 2018-08-13 DIAGNOSIS — D701 Agranulocytosis secondary to cancer chemotherapy: Secondary | ICD-10-CM | POA: Diagnosis not present

## 2018-08-13 DIAGNOSIS — Z5111 Encounter for antineoplastic chemotherapy: Secondary | ICD-10-CM | POA: Diagnosis not present

## 2018-08-13 DIAGNOSIS — D619 Aplastic anemia, unspecified: Secondary | ICD-10-CM | POA: Diagnosis not present

## 2018-08-13 DIAGNOSIS — T451X5S Adverse effect of antineoplastic and immunosuppressive drugs, sequela: Secondary | ICD-10-CM | POA: Diagnosis not present

## 2018-08-13 DIAGNOSIS — R5383 Other fatigue: Secondary | ICD-10-CM | POA: Diagnosis not present

## 2018-08-13 LAB — INTRINSIC FACTOR ANTIBODIES: Intrinsic Factor: 1 AU/mL (ref 0.0–1.1)

## 2018-08-13 LAB — ANTI-PARIETAL ANTIBODY: Parietal Cell Antibody-IgG: 3 Units (ref 0.0–20.0)

## 2018-08-13 MED ORDER — ONDANSETRON HCL 4 MG PO TABS: 8.0000 mg | ORAL_TABLET | Freq: Once | ORAL | Status: DC

## 2018-08-13 MED ORDER — AZACITIDINE CHEMO SQ INJECTION
75.0000 mg/m2 | Freq: Once | INTRAMUSCULAR | Status: AC
  Administered 2018-08-13: 132.5 mg via SUBCUTANEOUS
  Filled 2018-08-13: qty 5.3

## 2018-08-14 ENCOUNTER — Inpatient Hospital Stay: Payer: Medicare Other

## 2018-08-14 ENCOUNTER — Other Ambulatory Visit: Payer: Self-pay | Admitting: Internal Medicine

## 2018-08-14 VITALS — BP 120/61 | HR 87 | Temp 98.0°F | Resp 20 | Wt 161.3 lb

## 2018-08-14 DIAGNOSIS — D619 Aplastic anemia, unspecified: Secondary | ICD-10-CM | POA: Diagnosis not present

## 2018-08-14 DIAGNOSIS — D46C Myelodysplastic syndrome with isolated del(5q) chromosomal abnormality: Secondary | ICD-10-CM | POA: Diagnosis not present

## 2018-08-14 DIAGNOSIS — D701 Agranulocytosis secondary to cancer chemotherapy: Secondary | ICD-10-CM | POA: Diagnosis not present

## 2018-08-14 DIAGNOSIS — Z5111 Encounter for antineoplastic chemotherapy: Secondary | ICD-10-CM | POA: Diagnosis not present

## 2018-08-14 DIAGNOSIS — R5383 Other fatigue: Secondary | ICD-10-CM | POA: Diagnosis not present

## 2018-08-14 DIAGNOSIS — T451X5S Adverse effect of antineoplastic and immunosuppressive drugs, sequela: Secondary | ICD-10-CM | POA: Diagnosis not present

## 2018-08-14 MED ORDER — AZACITIDINE CHEMO SQ INJECTION
75.0000 mg/m2 | Freq: Once | INTRAMUSCULAR | Status: AC
  Administered 2018-08-14: 132.5 mg via SUBCUTANEOUS
  Filled 2018-08-14: qty 5.3

## 2018-08-14 MED ORDER — ONDANSETRON HCL 4 MG PO TABS: 8.0000 mg | ORAL_TABLET | Freq: Once | ORAL | Status: DC

## 2018-08-15 ENCOUNTER — Inpatient Hospital Stay: Payer: Medicare Other

## 2018-08-15 VITALS — BP 138/64 | HR 80 | Resp 20

## 2018-08-15 DIAGNOSIS — D46C Myelodysplastic syndrome with isolated del(5q) chromosomal abnormality: Secondary | ICD-10-CM | POA: Diagnosis not present

## 2018-08-15 DIAGNOSIS — D619 Aplastic anemia, unspecified: Secondary | ICD-10-CM | POA: Diagnosis not present

## 2018-08-15 DIAGNOSIS — D701 Agranulocytosis secondary to cancer chemotherapy: Secondary | ICD-10-CM | POA: Diagnosis not present

## 2018-08-15 DIAGNOSIS — T451X5S Adverse effect of antineoplastic and immunosuppressive drugs, sequela: Secondary | ICD-10-CM | POA: Diagnosis not present

## 2018-08-15 DIAGNOSIS — Z5111 Encounter for antineoplastic chemotherapy: Secondary | ICD-10-CM | POA: Diagnosis not present

## 2018-08-15 DIAGNOSIS — R5383 Other fatigue: Secondary | ICD-10-CM | POA: Diagnosis not present

## 2018-08-15 MED ORDER — ONDANSETRON HCL 4 MG PO TABS: 8.0000 mg | ORAL_TABLET | Freq: Once | ORAL | Status: DC

## 2018-08-15 MED ORDER — AZACITIDINE CHEMO SQ INJECTION
75.0000 mg/m2 | Freq: Once | INTRAMUSCULAR | Status: AC
  Administered 2018-08-15: 132.5 mg via SUBCUTANEOUS
  Filled 2018-08-15: qty 5.3

## 2018-08-16 ENCOUNTER — Inpatient Hospital Stay: Payer: Medicare Other

## 2018-08-16 VITALS — BP 124/73 | HR 73 | Temp 96.9°F | Resp 18

## 2018-08-16 DIAGNOSIS — T451X5S Adverse effect of antineoplastic and immunosuppressive drugs, sequela: Secondary | ICD-10-CM | POA: Diagnosis not present

## 2018-08-16 DIAGNOSIS — D701 Agranulocytosis secondary to cancer chemotherapy: Secondary | ICD-10-CM | POA: Diagnosis not present

## 2018-08-16 DIAGNOSIS — D46C Myelodysplastic syndrome with isolated del(5q) chromosomal abnormality: Secondary | ICD-10-CM

## 2018-08-16 DIAGNOSIS — Z5111 Encounter for antineoplastic chemotherapy: Secondary | ICD-10-CM | POA: Diagnosis not present

## 2018-08-16 DIAGNOSIS — R5383 Other fatigue: Secondary | ICD-10-CM | POA: Diagnosis not present

## 2018-08-16 DIAGNOSIS — D619 Aplastic anemia, unspecified: Secondary | ICD-10-CM | POA: Diagnosis not present

## 2018-08-16 MED ORDER — ONDANSETRON HCL 4 MG PO TABS: 8.0000 mg | ORAL_TABLET | Freq: Once | ORAL | Status: DC

## 2018-08-16 MED ORDER — AZACITIDINE CHEMO SQ INJECTION
75.0000 mg/m2 | Freq: Once | INTRAMUSCULAR | Status: AC
  Administered 2018-08-16: 132.5 mg via SUBCUTANEOUS
  Filled 2018-08-16: qty 5.3

## 2018-08-26 ENCOUNTER — Inpatient Hospital Stay: Payer: Medicare Other

## 2018-08-26 DIAGNOSIS — D701 Agranulocytosis secondary to cancer chemotherapy: Secondary | ICD-10-CM | POA: Diagnosis not present

## 2018-08-26 DIAGNOSIS — Z5111 Encounter for antineoplastic chemotherapy: Secondary | ICD-10-CM | POA: Diagnosis not present

## 2018-08-26 DIAGNOSIS — R5383 Other fatigue: Secondary | ICD-10-CM | POA: Diagnosis not present

## 2018-08-26 DIAGNOSIS — D46C Myelodysplastic syndrome with isolated del(5q) chromosomal abnormality: Secondary | ICD-10-CM | POA: Diagnosis not present

## 2018-08-26 DIAGNOSIS — T451X5S Adverse effect of antineoplastic and immunosuppressive drugs, sequela: Secondary | ICD-10-CM | POA: Diagnosis not present

## 2018-08-26 DIAGNOSIS — D619 Aplastic anemia, unspecified: Secondary | ICD-10-CM | POA: Diagnosis not present

## 2018-08-26 LAB — CBC WITH DIFFERENTIAL/PLATELET
Abs Immature Granulocytes: 0.02 10*3/uL (ref 0.00–0.07)
BASOS ABS: 0 10*3/uL (ref 0.0–0.1)
Basophils Relative: 0 %
Eosinophils Absolute: 0.1 10*3/uL (ref 0.0–0.5)
Eosinophils Relative: 2 %
HCT: 29.9 % — ABNORMAL LOW (ref 36.0–46.0)
Hemoglobin: 9.6 g/dL — ABNORMAL LOW (ref 12.0–15.0)
Immature Granulocytes: 0 %
Lymphocytes Relative: 19 %
Lymphs Abs: 0.9 10*3/uL (ref 0.7–4.0)
MCH: 32 pg (ref 26.0–34.0)
MCHC: 32.1 g/dL (ref 30.0–36.0)
MCV: 99.7 fL (ref 80.0–100.0)
Monocytes Absolute: 0.2 10*3/uL (ref 0.1–1.0)
Monocytes Relative: 5 %
NRBC: 0 % (ref 0.0–0.2)
Neutro Abs: 3.4 10*3/uL (ref 1.7–7.7)
Neutrophils Relative %: 74 %
PLATELETS: 70 10*3/uL — AB (ref 150–400)
RBC: 3 MIL/uL — ABNORMAL LOW (ref 3.87–5.11)
RDW: 17 % — ABNORMAL HIGH (ref 11.5–15.5)
WBC: 4.7 10*3/uL (ref 4.0–10.5)

## 2018-08-26 LAB — COMPREHENSIVE METABOLIC PANEL
ALT: 14 U/L (ref 0–44)
ANION GAP: 8 (ref 5–15)
AST: 20 U/L (ref 15–41)
Albumin: 3.2 g/dL — ABNORMAL LOW (ref 3.5–5.0)
Alkaline Phosphatase: 114 U/L (ref 38–126)
BUN: 15 mg/dL (ref 8–23)
CO2: 22 mmol/L (ref 22–32)
Calcium: 8.4 mg/dL — ABNORMAL LOW (ref 8.9–10.3)
Chloride: 108 mmol/L (ref 98–111)
Creatinine, Ser: 0.94 mg/dL (ref 0.44–1.00)
GFR calc Af Amer: >60 mL/min (ref >60)
GFR calc non Af Amer: 58 mL/min — ABNORMAL LOW (ref >60)
Glucose, Bld: 254 mg/dL — ABNORMAL HIGH (ref 70–99)
Potassium: 3.8 mmol/L (ref 3.5–5.1)
Sodium: 138 mmol/L (ref 135–145)
Total Bilirubin: 0.7 mg/dL (ref 0.3–1.2)
Total Protein: 6.9 g/dL (ref 6.5–8.1)

## 2018-08-29 ENCOUNTER — Other Ambulatory Visit: Payer: Self-pay | Admitting: Internal Medicine

## 2018-09-09 ENCOUNTER — Encounter: Payer: Self-pay | Admitting: Oncology

## 2018-09-09 ENCOUNTER — Inpatient Hospital Stay: Payer: Medicare Other | Attending: Oncology | Admitting: Oncology

## 2018-09-09 ENCOUNTER — Inpatient Hospital Stay: Payer: Medicare Other | Attending: Oncology

## 2018-09-09 ENCOUNTER — Inpatient Hospital Stay: Payer: Medicare Other

## 2018-09-09 ENCOUNTER — Other Ambulatory Visit: Payer: Self-pay

## 2018-09-09 VITALS — BP 125/64 | HR 57 | Temp 97.3°F | Resp 18 | Wt 161.1 lb

## 2018-09-09 DIAGNOSIS — Z7984 Long term (current) use of oral hypoglycemic drugs: Secondary | ICD-10-CM | POA: Insufficient documentation

## 2018-09-09 DIAGNOSIS — Z79899 Other long term (current) drug therapy: Secondary | ICD-10-CM | POA: Insufficient documentation

## 2018-09-09 DIAGNOSIS — E119 Type 2 diabetes mellitus without complications: Secondary | ICD-10-CM | POA: Diagnosis not present

## 2018-09-09 DIAGNOSIS — E079 Disorder of thyroid, unspecified: Secondary | ICD-10-CM | POA: Diagnosis not present

## 2018-09-09 DIAGNOSIS — D46C Myelodysplastic syndrome with isolated del(5q) chromosomal abnormality: Secondary | ICD-10-CM | POA: Diagnosis not present

## 2018-09-09 DIAGNOSIS — D703 Neutropenia due to infection: Secondary | ICD-10-CM | POA: Insufficient documentation

## 2018-09-09 DIAGNOSIS — Z5111 Encounter for antineoplastic chemotherapy: Secondary | ICD-10-CM

## 2018-09-09 DIAGNOSIS — Z87891 Personal history of nicotine dependence: Secondary | ICD-10-CM | POA: Diagnosis not present

## 2018-09-09 DIAGNOSIS — D696 Thrombocytopenia, unspecified: Secondary | ICD-10-CM

## 2018-09-09 LAB — COMPREHENSIVE METABOLIC PANEL
ALT: 12 U/L (ref 0–44)
AST: 16 U/L (ref 15–41)
Albumin: 3.1 g/dL — ABNORMAL LOW (ref 3.5–5.0)
Alkaline Phosphatase: 104 U/L (ref 38–126)
Anion gap: 6 (ref 5–15)
BUN: 16 mg/dL (ref 8–23)
CO2: 25 mmol/L (ref 22–32)
CREATININE: 1.07 mg/dL — AB (ref 0.44–1.00)
Calcium: 8.3 mg/dL — ABNORMAL LOW (ref 8.9–10.3)
Chloride: 108 mmol/L (ref 98–111)
GFR calc Af Amer: 57 mL/min — ABNORMAL LOW (ref >60)
GFR, EST NON AFRICAN AMERICAN: 49 mL/min — AB (ref >60)
Glucose, Bld: 80 mg/dL (ref 70–99)
Potassium: 4.1 mmol/L (ref 3.5–5.1)
Sodium: 139 mmol/L (ref 135–145)
Total Bilirubin: 0.5 mg/dL (ref 0.3–1.2)
Total Protein: 6.3 g/dL — ABNORMAL LOW (ref 6.5–8.1)

## 2018-09-09 LAB — CBC WITH DIFFERENTIAL/PLATELET
Abs Immature Granulocytes: 0 10*3/uL (ref 0.00–0.07)
Basophils Absolute: 0 10*3/uL (ref 0.0–0.1)
Basophils Relative: 1 %
Eosinophils Absolute: 0.1 10*3/uL (ref 0.0–0.5)
Eosinophils Relative: 3 %
HEMATOCRIT: 29.3 % — AB (ref 36.0–46.0)
HEMOGLOBIN: 9.3 g/dL — AB (ref 12.0–15.0)
Immature Granulocytes: 0 %
LYMPHS PCT: 55 %
Lymphs Abs: 1.3 10*3/uL (ref 0.7–4.0)
MCH: 32.1 pg (ref 26.0–34.0)
MCHC: 31.7 g/dL (ref 30.0–36.0)
MCV: 101 fL — ABNORMAL HIGH (ref 80.0–100.0)
MONO ABS: 0.2 10*3/uL (ref 0.1–1.0)
Monocytes Relative: 9 %
Neutro Abs: 0.8 10*3/uL — ABNORMAL LOW (ref 1.7–7.7)
Neutrophils Relative %: 32 %
Platelets: 161 10*3/uL (ref 150–400)
RBC: 2.9 MIL/uL — ABNORMAL LOW (ref 3.87–5.11)
RDW: 17.5 % — ABNORMAL HIGH (ref 11.5–15.5)
WBC: 2.4 10*3/uL — ABNORMAL LOW (ref 4.0–10.5)
nRBC: 0 % (ref 0.0–0.2)

## 2018-09-09 NOTE — Progress Notes (Signed)
Hematology/Oncology Follow up note [facility] Telephone:(336) [phone] Fax:(336) [phone]   Patient Care Team: Glean Hess, MD as PCP - General (Internal Medicine)  REFERRING PROVIDER: Glean Hess, MD  Previous Oncologist Dr.Srujitha Murkutla REASON FOR VISIT Follow up for treatment of MDS  HISTORY OF PRESENTING ILLNESS:  Yesenia Hart is a  78 y.o.  female who recently moved from [place] to [place].  She used to live at [place] with husband.  Accompanied by daughter, Yesenia Hart who is a respiratory therapist at [employer].  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  Patient presented to emergency room in January 2019 with complaints of intermittent rectal bleeding and increased to have a hemoglobin of 3 and platelet counts was 59,000.  Received 5 units of PRBC and was discharged home with hemoglobin of 10.  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  Cauterization was deferred due to thrombocytopenia. Aspirin was discontinued due to GI bleeding. Peripheral blood flow, SPEP negative, no obvious signs of B12 deficiency.  Ultrasound of the abdomen showed mild fatty liver.  On October 04, 2017, patient underwent bone marrow biopsy. Bone marrow core biopsy, bone marrow aspirate clot, bone marrow aspirate smears showed  single linage dysplasia.  Less than 5% bone marrow blasts and no circulating blast. Iron storage is nearly absent Peripheral blood smear showed moderate and anisopoikilocytosis Marrow aspirate normal in  adenoid maturation Cytogenetics performed at the [facility] in [place] showed 46,xx, t(3;17;16)(p21;q21;q24), add (5) (q11,2), idic(22)(p11,2)[18]/46,xx[2].  20 metaphases, lites were normal and 18 metaphases 5 q. deletion, it has translocation involving  chromosome of 3, 17, 16, and iso-di centric 22.  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  Patient received IV iron infusion on moving to [place].  She has not had any treatment for MDS done due to the move.  Today patient is accompanied by her daughter to the clinic.  She reports feeling tired, no weight loss.  She reports easy bruising.  Intermittent epistaxis which spontaneously resolved after applying pressure denies any hematochezia, hematemesis, hemoptysis.  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 Although patient does have 5q deletion which if isolated, is a good cytogentic group, she carried other cytogentic abnormalities which increases her IPSS-R score.   INTERVAL HISTORY Yesenia Hart is a 78 y.o. female who has above history reviewed by me today presents for assessment prior to MDS treatment with azacitidine.  She reports recently had an episode of upper respiratory infection and is recovering from it.  Reports whole family was sick recently.  Denies any fever, chills.  Had cough which is improved already.  Denies any nausea, vomiting, diarrhea. No new complaints today. Review of Systems  Constitutional: Positive for fatigue. Negative for appetite change, chills and fever.  HENT:   Negative for hearing loss and voice change.   Eyes: Negative for eye problems.  Respiratory: Negative for chest tightness and cough.   Cardiovascular: Negative for chest pain.  Gastrointestinal: Negative for abdominal distention, abdominal pain and blood in stool.  Endocrine: Negative for hot flashes.  Genitourinary: Negative for difficulty urinating and frequency.   Musculoskeletal: Negative for arthralgias.  Skin: Negative for itching and rash.  Neurological: Negative for extremity weakness.  Hematological: Negative for adenopathy.  Psychiatric/Behavioral: Negative  for confusion.    MEDICAL  HISTORY:  Past Medical History:  Diagnosis Date  . Anemia   . B12 deficiency 06/10/2018  . Clotting disorder ([place])   . Diabetes mellitus without complication ([place])   . IBS (irritable bowel syndrome)   . Iron deficiency anemia due to chronic blood loss 12/12/2017  . MDS (myelodysplastic syndrome) ([place])   . MDS (myelodysplastic syndrome) ([place])   . Thyroid disease     SURGICAL HISTORY: Past Surgical History:  Procedure Laterality Date  . APPENDECTOMY    . breast biopsy 73'    . CHOLECYSTECTOMY    . CORONARY ANGIOPLASTY WITH STENT PLACEMENT  2016   in [place]: Social History   Socioeconomic History  . Marital status: Married    Spouse name: Not on file  . Number of children: Not on file  . Years of education: Not on file  . Highest education level: Not on file  Occupational History  . Occupation: retired  Scientific laboratory technician  . Financial resource strain: Not on file  . Food insecurity:    Worry: Not on file    Inability: Not on file  . Transportation needs:    Medical: Not on file    Non-medical: Not on file  Tobacco Use  . Smoking status: Former Smoker    Packs/day: 1.00    Types: Cigarettes    Last attempt to quit: 1999    Years since quitting: 21.1  . Smokeless tobacco: Never Used  Substance and Sexual Activity  . Alcohol use: Not Currently  . Drug use: Never  . Sexual activity: Yes  Lifestyle  . Physical activity:    Days per week: Not on file    Minutes per session: Not on file  . Stress: Not on file  Relationships  . Social connections:    Talks on phone: Not on file    Gets together: Not on file    Attends religious service: Not on file    Active member of club or organization: Not on file    Attends meetings of clubs or organizations: Not on file    Relationship status: Not on file  . Intimate partner violence:    Fear of current or ex partner: Not on file    Emotionally abused: Not on file    Physically abused: Not on file    Forced  sexual activity: Not on file  Other Topics Concern  . Not on file  Social History Narrative  . Not on file    FAMILY HISTORY: Family History  Adopted: Yes  Family history unknown: Yes    ALLERGIES:  is allergic to acarbose; demerol [meperidine hcl]; meperidine; and pioglitazone.  MEDICATIONS:  Current Outpatient Medications  Medication Sig Dispense Refill  . atorvastatin (LIPITOR) 20 MG tablet Take 1 tablet (20 mg total) by mouth daily. 90 tablet 1  . azaCITIDine in lactated ringers infusion Inject 100 mg/m2 into the vein daily.    . furosemide (LASIX) 20 MG tablet TAKE 1 TABLET BY MOUTH ONCE DAILY 30 tablet 3  . glimepiride (AMARYL) 4 MG tablet TAKE 1 TABLET BY MOUTH ONCE DAILY 90 tablet 1  . Lancets (ONETOUCH ULTRASOFT) lancets Use as instructed 100 each 12  . levothyroxine (SYNTHROID, LEVOTHROID) 50 MCG tablet TAKE 1 TABLET BY MOUTH ONCE DAILY ON AN EMPTY STOMACH. WAIT 30 MINUTES BEFORE TAKING OTHER MEDS. 90 tablet 1  . NOVOLOG MIX 70/30 FLEXPEN (70-30) 100 UNIT/ML FlexPen Inject 0.1-0.15 mLs (10-15  Units total) into the skin 2 (two) times daily with a meal. (Patient taking differently: Inject 8 Units into the skin 2 (two) times daily with a meal. ) 21 mL 1  . nystatin (MYCOSTATIN/NYSTOP) powder nystatin 100,000 unit/gram topical powder   1 app by topical route.    . ondansetron (ZOFRAN) 8 MG tablet TAKE 1 TABLET BY MOUTH TWICE DAILY AS NEEDED NAUSEA AND VOMITING 30 tablet 1  . ONE TOUCH ULTRA TEST test strip TEST TWICE DAILY 200 each 1  . pantoprazole (PROTONIX) 40 MG tablet Take 1 tablet (40 mg total) by mouth daily. 90 tablet 1  . quinapril (ACCUPRIL) 20 MG tablet Take 1 tablet (20 mg total) by mouth daily. 90 tablet 1  . sulfaSALAzine (AZULFIDINE) 500 MG EC tablet TAKE 1 TABLET BY MOUTH AT BEDTIME 90 tablet 1  . ULTICARE MICRO PEN NEEDLES 32G X 4 MM MISC Inject 1 each into the skin 2 (two) times daily. 100 each 12   No current facility-administered medications for this visit.       PHYSICAL EXAMINATION: ECOG PERFORMANCE STATUS: 1 - Symptomatic but completely ambulatory Vitals:   09/09/18 1306  BP: 125/64  Pulse: (!) 57  Resp: 18  Temp: (!) 97.3 F (36.3 C)   Filed Weights   09/09/18 1306  Weight: 161 lb 1.6 oz (73.1 kg)    Physical Exam Constitutional:      General: She is not in acute distress. HENT:     Head: Normocephalic and atraumatic.  Eyes:     General: No scleral icterus.    Conjunctiva/sclera: Conjunctivae normal.     Pupils: Pupils are equal, round, and reactive to light.  Neck:     Musculoskeletal: Normal range of motion and neck supple.  Cardiovascular:     Rate and Rhythm: Normal rate and regular rhythm.     Heart sounds: Normal heart sounds.  Pulmonary:     Effort: Pulmonary effort is normal. No respiratory distress.     Breath sounds: Normal breath sounds. No wheezing or rales.  Chest:     Chest wall: No tenderness.  Abdominal:     General: Bowel sounds are normal. There is no distension.     Palpations: Abdomen is soft. There is no mass.     Tenderness: There is no abdominal tenderness.  Musculoskeletal: Normal range of motion.        General: No deformity.  Lymphadenopathy:     Cervical: No cervical adenopathy.  Skin:    General: Skin is warm and dry.     Findings: No erythema or rash.  Neurological:     Mental Status: She is alert and oriented to person, place, and time.     Cranial Nerves: No cranial nerve deficit.     Coordination: Coordination normal.  Psychiatric:        Behavior: Behavior normal.        Thought Content: Thought content normal.      LABORATORY DATA:  I have reviewed the data as listed Lab Results  Component Value Date   WBC 2.4 (L) 09/09/2018   HGB 9.3 (L) 09/09/2018   HCT 29.3 (L) 09/09/2018   MCV 101.0 (H) 09/09/2018   PLT 161 09/09/2018   Recent Labs    08/12/18 1229 08/26/18 1313 09/09/18 1240  NA 137 138 139  K 4.3 3.8 4.1  CL 110 108 108  CO2 22 22 25   GLUCOSE 209*  254* 80  BUN 18 15 16   CREATININE 1.09*  0.94 1.07*  CALCIUM 8.2* 8.4* 8.3*  GFRNONAA 48* 58* 49*  GFRAA 56* >60 57*  PROT 6.3* 6.9 6.3*  ALBUMIN 3.4* 3.2* 3.1*  AST 19 20 16   ALT 14 14 12   ALKPHOS 119 114 104  BILITOT 0.4 0.7 0.5       ASSESSMENT & PLAN:  1. 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 Encounter for antineoplastic chemotherapy   3. Thrombocytopenia ([place])   4. Neutropenia associated with infection ([place])     # MDS: She has tolerated azacitidine and responded to treatment very well. Good hematological response with improvement of platelet counts. Labs are reviewed and discussed with patient. She has neutropenia which I suspect is most likely from her recent upper respiratory infection. Hold chemotherapy today. Repeat CBC and CMP 1 week and proceed with cycle 10 azacitidine day 1 to day 5.  Thrombocytopenia, platelet count stable.  Continue to monitor .  Anemia, hemoglobin 9.3, at her baseline.  Continue to monitor.  We spent sufficient time to discuss many aspect of care, questions were answered to patient's satisfaction. Total face to face encounter time for this patient visit was 25 min. >50% of the time was  spent in counseling and coordination of care.   RTC 5 weeks for evaluation for next cycles of azacitidine.  Earlie Server, MD, PhD Hematology Oncology [facility] at [facility] Pager- [phone] 09/09/2018

## 2018-09-09 NOTE — Progress Notes (Signed)
Patient here for follow up. No concerns voiced.  °

## 2018-09-10 ENCOUNTER — Inpatient Hospital Stay: Payer: Medicare Other

## 2018-09-11 ENCOUNTER — Inpatient Hospital Stay: Payer: Medicare Other

## 2018-09-12 ENCOUNTER — Inpatient Hospital Stay: Payer: Medicare Other

## 2018-09-13 ENCOUNTER — Ambulatory Visit: Payer: Medicare Other

## 2018-09-13 ENCOUNTER — Inpatient Hospital Stay: Payer: Medicare Other

## 2018-09-16 ENCOUNTER — Inpatient Hospital Stay (HOSPITAL_BASED_OUTPATIENT_CLINIC_OR_DEPARTMENT_OTHER): Payer: Medicare Other | Admitting: Oncology

## 2018-09-16 ENCOUNTER — Encounter: Payer: Self-pay | Admitting: Oncology

## 2018-09-16 ENCOUNTER — Other Ambulatory Visit: Payer: Self-pay

## 2018-09-16 ENCOUNTER — Inpatient Hospital Stay: Payer: Medicare Other

## 2018-09-16 VITALS — BP 126/59 | HR 62 | Temp 97.0°F | Resp 18 | Wt 160.3 lb

## 2018-09-16 VITALS — BP 135/65 | HR 65 | Temp 97.5°F | Resp 18

## 2018-09-16 DIAGNOSIS — Z79899 Other long term (current) drug therapy: Secondary | ICD-10-CM

## 2018-09-16 DIAGNOSIS — D46C Myelodysplastic syndrome with isolated del(5q) chromosomal abnormality: Secondary | ICD-10-CM | POA: Diagnosis not present

## 2018-09-16 DIAGNOSIS — Z7984 Long term (current) use of oral hypoglycemic drugs: Secondary | ICD-10-CM

## 2018-09-16 DIAGNOSIS — E079 Disorder of thyroid, unspecified: Secondary | ICD-10-CM | POA: Diagnosis not present

## 2018-09-16 DIAGNOSIS — D703 Neutropenia due to infection: Secondary | ICD-10-CM | POA: Diagnosis not present

## 2018-09-16 DIAGNOSIS — E119 Type 2 diabetes mellitus without complications: Secondary | ICD-10-CM | POA: Diagnosis not present

## 2018-09-16 DIAGNOSIS — Z5111 Encounter for antineoplastic chemotherapy: Secondary | ICD-10-CM | POA: Diagnosis not present

## 2018-09-16 DIAGNOSIS — E538 Deficiency of other specified B group vitamins: Secondary | ICD-10-CM

## 2018-09-16 DIAGNOSIS — Z87891 Personal history of nicotine dependence: Secondary | ICD-10-CM | POA: Diagnosis not present

## 2018-09-16 DIAGNOSIS — D696 Thrombocytopenia, unspecified: Secondary | ICD-10-CM

## 2018-09-16 LAB — COMPREHENSIVE METABOLIC PANEL
ALT: 13 U/L (ref 0–44)
ANION GAP: 7 (ref 5–15)
AST: 17 U/L (ref 15–41)
Albumin: 3.6 g/dL (ref 3.5–5.0)
Alkaline Phosphatase: 108 U/L (ref 38–126)
BUN: 16 mg/dL (ref 8–23)
CO2: 23 mmol/L (ref 22–32)
Calcium: 8.3 mg/dL — ABNORMAL LOW (ref 8.9–10.3)
Chloride: 107 mmol/L (ref 98–111)
Creatinine, Ser: 1.16 mg/dL — ABNORMAL HIGH (ref 0.44–1.00)
GFR calc Af Amer: 52 mL/min — ABNORMAL LOW (ref >60)
GFR calc non Af Amer: 45 mL/min — ABNORMAL LOW (ref >60)
Glucose, Bld: 188 mg/dL — ABNORMAL HIGH (ref 70–99)
Potassium: 4 mmol/L (ref 3.5–5.1)
SODIUM: 137 mmol/L (ref 135–145)
TOTAL PROTEIN: 6.9 g/dL (ref 6.5–8.1)
Total Bilirubin: 0.5 mg/dL (ref 0.3–1.2)

## 2018-09-16 LAB — CBC WITH DIFFERENTIAL/PLATELET
Abs Immature Granulocytes: 0.02 10*3/uL (ref 0.00–0.07)
Basophils Absolute: 0 10*3/uL (ref 0.0–0.1)
Basophils Relative: 1 %
Eosinophils Absolute: 0.1 10*3/uL (ref 0.0–0.5)
Eosinophils Relative: 2 %
HCT: 31.3 % — ABNORMAL LOW (ref 36.0–46.0)
Hemoglobin: 9.9 g/dL — ABNORMAL LOW (ref 12.0–15.0)
Immature Granulocytes: 1 %
Lymphocytes Relative: 42 %
Lymphs Abs: 1.4 10*3/uL (ref 0.7–4.0)
MCH: 32 pg (ref 26.0–34.0)
MCHC: 31.6 g/dL (ref 30.0–36.0)
MCV: 101.3 fL — ABNORMAL HIGH (ref 80.0–100.0)
Monocytes Absolute: 0.3 10*3/uL (ref 0.1–1.0)
Monocytes Relative: 11 %
Neutro Abs: 1.3 10*3/uL — ABNORMAL LOW (ref 1.7–7.7)
Neutrophils Relative %: 43 %
Platelets: 109 10*3/uL — ABNORMAL LOW (ref 150–400)
RBC: 3.09 MIL/uL — ABNORMAL LOW (ref 3.87–5.11)
RDW: 17.2 % — ABNORMAL HIGH (ref 11.5–15.5)
WBC: 3.1 10*3/uL — ABNORMAL LOW (ref 4.0–10.5)
nRBC: 0 % (ref 0.0–0.2)

## 2018-09-16 MED ORDER — ONDANSETRON HCL 4 MG PO TABS
8.0000 mg | ORAL_TABLET | Freq: Once | ORAL | Status: DC
  Filled 2018-09-16: qty 2

## 2018-09-16 MED ORDER — AZACITIDINE CHEMO SQ INJECTION
75.0000 mg/m2 | Freq: Once | INTRAMUSCULAR | Status: AC
  Administered 2018-09-16: 132.5 mg via SUBCUTANEOUS
  Filled 2018-09-16: qty 5.3

## 2018-09-16 NOTE — Progress Notes (Signed)
Hematology/Oncology Follow up note Yesenia Hart Telephone:(336) [phone] Fax:([phone]   Patient Care Team: Yesenia Hess, MD as PCP - General (Internal Medicine)  REFERRING PROVIDER: Glean Hess, MD  Previous Oncologist Dr.Srujitha Murkutla REASON FOR VISIT Follow up for treatment of MDS  HISTORY OF PRESENTING ILLNESS:  Yesenia Hart is a  78 y.o.  female who recently moved from [place] to [place].  She used to live at [place] with husband.  Accompanied by daughter, Yesenia Hart who is a respiratory therapist at [employer].  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  Patient presented to emergency room in January 2019 with complaints of intermittent rectal bleeding and increased to have a hemoglobin of 3 and platelet counts was 59,000.  Received 5 units of PRBC and was discharged home with hemoglobin of 10.  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  Cauterization was deferred due to thrombocytopenia. Aspirin was discontinued due to GI bleeding. Peripheral blood flow, SPEP negative, no obvious signs of B12 deficiency.  Ultrasound of the abdomen showed mild fatty liver.  On October 04, 2017, patient underwent bone marrow biopsy. Bone marrow core biopsy, bone marrow aspirate clot, bone marrow aspirate smears showed  single linage dysplasia.  Less than 5% bone marrow blasts and no circulating blast. 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  20 metaphases, lites were normal and 18 metaphases 5 q. deletion, it has translocation involving  chromosome of 3, 17, 16, and iso-di centric 22.  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  Patient received IV iron infusion on moving to [place].  She has not had any treatment for MDS done due to the move.  Today patient is accompanied by her daughter to the clinic.  She reports feeling tired, no weight loss.  She reports easy bruising.  Intermittent epistaxis which spontaneously resolved after applying pressure denies any hematochezia, hematemesis, hemoptysis.  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 Although patient does have 5q deletion which if isolated, is a good cytogentic group, she carried other cytogentic abnormalities which increases her IPSS-R score.   INTERVAL HISTORY Yesenia Hart is a 78 y.o. female who has above history reviewed by me today presents for assessment prior to MDS treatment with azacitidine.   #Patient was seen by me 7 days ago.  At that time chemotherapy was held due to neutropenia. Patient recently had upper respiratory infection and is recovering from it. Reports feeling well.  Cough has improved.  Still has intermittent cough nonproductive.  Denies any nausea, vomiting, diarrhea, chest pain, Review of Systems  Constitutional: Positive for fatigue. Negative for appetite change, chills and fever.  HENT:   Negative for hearing loss and voice change.   Eyes: Negative for eye problems.  Respiratory: Positive for cough. Negative for chest tightness.   Cardiovascular: Negative for chest pain.  Gastrointestinal: Negative for abdominal distention, abdominal pain and blood in stool.  Endocrine: Negative for hot flashes.  Genitourinary: Negative for difficulty urinating and frequency.   Musculoskeletal: Negative for arthralgias.  Skin: Negative for itching and rash.  Neurological: Negative for extremity weakness.  Hematological: Negative for adenopathy.    Psychiatric/Behavioral: Negative for confusion.    MEDICAL HISTORY:  Past Medical History:  Diagnosis Date  . Anemia   . B12 deficiency 06/10/2018  . Clotting disorder ([place])   . Diabetes mellitus without complication ([place])   . IBS (irritable bowel syndrome)   . Iron deficiency anemia due to chronic blood loss 12/12/2017  . MDS (myelodysplastic syndrome) ([place])   . MDS (myelodysplastic syndrome) ([place])   . Thyroid disease     SURGICAL HISTORY: Past Surgical History:  Procedure Laterality Date  . APPENDECTOMY    . breast biopsy 73'    . CHOLECYSTECTOMY    . CORONARY ANGIOPLASTY WITH STENT PLACEMENT  2016   in [place]: Social History   Socioeconomic History  . Marital status: Married    Spouse name: Not on file  . Number of children: Not on file  . Years of education: Not on file  . Highest education level: Not on file  Occupational History  . Occupation: retired  Scientific laboratory technician  . Financial resource strain: Not on file  . Food insecurity:    Worry: Not on file    Inability: Not on file  . Transportation needs:    Medical: Not on file    Non-medical: Not on file  Tobacco Use  . Smoking status: Former Smoker    Packs/day: 1.00    Types: Cigarettes    Last attempt to quit: 1999    Years since quitting: 21.2  . Smokeless tobacco: Never Used  Substance and Sexual Activity  . Alcohol use: Not Currently  . Drug use: Never  . Sexual activity: Yes  Lifestyle  . Physical activity:    Days per week: Not on file    Minutes per session: Not on file  . Stress: Not on file  Relationships  . Social connections:    Talks on phone: Not on file    Gets together: Not on file    Attends religious service: Not on file    Active member of club or organization: Not on file    Attends meetings of clubs or organizations: Not on file    Relationship status: Not on file  . Intimate partner violence:    Fear of current or ex partner: Not on file    Emotionally  abused: Not on file    Physically abused: Not on file    Forced sexual activity: Not on file  Other Topics Concern  . Not on file  Social History Narrative  . Not on file    FAMILY HISTORY: Family History  Adopted: Yes  Family history unknown: Yes    ALLERGIES:  is allergic to acarbose; demerol [meperidine hcl]; meperidine; and pioglitazone.  MEDICATIONS:  Current Outpatient Medications  Medication Sig Dispense Refill  . atorvastatin (LIPITOR) 20 MG tablet Take 1 tablet (20 mg total) by mouth daily. 90 tablet 1  . azaCITIDine in lactated ringers infusion Inject 100 mg/m2 into the vein daily.    . furosemide (LASIX) 20 MG tablet TAKE 1 TABLET BY MOUTH ONCE DAILY 30 tablet 3  . glimepiride (AMARYL) 4 MG tablet TAKE 1 TABLET BY MOUTH ONCE DAILY 90 tablet 1  . Lancets (ONETOUCH ULTRASOFT) lancets Use as instructed 100 each 12  . levothyroxine (SYNTHROID, LEVOTHROID) 50 MCG tablet TAKE 1 TABLET BY MOUTH ONCE DAILY ON AN EMPTY STOMACH. WAIT 30 MINUTES BEFORE TAKING OTHER MEDS. 90 tablet 1  . NOVOLOG MIX 70/30 FLEXPEN (  70-30) 100 UNIT/ML FlexPen Inject 0.1-0.15 mLs (10-15 Units total) into the skin 2 (two) times daily with a meal. (Patient taking differently: Inject 8 Units into the skin 2 (two) times daily with a meal. ) 21 mL 1  . nystatin (MYCOSTATIN/NYSTOP) powder nystatin 100,000 unit/gram topical powder   1 app by topical route.    . ondansetron (ZOFRAN) 8 MG tablet TAKE 1 TABLET BY MOUTH TWICE DAILY AS NEEDED NAUSEA AND VOMITING 30 tablet 1  . ONE TOUCH ULTRA TEST test strip TEST TWICE DAILY 200 each 1  . pantoprazole (PROTONIX) 40 MG tablet Take 1 tablet (40 mg total) by mouth daily. 90 tablet 1  . quinapril (ACCUPRIL) 20 MG tablet Take 1 tablet (20 mg total) by mouth daily. 90 tablet 1  . sulfaSALAzine (AZULFIDINE) 500 MG EC tablet TAKE 1 TABLET BY MOUTH AT BEDTIME 90 tablet 1  . ULTICARE MICRO PEN NEEDLES 32G X 4 MM MISC Inject 1 each into the skin 2 (two) times daily. 100 each  12   No current facility-administered medications for this visit.      PHYSICAL EXAMINATION: ECOG PERFORMANCE STATUS: 1 - Symptomatic but completely ambulatory Vitals:   09/16/18 1313  BP: (!) 126/59  Pulse: 62  Resp: 18  Temp: (!) 97 F (36.1 C)   Filed Weights   09/16/18 1313  Weight: 160 lb 4.8 oz (72.7 kg)    Physical Exam Constitutional:      General: She is not in acute distress. HENT:     Head: Normocephalic and atraumatic.  Eyes:     General: No scleral icterus.    Conjunctiva/sclera: Conjunctivae normal.     Pupils: Pupils are equal, round, and reactive to light.  Neck:     Musculoskeletal: Normal range of motion and neck supple.  Cardiovascular:     Rate and Rhythm: Normal rate and regular rhythm.     Heart sounds: Normal heart sounds.  Pulmonary:     Effort: Pulmonary effort is normal. No respiratory distress.     Breath sounds: Normal breath sounds. No wheezing or rales.  Chest:     Chest wall: No tenderness.  Abdominal:     General: Bowel sounds are normal. There is no distension.     Palpations: Abdomen is soft. There is no mass.     Tenderness: There is no abdominal tenderness.  Musculoskeletal: Normal range of motion.        General: No deformity.  Lymphadenopathy:     Cervical: No cervical adenopathy.  Skin:    General: Skin is warm and dry.     Findings: No erythema or rash.  Neurological:     Mental Status: She is alert and oriented to person, place, and time.     Cranial Nerves: No cranial nerve deficit.     Coordination: Coordination normal.  Psychiatric:        Behavior: Behavior normal.        Thought Content: Thought content normal.      LABORATORY DATA:  I have reviewed the data as listed Lab Results  Component Value Date   WBC 3.1 (L) 09/16/2018   HGB 9.9 (L) 09/16/2018   HCT 31.3 (L) 09/16/2018   MCV 101.3 (H) 09/16/2018   PLT 109 (L) 09/16/2018   Recent Labs    08/26/18 1313 09/09/18 1240 09/16/18 1258  NA 138  139 137  K 3.8 4.1 4.0  CL 108 108 107  CO2 22 25 23   GLUCOSE 254* 80  188*  BUN 15 16 16   CREATININE 0.94 1.07* 1.16*  CALCIUM 8.4* 8.3* 8.3*  GFRNONAA 58* 49* 45*  GFRAA >60 57* 52*  PROT 6.9 6.3* 6.9  ALBUMIN 3.2* 3.1* 3.6  AST 20 16 17   ALT 14 12 13   ALKPHOS 114 104 108  BILITOT 0.7 0.5 0.5       ASSESSMENT & PLAN:  1. 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 Encounter for antineoplastic chemotherapy   3. Thrombocytopenia ([place])   4. B12 deficiency   5. Neutropenia associated with infection ([place])     # MDS: She has tolerated azacitidine and responded to treatment very well. Good hematological response with improvement of platelet counts. Labs are reviewed and discussed with patient. Neutropenia has improved 1.3 To proceed with 10 azacitidine day 1 to day 5. Repeat CBC in 2 weeks  # thrombocytopenia, counts stable.  Continue to monitor. #History of vitamin B12 deficiency continue oral vitamin B12 supplements. #Normocytic anemia, stable hemoglobin.  Continue to monitor.  RTC  4 weeks for evaluation for next cycles of azacitidine.  Earlie Server, MD, PhD Hematology Oncology [facility] Pager- [phone] 09/16/2018

## 2018-09-16 NOTE — Progress Notes (Signed)
Patient here for follow up. States her cold has gotten better.

## 2018-09-17 ENCOUNTER — Inpatient Hospital Stay: Payer: Medicare Other

## 2018-09-17 VITALS — BP 112/63 | HR 68 | Temp 97.4°F | Resp 18

## 2018-09-17 DIAGNOSIS — Z87891 Personal history of nicotine dependence: Secondary | ICD-10-CM | POA: Diagnosis not present

## 2018-09-17 DIAGNOSIS — Z5111 Encounter for antineoplastic chemotherapy: Secondary | ICD-10-CM | POA: Diagnosis not present

## 2018-09-17 DIAGNOSIS — D46C Myelodysplastic syndrome with isolated del(5q) chromosomal abnormality: Secondary | ICD-10-CM | POA: Diagnosis not present

## 2018-09-17 DIAGNOSIS — D703 Neutropenia due to infection: Secondary | ICD-10-CM | POA: Diagnosis not present

## 2018-09-17 DIAGNOSIS — E119 Type 2 diabetes mellitus without complications: Secondary | ICD-10-CM | POA: Diagnosis not present

## 2018-09-17 DIAGNOSIS — E079 Disorder of thyroid, unspecified: Secondary | ICD-10-CM | POA: Diagnosis not present

## 2018-09-17 MED ORDER — AZACITIDINE CHEMO SQ INJECTION
75.0000 mg/m2 | Freq: Once | INTRAMUSCULAR | Status: AC
  Administered 2018-09-17: 132.5 mg via SUBCUTANEOUS
  Filled 2018-09-17: qty 5.3

## 2018-09-18 ENCOUNTER — Other Ambulatory Visit: Payer: Self-pay

## 2018-09-18 ENCOUNTER — Inpatient Hospital Stay: Payer: Medicare Other

## 2018-09-18 VITALS — BP 125/75 | HR 75 | Temp 98.4°F | Resp 18

## 2018-09-18 DIAGNOSIS — E119 Type 2 diabetes mellitus without complications: Secondary | ICD-10-CM | POA: Diagnosis not present

## 2018-09-18 DIAGNOSIS — E079 Disorder of thyroid, unspecified: Secondary | ICD-10-CM | POA: Diagnosis not present

## 2018-09-18 DIAGNOSIS — Z5111 Encounter for antineoplastic chemotherapy: Secondary | ICD-10-CM | POA: Diagnosis not present

## 2018-09-18 DIAGNOSIS — D46C Myelodysplastic syndrome with isolated del(5q) chromosomal abnormality: Secondary | ICD-10-CM

## 2018-09-18 DIAGNOSIS — Z87891 Personal history of nicotine dependence: Secondary | ICD-10-CM | POA: Diagnosis not present

## 2018-09-18 DIAGNOSIS — D703 Neutropenia due to infection: Secondary | ICD-10-CM | POA: Diagnosis not present

## 2018-09-18 MED ORDER — ONDANSETRON HCL 4 MG PO TABS: 8.0000 mg | ORAL_TABLET | Freq: Once | ORAL | Status: DC

## 2018-09-18 MED ORDER — AZACITIDINE CHEMO SQ INJECTION
75.0000 mg/m2 | Freq: Once | INTRAMUSCULAR | Status: AC
  Administered 2018-09-18: 132.5 mg via SUBCUTANEOUS
  Filled 2018-09-18: qty 5.3

## 2018-09-19 ENCOUNTER — Inpatient Hospital Stay: Payer: Medicare Other

## 2018-09-19 VITALS — BP 148/65 | HR 81 | Temp 98.6°F | Resp 18

## 2018-09-19 DIAGNOSIS — E079 Disorder of thyroid, unspecified: Secondary | ICD-10-CM | POA: Diagnosis not present

## 2018-09-19 DIAGNOSIS — D703 Neutropenia due to infection: Secondary | ICD-10-CM | POA: Diagnosis not present

## 2018-09-19 DIAGNOSIS — D46C Myelodysplastic syndrome with isolated del(5q) chromosomal abnormality: Secondary | ICD-10-CM | POA: Diagnosis not present

## 2018-09-19 DIAGNOSIS — Z87891 Personal history of nicotine dependence: Secondary | ICD-10-CM | POA: Diagnosis not present

## 2018-09-19 DIAGNOSIS — E119 Type 2 diabetes mellitus without complications: Secondary | ICD-10-CM | POA: Diagnosis not present

## 2018-09-19 DIAGNOSIS — Z5111 Encounter for antineoplastic chemotherapy: Secondary | ICD-10-CM | POA: Diagnosis not present

## 2018-09-19 MED ORDER — AZACITIDINE CHEMO SQ INJECTION
75.0000 mg/m2 | Freq: Once | INTRAMUSCULAR | Status: AC
  Administered 2018-09-19: 132.5 mg via SUBCUTANEOUS
  Filled 2018-09-19: qty 5.3

## 2018-09-19 MED ORDER — ONDANSETRON HCL 4 MG PO TABS: 8.0000 mg | ORAL_TABLET | Freq: Once | ORAL | Status: DC

## 2018-09-20 ENCOUNTER — Inpatient Hospital Stay: Payer: Medicare Other

## 2018-09-20 ENCOUNTER — Other Ambulatory Visit: Payer: Self-pay

## 2018-09-20 VITALS — BP 108/64 | HR 91 | Temp 98.7°F | Resp 18

## 2018-09-20 DIAGNOSIS — E079 Disorder of thyroid, unspecified: Secondary | ICD-10-CM | POA: Diagnosis not present

## 2018-09-20 DIAGNOSIS — E119 Type 2 diabetes mellitus without complications: Secondary | ICD-10-CM | POA: Diagnosis not present

## 2018-09-20 DIAGNOSIS — D703 Neutropenia due to infection: Secondary | ICD-10-CM | POA: Diagnosis not present

## 2018-09-20 DIAGNOSIS — D46C Myelodysplastic syndrome with isolated del(5q) chromosomal abnormality: Secondary | ICD-10-CM | POA: Diagnosis not present

## 2018-09-20 DIAGNOSIS — Z5111 Encounter for antineoplastic chemotherapy: Secondary | ICD-10-CM | POA: Diagnosis not present

## 2018-09-20 DIAGNOSIS — Z87891 Personal history of nicotine dependence: Secondary | ICD-10-CM | POA: Diagnosis not present

## 2018-09-20 MED ORDER — AZACITIDINE CHEMO SQ INJECTION
75.0000 mg/m2 | Freq: Once | INTRAMUSCULAR | Status: AC
  Administered 2018-09-20: 132.5 mg via SUBCUTANEOUS
  Filled 2018-09-20: qty 5.3

## 2018-09-20 MED ORDER — ONDANSETRON HCL 4 MG PO TABS: 8.0000 mg | ORAL_TABLET | Freq: Once | ORAL | Status: DC

## 2018-09-27 ENCOUNTER — Other Ambulatory Visit: Payer: Self-pay

## 2018-09-29 ENCOUNTER — Other Ambulatory Visit: Payer: Self-pay

## 2018-09-30 ENCOUNTER — Inpatient Hospital Stay: Payer: Medicare Other

## 2018-09-30 ENCOUNTER — Other Ambulatory Visit: Payer: Self-pay

## 2018-09-30 DIAGNOSIS — E079 Disorder of thyroid, unspecified: Secondary | ICD-10-CM | POA: Diagnosis not present

## 2018-09-30 DIAGNOSIS — E119 Type 2 diabetes mellitus without complications: Secondary | ICD-10-CM | POA: Diagnosis not present

## 2018-09-30 DIAGNOSIS — D46C Myelodysplastic syndrome with isolated del(5q) chromosomal abnormality: Secondary | ICD-10-CM

## 2018-09-30 DIAGNOSIS — Z87891 Personal history of nicotine dependence: Secondary | ICD-10-CM | POA: Diagnosis not present

## 2018-09-30 DIAGNOSIS — Z5111 Encounter for antineoplastic chemotherapy: Secondary | ICD-10-CM | POA: Diagnosis not present

## 2018-09-30 DIAGNOSIS — D703 Neutropenia due to infection: Secondary | ICD-10-CM | POA: Diagnosis not present

## 2018-09-30 LAB — CBC WITH DIFFERENTIAL/PLATELET
ABS IMMATURE GRANULOCYTES: 0.01 10*3/uL (ref 0.00–0.07)
Basophils Absolute: 0 10*3/uL (ref 0.0–0.1)
Basophils Relative: 0 %
Eosinophils Absolute: 0.2 10*3/uL (ref 0.0–0.5)
Eosinophils Relative: 4 %
HCT: 31.1 % — ABNORMAL LOW (ref 36.0–46.0)
Hemoglobin: 9.8 g/dL — ABNORMAL LOW (ref 12.0–15.0)
Immature Granulocytes: 0 %
LYMPHS ABS: 1.3 10*3/uL (ref 0.7–4.0)
Lymphocytes Relative: 33 %
MCH: 31.7 pg (ref 26.0–34.0)
MCHC: 31.5 g/dL (ref 30.0–36.0)
MCV: 100.6 fL — ABNORMAL HIGH (ref 80.0–100.0)
Monocytes Absolute: 0.3 10*3/uL (ref 0.1–1.0)
Monocytes Relative: 7 %
NEUTROS ABS: 2.2 10*3/uL (ref 1.7–7.7)
Neutrophils Relative %: 56 %
Platelets: 51 10*3/uL — ABNORMAL LOW (ref 150–400)
RBC: 3.09 MIL/uL — ABNORMAL LOW (ref 3.87–5.11)
RDW: 16.9 % — ABNORMAL HIGH (ref 11.5–15.5)
WBC: 4 10*3/uL (ref 4.0–10.5)
nRBC: 0 % (ref 0.0–0.2)

## 2018-09-30 LAB — COMPREHENSIVE METABOLIC PANEL
ALT: 17 U/L (ref 0–44)
AST: 20 U/L (ref 15–41)
Albumin: 3.5 g/dL (ref 3.5–5.0)
Alkaline Phosphatase: 113 U/L (ref 38–126)
Anion gap: 9 (ref 5–15)
BUN: 20 mg/dL (ref 8–23)
CO2: 24 mmol/L (ref 22–32)
Calcium: 8.7 mg/dL — ABNORMAL LOW (ref 8.9–10.3)
Chloride: 105 mmol/L (ref 98–111)
Creatinine, Ser: 1.1 mg/dL — ABNORMAL HIGH (ref 0.44–1.00)
GFR calc Af Amer: 55 mL/min — ABNORMAL LOW (ref >60)
GFR calc non Af Amer: 48 mL/min — ABNORMAL LOW (ref >60)
Glucose, Bld: 165 mg/dL — ABNORMAL HIGH (ref 70–99)
POTASSIUM: 4.3 mmol/L (ref 3.5–5.1)
Sodium: 138 mmol/L (ref 135–145)
Total Bilirubin: 0.6 mg/dL (ref 0.3–1.2)
Total Protein: 6.9 g/dL (ref 6.5–8.1)

## 2018-10-02 ENCOUNTER — Other Ambulatory Visit: Payer: Self-pay | Admitting: Internal Medicine

## 2018-10-11 ENCOUNTER — Other Ambulatory Visit: Payer: Self-pay

## 2018-10-11 DIAGNOSIS — D46C Myelodysplastic syndrome with isolated del(5q) chromosomal abnormality: Secondary | ICD-10-CM

## 2018-10-14 ENCOUNTER — Inpatient Hospital Stay: Payer: Medicare Other | Attending: Oncology

## 2018-10-14 ENCOUNTER — Encounter: Payer: Self-pay | Admitting: Oncology

## 2018-10-14 ENCOUNTER — Other Ambulatory Visit: Payer: Self-pay

## 2018-10-14 ENCOUNTER — Inpatient Hospital Stay (HOSPITAL_BASED_OUTPATIENT_CLINIC_OR_DEPARTMENT_OTHER): Payer: Medicare Other | Admitting: Oncology

## 2018-10-14 ENCOUNTER — Inpatient Hospital Stay: Payer: Medicare Other

## 2018-10-14 VITALS — BP 124/69 | HR 69 | Temp 98.4°F | Wt 161.2 lb

## 2018-10-14 DIAGNOSIS — E079 Disorder of thyroid, unspecified: Secondary | ICD-10-CM | POA: Diagnosis not present

## 2018-10-14 DIAGNOSIS — D46C Myelodysplastic syndrome with isolated del(5q) chromosomal abnormality: Secondary | ICD-10-CM | POA: Insufficient documentation

## 2018-10-14 DIAGNOSIS — K589 Irritable bowel syndrome without diarrhea: Secondary | ICD-10-CM

## 2018-10-14 DIAGNOSIS — Z7984 Long term (current) use of oral hypoglycemic drugs: Secondary | ICD-10-CM

## 2018-10-14 DIAGNOSIS — E538 Deficiency of other specified B group vitamins: Secondary | ICD-10-CM

## 2018-10-14 DIAGNOSIS — D709 Neutropenia, unspecified: Secondary | ICD-10-CM | POA: Insufficient documentation

## 2018-10-14 DIAGNOSIS — D696 Thrombocytopenia, unspecified: Secondary | ICD-10-CM

## 2018-10-14 DIAGNOSIS — Z79899 Other long term (current) drug therapy: Secondary | ICD-10-CM | POA: Insufficient documentation

## 2018-10-14 DIAGNOSIS — R5383 Other fatigue: Secondary | ICD-10-CM | POA: Insufficient documentation

## 2018-10-14 DIAGNOSIS — R040 Epistaxis: Secondary | ICD-10-CM | POA: Insufficient documentation

## 2018-10-14 DIAGNOSIS — Z87891 Personal history of nicotine dependence: Secondary | ICD-10-CM | POA: Insufficient documentation

## 2018-10-14 DIAGNOSIS — D703 Neutropenia due to infection: Secondary | ICD-10-CM

## 2018-10-14 DIAGNOSIS — E119 Type 2 diabetes mellitus without complications: Secondary | ICD-10-CM

## 2018-10-14 DIAGNOSIS — Z5111 Encounter for antineoplastic chemotherapy: Secondary | ICD-10-CM

## 2018-10-14 LAB — COMPREHENSIVE METABOLIC PANEL
ALT: 20 U/L (ref 0–44)
AST: 23 U/L (ref 15–41)
Albumin: 3.4 g/dL — ABNORMAL LOW (ref 3.5–5.0)
Alkaline Phosphatase: 118 U/L (ref 38–126)
Anion gap: 8 (ref 5–15)
BUN: 24 mg/dL — ABNORMAL HIGH (ref 8–23)
CO2: 23 mmol/L (ref 22–32)
Calcium: 8.5 mg/dL — ABNORMAL LOW (ref 8.9–10.3)
Chloride: 106 mmol/L (ref 98–111)
Creatinine, Ser: 1.19 mg/dL — ABNORMAL HIGH (ref 0.44–1.00)
GFR calc Af Amer: 50 mL/min — ABNORMAL LOW (ref >60)
GFR calc non Af Amer: 43 mL/min — ABNORMAL LOW (ref >60)
Glucose, Bld: 238 mg/dL — ABNORMAL HIGH (ref 70–99)
Potassium: 4.3 mmol/L (ref 3.5–5.1)
Sodium: 137 mmol/L (ref 135–145)
Total Bilirubin: 0.5 mg/dL (ref 0.3–1.2)
Total Protein: 6.5 g/dL (ref 6.5–8.1)

## 2018-10-14 LAB — CBC WITH DIFFERENTIAL/PLATELET
Abs Immature Granulocytes: 0.01 10*3/uL (ref 0.00–0.07)
Basophils Absolute: 0 10*3/uL (ref 0.0–0.1)
Basophils Relative: 1 %
Eosinophils Absolute: 0.1 10*3/uL (ref 0.0–0.5)
Eosinophils Relative: 3 %
HCT: 30 % — ABNORMAL LOW (ref 36.0–46.0)
Hemoglobin: 9.5 g/dL — ABNORMAL LOW (ref 12.0–15.0)
Immature Granulocytes: 0 %
Lymphocytes Relative: 49 %
Lymphs Abs: 1.1 10*3/uL (ref 0.7–4.0)
MCH: 32 pg (ref 26.0–34.0)
MCHC: 31.7 g/dL (ref 30.0–36.0)
MCV: 101 fL — ABNORMAL HIGH (ref 80.0–100.0)
Monocytes Absolute: 0.1 10*3/uL (ref 0.1–1.0)
Monocytes Relative: 5 %
Neutro Abs: 0.9 10*3/uL — ABNORMAL LOW (ref 1.7–7.7)
Neutrophils Relative %: 42 %
Platelets: 118 10*3/uL — ABNORMAL LOW (ref 150–400)
RBC: 2.97 MIL/uL — ABNORMAL LOW (ref 3.87–5.11)
RDW: 17.7 % — ABNORMAL HIGH (ref 11.5–15.5)
WBC: 2.2 10*3/uL — ABNORMAL LOW (ref 4.0–10.5)
nRBC: 0 % (ref 0.0–0.2)

## 2018-10-14 NOTE — Progress Notes (Signed)
Hematology/Oncology Follow up note [facility] Telephone:(336) [phone] Fax:(336) [phone]   Patient Care Team: Glean Hess, MD as PCP - General (Internal Medicine)  REFERRING PROVIDER: Glean Hess, MD  Previous Oncologist Dr.Srujitha Murkutla REASON FOR VISIT Follow up for treatment of MDS  HISTORY OF PRESENTING ILLNESS:  Yesenia Hart is a  78 y.o.  female who recently moved from [place] to [place].  She used to live at [place] with husband.  Accompanied by daughter, [name] who is a respiratory therapist at [employer] [facility] [employer] [employer].  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  Patient presented to emergency room in January 2019 with complaints of intermittent rectal bleeding and increased to have a hemoglobin of 3 and platelet counts was 59,000.  Received 5 units of PRBC and was discharged home with hemoglobin of 10.  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  Cauterization was deferred due to thrombocytopenia. Aspirin was discontinued due to GI bleeding. Peripheral blood flow, SPEP negative, no obvious signs of B12 deficiency.  Ultrasound of the abdomen showed mild fatty liver.  On October 04, 2017, patient underwent bone marrow biopsy. Bone marrow core biopsy, bone marrow aspirate clot, bone marrow aspirate smears showed  single linage dysplasia.  Less than 5% bone marrow blasts and no circulating blast. Iron storage is nearly absent Peripheral blood smear showed moderate and anisopoikilocytosis Marrow aspirate normal in  adenoid maturation Cytogenetics performed at the [facility] in [place] showed 46,xx, t(3;17;16)(p21;q21;q24), add (5) (q11,2), idic(22)(p11,2)[18]/46,xx[2].  20 metaphases, lites were normal and 18 metaphases 5 q. deletion, it has translocation involving  chromosome of 3, 17, 16, and iso-di centric 22.  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  Patient received IV iron infusion on moving to [place].  She has not had any treatment for MDS done due to the move.  Today patient is accompanied by her daughter to the clinic.  She reports feeling tired, no weight loss.  She reports easy bruising.  Intermittent epistaxis which spontaneously resolved after applying pressure denies any hematochezia, hematemesis, hemoptysis.  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 Although patient does have 5q deletion which if isolated, is a good cytogentic group, she carried other cytogentic abnormalities which increases her IPSS-R score.   INTERVAL HISTORY Yesenia Hart is a 78 y.o. female who has above history reviewed by me today presents for assessment prior to MDS treatment with azacitidine.  She had lab done 2 weeks ago. Reports feeling well.  Cough is resolved.  Denies any fever, chills, sore throat, active bleeding events, cough, chest pain, abdominal pain or leg swelling. Chronic fatigue is at baseline.  Not better or worse.  Review of Systems  Constitutional: Positive for fatigue. Negative for appetite change, chills and fever.  HENT:   Negative for hearing loss and voice change.   Eyes: Negative for eye problems.  Respiratory: Negative for chest tightness and cough.   Cardiovascular: Negative for chest pain.  Gastrointestinal: Negative for abdominal distention, abdominal pain and blood in stool.  Endocrine: Negative for hot flashes.  Genitourinary: Negative for difficulty urinating and frequency.   Musculoskeletal: Negative for arthralgias.  Skin: Negative for itching and rash.  Neurological: Negative for extremity weakness.  Hematological: Negative for adenopathy.  Psychiatric/Behavioral: Negative for  confusion.    MEDICAL HISTORY:  Past  Medical History:  Diagnosis Date  . Anemia   . B12 deficiency 06/10/2018  . Clotting disorder ([place])   . Diabetes mellitus without complication ([place])   . IBS (irritable bowel syndrome)   . Iron deficiency anemia due to chronic blood loss 12/12/2017  . MDS (myelodysplastic syndrome) ([place])   . MDS (myelodysplastic syndrome) ([place])   . Thyroid disease     SURGICAL HISTORY: Past Surgical History:  Procedure Laterality Date  . APPENDECTOMY    . breast biopsy 95'    . CHOLECYSTECTOMY    . CORONARY ANGIOPLASTY WITH STENT PLACEMENT  2016   in [place]: Social History   Socioeconomic History  . Marital status: Married    Spouse name: Not on file  . Number of children: Not on file  . Years of education: Not on file  . Highest education level: Not on file  Occupational History  . Occupation: retired  Scientific laboratory technician  . Financial resource strain: Not on file  . Food insecurity:    Worry: Not on file    Inability: Not on file  . Transportation needs:    Medical: Not on file    Non-medical: Not on file  Tobacco Use  . Smoking status: Former Smoker    Packs/day: 1.00    Types: Cigarettes    Last attempt to quit: 1999    Years since quitting: 21.2  . Smokeless tobacco: Never Used  Substance and Sexual Activity  . Alcohol use: Not Currently  . Drug use: Never  . Sexual activity: Yes  Lifestyle  . Physical activity:    Days per week: Not on file    Minutes per session: Not on file  . Stress: Not on file  Relationships  . Social connections:    Talks on phone: Not on file    Gets together: Not on file    Attends religious service: Not on file    Active member of club or organization: Not on file    Attends meetings of clubs or organizations: Not on file    Relationship status: Not on file  . Intimate partner violence:    Fear of current or ex partner: Not on file    Emotionally abused: Not on file    Physically abused: Not on file    Forced sexual  activity: Not on file  Other Topics Concern  . Not on file  Social History Narrative  . Not on file    FAMILY HISTORY: Family History  Adopted: Yes  Family history unknown: Yes    ALLERGIES:  is allergic to acarbose; demerol [meperidine hcl]; meperidine; and pioglitazone.  MEDICATIONS:  Current Outpatient Medications  Medication Sig Dispense Refill  . atorvastatin (LIPITOR) 20 MG tablet Take 1 tablet (20 mg total) by mouth daily. 90 tablet 1  . azaCITIDine in lactated ringers infusion Inject 100 mg/m2 into the vein daily.    . furosemide (LASIX) 20 MG tablet TAKE 1 TABLET BY MOUTH ONCE DAILY 30 tablet 3  . glimepiride (AMARYL) 4 MG tablet TAKE 1 TABLET BY MOUTH ONCE DAILY 90 tablet 1  . Lancets (ONETOUCH ULTRASOFT) lancets Use as instructed 100 each 12  . levothyroxine (SYNTHROID, LEVOTHROID) 50 MCG tablet TAKE 1 TABLET BY MOUTH ONCE DAILY ON AN EMPTY STOMACH. WAIT 30 MINUTES BEFORE TAKING OTHER MEDS. 90 tablet 1  . NOVOLOG MIX 70/30 FLEXPEN (70-30) 100 UNIT/ML FlexPen Inject 0.1-0.15 mLs (10-15 Units  total) into the skin 2 (two) times daily with a meal. (Patient taking differently: Inject 8 Units into the skin 2 (two) times daily with a meal. ) 21 mL 1  . nystatin (MYCOSTATIN/NYSTOP) powder nystatin 100,000 unit/gram topical powder   1 app by topical route.    . ondansetron (ZOFRAN) 8 MG tablet TAKE 1 TABLET BY MOUTH TWICE DAILY AS NEEDED NAUSEA AND VOMITING 30 tablet 1  . ONE TOUCH ULTRA TEST test strip TEST TWICE DAILY 200 each 1  . pantoprazole (PROTONIX) 40 MG tablet Take 1 tablet (40 mg total) by mouth daily. 90 tablet 1  . quinapril (ACCUPRIL) 20 MG tablet Take 1 tablet (20 mg total) by mouth daily. 90 tablet 1  . sulfaSALAzine (AZULFIDINE) 500 MG EC tablet TAKE 1 TABLET BY MOUTH AT BEDTIME 90 tablet 1  . ULTICARE MICRO PEN NEEDLES 32G X 4 MM MISC Inject 1 each into the skin 2 (two) times daily. 100 each 12   No current facility-administered medications for this visit.       PHYSICAL EXAMINATION: ECOG PERFORMANCE STATUS: 1 - Symptomatic but completely ambulatory Vitals:   10/14/18 1510  BP: 124/69  Pulse: 69  Temp: 98.4 F (36.9 C)   Filed Weights   10/14/18 1510  Weight: 161 lb 3 oz (73.1 kg)    Physical Exam Constitutional:      General: She is not in acute distress. HENT:     Head: Normocephalic and atraumatic.  Eyes:     General: No scleral icterus.    Conjunctiva/sclera: Conjunctivae normal.     Pupils: Pupils are equal, round, and reactive to light.  Neck:     Musculoskeletal: Normal range of motion and neck supple.  Cardiovascular:     Rate and Rhythm: Normal rate and regular rhythm.     Heart sounds: Normal heart sounds.  Pulmonary:     Effort: Pulmonary effort is normal. No respiratory distress.     Breath sounds: Normal breath sounds. No wheezing or rales.  Chest:     Chest wall: No tenderness.  Abdominal:     General: Bowel sounds are normal. There is no distension.     Palpations: Abdomen is soft. There is no mass.     Tenderness: There is no abdominal tenderness.  Musculoskeletal: Normal range of motion.        General: No deformity.  Lymphadenopathy:     Cervical: No cervical adenopathy.  Skin:    General: Skin is warm and dry.     Findings: No erythema or rash.  Neurological:     Mental Status: She is alert and oriented to person, place, and time.     Cranial Nerves: No cranial nerve deficit.     Coordination: Coordination normal.  Psychiatric:        Behavior: Behavior normal.        Thought Content: Thought content normal.      LABORATORY DATA:  I have reviewed the data as listed Lab Results  Component Value Date   WBC 2.2 (L) 10/14/2018   HGB 9.5 (L) 10/14/2018   HCT 30.0 (L) 10/14/2018   MCV 101.0 (H) 10/14/2018   PLT 118 (L) 10/14/2018   Recent Labs    09/16/18 1258 09/30/18 1348 10/14/18 1303  NA 137 138 137  K 4.0 4.3 4.3  CL 107 105 106  CO2 23 24 23   GLUCOSE 188* 165* 238*  BUN 16 20  24*  CREATININE 1.16* 1.10* 1.19*  CALCIUM 8.3*  8.7* 8.5*  GFRNONAA 45* 48* 43*  GFRAA 52* 55* 50*  PROT 6.9 6.9 6.5  ALBUMIN 3.6 3.5 3.4*  AST 17 20 23   ALT 13 17 20   ALKPHOS 108 113 118  BILITOT 0.5 0.6 0.5       ASSESSMENT & PLAN:  1. 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 Encounter for antineoplastic chemotherapy   3. Thrombocytopenia ([place])   4. Neutropenia associated with infection ([place])     # MDS: She has tolerated azacitidine and responded to treatment very well. Good hematological response with improvement of platelet counts. Labs are reviewed and discussed with patient. She had developed thrombocytopenia, platelet counts were 51,000 in the middle of the treatment cycle. Platelet counts have recovered to 1 18,000.  Neutropenia, likely chemotherapy-induced, although not quite explainable why she had normal ANC during the middle of the treatment and have [place] of 0.  9 today. She denies any fever, chills, cough,  urinary symptoms.  Completely asymptomatic. Recommend patient to start using prophylactic Peridex oral rinse   I will hold Vidaza treatment this week due to neutropenia. Patient will repeat labs in 1 week, if lab results are meeting criteria, she will proceed with with either treatment next week. #History of vitamin B12 deficiency continue oral vitamin B12 supplements.  Repeat B12 at next visit. #Normocytic anemia, stable hemoglobin.  Stable.  Continue to monitor.  RTC  5  weeks for evaluation for next cycles of azacitidine.  Earlie Server, MD, PhD Hematology Oncology [facility] Pager- [phone] 10/14/2018

## 2018-10-15 ENCOUNTER — Inpatient Hospital Stay: Payer: Medicare Other

## 2018-10-16 ENCOUNTER — Inpatient Hospital Stay: Payer: Medicare Other

## 2018-10-17 ENCOUNTER — Inpatient Hospital Stay: Payer: Medicare Other

## 2018-10-18 ENCOUNTER — Inpatient Hospital Stay: Payer: Medicare Other

## 2018-10-20 ENCOUNTER — Other Ambulatory Visit: Payer: Self-pay

## 2018-10-21 ENCOUNTER — Inpatient Hospital Stay: Payer: Medicare Other

## 2018-10-21 ENCOUNTER — Other Ambulatory Visit: Payer: Self-pay

## 2018-10-21 ENCOUNTER — Other Ambulatory Visit: Payer: Self-pay | Admitting: Oncology

## 2018-10-21 VITALS — BP 132/73 | HR 84 | Temp 98.2°F | Resp 18

## 2018-10-21 DIAGNOSIS — R040 Epistaxis: Secondary | ICD-10-CM | POA: Diagnosis not present

## 2018-10-21 DIAGNOSIS — D46C Myelodysplastic syndrome with isolated del(5q) chromosomal abnormality: Secondary | ICD-10-CM

## 2018-10-21 DIAGNOSIS — R5383 Other fatigue: Secondary | ICD-10-CM | POA: Diagnosis not present

## 2018-10-21 DIAGNOSIS — D709 Neutropenia, unspecified: Secondary | ICD-10-CM | POA: Diagnosis not present

## 2018-10-21 DIAGNOSIS — E538 Deficiency of other specified B group vitamins: Secondary | ICD-10-CM | POA: Diagnosis not present

## 2018-10-21 DIAGNOSIS — Z79899 Other long term (current) drug therapy: Secondary | ICD-10-CM | POA: Diagnosis not present

## 2018-10-21 LAB — CBC WITH DIFFERENTIAL/PLATELET
Abs Immature Granulocytes: 0.02 10*3/uL (ref 0.00–0.07)
Basophils Absolute: 0 10*3/uL (ref 0.0–0.1)
Basophils Relative: 1 %
Eosinophils Absolute: 0.1 10*3/uL (ref 0.0–0.5)
Eosinophils Relative: 2 %
HCT: 32.6 % — ABNORMAL LOW (ref 36.0–46.0)
Hemoglobin: 10.2 g/dL — ABNORMAL LOW (ref 12.0–15.0)
Immature Granulocytes: 1 %
Lymphocytes Relative: 41 %
Lymphs Abs: 1.5 10*3/uL (ref 0.7–4.0)
MCH: 31.2 pg (ref 26.0–34.0)
MCHC: 31.3 g/dL (ref 30.0–36.0)
MCV: 99.7 fL (ref 80.0–100.0)
Monocytes Absolute: 0.4 10*3/uL (ref 0.1–1.0)
Monocytes Relative: 12 %
Neutro Abs: 1.6 10*3/uL — ABNORMAL LOW (ref 1.7–7.7)
Neutrophils Relative %: 43 %
Platelets: 98 10*3/uL — ABNORMAL LOW (ref 150–400)
RBC: 3.27 MIL/uL — ABNORMAL LOW (ref 3.87–5.11)
RDW: 17.6 % — ABNORMAL HIGH (ref 11.5–15.5)
WBC: 3.7 10*3/uL — ABNORMAL LOW (ref 4.0–10.5)
nRBC: 0 % (ref 0.0–0.2)

## 2018-10-21 LAB — COMPREHENSIVE METABOLIC PANEL
ALT: 17 U/L (ref 0–44)
AST: 17 U/L (ref 15–41)
Albumin: 3.5 g/dL (ref 3.5–5.0)
Alkaline Phosphatase: 113 U/L (ref 38–126)
Anion gap: 8 (ref 5–15)
BUN: 21 mg/dL (ref 8–23)
CO2: 21 mmol/L — ABNORMAL LOW (ref 22–32)
Calcium: 8.5 mg/dL — ABNORMAL LOW (ref 8.9–10.3)
Chloride: 106 mmol/L (ref 98–111)
Creatinine, Ser: 1.1 mg/dL — ABNORMAL HIGH (ref 0.44–1.00)
GFR calc Af Amer: 55 mL/min — ABNORMAL LOW (ref >60)
GFR calc non Af Amer: 48 mL/min — ABNORMAL LOW (ref >60)
Glucose, Bld: 179 mg/dL — ABNORMAL HIGH (ref 70–99)
Potassium: 4.4 mmol/L (ref 3.5–5.1)
Sodium: 135 mmol/L (ref 135–145)
Total Bilirubin: 0.6 mg/dL (ref 0.3–1.2)
Total Protein: 6.6 g/dL (ref 6.5–8.1)

## 2018-10-21 MED ORDER — ONDANSETRON HCL 4 MG PO TABS: 8.0000 mg | ORAL_TABLET | Freq: Once | ORAL | Status: DC

## 2018-10-21 MED ORDER — AZACITIDINE CHEMO SQ INJECTION
75.0000 mg/m2 | Freq: Once | INTRAMUSCULAR | Status: AC
  Administered 2018-10-21: 132.5 mg via SUBCUTANEOUS
  Filled 2018-10-21: qty 5.3

## 2018-10-21 NOTE — Progress Notes (Signed)
Dr. Tasia Catchings reviewed today's labs, proceed with Vidaza per Dr. Tasia Catchings

## 2018-10-22 ENCOUNTER — Other Ambulatory Visit: Payer: Self-pay

## 2018-10-22 ENCOUNTER — Inpatient Hospital Stay: Payer: Medicare Other

## 2018-10-22 VITALS — BP 146/69 | HR 82 | Temp 98.2°F | Resp 18 | Wt 164.0 lb

## 2018-10-22 DIAGNOSIS — D46C Myelodysplastic syndrome with isolated del(5q) chromosomal abnormality: Secondary | ICD-10-CM

## 2018-10-22 DIAGNOSIS — Z79899 Other long term (current) drug therapy: Secondary | ICD-10-CM | POA: Diagnosis not present

## 2018-10-22 DIAGNOSIS — R040 Epistaxis: Secondary | ICD-10-CM | POA: Diagnosis not present

## 2018-10-22 DIAGNOSIS — D709 Neutropenia, unspecified: Secondary | ICD-10-CM | POA: Diagnosis not present

## 2018-10-22 DIAGNOSIS — R5383 Other fatigue: Secondary | ICD-10-CM | POA: Diagnosis not present

## 2018-10-22 DIAGNOSIS — E538 Deficiency of other specified B group vitamins: Secondary | ICD-10-CM | POA: Diagnosis not present

## 2018-10-22 MED ORDER — ONDANSETRON HCL 4 MG PO TABS: 8.0000 mg | ORAL_TABLET | Freq: Once | ORAL | Status: DC

## 2018-10-22 MED ORDER — AZACITIDINE CHEMO SQ INJECTION
75.0000 mg/m2 | Freq: Once | INTRAMUSCULAR | Status: AC
  Administered 2018-10-22: 14:00:00 132.5 mg via SUBCUTANEOUS
  Filled 2018-10-22: qty 5.3

## 2018-10-23 ENCOUNTER — Inpatient Hospital Stay: Payer: Medicare Other

## 2018-10-23 ENCOUNTER — Other Ambulatory Visit: Payer: Self-pay | Admitting: Internal Medicine

## 2018-10-23 ENCOUNTER — Other Ambulatory Visit: Payer: Self-pay

## 2018-10-23 VITALS — BP 125/64 | HR 92 | Temp 98.2°F | Resp 20

## 2018-10-23 DIAGNOSIS — R5383 Other fatigue: Secondary | ICD-10-CM | POA: Diagnosis not present

## 2018-10-23 DIAGNOSIS — R040 Epistaxis: Secondary | ICD-10-CM | POA: Diagnosis not present

## 2018-10-23 DIAGNOSIS — Z79899 Other long term (current) drug therapy: Secondary | ICD-10-CM | POA: Diagnosis not present

## 2018-10-23 DIAGNOSIS — D46C Myelodysplastic syndrome with isolated del(5q) chromosomal abnormality: Secondary | ICD-10-CM | POA: Diagnosis not present

## 2018-10-23 DIAGNOSIS — D709 Neutropenia, unspecified: Secondary | ICD-10-CM | POA: Diagnosis not present

## 2018-10-23 DIAGNOSIS — E538 Deficiency of other specified B group vitamins: Secondary | ICD-10-CM | POA: Diagnosis not present

## 2018-10-23 MED ORDER — ONDANSETRON HCL 4 MG PO TABS: 8.0000 mg | ORAL_TABLET | Freq: Once | ORAL | Status: DC

## 2018-10-23 MED ORDER — AZACITIDINE CHEMO SQ INJECTION
75.0000 mg/m2 | Freq: Once | INTRAMUSCULAR | Status: AC
  Administered 2018-10-23: 14:00:00 132.5 mg via SUBCUTANEOUS
  Filled 2018-10-23: qty 5.3

## 2018-10-24 ENCOUNTER — Inpatient Hospital Stay: Payer: Medicare Other

## 2018-10-24 ENCOUNTER — Other Ambulatory Visit: Payer: Self-pay

## 2018-10-24 VITALS — BP 150/70 | HR 85 | Temp 98.2°F | Resp 20

## 2018-10-24 DIAGNOSIS — R040 Epistaxis: Secondary | ICD-10-CM | POA: Diagnosis not present

## 2018-10-24 DIAGNOSIS — D46C Myelodysplastic syndrome with isolated del(5q) chromosomal abnormality: Secondary | ICD-10-CM

## 2018-10-24 DIAGNOSIS — R5383 Other fatigue: Secondary | ICD-10-CM | POA: Diagnosis not present

## 2018-10-24 DIAGNOSIS — D709 Neutropenia, unspecified: Secondary | ICD-10-CM | POA: Diagnosis not present

## 2018-10-24 DIAGNOSIS — E538 Deficiency of other specified B group vitamins: Secondary | ICD-10-CM | POA: Diagnosis not present

## 2018-10-24 DIAGNOSIS — Z79899 Other long term (current) drug therapy: Secondary | ICD-10-CM | POA: Diagnosis not present

## 2018-10-24 MED ORDER — AZACITIDINE CHEMO SQ INJECTION
75.0000 mg/m2 | Freq: Once | INTRAMUSCULAR | Status: AC
  Administered 2018-10-24: 132.5 mg via SUBCUTANEOUS
  Filled 2018-10-24: qty 5.3

## 2018-10-25 ENCOUNTER — Inpatient Hospital Stay: Payer: Medicare Other

## 2018-10-25 ENCOUNTER — Other Ambulatory Visit: Payer: Self-pay

## 2018-10-25 VITALS — BP 138/66 | HR 84 | Temp 98.2°F | Resp 20

## 2018-10-25 DIAGNOSIS — R040 Epistaxis: Secondary | ICD-10-CM | POA: Diagnosis not present

## 2018-10-25 DIAGNOSIS — D46C Myelodysplastic syndrome with isolated del(5q) chromosomal abnormality: Secondary | ICD-10-CM

## 2018-10-25 DIAGNOSIS — R5383 Other fatigue: Secondary | ICD-10-CM | POA: Diagnosis not present

## 2018-10-25 DIAGNOSIS — E538 Deficiency of other specified B group vitamins: Secondary | ICD-10-CM | POA: Diagnosis not present

## 2018-10-25 DIAGNOSIS — Z79899 Other long term (current) drug therapy: Secondary | ICD-10-CM | POA: Diagnosis not present

## 2018-10-25 DIAGNOSIS — D709 Neutropenia, unspecified: Secondary | ICD-10-CM | POA: Diagnosis not present

## 2018-10-25 MED ORDER — AZACITIDINE CHEMO SQ INJECTION
75.0000 mg/m2 | Freq: Once | INTRAMUSCULAR | Status: AC
  Administered 2018-10-25: 14:00:00 132.5 mg via SUBCUTANEOUS
  Filled 2018-10-25: qty 5.3

## 2018-10-25 NOTE — Progress Notes (Signed)
Patient states she took Zofran at home prior to her visit today.

## 2018-10-28 ENCOUNTER — Other Ambulatory Visit: Payer: Self-pay

## 2018-10-28 ENCOUNTER — Inpatient Hospital Stay: Payer: Medicare Other

## 2018-10-28 DIAGNOSIS — Z79899 Other long term (current) drug therapy: Secondary | ICD-10-CM | POA: Diagnosis not present

## 2018-10-28 DIAGNOSIS — D709 Neutropenia, unspecified: Secondary | ICD-10-CM | POA: Diagnosis not present

## 2018-10-28 DIAGNOSIS — E538 Deficiency of other specified B group vitamins: Secondary | ICD-10-CM | POA: Diagnosis not present

## 2018-10-28 DIAGNOSIS — D46C Myelodysplastic syndrome with isolated del(5q) chromosomal abnormality: Secondary | ICD-10-CM

## 2018-10-28 DIAGNOSIS — R5383 Other fatigue: Secondary | ICD-10-CM | POA: Diagnosis not present

## 2018-10-28 DIAGNOSIS — R040 Epistaxis: Secondary | ICD-10-CM | POA: Diagnosis not present

## 2018-10-28 LAB — CBC WITH DIFFERENTIAL/PLATELET
Abs Immature Granulocytes: 0.02 10*3/uL (ref 0.00–0.07)
Basophils Absolute: 0 10*3/uL (ref 0.0–0.1)
Basophils Relative: 0 %
Eosinophils Absolute: 0.1 10*3/uL (ref 0.0–0.5)
Eosinophils Relative: 3 %
HCT: 32.1 % — ABNORMAL LOW (ref 36.0–46.0)
Hemoglobin: 10.2 g/dL — ABNORMAL LOW (ref 12.0–15.0)
Immature Granulocytes: 1 %
Lymphocytes Relative: 32 %
Lymphs Abs: 1.3 10*3/uL (ref 0.7–4.0)
MCH: 31.3 pg (ref 26.0–34.0)
MCHC: 31.8 g/dL (ref 30.0–36.0)
MCV: 98.5 fL (ref 80.0–100.0)
Monocytes Absolute: 0.2 10*3/uL (ref 0.1–1.0)
Monocytes Relative: 5 %
Neutro Abs: 2.4 10*3/uL (ref 1.7–7.7)
Neutrophils Relative %: 59 %
Platelets: 65 10*3/uL — ABNORMAL LOW (ref 150–400)
RBC: 3.26 MIL/uL — ABNORMAL LOW (ref 3.87–5.11)
RDW: 17 % — ABNORMAL HIGH (ref 11.5–15.5)
WBC: 4 10*3/uL (ref 4.0–10.5)
nRBC: 0 % (ref 0.0–0.2)

## 2018-10-28 LAB — COMPREHENSIVE METABOLIC PANEL
ALT: 25 U/L (ref 0–44)
AST: 27 U/L (ref 15–41)
Albumin: 3.6 g/dL (ref 3.5–5.0)
Alkaline Phosphatase: 133 U/L — ABNORMAL HIGH (ref 38–126)
Anion gap: 9 (ref 5–15)
BUN: 26 mg/dL — ABNORMAL HIGH (ref 8–23)
CO2: 25 mmol/L (ref 22–32)
Calcium: 8.8 mg/dL — ABNORMAL LOW (ref 8.9–10.3)
Chloride: 103 mmol/L (ref 98–111)
Creatinine, Ser: 1.35 mg/dL — ABNORMAL HIGH (ref 0.44–1.00)
GFR calc Af Amer: 43 mL/min — ABNORMAL LOW (ref >60)
GFR calc non Af Amer: 37 mL/min — ABNORMAL LOW (ref >60)
Glucose, Bld: 241 mg/dL — ABNORMAL HIGH (ref 70–99)
Potassium: 4.4 mmol/L (ref 3.5–5.1)
Sodium: 137 mmol/L (ref 135–145)
Total Bilirubin: 0.5 mg/dL (ref 0.3–1.2)
Total Protein: 7.1 g/dL (ref 6.5–8.1)

## 2018-11-04 ENCOUNTER — Other Ambulatory Visit: Payer: Self-pay

## 2018-11-04 ENCOUNTER — Other Ambulatory Visit: Payer: Self-pay | Admitting: Oncology

## 2018-11-04 ENCOUNTER — Inpatient Hospital Stay: Payer: Medicare Other

## 2018-11-04 DIAGNOSIS — D46C Myelodysplastic syndrome with isolated del(5q) chromosomal abnormality: Secondary | ICD-10-CM

## 2018-11-04 DIAGNOSIS — R040 Epistaxis: Secondary | ICD-10-CM | POA: Diagnosis not present

## 2018-11-04 DIAGNOSIS — D709 Neutropenia, unspecified: Secondary | ICD-10-CM | POA: Diagnosis not present

## 2018-11-04 DIAGNOSIS — D696 Thrombocytopenia, unspecified: Secondary | ICD-10-CM

## 2018-11-04 DIAGNOSIS — Z79899 Other long term (current) drug therapy: Secondary | ICD-10-CM | POA: Diagnosis not present

## 2018-11-04 DIAGNOSIS — R5383 Other fatigue: Secondary | ICD-10-CM | POA: Diagnosis not present

## 2018-11-04 DIAGNOSIS — E538 Deficiency of other specified B group vitamins: Secondary | ICD-10-CM | POA: Diagnosis not present

## 2018-11-04 LAB — CBC WITH DIFFERENTIAL/PLATELET
Abs Immature Granulocytes: 0.01 10*3/uL (ref 0.00–0.07)
Basophils Absolute: 0 10*3/uL (ref 0.0–0.1)
Basophils Relative: 0 %
Eosinophils Absolute: 0.1 10*3/uL (ref 0.0–0.5)
Eosinophils Relative: 2 %
HCT: 30.7 % — ABNORMAL LOW (ref 36.0–46.0)
Hemoglobin: 9.8 g/dL — ABNORMAL LOW (ref 12.0–15.0)
Immature Granulocytes: 0 %
Lymphocytes Relative: 31 %
Lymphs Abs: 1.3 10*3/uL (ref 0.7–4.0)
MCH: 31.6 pg (ref 26.0–34.0)
MCHC: 31.9 g/dL (ref 30.0–36.0)
MCV: 99 fL (ref 80.0–100.0)
Monocytes Absolute: 0.3 10*3/uL (ref 0.1–1.0)
Monocytes Relative: 8 %
Neutro Abs: 2.4 10*3/uL (ref 1.7–7.7)
Neutrophils Relative %: 59 %
Platelets: 45 10*3/uL — ABNORMAL LOW (ref 150–400)
RBC: 3.1 MIL/uL — ABNORMAL LOW (ref 3.87–5.11)
RDW: 17.7 % — ABNORMAL HIGH (ref 11.5–15.5)
WBC: 4.1 10*3/uL (ref 4.0–10.5)
nRBC: 0 % (ref 0.0–0.2)

## 2018-11-04 LAB — COMPREHENSIVE METABOLIC PANEL
ALT: 15 U/L (ref 0–44)
AST: 19 U/L (ref 15–41)
Albumin: 3.4 g/dL — ABNORMAL LOW (ref 3.5–5.0)
Alkaline Phosphatase: 119 U/L (ref 38–126)
Anion gap: 8 (ref 5–15)
BUN: 25 mg/dL — ABNORMAL HIGH (ref 8–23)
CO2: 23 mmol/L (ref 22–32)
Calcium: 8.4 mg/dL — ABNORMAL LOW (ref 8.9–10.3)
Chloride: 105 mmol/L (ref 98–111)
Creatinine, Ser: 1.15 mg/dL — ABNORMAL HIGH (ref 0.44–1.00)
GFR calc Af Amer: 52 mL/min — ABNORMAL LOW (ref >60)
GFR calc non Af Amer: 45 mL/min — ABNORMAL LOW (ref >60)
Glucose, Bld: 205 mg/dL — ABNORMAL HIGH (ref 70–99)
Potassium: 4.4 mmol/L (ref 3.5–5.1)
Sodium: 136 mmol/L (ref 135–145)
Total Bilirubin: 0.6 mg/dL (ref 0.3–1.2)
Total Protein: 6.3 g/dL — ABNORMAL LOW (ref 6.5–8.1)

## 2018-11-10 ENCOUNTER — Other Ambulatory Visit: Payer: Self-pay

## 2018-11-11 ENCOUNTER — Other Ambulatory Visit: Payer: Self-pay

## 2018-11-11 ENCOUNTER — Inpatient Hospital Stay: Payer: Medicare Other | Attending: Oncology

## 2018-11-11 ENCOUNTER — Inpatient Hospital Stay: Payer: Medicare Other

## 2018-11-11 DIAGNOSIS — N183 Chronic kidney disease, stage 3 (moderate): Secondary | ICD-10-CM | POA: Diagnosis not present

## 2018-11-11 DIAGNOSIS — R197 Diarrhea, unspecified: Secondary | ICD-10-CM | POA: Diagnosis not present

## 2018-11-11 DIAGNOSIS — Z5111 Encounter for antineoplastic chemotherapy: Secondary | ICD-10-CM | POA: Insufficient documentation

## 2018-11-11 DIAGNOSIS — E079 Disorder of thyroid, unspecified: Secondary | ICD-10-CM | POA: Insufficient documentation

## 2018-11-11 DIAGNOSIS — Z87891 Personal history of nicotine dependence: Secondary | ICD-10-CM | POA: Insufficient documentation

## 2018-11-11 DIAGNOSIS — E119 Type 2 diabetes mellitus without complications: Secondary | ICD-10-CM | POA: Diagnosis not present

## 2018-11-11 DIAGNOSIS — I129 Hypertensive chronic kidney disease with stage 1 through stage 4 chronic kidney disease, or unspecified chronic kidney disease: Secondary | ICD-10-CM | POA: Diagnosis not present

## 2018-11-11 DIAGNOSIS — D696 Thrombocytopenia, unspecified: Secondary | ICD-10-CM

## 2018-11-11 DIAGNOSIS — D46C Myelodysplastic syndrome with isolated del(5q) chromosomal abnormality: Secondary | ICD-10-CM

## 2018-11-11 DIAGNOSIS — E538 Deficiency of other specified B group vitamins: Secondary | ICD-10-CM | POA: Insufficient documentation

## 2018-11-11 DIAGNOSIS — Z79899 Other long term (current) drug therapy: Secondary | ICD-10-CM | POA: Diagnosis not present

## 2018-11-11 LAB — CBC
HCT: 29.1 % — ABNORMAL LOW (ref 36.0–46.0)
Hemoglobin: 9.1 g/dL — ABNORMAL LOW (ref 12.0–15.0)
MCH: 31.3 pg (ref 26.0–34.0)
MCHC: 31.3 g/dL (ref 30.0–36.0)
MCV: 100 fL (ref 80.0–100.0)
Platelets: 76 10*3/uL — ABNORMAL LOW (ref 150–400)
RBC: 2.91 MIL/uL — ABNORMAL LOW (ref 3.87–5.11)
RDW: 18.3 % — ABNORMAL HIGH (ref 11.5–15.5)
WBC: 2.4 10*3/uL — ABNORMAL LOW (ref 4.0–10.5)
nRBC: 0.8 % — ABNORMAL HIGH (ref 0.0–0.2)

## 2018-11-18 ENCOUNTER — Other Ambulatory Visit: Payer: Self-pay

## 2018-11-18 ENCOUNTER — Ambulatory Visit: Payer: Medicare Other

## 2018-11-18 ENCOUNTER — Inpatient Hospital Stay: Payer: Medicare Other

## 2018-11-18 ENCOUNTER — Inpatient Hospital Stay (HOSPITAL_BASED_OUTPATIENT_CLINIC_OR_DEPARTMENT_OTHER): Payer: Medicare Other | Admitting: Internal Medicine

## 2018-11-18 VITALS — BP 121/77 | HR 72 | Temp 98.1°F | Resp 20 | Ht 64.0 in | Wt 160.9 lb

## 2018-11-18 DIAGNOSIS — Z5111 Encounter for antineoplastic chemotherapy: Secondary | ICD-10-CM | POA: Diagnosis not present

## 2018-11-18 DIAGNOSIS — E538 Deficiency of other specified B group vitamins: Secondary | ICD-10-CM | POA: Diagnosis not present

## 2018-11-18 DIAGNOSIS — R197 Diarrhea, unspecified: Secondary | ICD-10-CM

## 2018-11-18 DIAGNOSIS — E119 Type 2 diabetes mellitus without complications: Secondary | ICD-10-CM | POA: Diagnosis not present

## 2018-11-18 DIAGNOSIS — D46C Myelodysplastic syndrome with isolated del(5q) chromosomal abnormality: Secondary | ICD-10-CM | POA: Diagnosis not present

## 2018-11-18 DIAGNOSIS — I129 Hypertensive chronic kidney disease with stage 1 through stage 4 chronic kidney disease, or unspecified chronic kidney disease: Secondary | ICD-10-CM

## 2018-11-18 DIAGNOSIS — Z87891 Personal history of nicotine dependence: Secondary | ICD-10-CM

## 2018-11-18 DIAGNOSIS — N183 Chronic kidney disease, stage 3 (moderate): Secondary | ICD-10-CM

## 2018-11-18 DIAGNOSIS — E079 Disorder of thyroid, unspecified: Secondary | ICD-10-CM

## 2018-11-18 DIAGNOSIS — D46Z Other myelodysplastic syndromes: Secondary | ICD-10-CM | POA: Insufficient documentation

## 2018-11-18 DIAGNOSIS — Z79899 Other long term (current) drug therapy: Secondary | ICD-10-CM

## 2018-11-18 LAB — COMPREHENSIVE METABOLIC PANEL
ALT: 12 U/L (ref 0–44)
AST: 18 U/L (ref 15–41)
Albumin: 3.6 g/dL (ref 3.5–5.0)
Alkaline Phosphatase: 104 U/L (ref 38–126)
Anion gap: 10 (ref 5–15)
BUN: 23 mg/dL (ref 8–23)
CO2: 22 mmol/L (ref 22–32)
Calcium: 8.5 mg/dL — ABNORMAL LOW (ref 8.9–10.3)
Chloride: 108 mmol/L (ref 98–111)
Creatinine, Ser: 1.38 mg/dL — ABNORMAL HIGH (ref 0.44–1.00)
GFR calc Af Amer: 42 mL/min — ABNORMAL LOW (ref >60)
GFR calc non Af Amer: 36 mL/min — ABNORMAL LOW (ref >60)
Glucose, Bld: 142 mg/dL — ABNORMAL HIGH (ref 70–99)
Potassium: 4.1 mmol/L (ref 3.5–5.1)
Sodium: 140 mmol/L (ref 135–145)
Total Bilirubin: 0.4 mg/dL (ref 0.3–1.2)
Total Protein: 6.6 g/dL (ref 6.5–8.1)

## 2018-11-18 LAB — CBC WITH DIFFERENTIAL/PLATELET
Abs Immature Granulocytes: 0.01 10*3/uL (ref 0.00–0.07)
Basophils Absolute: 0 10*3/uL (ref 0.0–0.1)
Basophils Relative: 1 %
Eosinophils Absolute: 0.1 10*3/uL (ref 0.0–0.5)
Eosinophils Relative: 2 %
HCT: 31.5 % — ABNORMAL LOW (ref 36.0–46.0)
Hemoglobin: 9.8 g/dL — ABNORMAL LOW (ref 12.0–15.0)
Immature Granulocytes: 0 %
Lymphocytes Relative: 50 %
Lymphs Abs: 1.2 10*3/uL (ref 0.7–4.0)
MCH: 31.1 pg (ref 26.0–34.0)
MCHC: 31.1 g/dL (ref 30.0–36.0)
MCV: 100 fL (ref 80.0–100.0)
Monocytes Absolute: 0.2 10*3/uL (ref 0.1–1.0)
Monocytes Relative: 6 %
Neutro Abs: 1 10*3/uL — ABNORMAL LOW (ref 1.7–7.7)
Neutrophils Relative %: 41 %
Platelets: 150 10*3/uL (ref 150–400)
RBC: 3.15 MIL/uL — ABNORMAL LOW (ref 3.87–5.11)
RDW: 18.5 % — ABNORMAL HIGH (ref 11.5–15.5)
WBC: 2.5 10*3/uL — ABNORMAL LOW (ref 4.0–10.5)
nRBC: 0 % (ref 0.0–0.2)

## 2018-11-18 MED ORDER — AZACITIDINE CHEMO SQ INJECTION
75.0000 mg/m2 | Freq: Once | INTRAMUSCULAR | Status: AC
  Administered 2018-11-18: 132.5 mg via SUBCUTANEOUS
  Filled 2018-11-18: qty 5.3

## 2018-11-18 NOTE — Progress Notes (Signed)
[facility] OFFICE PROGRESS NOTE  Patient Care Team: Glean Hess, MD as PCP - General (Internal Medicine)  Cancer Staging No matching staging information was found for the patient.   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5 mg (05/10/2018), 132.5 mg (02/11/2018), 132.5 mg (03/12/2018), 132.5 mg (01/16/2018), 132.5 mg (01/17/2018), 132.5 mg (02/12/2018), 132.5 mg (02/13/2018), 132.5 mg (02/14/2018), 132.5 mg (02/15/2018), 132.5 mg (03/14/2018), 132.5 mg (03/15/2018), 132.5 mg (03/18/2018), 132.5 mg (04/08/2018), 132.5 mg (04/11/2018), 132.5 mg (04/12/2018), 132.5 mg (06/10/2018), 132.5 mg (06/11/2018), 132.5 mg (06/12/2018), 132.5 mg (06/13/2018), 132.5 mg (06/14/2018), 132.5 mg (07/15/2018), 132.5 mg (07/16/2018), 132.5 mg (07/17/2018), 132.5 mg (07/18/2018), 132.5 mg (07/19/2018), 132.5 mg (09/16/2018), 132.5 mg (09/17/2018), 132.5 mg (09/18/2018), 132.5 mg (09/19/2018), 132.5 mg (09/20/2018), 132.5 mg (08/12/2018), 132.5 mg (08/13/2018), 132.5 mg (08/14/2018), 132.5 mg (08/15/2018), 132.5 mg (08/16/2018), 132.5 mg (11/18/2018), 132.5 mg (10/21/2018), 132.5 mg (10/22/2018), 132.5 mg (10/23/2018), 132.5 mg  (10/24/2018), 132.5 mg (10/25/2018)  for chemotherapy treatment.      MDS (myelodysplastic syndrome), high grade ([place])      INTERVAL HISTORY:  Yesenia Hart 78 y.o.  female pleasant patient above history of high-grade MDS currently on Vidaza every 4 weeks is here for follow-up.  Patient admits to mild diarrhea 1-2 loose stools a day.  None currently.  Appetite is good.  No weight loss.  No fevers or chills.  Chronic mild easy bruising otherwise no nosebleeds or gum bleeding.  Mild fatigue.  Review of Systems  Constitutional: Positive for malaise/fatigue. Negative for chills, diaphoresis, fever and weight loss.  HENT: Negative for nosebleeds and sore throat.   Eyes: Negative for double vision.  Respiratory: Negative for cough, hemoptysis, sputum production, shortness of breath and wheezing.   Cardiovascular: Negative for chest pain, palpitations, orthopnea and leg swelling.  Gastrointestinal: Negative for abdominal pain, blood in stool, constipation, diarrhea, heartburn, melena, nausea and vomiting.  Genitourinary: Negative for dysuria, frequency and urgency.  Musculoskeletal: Negative for back pain and joint pain.  Skin: Negative.  Negative for itching and rash.  Neurological: Negative for dizziness, tingling, focal weakness, weakness and headaches.  Endo/Heme/Allergies: Bruises/bleeds easily.  Psychiatric/Behavioral: Negative for depression. The patient is not nervous/anxious and does not have insomnia.       PAST MEDICAL HISTORY :  Past Medical History:  Diagnosis Date  . Anemia   . B12 deficiency 06/10/2018  . Clotting disorder ([place])   . Diabetes mellitus without complication ([place])   . IBS (irritable bowel syndrome)   . Iron deficiency anemia due to chronic blood loss 12/12/2017  . MDS (myelodysplastic syndrome) ([place])   . MDS (myelodysplastic syndrome) ([place])   . Thyroid disease     PAST SURGICAL  HISTORY :   Past Surgical History:  Procedure Laterality Date  .  APPENDECTOMY    . breast biopsy 13'    . CHOLECYSTECTOMY    . CORONARY ANGIOPLASTY WITH STENT PLACEMENT  2016   in [place] HISTORY :   Family History  Adopted: Yes  Family history unknown: Yes    SOCIAL HISTORY:   Social History   Tobacco Use  . Smoking status: Former Smoker    Packs/day: 1.00    Types: Cigarettes    Last attempt to quit: 1999    Years since quitting: 21.3  . Smokeless tobacco: Never Used  Substance Use Topics  . Alcohol use: Not Currently  . Drug use: Never    ALLERGIES:  is allergic to acarbose; demerol [meperidine hcl]; meperidine; and pioglitazone.  MEDICATIONS:  Current Outpatient Medications  Medication Sig Dispense Refill  . atorvastatin (LIPITOR) 20 MG tablet Take 1 tablet (20 mg total) by mouth daily. 90 tablet 1  . azaCITIDine in lactated ringers infusion Inject 100 mg/m2 into the vein daily.    . furosemide (LASIX) 20 MG tablet TAKE 1 TABLET BY MOUTH ONCE DAILY 30 tablet 3  . glimepiride (AMARYL) 4 MG tablet TAKE 1 TABLET BY MOUTH ONCE DAILY 90 tablet 1  . Lancets (ONETOUCH ULTRASOFT) lancets Use as instructed 100 each 12  . levothyroxine (SYNTHROID, LEVOTHROID) 50 MCG tablet TAKE 1 TABLET BY MOUTH ONCE DAILY ON AN EMPTY STOMACH. WAIT 30 MINUTES BEFORE TAKING OTHER MEDS. 90 tablet 1  . loperamide (IMODIUM) 2 MG capsule Take 2 mg by mouth as needed for diarrhea or loose stools.    Marland Kitchen NOVOLOG MIX 70/30 FLEXPEN (70-30) 100 UNIT/ML FlexPen Inject 0.1-0.15 mLs (10-15 Units total) into the skin 2 (two) times daily with a meal. (Patient taking differently: Inject 8 Units into the skin 2 (two) times daily with a meal. ) 21 mL 1  . nystatin (MYCOSTATIN/NYSTOP) powder nystatin 100,000 unit/gram topical powder   1 app by topical route.    . ondansetron (ZOFRAN) 8 MG tablet TAKE 1 TABLET BY MOUTH TWICE DAILY AS NEEDED NAUSEA AND VOMITING 30 tablet 1  . ONE TOUCH ULTRA TEST test strip TEST TWICE DAILY 200 each 1  . pantoprazole (PROTONIX) 40 MG  tablet Take 1 tablet (40 mg total) by mouth daily. 90 tablet 1  . quinapril (ACCUPRIL) 20 MG tablet Take 1 tablet (20 mg total) by mouth daily. 90 tablet 1  . sulfaSALAzine (AZULFIDINE) 500 MG EC tablet TAKE 1 TABLET BY MOUTH AT BEDTIME 90 tablet 1  . ULTICARE MICRO PEN NEEDLES 32G X 4 MM MISC Inject 1 each into the skin 2 (two) times daily. 100 each 12   No current facility-administered medications for this visit.     PHYSICAL EXAMINATION: ECOG PERFORMANCE STATUS: 0 - Asymptomatic  BP 121/77   Pulse 72   Temp 98.1 F (36.7 C) (Tympanic)   Resp 20   Ht 5' 4"  (1.626 m)   Wt 160 lb 14.4 oz (73 kg)   BMI 27.62 kg/m   Filed Weights   11/18/18 1424  Weight: 160 lb 14.4 oz (73 kg)    Physical Exam  Constitutional: She is oriented to person, place, and time and well-developed, well-nourished, and in no distress.  HENT:  Head: Normocephalic and atraumatic.  Mouth/Throat: Oropharynx is clear and moist. No oropharyngeal exudate.  Eyes: Pupils are equal, round, and reactive to light.  Neck: Normal range of motion. Neck supple.  Cardiovascular:  Normal rate and regular rhythm.  Pulmonary/Chest: No respiratory distress. She has no wheezes.  Abdominal: Soft. Bowel sounds are normal. She exhibits no distension and no mass. There is no abdominal tenderness. There is no rebound and no guarding.  Musculoskeletal: Normal range of motion.        General: No tenderness or edema.  Neurological: She is alert and oriented to person, place, and time.  Skin: Skin is warm.  Psychiatric: Affect normal.       LABORATORY DATA:  I have reviewed the data as listed    Component Value Date/Time   NA 140 11/18/2018 1354   K 4.1 11/18/2018 1354   CL 108 11/18/2018 1354   CO2 22 11/18/2018 1354   GLUCOSE 142 (H) 11/18/2018 1354   BUN 23 11/18/2018 1354   CREATININE 1.38 (H) 11/18/2018 1354   CALCIUM 8.5 (L) 11/18/2018 1354   PROT 6.6 11/18/2018 1354   ALBUMIN 3.6 11/18/2018 1354   AST 18  11/18/2018 1354   ALT 12 11/18/2018 1354   ALKPHOS 104 11/18/2018 1354   BILITOT 0.4 11/18/2018 1354   GFRNONAA 36 (L) 11/18/2018 1354   GFRAA 42 (L) 11/18/2018 1354    No results found for: SPEP, UPEP  Lab Results  Component Value Date   WBC 2.5 (L) 11/18/2018   NEUTROABS 1.0 (L) 11/18/2018   HGB 9.8 (L) 11/18/2018   HCT 31.5 (L) 11/18/2018   MCV 100.0 11/18/2018   PLT 150 11/18/2018      Chemistry      Component Value Date/Time   NA 140 11/18/2018 1354   K 4.1 11/18/2018 1354   CL 108 11/18/2018 1354   CO2 22 11/18/2018 1354   BUN 23 11/18/2018 1354   CREATININE 1.38 (H) 11/18/2018 1354      Component Value Date/Time   CALCIUM 8.5 (L) 11/18/2018 1354   ALKPHOS 104 11/18/2018 1354   AST 18 11/18/2018 1354   ALT 12 11/18/2018 1354   BILITOT 0.4 11/18/2018 1354       RADIOGRAPHIC STUDIES: I have personally reviewed the radiological images as listed and agreed with the findings in the report. No results found.   ASSESSMENT & PLAN:  MDS (myelodysplastic syndrome), high grade ([place]) # high grade MDS-currently on Vidaza since June 2019.  Good hematologic recovery white count 2.5 ANC 1.0 [intermittent]; hemoglobin around 9 platelets 150.  Continue Vidaza.  Discussed the need for repeat bone marrow biopsy in the next few months to evaluate the disease status.  # CKD- stage III-creatinine 1.3 stable.  Continue adequate fluid intake.  # Diarrhea- G-1. Monitor for now.   # DISPOSITION: # Weekly cbc # follow up in 4 weeks-Dr.Yu [if not Dr.Rao]/ SQ Vidaza M-F- Dr.B   No orders of the defined types were placed in this encounter.  All questions were answered. The patient knows to call the clinic with any problems, questions or concerns.      Cammie Sickle, MD 11/18/2018 4:18 PM

## 2018-11-18 NOTE — Assessment & Plan Note (Addendum)
#  high grade MDS-currently on Vidaza since June 2019.  Good hematologic recovery white count 2.5 ANC 1.0 [intermittent]; hemoglobin around 9 platelets 150.  Continue Vidaza.  Discussed the need for repeat bone marrow biopsy in the next few months to evaluate the disease status.  # CKD- stage III-creatinine 1.3 stable.  Continue adequate fluid intake.  # Diarrhea- G-1. Monitor for now.   # DISPOSITION: # Weekly cbc # follow up in 4 weeks-Dr.Yu [if not Dr.Rao]/ SQ Vidaza M-F- Dr.B

## 2018-11-19 ENCOUNTER — Other Ambulatory Visit: Payer: Self-pay

## 2018-11-19 ENCOUNTER — Inpatient Hospital Stay: Payer: Medicare Other

## 2018-11-19 VITALS — BP 106/66 | HR 76 | Resp 20

## 2018-11-19 DIAGNOSIS — D46Z Other myelodysplastic syndromes: Secondary | ICD-10-CM

## 2018-11-19 DIAGNOSIS — I129 Hypertensive chronic kidney disease with stage 1 through stage 4 chronic kidney disease, or unspecified chronic kidney disease: Secondary | ICD-10-CM | POA: Diagnosis not present

## 2018-11-19 DIAGNOSIS — Z5111 Encounter for antineoplastic chemotherapy: Secondary | ICD-10-CM | POA: Diagnosis not present

## 2018-11-19 DIAGNOSIS — D46C Myelodysplastic syndrome with isolated del(5q) chromosomal abnormality: Secondary | ICD-10-CM | POA: Diagnosis not present

## 2018-11-19 DIAGNOSIS — N183 Chronic kidney disease, stage 3 (moderate): Secondary | ICD-10-CM | POA: Diagnosis not present

## 2018-11-19 DIAGNOSIS — E538 Deficiency of other specified B group vitamins: Secondary | ICD-10-CM | POA: Diagnosis not present

## 2018-11-19 DIAGNOSIS — R197 Diarrhea, unspecified: Secondary | ICD-10-CM | POA: Diagnosis not present

## 2018-11-19 MED ORDER — AZACITIDINE CHEMO SQ INJECTION
75.0000 mg/m2 | Freq: Once | INTRAMUSCULAR | Status: AC
  Administered 2018-11-19: 132.5 mg via SUBCUTANEOUS
  Filled 2018-11-19: qty 5.3

## 2018-11-20 ENCOUNTER — Other Ambulatory Visit: Payer: Self-pay

## 2018-11-20 ENCOUNTER — Inpatient Hospital Stay: Payer: Medicare Other

## 2018-11-20 VITALS — BP 149/64 | HR 86 | Temp 98.0°F | Resp 19

## 2018-11-20 DIAGNOSIS — R197 Diarrhea, unspecified: Secondary | ICD-10-CM | POA: Diagnosis not present

## 2018-11-20 DIAGNOSIS — N183 Chronic kidney disease, stage 3 (moderate): Secondary | ICD-10-CM | POA: Diagnosis not present

## 2018-11-20 DIAGNOSIS — D46C Myelodysplastic syndrome with isolated del(5q) chromosomal abnormality: Secondary | ICD-10-CM | POA: Diagnosis not present

## 2018-11-20 DIAGNOSIS — Z5111 Encounter for antineoplastic chemotherapy: Secondary | ICD-10-CM | POA: Diagnosis not present

## 2018-11-20 DIAGNOSIS — E538 Deficiency of other specified B group vitamins: Secondary | ICD-10-CM | POA: Diagnosis not present

## 2018-11-20 DIAGNOSIS — D46Z Other myelodysplastic syndromes: Secondary | ICD-10-CM

## 2018-11-20 DIAGNOSIS — I129 Hypertensive chronic kidney disease with stage 1 through stage 4 chronic kidney disease, or unspecified chronic kidney disease: Secondary | ICD-10-CM | POA: Diagnosis not present

## 2018-11-20 MED ORDER — AZACITIDINE CHEMO SQ INJECTION
75.0000 mg/m2 | Freq: Once | INTRAMUSCULAR | Status: AC
  Administered 2018-11-20: 132.5 mg via SUBCUTANEOUS
  Filled 2018-11-20: qty 5.3

## 2018-11-21 ENCOUNTER — Inpatient Hospital Stay: Payer: Medicare Other

## 2018-11-21 ENCOUNTER — Other Ambulatory Visit: Payer: Self-pay

## 2018-11-21 VITALS — BP 144/67 | HR 85 | Temp 98.2°F | Resp 20

## 2018-11-21 DIAGNOSIS — D46C Myelodysplastic syndrome with isolated del(5q) chromosomal abnormality: Secondary | ICD-10-CM | POA: Diagnosis not present

## 2018-11-21 DIAGNOSIS — R197 Diarrhea, unspecified: Secondary | ICD-10-CM | POA: Diagnosis not present

## 2018-11-21 DIAGNOSIS — Z5111 Encounter for antineoplastic chemotherapy: Secondary | ICD-10-CM | POA: Diagnosis not present

## 2018-11-21 DIAGNOSIS — E538 Deficiency of other specified B group vitamins: Secondary | ICD-10-CM | POA: Diagnosis not present

## 2018-11-21 DIAGNOSIS — D46Z Other myelodysplastic syndromes: Secondary | ICD-10-CM

## 2018-11-21 DIAGNOSIS — N183 Chronic kidney disease, stage 3 (moderate): Secondary | ICD-10-CM | POA: Diagnosis not present

## 2018-11-21 DIAGNOSIS — I129 Hypertensive chronic kidney disease with stage 1 through stage 4 chronic kidney disease, or unspecified chronic kidney disease: Secondary | ICD-10-CM | POA: Diagnosis not present

## 2018-11-21 MED ORDER — AZACITIDINE CHEMO SQ INJECTION
75.0000 mg/m2 | Freq: Once | INTRAMUSCULAR | Status: AC
  Administered 2018-11-21: 132.5 mg via SUBCUTANEOUS
  Filled 2018-11-21: qty 5.3

## 2018-11-21 MED ORDER — ONDANSETRON HCL 4 MG PO TABS: 8.0000 mg | ORAL_TABLET | Freq: Once | ORAL | Status: DC

## 2018-11-22 ENCOUNTER — Other Ambulatory Visit: Payer: Self-pay

## 2018-11-22 ENCOUNTER — Ambulatory Visit (INDEPENDENT_AMBULATORY_CARE_PROVIDER_SITE_OTHER): Payer: Medicare Other | Admitting: Internal Medicine

## 2018-11-22 ENCOUNTER — Inpatient Hospital Stay: Payer: Medicare Other

## 2018-11-22 ENCOUNTER — Encounter: Payer: Self-pay | Admitting: Internal Medicine

## 2018-11-22 VITALS — BP 124/68 | HR 84 | Ht 64.0 in | Wt 161.6 lb

## 2018-11-22 VITALS — BP 149/50 | HR 84 | Temp 98.0°F | Resp 20

## 2018-11-22 DIAGNOSIS — D46Z Other myelodysplastic syndromes: Secondary | ICD-10-CM | POA: Diagnosis not present

## 2018-11-22 DIAGNOSIS — I129 Hypertensive chronic kidney disease with stage 1 through stage 4 chronic kidney disease, or unspecified chronic kidney disease: Secondary | ICD-10-CM | POA: Diagnosis not present

## 2018-11-22 DIAGNOSIS — Z794 Long term (current) use of insulin: Secondary | ICD-10-CM | POA: Diagnosis not present

## 2018-11-22 DIAGNOSIS — E538 Deficiency of other specified B group vitamins: Secondary | ICD-10-CM | POA: Diagnosis not present

## 2018-11-22 DIAGNOSIS — E1169 Type 2 diabetes mellitus with other specified complication: Secondary | ICD-10-CM

## 2018-11-22 DIAGNOSIS — E119 Type 2 diabetes mellitus without complications: Secondary | ICD-10-CM | POA: Diagnosis not present

## 2018-11-22 DIAGNOSIS — I10 Essential (primary) hypertension: Secondary | ICD-10-CM | POA: Diagnosis not present

## 2018-11-22 DIAGNOSIS — E785 Hyperlipidemia, unspecified: Secondary | ICD-10-CM | POA: Diagnosis not present

## 2018-11-22 DIAGNOSIS — E034 Atrophy of thyroid (acquired): Secondary | ICD-10-CM

## 2018-11-22 DIAGNOSIS — Z5111 Encounter for antineoplastic chemotherapy: Secondary | ICD-10-CM | POA: Diagnosis not present

## 2018-11-22 DIAGNOSIS — D46C Myelodysplastic syndrome with isolated del(5q) chromosomal abnormality: Secondary | ICD-10-CM | POA: Diagnosis not present

## 2018-11-22 DIAGNOSIS — N183 Chronic kidney disease, stage 3 (moderate): Secondary | ICD-10-CM | POA: Diagnosis not present

## 2018-11-22 DIAGNOSIS — R197 Diarrhea, unspecified: Secondary | ICD-10-CM | POA: Diagnosis not present

## 2018-11-22 MED ORDER — AZACITIDINE CHEMO SQ INJECTION
75.0000 mg/m2 | Freq: Once | INTRAMUSCULAR | Status: AC
  Administered 2018-11-22: 14:00:00 132.5 mg via SUBCUTANEOUS
  Filled 2018-11-22: qty 5.3

## 2018-11-22 NOTE — Progress Notes (Signed)
Date:  11/22/2018   Name:  Yesenia Hart   DOB:  1941-04-12   MRN:  [id number]   Chief Complaint: Diabetes and Hypertension  Diabetes  She presents for her follow-up diabetic visit. She has type 2 diabetes mellitus. Her disease course has been stable. Pertinent negatives for hypoglycemia include no headaches or tremors. Associated symptoms include fatigue. Pertinent negatives for diabetes include no chest pain, no polydipsia and no polyuria. Symptoms are stable. Current diabetic treatment includes oral agent (monotherapy) and insulin injections (glimepiride and Novolog 70/30). She monitors blood glucose at home 1-2 x per day. There is no change in her home blood glucose trend. Her breakfast blood glucose is taken between 8-9 am. Her breakfast blood glucose range is generally 130-140 mg/dl. An ACE inhibitor/angiotensin II receptor blocker is being taken.  Hypertension  The problem is unchanged. Pertinent negatives include no chest pain, headaches, palpitations or shortness of breath. Past treatments include ACE inhibitors. Identifiable causes of hypertension include a thyroid problem.  Hyperlipidemia  The problem is controlled. Pertinent negatives include no chest pain or shortness of breath. Current antihyperlipidemic treatment includes statins. The current treatment provides significant improvement of lipids. There are no compliance problems.   Thyroid Problem  Presents for follow-up visit. Symptoms include diarrhea and fatigue. Patient reports no palpitations or tremors. Her past medical history is significant for hyperlipidemia.  MDS - being followed closely by Oncology and has active treatment ongoing with Vidaza infusions.  Lab Results  Component Value Date   HGBA1C 6.2 (H) 08/12/2018   Lab Results  Component Value Date   TSH 1.806 12/24/2017   Lab Results  Component Value Date   CHOL 101 03/15/2018   HDL 64 03/15/2018   LDLCALC 26 03/15/2018   TRIG 57 03/15/2018   CHOLHDL  1.6 03/15/2018   Lab Results  Component Value Date   CREATININE 1.38 (H) 11/18/2018   BUN 23 11/18/2018   NA 140 11/18/2018   K 4.1 11/18/2018   CL 108 11/18/2018   CO2 22 11/18/2018   Lab Results  Component Value Date   WBC 2.5 (L) 11/18/2018   HGB 9.8 (L) 11/18/2018   HCT 31.5 (L) 11/18/2018   MCV 100.0 11/18/2018   PLT 150 11/18/2018   Lab Results  Component Value Date   VITAMINB12 790 08/12/2018      Review of Systems  Constitutional: Positive for fatigue. Negative for appetite change, fever and unexpected weight change.  HENT: Negative for tinnitus and trouble swallowing.   Eyes: Negative for visual disturbance.  Respiratory: Negative for cough, chest tightness and shortness of breath.   Cardiovascular: Negative for chest pain, palpitations and leg swelling.  Gastrointestinal: Positive for diarrhea. Negative for abdominal pain, blood in stool and nausea.  Endocrine: Negative for polydipsia and polyuria.  Genitourinary: Negative for dysuria and hematuria.  Musculoskeletal: Positive for back pain. Negative for arthralgias and joint swelling.  Neurological: Negative for tremors, numbness and headaches.  Hematological: Bruises/bleeds easily.  Psychiatric/Behavioral: Negative for dysphoric mood and sleep disturbance.    Patient Active Problem List   Diagnosis Date Noted  . MDS (myelodysplastic syndrome), high grade ([place]) 11/18/2018  . Gastroesophageal reflux disease 07/19/2018  . B12 deficiency 06/10/2018  . 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 CAD (coronary artery disease), native coronary artery 03/15/2018  . Drug-induced neutropenia ([place]) 01/14/2018  . Type 2 diabetes mellitus without complication, with long-term current use of insulin ([place]) 12/17/2017  . Fever blister 12/17/2017  .  Tobacco use disorder, moderate, in sustained remission 12/17/2017  . Thrombocytopenia ([place]) 12/12/2017  . Goals of care, counseling/discussion 12/12/2017   . Other fatigue 12/12/2017  . Asthma 11/30/2017  . Cataract 11/30/2017  . Chronic obstructive lung disease ([place]) 11/30/2017  . 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 Edema 11/30/2017  . Essential hypertension 11/30/2017  . Hyperlipidemia associated with type 2 diabetes mellitus ([place]) 11/30/2017  . Osteoarthritis 11/30/2017  . 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 Acarbose Diarrhea  . Demerol [Meperidine Hcl]   . Meperidine Nausea And Vomiting  . Pioglitazone     Other reaction(s): Edema    Past Surgical History:  Procedure Laterality Date  . APPENDECTOMY    . breast biopsy 61'    . CHOLECYSTECTOMY    . CORONARY ANGIOPLASTY WITH STENT PLACEMENT  2016   in [place] History   Tobacco Use  . Smoking status: Former Smoker    Packs/day: 1.00    Types: Cigarettes    Last attempt to quit: 1999    Years since quitting: 21.3  . Smokeless tobacco: Never Used  Substance Use Topics  . Alcohol use: Not Currently  . Drug use: Never     Medication list has been reviewed and updated.  Current Meds  Medication Sig  . atorvastatin (LIPITOR) 20 MG tablet Take 1 tablet (20 mg total) by mouth daily.  Marland Kitchen azaCITIDine in lactated ringers infusion Inject 100 mg/m2 into the vein daily.  . furosemide (LASIX) 20 MG tablet TAKE 1 TABLET BY MOUTH ONCE DAILY  . glimepiride (AMARYL) 4 MG tablet TAKE 1 TABLET BY MOUTH ONCE DAILY  . Lancets (ONETOUCH ULTRASOFT) lancets Use as instructed  . levothyroxine (SYNTHROID, LEVOTHROID) 50 MCG tablet TAKE 1 TABLET BY MOUTH ONCE DAILY ON AN EMPTY STOMACH. WAIT 30 MINUTES BEFORE TAKING OTHER MEDS.  Marland Kitchen loperamide (IMODIUM) 2 MG capsule Take 2 mg by mouth as needed for diarrhea or loose stools.  Marland Kitchen NOVOLOG MIX 70/30 FLEXPEN (70-30) 100 UNIT/ML FlexPen Inject 0.1-0.15 mLs (10-15 Units total) into the skin 2 (two) times daily with a meal. (Patient  taking differently: Inject 8 Units into the skin 2 (two) times daily with a meal. )  . nystatin (MYCOSTATIN/NYSTOP) powder nystatin 100,000 unit/gram topical powder   1 app by topical route.  . ondansetron (ZOFRAN) 8 MG tablet TAKE 1 TABLET BY MOUTH TWICE DAILY AS NEEDED NAUSEA AND VOMITING  . ONE TOUCH ULTRA TEST test strip TEST TWICE DAILY  . pantoprazole (PROTONIX) 40 MG tablet Take 1 tablet (40 mg total) by mouth daily.  . quinapril (ACCUPRIL) 20 MG tablet Take 1 tablet (20 mg total) by mouth daily.  Marland Kitchen sulfaSALAzine (AZULFIDINE) 500 MG EC tablet TAKE 1 TABLET BY MOUTH AT BEDTIME  . ULTICARE MICRO PEN NEEDLES 32G X 4 MM MISC Inject 1 each into the skin 2 (two) times daily.    PHQ 2/9 Scores 11/22/2018 07/19/2018 12/17/2017  PHQ - 2 Score 2 0 0  PHQ- 9 Score 2 - -    BP Readings from Last 3 Encounters:  11/22/18 124/68  11/21/18 (!) 144/67  11/20/18 (!) 149/64    Physical Exam Vitals signs and nursing note reviewed.  Constitutional:      General: She is not in acute distress.    Appearance: She is well-developed.  HENT:     Head: Normocephalic and atraumatic.  Mouth/Throat:     Mouth: Mucous membranes are moist.  Eyes:     Pupils: Pupils are equal, round, and reactive to light.  Neck:     Musculoskeletal: Normal range of motion and neck supple.     Vascular: No carotid bruit.  Cardiovascular:     Rate and Rhythm: Normal rate and regular rhythm.     Pulses: Normal pulses.     Heart sounds: No murmur.  Pulmonary:     Effort: Pulmonary effort is normal. No respiratory distress.     Breath sounds: Normal breath sounds. No wheezing or rhonchi.  Abdominal:     General: There is no distension.     Palpations: Abdomen is soft.     Tenderness: There is no abdominal tenderness.  Musculoskeletal: Normal range of motion.     Right lower leg: No edema.     Left lower leg: No edema.  Lymphadenopathy:     Cervical: No cervical adenopathy.  Skin:    General: Skin is warm and  dry.     Capillary Refill: Capillary refill takes less than 2 seconds.     Findings: No rash.  Neurological:     Mental Status: She is alert and oriented to person, place, and time.  Psychiatric:        Behavior: Behavior normal.        Thought Content: Thought content normal.     Wt Readings from Last 3 Encounters:  11/22/18 161 lb 9.6 oz (73.3 kg)  11/18/18 160 lb 14.4 oz (73 kg)  10/22/18 164 lb (74.4 kg)    BP 124/68   Pulse 84   Ht 5' 4"  (1.626 m)   Wt 161 lb 9.6 oz (73.3 kg)   SpO2 94%   BMI 27.74 kg/m   Assessment and Plan: 1. Essential hypertension Controlled   2. Type 2 diabetes mellitus without complication, with long-term current use of insulin ([facility]) Doing well on insulin and glimepiride - Hemoglobin A1c; Future  3. Hyperlipidemia associated with type 2 diabetes mellitus ([place]) On statin therapy - Lipid panel; Future  4. 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 MDS (myelodysplastic syndrome), high grade ([facility]) On monthly Vidaza injections Mild diarrhea side effects but otherwise doing well  Today's labs ordered for future to be drawn with oncology labs.  Partially dictated using Editor, commissioning. Any errors are unintentional.  Halina Maidens, MD [facility]  11/22/2018

## 2018-11-25 ENCOUNTER — Other Ambulatory Visit: Payer: Self-pay

## 2018-11-25 ENCOUNTER — Inpatient Hospital Stay: Payer: Medicare Other

## 2018-11-25 DIAGNOSIS — Z5111 Encounter for antineoplastic chemotherapy: Secondary | ICD-10-CM | POA: Diagnosis not present

## 2018-11-25 DIAGNOSIS — I129 Hypertensive chronic kidney disease with stage 1 through stage 4 chronic kidney disease, or unspecified chronic kidney disease: Secondary | ICD-10-CM | POA: Diagnosis not present

## 2018-11-25 DIAGNOSIS — E538 Deficiency of other specified B group vitamins: Secondary | ICD-10-CM | POA: Diagnosis not present

## 2018-11-25 DIAGNOSIS — D46C Myelodysplastic syndrome with isolated del(5q) chromosomal abnormality: Secondary | ICD-10-CM | POA: Diagnosis not present

## 2018-11-25 DIAGNOSIS — E785 Hyperlipidemia, unspecified: Secondary | ICD-10-CM

## 2018-11-25 DIAGNOSIS — R197 Diarrhea, unspecified: Secondary | ICD-10-CM | POA: Diagnosis not present

## 2018-11-25 DIAGNOSIS — N183 Chronic kidney disease, stage 3 (moderate): Secondary | ICD-10-CM | POA: Diagnosis not present

## 2018-11-25 DIAGNOSIS — E1169 Type 2 diabetes mellitus with other specified complication: Secondary | ICD-10-CM

## 2018-11-25 LAB — COMPREHENSIVE METABOLIC PANEL
ALT: 14 U/L (ref 0–44)
AST: 18 U/L (ref 15–41)
Albumin: 3.6 g/dL (ref 3.5–5.0)
Alkaline Phosphatase: 107 U/L (ref 38–126)
Anion gap: 8 (ref 5–15)
BUN: 18 mg/dL (ref 8–23)
CO2: 26 mmol/L (ref 22–32)
Calcium: 8.7 mg/dL — ABNORMAL LOW (ref 8.9–10.3)
Chloride: 103 mmol/L (ref 98–111)
Creatinine, Ser: 1.13 mg/dL — ABNORMAL HIGH (ref 0.44–1.00)
GFR calc Af Amer: 54 mL/min — ABNORMAL LOW (ref >60)
GFR calc non Af Amer: 46 mL/min — ABNORMAL LOW (ref >60)
Glucose, Bld: 169 mg/dL — ABNORMAL HIGH (ref 70–99)
Potassium: 3.9 mmol/L (ref 3.5–5.1)
Sodium: 137 mmol/L (ref 135–145)
Total Bilirubin: 0.6 mg/dL (ref 0.3–1.2)
Total Protein: 6.8 g/dL (ref 6.5–8.1)

## 2018-11-25 LAB — CBC WITH DIFFERENTIAL/PLATELET
Abs Immature Granulocytes: 0.01 10*3/uL (ref 0.00–0.07)
Basophils Absolute: 0 10*3/uL (ref 0.0–0.1)
Basophils Relative: 1 %
Eosinophils Absolute: 0.1 10*3/uL (ref 0.0–0.5)
Eosinophils Relative: 3 %
HCT: 29.1 % — ABNORMAL LOW (ref 36.0–46.0)
Hemoglobin: 9.5 g/dL — ABNORMAL LOW (ref 12.0–15.0)
Immature Granulocytes: 1 %
Lymphocytes Relative: 41 %
Lymphs Abs: 0.9 10*3/uL (ref 0.7–4.0)
MCH: 31.9 pg (ref 26.0–34.0)
MCHC: 32.6 g/dL (ref 30.0–36.0)
MCV: 97.7 fL (ref 80.0–100.0)
Monocytes Absolute: 0.2 10*3/uL (ref 0.1–1.0)
Monocytes Relative: 8 %
Neutro Abs: 1 10*3/uL — ABNORMAL LOW (ref 1.7–7.7)
Neutrophils Relative %: 46 %
Platelets: 91 10*3/uL — ABNORMAL LOW (ref 150–400)
RBC: 2.98 MIL/uL — ABNORMAL LOW (ref 3.87–5.11)
RDW: 17.9 % — ABNORMAL HIGH (ref 11.5–15.5)
WBC: 2.2 10*3/uL — ABNORMAL LOW (ref 4.0–10.5)
nRBC: 0 % (ref 0.0–0.2)

## 2018-11-25 LAB — LIPID PANEL
Cholesterol: 101 mg/dL (ref 0–200)
HDL: 53 mg/dL (ref >40)
LDL Cholesterol: 36 mg/dL (ref 0–99)
Total CHOL/HDL Ratio: 1.9 RATIO
Triglycerides: 59 mg/dL (ref <150)
VLDL: 12 mg/dL (ref 0–40)

## 2018-11-25 LAB — HEMOGLOBIN A1C
Hgb A1c MFr Bld: 6 % — ABNORMAL HIGH (ref 4.8–5.6)
Mean Plasma Glucose: 125.5 mg/dL

## 2018-11-25 LAB — TSH: TSH: 1.492 u[IU]/mL (ref 0.350–4.500)

## 2018-11-27 ENCOUNTER — Other Ambulatory Visit: Payer: Self-pay | Admitting: Internal Medicine

## 2018-12-03 ENCOUNTER — Other Ambulatory Visit: Payer: Self-pay

## 2018-12-03 ENCOUNTER — Inpatient Hospital Stay: Payer: Medicare Other

## 2018-12-03 DIAGNOSIS — E538 Deficiency of other specified B group vitamins: Secondary | ICD-10-CM | POA: Diagnosis not present

## 2018-12-03 DIAGNOSIS — D46C Myelodysplastic syndrome with isolated del(5q) chromosomal abnormality: Secondary | ICD-10-CM | POA: Diagnosis not present

## 2018-12-03 DIAGNOSIS — Z5111 Encounter for antineoplastic chemotherapy: Secondary | ICD-10-CM | POA: Diagnosis not present

## 2018-12-03 DIAGNOSIS — R197 Diarrhea, unspecified: Secondary | ICD-10-CM | POA: Diagnosis not present

## 2018-12-03 DIAGNOSIS — I129 Hypertensive chronic kidney disease with stage 1 through stage 4 chronic kidney disease, or unspecified chronic kidney disease: Secondary | ICD-10-CM | POA: Diagnosis not present

## 2018-12-03 DIAGNOSIS — N183 Chronic kidney disease, stage 3 (moderate): Secondary | ICD-10-CM | POA: Diagnosis not present

## 2018-12-03 LAB — CBC WITH DIFFERENTIAL/PLATELET
Abs Immature Granulocytes: 0.02 10*3/uL (ref 0.00–0.07)
Basophils Absolute: 0 10*3/uL (ref 0.0–0.1)
Basophils Relative: 0 %
Eosinophils Absolute: 0.1 10*3/uL (ref 0.0–0.5)
Eosinophils Relative: 4 %
HCT: 28.4 % — ABNORMAL LOW (ref 36.0–46.0)
Hemoglobin: 9 g/dL — ABNORMAL LOW (ref 12.0–15.0)
Immature Granulocytes: 1 %
Lymphocytes Relative: 44 %
Lymphs Abs: 1.4 10*3/uL (ref 0.7–4.0)
MCH: 31 pg (ref 26.0–34.0)
MCHC: 31.7 g/dL (ref 30.0–36.0)
MCV: 97.9 fL (ref 80.0–100.0)
Monocytes Absolute: 0.2 10*3/uL (ref 0.1–1.0)
Monocytes Relative: 7 %
Neutro Abs: 1.4 10*3/uL — ABNORMAL LOW (ref 1.7–7.7)
Neutrophils Relative %: 44 %
Platelets: 44 10*3/uL — ABNORMAL LOW (ref 150–400)
RBC: 2.9 MIL/uL — ABNORMAL LOW (ref 3.87–5.11)
RDW: 18.6 % — ABNORMAL HIGH (ref 11.5–15.5)
WBC: 3.1 10*3/uL — ABNORMAL LOW (ref 4.0–10.5)
nRBC: 0 % (ref 0.0–0.2)

## 2018-12-03 LAB — COMPREHENSIVE METABOLIC PANEL
ALT: 14 U/L (ref 0–44)
AST: 18 U/L (ref 15–41)
Albumin: 3.6 g/dL (ref 3.5–5.0)
Alkaline Phosphatase: 103 U/L (ref 38–126)
Anion gap: 9 (ref 5–15)
BUN: 25 mg/dL — ABNORMAL HIGH (ref 8–23)
CO2: 23 mmol/L (ref 22–32)
Calcium: 8.6 mg/dL — ABNORMAL LOW (ref 8.9–10.3)
Chloride: 108 mmol/L (ref 98–111)
Creatinine, Ser: 1.12 mg/dL — ABNORMAL HIGH (ref 0.44–1.00)
GFR calc Af Amer: 54 mL/min — ABNORMAL LOW (ref >60)
GFR calc non Af Amer: 47 mL/min — ABNORMAL LOW (ref >60)
Glucose, Bld: 119 mg/dL — ABNORMAL HIGH (ref 70–99)
Potassium: 4.2 mmol/L (ref 3.5–5.1)
Sodium: 140 mmol/L (ref 135–145)
Total Bilirubin: 0.7 mg/dL (ref 0.3–1.2)
Total Protein: 6.4 g/dL — ABNORMAL LOW (ref 6.5–8.1)

## 2018-12-04 ENCOUNTER — Other Ambulatory Visit: Payer: Self-pay | Admitting: Internal Medicine

## 2018-12-04 DIAGNOSIS — I10 Essential (primary) hypertension: Secondary | ICD-10-CM

## 2018-12-09 ENCOUNTER — Other Ambulatory Visit: Payer: Self-pay

## 2018-12-09 ENCOUNTER — Inpatient Hospital Stay: Payer: Medicare Other | Attending: Oncology

## 2018-12-09 DIAGNOSIS — Z79899 Other long term (current) drug therapy: Secondary | ICD-10-CM | POA: Insufficient documentation

## 2018-12-09 DIAGNOSIS — E119 Type 2 diabetes mellitus without complications: Secondary | ICD-10-CM | POA: Diagnosis not present

## 2018-12-09 DIAGNOSIS — T451X5A Adverse effect of antineoplastic and immunosuppressive drugs, initial encounter: Secondary | ICD-10-CM | POA: Diagnosis not present

## 2018-12-09 DIAGNOSIS — D46C Myelodysplastic syndrome with isolated del(5q) chromosomal abnormality: Secondary | ICD-10-CM | POA: Insufficient documentation

## 2018-12-09 DIAGNOSIS — E538 Deficiency of other specified B group vitamins: Secondary | ICD-10-CM | POA: Insufficient documentation

## 2018-12-09 DIAGNOSIS — D649 Anemia, unspecified: Secondary | ICD-10-CM | POA: Diagnosis not present

## 2018-12-09 DIAGNOSIS — Z7984 Long term (current) use of oral hypoglycemic drugs: Secondary | ICD-10-CM | POA: Insufficient documentation

## 2018-12-09 DIAGNOSIS — E079 Disorder of thyroid, unspecified: Secondary | ICD-10-CM | POA: Insufficient documentation

## 2018-12-09 DIAGNOSIS — K580 Irritable bowel syndrome with diarrhea: Secondary | ICD-10-CM | POA: Diagnosis not present

## 2018-12-09 DIAGNOSIS — Z87891 Personal history of nicotine dependence: Secondary | ICD-10-CM | POA: Insufficient documentation

## 2018-12-09 DIAGNOSIS — N183 Chronic kidney disease, stage 3 (moderate): Secondary | ICD-10-CM | POA: Insufficient documentation

## 2018-12-09 DIAGNOSIS — D701 Agranulocytosis secondary to cancer chemotherapy: Secondary | ICD-10-CM | POA: Insufficient documentation

## 2018-12-09 LAB — CBC WITH DIFFERENTIAL/PLATELET
Abs Immature Granulocytes: 0.09 10*3/uL — ABNORMAL HIGH (ref 0.00–0.07)
Basophils Absolute: 0 10*3/uL (ref 0.0–0.1)
Basophils Relative: 1 %
Eosinophils Absolute: 0.1 10*3/uL (ref 0.0–0.5)
Eosinophils Relative: 3 %
HCT: 30.6 % — ABNORMAL LOW (ref 36.0–46.0)
Hemoglobin: 9.6 g/dL — ABNORMAL LOW (ref 12.0–15.0)
Immature Granulocytes: 4 %
Lymphocytes Relative: 59 %
Lymphs Abs: 1.4 10*3/uL (ref 0.7–4.0)
MCH: 31.1 pg (ref 26.0–34.0)
MCHC: 31.4 g/dL (ref 30.0–36.0)
MCV: 99 fL (ref 80.0–100.0)
Monocytes Absolute: 0.1 10*3/uL (ref 0.1–1.0)
Monocytes Relative: 2 %
Neutro Abs: 0.7 10*3/uL — ABNORMAL LOW (ref 1.7–7.7)
Neutrophils Relative %: 31 %
Platelets: 82 10*3/uL — ABNORMAL LOW (ref 150–400)
RBC: 3.09 MIL/uL — ABNORMAL LOW (ref 3.87–5.11)
RDW: 19.3 % — ABNORMAL HIGH (ref 11.5–15.5)
WBC: 2.4 10*3/uL — ABNORMAL LOW (ref 4.0–10.5)
nRBC: 0 % (ref 0.0–0.2)

## 2018-12-09 LAB — COMPREHENSIVE METABOLIC PANEL
ALT: 12 U/L (ref 0–44)
AST: 18 U/L (ref 15–41)
Albumin: 3.7 g/dL (ref 3.5–5.0)
Alkaline Phosphatase: 111 U/L (ref 38–126)
Anion gap: 8 (ref 5–15)
BUN: 20 mg/dL (ref 8–23)
CO2: 24 mmol/L (ref 22–32)
Calcium: 8.7 mg/dL — ABNORMAL LOW (ref 8.9–10.3)
Chloride: 107 mmol/L (ref 98–111)
Creatinine, Ser: 1.29 mg/dL — ABNORMAL HIGH (ref 0.44–1.00)
GFR calc Af Amer: 46 mL/min — ABNORMAL LOW (ref >60)
GFR calc non Af Amer: 39 mL/min — ABNORMAL LOW (ref >60)
Glucose, Bld: 141 mg/dL — ABNORMAL HIGH (ref 70–99)
Potassium: 4.5 mmol/L (ref 3.5–5.1)
Sodium: 139 mmol/L (ref 135–145)
Total Bilirubin: 0.5 mg/dL (ref 0.3–1.2)
Total Protein: 6.7 g/dL (ref 6.5–8.1)

## 2018-12-16 ENCOUNTER — Inpatient Hospital Stay: Payer: Medicare Other

## 2018-12-16 ENCOUNTER — Encounter: Payer: Self-pay | Admitting: Oncology

## 2018-12-16 ENCOUNTER — Other Ambulatory Visit: Payer: Self-pay

## 2018-12-16 ENCOUNTER — Inpatient Hospital Stay (HOSPITAL_BASED_OUTPATIENT_CLINIC_OR_DEPARTMENT_OTHER): Payer: Medicare Other | Admitting: Oncology

## 2018-12-16 VITALS — BP 118/68 | HR 70 | Temp 96.8°F | Resp 18 | Wt 162.2 lb

## 2018-12-16 DIAGNOSIS — D619 Aplastic anemia, unspecified: Secondary | ICD-10-CM | POA: Diagnosis not present

## 2018-12-16 DIAGNOSIS — E538 Deficiency of other specified B group vitamins: Secondary | ICD-10-CM

## 2018-12-16 DIAGNOSIS — N183 Chronic kidney disease, stage 3 unspecified: Secondary | ICD-10-CM

## 2018-12-16 DIAGNOSIS — D46C Myelodysplastic syndrome with isolated del(5q) chromosomal abnormality: Secondary | ICD-10-CM

## 2018-12-16 DIAGNOSIS — Z87891 Personal history of nicotine dependence: Secondary | ICD-10-CM | POA: Diagnosis not present

## 2018-12-16 DIAGNOSIS — E119 Type 2 diabetes mellitus without complications: Secondary | ICD-10-CM

## 2018-12-16 DIAGNOSIS — Z79899 Other long term (current) drug therapy: Secondary | ICD-10-CM | POA: Diagnosis not present

## 2018-12-16 DIAGNOSIS — E079 Disorder of thyroid, unspecified: Secondary | ICD-10-CM | POA: Diagnosis not present

## 2018-12-16 DIAGNOSIS — D649 Anemia, unspecified: Secondary | ICD-10-CM | POA: Diagnosis not present

## 2018-12-16 DIAGNOSIS — D6189 Other specified aplastic anemias and other bone marrow failure syndromes: Secondary | ICD-10-CM

## 2018-12-16 DIAGNOSIS — Z7984 Long term (current) use of oral hypoglycemic drugs: Secondary | ICD-10-CM | POA: Diagnosis not present

## 2018-12-16 DIAGNOSIS — K580 Irritable bowel syndrome with diarrhea: Secondary | ICD-10-CM

## 2018-12-16 DIAGNOSIS — D701 Agranulocytosis secondary to cancer chemotherapy: Secondary | ICD-10-CM | POA: Diagnosis not present

## 2018-12-16 DIAGNOSIS — D703 Neutropenia due to infection: Secondary | ICD-10-CM

## 2018-12-16 LAB — CBC WITH DIFFERENTIAL/PLATELET
Abs Immature Granulocytes: 0 10*3/uL (ref 0.00–0.07)
Basophils Absolute: 0 10*3/uL (ref 0.0–0.1)
Basophils Relative: 2 %
Eosinophils Absolute: 0.1 10*3/uL (ref 0.0–0.5)
Eosinophils Relative: 6 %
HCT: 30.4 % — ABNORMAL LOW (ref 36.0–46.0)
Hemoglobin: 9.2 g/dL — ABNORMAL LOW (ref 12.0–15.0)
Immature Granulocytes: 0 %
Lymphocytes Relative: 64 %
Lymphs Abs: 1 10*3/uL (ref 0.7–4.0)
MCH: 30.4 pg (ref 26.0–34.0)
MCHC: 30.3 g/dL (ref 30.0–36.0)
MCV: 100.3 fL — ABNORMAL HIGH (ref 80.0–100.0)
Monocytes Absolute: 0.1 10*3/uL (ref 0.1–1.0)
Monocytes Relative: 6 %
Neutro Abs: 0.4 10*3/uL — ABNORMAL LOW (ref 1.7–7.7)
Neutrophils Relative %: 22 %
Platelets: 144 10*3/uL — ABNORMAL LOW (ref 150–400)
RBC: 3.03 MIL/uL — ABNORMAL LOW (ref 3.87–5.11)
RDW: 19.8 % — ABNORMAL HIGH (ref 11.5–15.5)
Smear Review: ADEQUATE
WBC: 1.6 10*3/uL — ABNORMAL LOW (ref 4.0–10.5)
nRBC: 0 % (ref 0.0–0.2)

## 2018-12-16 LAB — COMPREHENSIVE METABOLIC PANEL
ALT: 15 U/L (ref 0–44)
AST: 19 U/L (ref 15–41)
Albumin: 3.4 g/dL — ABNORMAL LOW (ref 3.5–5.0)
Alkaline Phosphatase: 100 U/L (ref 38–126)
Anion gap: 11 (ref 5–15)
BUN: 23 mg/dL (ref 8–23)
CO2: 22 mmol/L (ref 22–32)
Calcium: 8.4 mg/dL — ABNORMAL LOW (ref 8.9–10.3)
Chloride: 107 mmol/L (ref 98–111)
Creatinine, Ser: 1.13 mg/dL — ABNORMAL HIGH (ref 0.44–1.00)
GFR calc Af Amer: 54 mL/min — ABNORMAL LOW (ref >60)
GFR calc non Af Amer: 46 mL/min — ABNORMAL LOW (ref >60)
Glucose, Bld: 212 mg/dL — ABNORMAL HIGH (ref 70–99)
Potassium: 4.3 mmol/L (ref 3.5–5.1)
Sodium: 140 mmol/L (ref 135–145)
Total Bilirubin: 0.6 mg/dL (ref 0.3–1.2)
Total Protein: 6.3 g/dL — ABNORMAL LOW (ref 6.5–8.1)

## 2018-12-16 NOTE — Progress Notes (Signed)
Patient here for follow up. No concerns voiced.  °

## 2018-12-16 NOTE — Progress Notes (Signed)
Hematology/Oncology Follow up note [facility] Telephone:([phone] Fax:(336) [phone]   Patient Care Team: Glean Hess, MD as PCP - General (Internal Medicine) Earlie Server, MD as Consulting Physician (Oncology)  REFERRING PROVIDER: Glean Hess, MD  Previous Oncologist Dr.Srujitha Murkutla REASON FOR VISIT Follow up for treatment of MDS  HISTORY OF PRESENTING ILLNESS:  Yesenia Hart is a  78 y.o.  female who recently moved from [place] to [place].  She used to live at [place] with husband.  Accompanied by daughter, Yesenia Hart who is a respiratory therapist at [employer] [facility] [facility] [employer] [facility].  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  Patient presented to emergency room in January 2019 with complaints of intermittent rectal bleeding and increased to have a hemoglobin of 3 and platelet counts was 59,000.  Received 5 units of PRBC and was discharged home with hemoglobin of 10.  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  Cauterization was deferred due to thrombocytopenia. Aspirin was discontinued due to GI bleeding. Peripheral blood flow, SPEP negative, no obvious signs of B12 deficiency.  Ultrasound of the abdomen showed mild fatty liver.  On October 04, 2017, patient underwent bone marrow biopsy. Bone marrow core biopsy, bone marrow aspirate clot, bone marrow aspirate smears showed  single linage dysplasia.  Less than 5% bone marrow blasts and no circulating blast. Iron storage is nearly absent Peripheral blood smear showed moderate and anisopoikilocytosis Marrow aspirate normal in  adenoid maturation Cytogenetics performed at the [facility] in [place] showed 46,xx, t(3;17;16)(p21;q21;q24), add (5) (q11,2), idic(22)(p11,2)[18]/46,xx[2].  20 metaphases, lites were normal and 18 metaphases 5 q.  deletion, it has translocation involving chromosome of 3, 17, 16, and iso-di centric 22.  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  Patient received IV iron infusion on moving to [place].  She has not had any treatment for MDS done due to the move.  Today patient is accompanied by her daughter to the clinic.  She reports feeling tired, no weight loss.  She reports easy bruising.  Intermittent epistaxis which spontaneously resolved after applying pressure denies any hematochezia, hematemesis, hemoptysis.  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 Although patient does have 5q deletion which if isolated, is a good cytogentic group, she carried other cytogentic abnormalities which increases her IPSS-R score.   INTERVAL HISTORY Yesenia Hart is a 78 y.o. female who has above history reviewed by me today presents for assessment prior to MDS treatment with azacitidine.  Patient reports feeling well.  Denies any fever, chills, sore throat, active bleeding events, cough, chest pain, abdominal pain or leg swelling. Chronic fatigue is at baseline.   Thrombocytopenia, counts has been stable.  Denies any easy bruising or bleeding events. She developed diarrhea this past weekend, 2-3 episodes.  No exacerbating factors.  She used Imodium and symptoms resolved.  Review of Systems  Constitutional: Positive for fatigue. Negative for appetite change, chills and fever.  HENT:   Negative for hearing loss and voice change.   Eyes: Negative for eye problems.  Respiratory: Negative for chest tightness and cough.   Cardiovascular: Negative for chest pain.  Gastrointestinal: Negative for abdominal distention, abdominal pain and blood in stool.  Endocrine: Negative for hot flashes.  Genitourinary: Negative for difficulty urinating and frequency.  Musculoskeletal: Negative for arthralgias.  Skin: Negative for  itching and rash.  Neurological: Negative for extremity weakness.  Hematological: Negative for adenopathy.  Psychiatric/Behavioral: Negative for confusion.    MEDICAL HISTORY:  Past Medical History:  Diagnosis Date  . Anemia   . B12 deficiency 06/10/2018  . Clotting disorder ([place])   . Diabetes mellitus without complication ([place])   . IBS (irritable bowel syndrome)   . Iron deficiency anemia due to chronic blood loss 12/12/2017  . MDS (myelodysplastic syndrome) ([place])   . MDS (myelodysplastic syndrome) ([place])   . Thyroid disease     SURGICAL HISTORY: Past Surgical History:  Procedure Laterality Date  . APPENDECTOMY    . breast biopsy 85'    . CHOLECYSTECTOMY    . CORONARY ANGIOPLASTY WITH STENT PLACEMENT  2016   in [place]: Social History   Socioeconomic History  . Marital status: Married    Spouse name: Not on file  . Number of children: Not on file  . Years of education: Not on file  . Highest education level: Not on file  Occupational History  . Occupation: retired  Scientific laboratory technician  . Financial resource strain: Not on file  . Food insecurity:    Worry: Not on file    Inability: Not on file  . Transportation needs:    Medical: Not on file    Non-medical: Not on file  Tobacco Use  . Smoking status: Former Smoker    Packs/day: 1.00    Types: Cigarettes    Last attempt to quit: 1999    Years since quitting: 21.4  . Smokeless tobacco: Never Used  Substance and Sexual Activity  . Alcohol use: Not Currently  . Drug use: Never  . Sexual activity: Yes  Lifestyle  . Physical activity:    Days per week: Not on file    Minutes per session: Not on file  . Stress: Not on file  Relationships  . Social connections:    Talks on phone: Not on file    Gets together: Not on file    Attends religious service: Not on file    Active member of club or organization: Not on file    Attends meetings of clubs or organizations: Not on file    Relationship  status: Not on file  . Intimate partner violence:    Fear of current or ex partner: Not on file    Emotionally abused: Not on file    Physically abused: Not on file    Forced sexual activity: Not on file  Other Topics Concern  . Not on file  Social History Narrative  . Not on file    FAMILY HISTORY: Family History  Adopted: Yes  Family history unknown: Yes    ALLERGIES:  is allergic to acarbose; demerol [meperidine hcl]; meperidine; and pioglitazone.  MEDICATIONS:  Current Outpatient Medications  Medication Sig Dispense Refill  . atorvastatin (LIPITOR) 20 MG tablet Take 1 tablet (20 mg total) by mouth daily. 90 tablet 1  . azaCITIDine in lactated ringers infusion Inject 100 mg/m2 into the vein daily.    . furosemide (LASIX) 20 MG tablet TAKE 1 TABLET BY MOUTH ONCE DAILY 30 tablet 3  . glimepiride (AMARYL) 4 MG tablet TAKE 1 TABLET BY MOUTH ONCE DAILY 90 tablet 1  . Lancets (ONETOUCH ULTRASOFT) lancets Use as instructed 100 each 12  . levothyroxine (SYNTHROID, LEVOTHROID) 50 MCG tablet TAKE 1 TABLET BY MOUTH ONCE DAILY ON AN EMPTY  STOMACH. WAIT 30 MINUTES BEFORE TAKING OTHER MEDS. 90 tablet 1  . loperamide (IMODIUM) 2 MG capsule Take 2 mg by mouth as needed for diarrhea or loose stools.    Marland Kitchen NOVOLOG MIX 70/30 FLEXPEN (70-30) 100 UNIT/ML FlexPen Inject 0.1-0.15 mLs (10-15 Units total) into the skin 2 (two) times daily with a meal. (Patient taking differently: Inject 8 Units into the skin 2 (two) times daily with a meal. ) 21 mL 1  . nystatin (MYCOSTATIN/NYSTOP) powder nystatin 100,000 unit/gram topical powder   1 app by topical route.    . ondansetron (ZOFRAN) 8 MG tablet TAKE 1 TABLET BY MOUTH TWICE DAILY AS NEEDED NAUSEA AND VOMITING 30 tablet 1  . ONE TOUCH ULTRA TEST test strip TEST TWICE DAILY 200 each 1  . pantoprazole (PROTONIX) 40 MG tablet Take 1 tablet (40 mg total) by mouth daily. 90 tablet 1  . quinapril (ACCUPRIL) 20 MG tablet TAKE 1 TABLET BY MOUTH ONCE DAILY 90 tablet  1  . sulfaSALAzine (AZULFIDINE) 500 MG EC tablet TAKE 1 TABLET BY MOUTH AT BEDTIME 90 tablet 1  . ULTICARE MICRO PEN NEEDLES 32G X 4 MM MISC Inject 1 each into the skin 2 (two) times daily. 100 each 12   No current facility-administered medications for this visit.      PHYSICAL EXAMINATION: ECOG PERFORMANCE STATUS: 1 - Symptomatic but completely ambulatory Vitals:   12/16/18 0856  BP: 118/68  Pulse: 70  Resp: 18  Temp: (!) 96.8 F (36 C)   Filed Weights   12/16/18 0856  Weight: 162 lb 3.2 oz (73.6 kg)    Physical Exam Constitutional:      General: She is not in acute distress. HENT:     Head: Normocephalic and atraumatic.  Eyes:     General: No scleral icterus.    Conjunctiva/sclera: Conjunctivae normal.     Pupils: Pupils are equal, round, and reactive to light.  Neck:     Musculoskeletal: Normal range of motion and neck supple.  Cardiovascular:     Rate and Rhythm: Normal rate and regular rhythm.     Heart sounds: Normal heart sounds.  Pulmonary:     Effort: Pulmonary effort is normal. No respiratory distress.     Breath sounds: Normal breath sounds. No wheezing or rales.  Chest:     Chest wall: No tenderness.  Abdominal:     General: Bowel sounds are normal. There is no distension.     Palpations: Abdomen is soft. There is no mass.     Tenderness: There is no abdominal tenderness.  Musculoskeletal: Normal range of motion.        General: No deformity.  Lymphadenopathy:     Cervical: No cervical adenopathy.  Skin:    General: Skin is warm and dry.     Findings: No erythema or rash.  Neurological:     Mental Status: She is alert and oriented to person, place, and time.     Cranial Nerves: No cranial nerve deficit.     Coordination: Coordination normal.  Psychiatric:        Behavior: Behavior normal.        Thought Content: Thought content normal.      LABORATORY DATA:  I have reviewed the data as listed Lab Results  Component Value Date   WBC 2.4  (L) 12/09/2018   HGB 9.6 (L) 12/09/2018   HCT 30.6 (L) 12/09/2018   MCV 99.0 12/09/2018   PLT 82 (L) 12/09/2018   Recent  Labs    11/25/18 1249 12/03/18 1409 12/09/18 1253  NA 137 140 139  K 3.9 4.2 4.5  CL 103 108 107  CO2 _0 GLUCOSE 169* 119* 141*  BUN 18 25* 20  CREATININE 1.13* 1.12* 1.29*  CALCIUM 8.7* 8.6* 8.7*  GFRNONAA 46* 47* 39*  GFRAA 54* 54* 46*  PROT 6.8 6.4* 6.7  ALBUMIN 3.6 3.6 3.7  AST _1 ALT _2 ALKPHOS 107 103 111  BILITOT 0.6 0.7 0.5       ASSESSMENT & PLAN:  1. 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 Neutropenia associated with infection ([place])   3. B12 deficiency   4. 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 CKD (chronic kidney disease) stage 3, GFR 30-59 ml/min (HCC)     # MDS:  Patient has had good hematological response with improvement of platelet counts. Tolerating azacitidine. Labs are reviewed and discussed with patient. Hold chemotherapy due to thrombocytopenia.  See below.  #Neutropenia secondary to chemotherapy, A febrile. Postpone chemotherapy for a week. Lab next week and reevaluate for azacitidine treatment. Continue using prophylactic Peridex oral rinse   I will obtain a repeat bone marrow biopsy to evaluate treatment response.  #History of vitamin B12 deficiency continue oral vitamin B12 supplementation.  #Normocytic anemia, stable hemoglobin.  Stable continue to monitor. # CKD, creatinine is stable.  RTC  1 week evaluation for next cycles of azacitidine. Orders Placed This Encounter  Procedures  . CT BONE MARROW BIOPSY & ASPIRATION    Standing Status:   Future    Standing Expiration Date:   03/17/2020    Order Specific Question:   Reason for Exam (SYMPTOM  OR DIAGNOSIS REQUIRED)    Answer:   MDS    Order Specific Question:   Preferred location?    Answer:   [facility]    Order Specific Question:   Radiology Contrast Protocol - do NOT remove file path     Answer:   _3 [email or web address]    Earlie Server, MD, PhD 12/16/2018

## 2018-12-17 ENCOUNTER — Inpatient Hospital Stay: Payer: Medicare Other

## 2018-12-18 ENCOUNTER — Ambulatory Visit: Payer: Medicare Other

## 2018-12-19 ENCOUNTER — Ambulatory Visit: Payer: Medicare Other

## 2018-12-20 ENCOUNTER — Ambulatory Visit: Payer: Medicare Other

## 2018-12-23 ENCOUNTER — Other Ambulatory Visit: Payer: Self-pay

## 2018-12-23 ENCOUNTER — Inpatient Hospital Stay: Payer: Medicare Other

## 2018-12-23 ENCOUNTER — Inpatient Hospital Stay (HOSPITAL_BASED_OUTPATIENT_CLINIC_OR_DEPARTMENT_OTHER): Payer: Medicare Other | Admitting: Oncology

## 2018-12-23 ENCOUNTER — Encounter: Payer: Self-pay | Admitting: Oncology

## 2018-12-23 VITALS — BP 132/64 | HR 73 | Temp 97.8°F | Wt 162.1 lb

## 2018-12-23 DIAGNOSIS — E079 Disorder of thyroid, unspecified: Secondary | ICD-10-CM | POA: Diagnosis not present

## 2018-12-23 DIAGNOSIS — Z7984 Long term (current) use of oral hypoglycemic drugs: Secondary | ICD-10-CM | POA: Diagnosis not present

## 2018-12-23 DIAGNOSIS — Z87891 Personal history of nicotine dependence: Secondary | ICD-10-CM

## 2018-12-23 DIAGNOSIS — D696 Thrombocytopenia, unspecified: Secondary | ICD-10-CM

## 2018-12-23 DIAGNOSIS — D649 Anemia, unspecified: Secondary | ICD-10-CM | POA: Diagnosis not present

## 2018-12-23 DIAGNOSIS — D701 Agranulocytosis secondary to cancer chemotherapy: Secondary | ICD-10-CM

## 2018-12-23 DIAGNOSIS — E119 Type 2 diabetes mellitus without complications: Secondary | ICD-10-CM

## 2018-12-23 DIAGNOSIS — K580 Irritable bowel syndrome with diarrhea: Secondary | ICD-10-CM

## 2018-12-23 DIAGNOSIS — E538 Deficiency of other specified B group vitamins: Secondary | ICD-10-CM

## 2018-12-23 DIAGNOSIS — N183 Chronic kidney disease, stage 3 (moderate): Secondary | ICD-10-CM

## 2018-12-23 DIAGNOSIS — D46C Myelodysplastic syndrome with isolated del(5q) chromosomal abnormality: Secondary | ICD-10-CM

## 2018-12-23 DIAGNOSIS — Z79899 Other long term (current) drug therapy: Secondary | ICD-10-CM

## 2018-12-23 DIAGNOSIS — T451X5A Adverse effect of antineoplastic and immunosuppressive drugs, initial encounter: Secondary | ICD-10-CM

## 2018-12-23 LAB — CBC WITH DIFFERENTIAL/PLATELET
Abs Immature Granulocytes: 0.01 10*3/uL (ref 0.00–0.07)
Basophils Absolute: 0 10*3/uL (ref 0.0–0.1)
Basophils Relative: 0 %
Eosinophils Absolute: 0.1 10*3/uL (ref 0.0–0.5)
Eosinophils Relative: 3 %
HCT: 30.4 % — ABNORMAL LOW (ref 36.0–46.0)
Hemoglobin: 9.3 g/dL — ABNORMAL LOW (ref 12.0–15.0)
Immature Granulocytes: 0 %
Lymphocytes Relative: 46 %
Lymphs Abs: 1.3 10*3/uL (ref 0.7–4.0)
MCH: 30.4 pg (ref 26.0–34.0)
MCHC: 30.6 g/dL (ref 30.0–36.0)
MCV: 99.3 fL (ref 80.0–100.0)
Monocytes Absolute: 0.5 10*3/uL (ref 0.1–1.0)
Monocytes Relative: 18 %
Neutro Abs: 1 10*3/uL — ABNORMAL LOW (ref 1.7–7.7)
Neutrophils Relative %: 33 %
Platelets: 95 10*3/uL — ABNORMAL LOW (ref 150–400)
RBC: 3.06 MIL/uL — ABNORMAL LOW (ref 3.87–5.11)
RDW: 19.5 % — ABNORMAL HIGH (ref 11.5–15.5)
WBC: 2.8 10*3/uL — ABNORMAL LOW (ref 4.0–10.5)
nRBC: 0 % (ref 0.0–0.2)

## 2018-12-23 LAB — COMPREHENSIVE METABOLIC PANEL
ALT: 15 U/L (ref 0–44)
AST: 17 U/L (ref 15–41)
Albumin: 3.7 g/dL (ref 3.5–5.0)
Alkaline Phosphatase: 110 U/L (ref 38–126)
Anion gap: 8 (ref 5–15)
BUN: 19 mg/dL (ref 8–23)
CO2: 23 mmol/L (ref 22–32)
Calcium: 8.7 mg/dL — ABNORMAL LOW (ref 8.9–10.3)
Chloride: 105 mmol/L (ref 98–111)
Creatinine, Ser: 1.15 mg/dL — ABNORMAL HIGH (ref 0.44–1.00)
GFR calc Af Amer: 52 mL/min — ABNORMAL LOW (ref >60)
GFR calc non Af Amer: 45 mL/min — ABNORMAL LOW (ref >60)
Glucose, Bld: 122 mg/dL — ABNORMAL HIGH (ref 70–99)
Potassium: 4.6 mmol/L (ref 3.5–5.1)
Sodium: 136 mmol/L (ref 135–145)
Total Bilirubin: 0.4 mg/dL (ref 0.3–1.2)
Total Protein: 6.7 g/dL (ref 6.5–8.1)

## 2018-12-24 ENCOUNTER — Other Ambulatory Visit: Payer: Self-pay | Admitting: Radiology

## 2018-12-24 ENCOUNTER — Other Ambulatory Visit: Payer: Medicare Other

## 2018-12-24 ENCOUNTER — Inpatient Hospital Stay: Payer: Medicare Other

## 2018-12-24 DIAGNOSIS — D709 Neutropenia, unspecified: Secondary | ICD-10-CM | POA: Insufficient documentation

## 2018-12-24 DIAGNOSIS — D649 Anemia, unspecified: Secondary | ICD-10-CM | POA: Insufficient documentation

## 2018-12-24 DIAGNOSIS — D61818 Other pancytopenia: Secondary | ICD-10-CM | POA: Insufficient documentation

## 2018-12-24 NOTE — Progress Notes (Signed)
Hematology/Oncology Follow up note [facility] Telephone:(336) [phone] Fax:(336) [phone]   Patient Care Team: Glean Hess, MD as PCP - General (Internal Medicine) Earlie Server, MD as Consulting Physician (Oncology)  REFERRING PROVIDER: Glean Hess, MD  Previous Oncologist Dr.Srujitha Murkutla REASON FOR VISIT Follow up for treatment of MDS  HISTORY OF PRESENTING ILLNESS:  Yesenia Hart is a  78 y.o.  female who recently moved from [place] to [place].  She used to live at [place] with husband.  Accompanied by daughter, [name] who is a respiratory therapist at [employer].  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  Patient presented to emergency room in January 2019 with complaints of intermittent rectal bleeding and increased to have a hemoglobin of 3 and platelet counts was 59,000.  Received 5 units of PRBC and was discharged home with hemoglobin of 10.  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  Cauterization was deferred due to thrombocytopenia. Aspirin was discontinued due to GI bleeding. Peripheral blood flow, SPEP negative, no obvious signs of B12 deficiency.  Ultrasound of the abdomen showed mild fatty liver.  On October 04, 2017, patient underwent bone marrow biopsy. Bone marrow core biopsy, bone marrow aspirate clot, bone marrow aspirate smears showed  single linage dysplasia.  Less than 5% bone marrow blasts and no circulating blast. Iron storage is nearly absent Peripheral blood smear showed moderate and anisopoikilocytosis Marrow aspirate normal in  adenoid maturation Cytogenetics performed at the [facility] in [place] showed 46,xx, t(3;17;16)(p21;q21;q24), add (5) (q11,2), idic(22)(p11,2)[18]/46,xx[2].  20 metaphases, lites were normal and 18 metaphases 5 q.  deletion, it has translocation involving chromosome of 3, 17, 16, and iso-di centric 22.  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  Patient received IV iron infusion on moving to [place].  She has not had any treatment for MDS done due to the move.  Today patient is accompanied by her daughter to the clinic.  She reports feeling tired, no weight loss.  She reports easy bruising.  Intermittent epistaxis which spontaneously resolved after applying pressure denies any hematochezia, hematemesis, hemoptysis.  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 Although patient does have 5q deletion which if isolated, is a good cytogentic group, she carried other cytogentic abnormalities which increases her IPSS-R score.   INTERVAL HISTORY Yesenia Hart is a 78 y.o. female who has above history reviewed by me today presents for assessment prior to MDS treatment with azacitidine.  Patient reports that she feels well today.  Chronic fatigue at baseline.  No bleeding events.   Diarrhea has improved.    Review of Systems  Constitutional: Positive for fatigue. Negative for appetite change, chills and fever.  HENT:   Negative for hearing loss and voice change.   Eyes: Negative for eye problems.  Respiratory: Negative for chest tightness and cough.   Cardiovascular: Negative for chest pain.  Gastrointestinal: Negative for abdominal distention, abdominal pain and blood in stool.  Endocrine: Negative for hot flashes.  Genitourinary: Negative for difficulty urinating and frequency.   Musculoskeletal: Negative for arthralgias.  Skin: Negative for itching and rash.  Neurological: Negative for extremity weakness.  Hematological: Negative for adenopathy.  Psychiatric/Behavioral: Negative for confusion.    MEDICAL HISTORY:  Past Medical History:  Diagnosis  Date  . Anemia   . B12 deficiency 06/10/2018  .  Clotting disorder ([place])   . Diabetes mellitus without complication ([place])   . IBS (irritable bowel syndrome)   . Iron deficiency anemia due to chronic blood loss 12/12/2017  . MDS (myelodysplastic syndrome) ([place])   . MDS (myelodysplastic syndrome) ([place])   . Thyroid disease     SURGICAL HISTORY: Past Surgical History:  Procedure Laterality Date  . APPENDECTOMY    . breast biopsy 20'    . CHOLECYSTECTOMY    . CORONARY ANGIOPLASTY WITH STENT PLACEMENT  2016   in [place]: Social History   Socioeconomic History  . Marital status: Married    Spouse name: Not on file  . Number of children: Not on file  . Years of education: Not on file  . Highest education level: Not on file  Occupational History  . Occupation: retired  Scientific laboratory technician  . Financial resource strain: Not on file  . Food insecurity    Worry: Not on file    Inability: Not on file  . Transportation needs    Medical: Not on file    Non-medical: Not on file  Tobacco Use  . Smoking status: Former Smoker    Packs/day: 1.00    Types: Cigarettes    Quit date: 1999    Years since quitting: 21.4  . Smokeless tobacco: Never Used  Substance and Sexual Activity  . Alcohol use: Not Currently  . Drug use: Never  . Sexual activity: Yes  Lifestyle  . Physical activity    Days per week: Not on file    Minutes per session: Not on file  . Stress: Not on file  Relationships  . Social Herbalist on phone: Not on file    Gets together: Not on file    Attends religious service: Not on file    Active member of club or organization: Not on file    Attends meetings of clubs or organizations: Not on file    Relationship status: Not on file  . Intimate partner violence    Fear of current or ex partner: Not on file    Emotionally abused: Not on file    Physically abused: Not on file    Forced sexual activity: Not on file  Other Topics Concern  . Not on file  Social History Narrative  . Not on file     FAMILY HISTORY: Family History  Adopted: Yes  Family history unknown: Yes    ALLERGIES:  is allergic to acarbose; demerol [meperidine hcl]; meperidine; and pioglitazone.  MEDICATIONS:  Current Outpatient Medications  Medication Sig Dispense Refill  . atorvastatin (LIPITOR) 20 MG tablet Take 1 tablet (20 mg total) by mouth daily. 90 tablet 1  . azaCITIDine in lactated ringers infusion Inject 100 mg/m2 into the vein daily.    . furosemide (LASIX) 20 MG tablet TAKE 1 TABLET BY MOUTH ONCE DAILY 30 tablet 3  . glimepiride (AMARYL) 4 MG tablet TAKE 1 TABLET BY MOUTH ONCE DAILY 90 tablet 1  . Lancets (ONETOUCH ULTRASOFT) lancets Use as instructed 100 each 12  . levothyroxine (SYNTHROID, LEVOTHROID) 50 MCG tablet TAKE 1 TABLET BY MOUTH ONCE DAILY ON AN EMPTY STOMACH. WAIT 30 MINUTES BEFORE TAKING OTHER MEDS. 90 tablet 1  . loperamide (IMODIUM) 2 MG capsule Take 2 mg by mouth as needed for diarrhea or loose stools.    Marland Kitchen NOVOLOG MIX 70/30 FLEXPEN (70-30) 100  UNIT/ML FlexPen Inject 0.1-0.15 mLs (10-15 Units total) into the skin 2 (two) times daily with a meal. (Patient taking differently: Inject 8 Units into the skin 2 (two) times daily with a meal. ) 21 mL 1  . nystatin (MYCOSTATIN/NYSTOP) powder nystatin 100,000 unit/gram topical powder   1 app by topical route.    . ondansetron (ZOFRAN) 8 MG tablet TAKE 1 TABLET BY MOUTH TWICE DAILY AS NEEDED NAUSEA AND VOMITING 30 tablet 1  . pantoprazole (PROTONIX) 40 MG tablet Take 1 tablet (40 mg total) by mouth daily. 90 tablet 1  . quinapril (ACCUPRIL) 20 MG tablet TAKE 1 TABLET BY MOUTH ONCE DAILY 90 tablet 1  . sulfaSALAzine (AZULFIDINE) 500 MG EC tablet TAKE 1 TABLET BY MOUTH AT BEDTIME 90 tablet 1  . ONE TOUCH ULTRA TEST test strip TEST TWICE DAILY 200 each 1  . ULTICARE MICRO PEN NEEDLES 32G X 4 MM MISC Inject 1 each into the skin 2 (two) times daily. 100 each 12   No current facility-administered medications for this visit.      PHYSICAL  EXAMINATION: ECOG PERFORMANCE STATUS: 1 - Symptomatic but completely ambulatory Vitals:   12/23/18 1358  BP: 132/64  Pulse: 73  Temp: 97.8 F (36.6 C)   Filed Weights   12/23/18 1358  Weight: 162 lb 1.6 oz (73.5 kg)    Physical Exam Constitutional:      General: She is not in acute distress. HENT:     Head: Normocephalic and atraumatic.  Eyes:     General: No scleral icterus.    Conjunctiva/sclera: Conjunctivae normal.     Pupils: Pupils are equal, round, and reactive to light.  Neck:     Musculoskeletal: Normal range of motion and neck supple.  Cardiovascular:     Rate and Rhythm: Normal rate and regular rhythm.     Heart sounds: Normal heart sounds.  Pulmonary:     Effort: Pulmonary effort is normal. No respiratory distress.     Breath sounds: Normal breath sounds. No wheezing or rales.  Chest:     Chest wall: No tenderness.  Abdominal:     General: Bowel sounds are normal. There is no distension.     Palpations: Abdomen is soft. There is no mass.     Tenderness: There is no abdominal tenderness.  Musculoskeletal: Normal range of motion.        General: No deformity.  Lymphadenopathy:     Cervical: No cervical adenopathy.  Skin:    General: Skin is warm and dry.     Findings: No erythema or rash.  Neurological:     Mental Status: She is alert and oriented to person, place, and time.     Cranial Nerves: No cranial nerve deficit.     Coordination: Coordination normal.  Psychiatric:        Behavior: Behavior normal.        Thought Content: Thought content normal.      LABORATORY DATA:  I have reviewed the data as listed Lab Results  Component Value Date   WBC 2.8 (L) 12/23/2018   HGB 9.3 (L) 12/23/2018   HCT 30.4 (L) 12/23/2018   MCV 99.3 12/23/2018   PLT 95 (L) 12/23/2018   Recent Labs    12/09/18 1253 12/16/18 0839 12/23/18 1336  NA 139 140 136  K 4.5 4.3 4.6  CL 107 107 105  CO2 24 22 23   GLUCOSE 141* 212* 122*  BUN 20 23 19   CREATININE  1.29* 1.13*  1.15*  CALCIUM 8.7* 8.4* 8.7*  GFRNONAA 39* 46* 45*  GFRAA 46* 54* 52*  PROT 6.7 6.3* 6.7  ALBUMIN 3.7 3.4* 3.7  AST 18 19 17   ALT 12 15 15   ALKPHOS 111 100 110  BILITOT 0.5 0.6 0.4       ASSESSMENT & PLAN:  1. 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 Thrombocytopenia ([place])   3. Chemotherapy-induced neutropenia (HCC)   4. Anemia, unspecified type     # MDS: Labs are reviewed and discussed with patient.Hold chemotherapy today.see below.  Anemia, hemoglobin is stable.  Chemotherapy induced neutropenia has improve. Thrombocytopenia, decreased since last week.  Recommend to proceed with bone marrow biopsy for further evaluation.   RTC  1 week evaluation after bone marrow We spent sufficient time to discuss many aspect of care, questions were answered to patient's satisfaction. Total face to face encounter time for this patient visit was 15 min. >50% of the time was  spent in counseling and coordination of care.   Earlie Server, MD, PhD 12/24/2018

## 2018-12-25 ENCOUNTER — Other Ambulatory Visit: Payer: Medicare Other

## 2018-12-25 ENCOUNTER — Inpatient Hospital Stay: Payer: Medicare Other

## 2018-12-26 ENCOUNTER — Other Ambulatory Visit: Payer: Medicare Other

## 2018-12-26 ENCOUNTER — Other Ambulatory Visit: Payer: Self-pay

## 2018-12-26 ENCOUNTER — Encounter: Payer: Self-pay | Admitting: Oncology

## 2018-12-26 ENCOUNTER — Ambulatory Visit: Payer: Medicare Other

## 2018-12-26 ENCOUNTER — Ambulatory Visit
Admission: RE | Admit: 2018-12-26 | Discharge: 2018-12-26 | Disposition: A | Discharge status: Home / Self Care | Payer: Medicare Other | Source: Ambulatory Visit | Attending: Oncology | Admitting: Oncology

## 2018-12-26 ENCOUNTER — Other Ambulatory Visit: Payer: Self-pay | Admitting: Internal Medicine

## 2018-12-26 ENCOUNTER — Other Ambulatory Visit (HOSPITAL_COMMUNITY)
Admission: RE | Admit: 2018-12-26 | Disposition: A | Discharge status: Home / Self Care | Payer: Medicare Other | Source: Ambulatory Visit | Attending: Oncology | Admitting: Oncology

## 2018-12-26 DIAGNOSIS — D46C Myelodysplastic syndrome with isolated del(5q) chromosomal abnormality: Secondary | ICD-10-CM | POA: Insufficient documentation

## 2018-12-26 DIAGNOSIS — D696 Thrombocytopenia, unspecified: Secondary | ICD-10-CM | POA: Diagnosis not present

## 2018-12-26 DIAGNOSIS — E119 Type 2 diabetes mellitus without complications: Secondary | ICD-10-CM | POA: Insufficient documentation

## 2018-12-26 DIAGNOSIS — D61818 Other pancytopenia: Secondary | ICD-10-CM | POA: Diagnosis not present

## 2018-12-26 DIAGNOSIS — D7589 Other specified diseases of blood and blood-forming organs: Secondary | ICD-10-CM | POA: Diagnosis not present

## 2018-12-26 DIAGNOSIS — D472 Monoclonal gammopathy: Secondary | ICD-10-CM | POA: Diagnosis not present

## 2018-12-26 LAB — CBC WITH DIFFERENTIAL/PLATELET
Abs Immature Granulocytes: 0.02 10*3/uL (ref 0.00–0.07)
Basophils Absolute: 0 10*3/uL (ref 0.0–0.1)
Basophils Relative: 0 %
Eosinophils Absolute: 0.1 10*3/uL (ref 0.0–0.5)
Eosinophils Relative: 1 %
HCT: 33 % — ABNORMAL LOW (ref 36.0–46.0)
Hemoglobin: 10.3 g/dL — ABNORMAL LOW (ref 12.0–15.0)
Immature Granulocytes: 1 %
Lymphocytes Relative: 33 %
Lymphs Abs: 1.2 10*3/uL (ref 0.7–4.0)
MCH: 30.8 pg (ref 26.0–34.0)
MCHC: 31.2 g/dL (ref 30.0–36.0)
MCV: 98.8 fL (ref 80.0–100.0)
Monocytes Absolute: 0.5 10*3/uL (ref 0.1–1.0)
Monocytes Relative: 13 %
Neutro Abs: 1.8 10*3/uL (ref 1.7–7.7)
Neutrophils Relative %: 52 %
Platelets: 83 10*3/uL — ABNORMAL LOW (ref 150–400)
RBC: 3.34 MIL/uL — ABNORMAL LOW (ref 3.87–5.11)
RDW: 19.2 % — ABNORMAL HIGH (ref 11.5–15.5)
WBC: 3.6 10*3/uL — ABNORMAL LOW (ref 4.0–10.5)
nRBC: 0 % (ref 0.0–0.2)

## 2018-12-26 LAB — PROTIME-INR
INR: 0.9 (ref 0.8–1.2)
Prothrombin Time: 12.4 seconds (ref 11.4–15.2)

## 2018-12-26 LAB — GLUCOSE, CAPILLARY: Glucose-Capillary: 154 mg/dL — ABNORMAL HIGH (ref 70–99)

## 2018-12-26 MED ORDER — HYDROCODONE-ACETAMINOPHEN 5-325 MG PO TABS
1.0000 | ORAL_TABLET | ORAL | Status: DC | PRN
  Filled 2018-12-26: qty 2

## 2018-12-26 MED ORDER — FENTANYL CITRATE (PF) 100 MCG/2ML IJ SOLN
INTRAMUSCULAR | Status: AC | PRN
  Administered 2018-12-26 (×2): 50 ug via INTRAVENOUS

## 2018-12-26 MED ORDER — SODIUM CHLORIDE 0.9 % IV SOLN
INTRAVENOUS | Status: DC
  Administered 2018-12-26: 08:00:00 via INTRAVENOUS

## 2018-12-26 MED ORDER — MIDAZOLAM HCL 2 MG/2ML IJ SOLN
INTRAMUSCULAR | Status: AC | PRN
  Administered 2018-12-26 (×2): 1 mg via INTRAVENOUS

## 2018-12-26 NOTE — Procedures (Signed)
  Procedure: CT bone marrow biopsy R iliac EBL:   minimal Complications:  none immediate  See full dictation in [facility].  Dillard Cannon MD Main # [phone] Pager  [phone]

## 2018-12-26 NOTE — Discharge Instructions (Signed)
Moderate Conscious Sedation, Adult, Care After °These instructions provide you with information about caring for yourself after your procedure. Your health care provider may also give you more specific instructions. Your treatment has been planned according to current medical practices, but problems sometimes occur. Call your health care provider if you have any problems or questions after your procedure. °What can I expect after the procedure? °After your procedure, it is common: °· To feel sleepy for several hours. °· To feel clumsy and have poor balance for several hours. °· To have poor judgment for several hours. °· To vomit if you eat too soon. °Follow these instructions at home: °For at least 24 hours after the procedure: ° °· Do not: °? Participate in activities where you could fall or become injured. °? Drive. °? Use heavy machinery. °? Drink alcohol. °? Take sleeping pills or medicines that cause drowsiness. °? Make important decisions or sign legal documents. °? Take care of children on your own. °· Rest. °Eating and drinking °· Follow the diet recommended by your health care provider. °· If you vomit: °? Drink water, juice, or soup when you can drink without vomiting. °? Make sure you have little or no nausea before eating solid foods. °General instructions °· Have a responsible adult stay with you until you are awake and alert. °· Take over-the-counter and prescription medicines only as told by your health care provider. °· If you smoke, do not smoke without supervision. °· Keep all follow-up visits as told by your health care provider. This is important. °Contact a health care provider if: °· You keep feeling nauseous or you keep vomiting. °· You feel light-headed. °· You develop a rash. °· You have a fever. °Get help right away if: °· You have trouble breathing. °This information is not intended to replace advice given to you by your health care provider. Make sure you discuss any questions you have  with your health care provider. °Document Released: 04/16/2013 Document Revised: 11/29/2015 Document Reviewed: 10/16/2015 °Elsevier Interactive Patient Education © 2019 [employer] °Bone Marrow Aspiration and Bone Marrow Biopsy, Adult, Care After °This sheet gives you information about how to care for yourself after your procedure. Your health care provider may also give you more specific instructions. If you have problems or questions, contact your health care provider. °What can I expect after the procedure? °After the procedure, it is common to have: °· Mild pain and tenderness. °· Swelling. °· Bruising. °Follow these instructions at home: °Puncture site care ° °  ° °· Follow instructions from your health care provider about how to take care of the puncture site. Make sure you: °? Wash your hands with soap and water before you change your bandage (dressing). If soap and water are not available, use hand sanitizer. °? Change your dressing as told by your health care provider. °· Check your puncture site every day for signs of infection. Check for: °? More redness, swelling, or pain. °? More fluid or blood. °? Warmth. °? Pus or a bad smell. °General instructions °· Take over-the-counter and prescription medicines only as told by your health care provider. °· Do not take baths, swim, or use a hot tub until your health care provider approves. Ask if you can take a shower or have a sponge bath. °· Return to your normal activities as told by your health care provider. Ask your health care provider what activities are safe for you. °· Do not drive for 24 hours if you were given   a medicine to help you relax (sedative) during your procedure. °· Keep all follow-up visits as told by your health care provider. This is important. °Contact a health care provider if: °· Your pain is not controlled with medicine. °Get help right away if: °· You have a fever. °· You have more redness, swelling, or pain around the puncture  site. °· You have more fluid or blood coming from the puncture site. °· Your puncture site feels warm to the touch. °· You have pus or a bad smell coming from the puncture site. °These symptoms may represent a serious problem that is an emergency. Do not wait to see if the symptoms will go away. Get medical help right away. Call your local emergency services (911 in the [place] Do not drive yourself to the hospital. °Summary °· After the procedure, it is common to have mild pain, tenderness, swelling, and bruising. °· Follow instructions from your health care provider about how to take care of the puncture site. °· Get help right away if you have any symptoms of infection or if you have more blood or fluid coming from the puncture site. °This information is not intended to replace advice given to you by your health care provider. Make sure you discuss any questions you have with your health care provider. °Document Released: 01/13/2005 Document Revised: 10/09/2017 Document Reviewed: 12/08/2015 °Elsevier Interactive Patient Education © 2019 Elsevier Inc. ° °

## 2018-12-26 NOTE — Progress Notes (Signed)
Patient clinically stable post BMB per Dr Vernard Gambles today, tolerated well. Vitals stable at this time, taking po's without difficulty. Sinus rhythm per monitor. Received Fentanyl 151mg iv as well as Versed 260miv for procedure. Called Jane/daughter and gave update over phone , with discharge instructions given to JaOpal Sidlesnd patient with questions answered. Denies complaints at this time.

## 2018-12-27 ENCOUNTER — Other Ambulatory Visit: Payer: Medicare Other

## 2018-12-27 ENCOUNTER — Ambulatory Visit: Payer: Medicare Other

## 2019-01-02 ENCOUNTER — Encounter: Payer: Self-pay | Admitting: Oncology

## 2019-01-02 ENCOUNTER — Other Ambulatory Visit: Payer: Self-pay

## 2019-01-02 ENCOUNTER — Inpatient Hospital Stay (HOSPITAL_BASED_OUTPATIENT_CLINIC_OR_DEPARTMENT_OTHER): Payer: Medicare Other | Admitting: Oncology

## 2019-01-02 VITALS — BP 135/60 | HR 87 | Temp 97.3°F | Resp 18 | Wt 163.1 lb

## 2019-01-02 DIAGNOSIS — Z79899 Other long term (current) drug therapy: Secondary | ICD-10-CM | POA: Diagnosis not present

## 2019-01-02 DIAGNOSIS — E079 Disorder of thyroid, unspecified: Secondary | ICD-10-CM

## 2019-01-02 DIAGNOSIS — N183 Chronic kidney disease, stage 3 (moderate): Secondary | ICD-10-CM | POA: Diagnosis not present

## 2019-01-02 DIAGNOSIS — E538 Deficiency of other specified B group vitamins: Secondary | ICD-10-CM

## 2019-01-02 DIAGNOSIS — K580 Irritable bowel syndrome with diarrhea: Secondary | ICD-10-CM

## 2019-01-02 DIAGNOSIS — Z7984 Long term (current) use of oral hypoglycemic drugs: Secondary | ICD-10-CM

## 2019-01-02 DIAGNOSIS — D696 Thrombocytopenia, unspecified: Secondary | ICD-10-CM

## 2019-01-02 DIAGNOSIS — D649 Anemia, unspecified: Secondary | ICD-10-CM | POA: Diagnosis not present

## 2019-01-02 DIAGNOSIS — Z87891 Personal history of nicotine dependence: Secondary | ICD-10-CM

## 2019-01-02 DIAGNOSIS — D46C Myelodysplastic syndrome with isolated del(5q) chromosomal abnormality: Secondary | ICD-10-CM

## 2019-01-02 DIAGNOSIS — E701 Other hyperphenylalaninemias: Secondary | ICD-10-CM

## 2019-01-02 DIAGNOSIS — D6189 Other specified aplastic anemias and other bone marrow failure syndromes: Secondary | ICD-10-CM

## 2019-01-02 DIAGNOSIS — E119 Type 2 diabetes mellitus without complications: Secondary | ICD-10-CM

## 2019-01-02 DIAGNOSIS — Z5111 Encounter for antineoplastic chemotherapy: Secondary | ICD-10-CM

## 2019-01-02 DIAGNOSIS — D701 Agranulocytosis secondary to cancer chemotherapy: Secondary | ICD-10-CM | POA: Diagnosis not present

## 2019-01-02 NOTE — Progress Notes (Signed)
Patient here for follow up. No concerns voiced.  °

## 2019-01-03 ENCOUNTER — Ambulatory Visit: Payer: Medicare Other

## 2019-01-03 ENCOUNTER — Telehealth: Payer: Self-pay | Admitting: *Deleted

## 2019-01-03 NOTE — Telephone Encounter (Signed)
Patient answered No to all pre screening COVID19 questions.

## 2019-01-04 NOTE — Progress Notes (Signed)
Hematology/Oncology Follow up note [facility] [phone] Fax:([phone]   Patient Care Team: Glean Hess, MD as PCP - General (Internal Medicine) Earlie Server, MD as Consulting Physician (Oncology)  REFERRING PROVIDER: Glean Hess, MD  Previous Oncologist Dr.Srujitha Murkutla REASON FOR VISIT Follow up for treatment of MDS  HISTORY OF PRESENTING ILLNESS:  Yesenia Hart is a  78 y.o.  female who recently moved from [place] to [place].  She used to live at [place] with husband.  Accompanied by daughter, [name] who is a respiratory therapist at [employer].  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  Patient presented to emergency room in January 2019 with complaints of intermittent rectal bleeding and increased to have a hemoglobin of 3 and platelet counts was 59,000.  Received 5 units of PRBC and was discharged home with hemoglobin of 10.  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  Cauterization was deferred due to thrombocytopenia. Aspirin was discontinued due to GI bleeding. Peripheral blood flow, SPEP negative, no obvious signs of B12 deficiency.  Ultrasound of the abdomen showed mild fatty liver.  On October 04, 2017, patient underwent bone marrow biopsy. Bone marrow core biopsy, bone marrow aspirate clot, bone marrow aspirate smears showed  single linage dysplasia.  Less than 5% bone marrow blasts and no circulating blast. 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  20 metaphases, lites were normal and 18 metaphases 5 q.  deletion, it has translocation involving chromosome of 3, 17, 16, and iso-di centric 22.  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  Patient received IV iron infusion on moving to [place].  She has not had any treatment for MDS done due to the move.  Today patient is accompanied by her daughter to the clinic.  She reports feeling tired, no weight loss.  She reports easy bruising.  Intermittent epistaxis which spontaneously resolved after applying pressure denies any hematochezia, hematemesis, hemoptysis.  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 Although patient does have 5q deletion which if isolated, is a good cytogentic group, she carried other cytogentic abnormalities which increases her IPSS-R score.   INTERVAL HISTORY Yesenia Hart is a 78 y.o. female who has above history reviewed by me today presents for discussion of bone marrow biopsy results and assessment prior to MDS treatment with azacitidine.  She reports no complaints today. Chronic fatigue at baseline. No bleeding events. Appetite is good. No diarrhea. Review of Systems  Constitutional: Positive for fatigue. Negative for appetite change, chills and fever.  HENT:   Negative for hearing loss and voice change.   Eyes: Negative for eye problems.  Respiratory: Negative for chest tightness and cough.   Cardiovascular: Negative for chest pain.  Gastrointestinal: Negative for abdominal distention, abdominal pain and blood in stool.  Endocrine: Negative for hot flashes.  Genitourinary: Negative for difficulty urinating and frequency.   Musculoskeletal: Negative for arthralgias.  Skin: Negative for itching and rash.  Neurological: Negative for extremity weakness.  Hematological: Negative for adenopathy.  Psychiatric/Behavioral: Negative for confusion.    MEDICAL HISTORY:  Past Medical History:  Diagnosis  Date  . Anemia    . B12 deficiency 06/10/2018  . Clotting disorder ([place])   . Diabetes mellitus without complication ([place])   . IBS (irritable bowel syndrome)   . Iron deficiency anemia due to chronic blood loss 12/12/2017  . MDS (myelodysplastic syndrome) ([place])   . MDS (myelodysplastic syndrome) ([place])   . Thyroid disease     SURGICAL HISTORY: Past Surgical History:  Procedure Laterality Date  . APPENDECTOMY    . breast biopsy 9'    . CHOLECYSTECTOMY    . CORONARY ANGIOPLASTY WITH STENT PLACEMENT  2016   in [place]: Social History   Socioeconomic History  . Marital status: Married    Spouse name: Not on file  . Number of children: Not on file  . Years of education: Not on file  . Highest education level: Not on file  Occupational History  . Occupation: retired  Scientific laboratory technician  . Financial resource strain: Not on file  . Food insecurity    Worry: Not on file    Inability: Not on file  . Transportation needs    Medical: Not on file    Non-medical: Not on file  Tobacco Use  . Smoking status: Former Smoker    Packs/day: 1.00    Types: Cigarettes    Quit date: 1999    Years since quitting: 21.5  . Smokeless tobacco: Never Used  Substance and Sexual Activity  . Alcohol use: Not Currently  . Drug use: Never  . Sexual activity: Yes  Lifestyle  . Physical activity    Days per week: Not on file    Minutes per session: Not on file  . Stress: Not on file  Relationships  . Social Herbalist on phone: Not on file    Gets together: Not on file    Attends religious service: Not on file    Active member of club or organization: Not on file    Attends meetings of clubs or organizations: Not on file    Relationship status: Not on file  . Intimate partner violence    Fear of current or ex partner: Not on file    Emotionally abused: Not on file    Physically abused: Not on file    Forced sexual activity: Not on file  Other Topics Concern  . Not on file  Social  History Narrative  . Not on file    FAMILY HISTORY: Family History  Adopted: Yes  Family history unknown: Yes    ALLERGIES:  is allergic to acarbose; demerol [meperidine hcl]; meperidine; and pioglitazone.  MEDICATIONS:  Current Outpatient Medications  Medication Sig Dispense Refill  . atorvastatin (LIPITOR) 20 MG tablet Take 1 tablet (20 mg total) by mouth daily. 90 tablet 1  . azaCITIDine in lactated ringers infusion Inject 100 mg/m2 into the vein daily.    . furosemide (LASIX) 20 MG tablet TAKE 1 TABLET BY MOUTH ONCE DAILY 30 tablet 3  . glimepiride (AMARYL) 4 MG tablet TAKE 1 TABLET BY MOUTH ONCE DAILY 90 tablet 1  . Lancets (ONETOUCH ULTRASOFT) lancets Use as instructed 100 each 12  . levothyroxine (SYNTHROID, LEVOTHROID) 50 MCG tablet TAKE 1 TABLET BY MOUTH ONCE DAILY ON AN EMPTY STOMACH. WAIT 30 MINUTES BEFORE TAKING OTHER MEDS. 90 tablet 1  . loperamide (IMODIUM) 2 MG capsule Take 2 mg by mouth as needed for diarrhea or loose stools.    Marland Kitchen NOVOLOG MIX 70/30 FLEXPEN (70-30) 100  UNIT/ML FlexPen Inject 0.08 mLs (8 Units total) into the skin 2 (two) times daily with a meal. 21 mL 5  . nystatin (MYCOSTATIN/NYSTOP) powder nystatin 100,000 unit/gram topical powder   1 app by topical route.    . ondansetron (ZOFRAN) 8 MG tablet TAKE 1 TABLET BY MOUTH TWICE DAILY AS NEEDED NAUSEA AND VOMITING 30 tablet 1  . ONE TOUCH ULTRA TEST test strip TEST TWICE DAILY 200 each 1  . pantoprazole (PROTONIX) 40 MG tablet Take 1 tablet (40 mg total) by mouth daily. 90 tablet 1  . quinapril (ACCUPRIL) 20 MG tablet TAKE 1 TABLET BY MOUTH ONCE DAILY 90 tablet 1  . sulfaSALAzine (AZULFIDINE) 500 MG EC tablet TAKE 1 TABLET BY MOUTH AT BEDTIME 90 tablet 1  . ULTICARE MICRO PEN NEEDLES 32G X 4 MM MISC Inject 1 each into the skin 2 (two) times daily. 100 each 12   No current facility-administered medications for this visit.      PHYSICAL EXAMINATION: ECOG PERFORMANCE STATUS: 1 - Symptomatic but completely  ambulatory Vitals:   01/02/19 1015  BP: 135/60  Pulse: 87  Resp: 18  Temp: (!) 97.3 F (36.3 C)   Filed Weights   01/02/19 1015  Weight: 163 lb 1.6 oz (74 kg)    Physical Exam Constitutional:      General: She is not in acute distress. HENT:     Head: Normocephalic and atraumatic.  Eyes:     General: No scleral icterus.    Conjunctiva/sclera: Conjunctivae normal.     Pupils: Pupils are equal, round, and reactive to light.  Neck:     Musculoskeletal: Normal range of motion and neck supple.  Cardiovascular:     Rate and Rhythm: Normal rate and regular rhythm.     Heart sounds: Normal heart sounds.  Pulmonary:     Effort: Pulmonary effort is normal. No respiratory distress.     Breath sounds: Normal breath sounds. No wheezing or rales.  Chest:     Chest wall: No tenderness.  Abdominal:     General: Bowel sounds are normal. There is no distension.     Palpations: Abdomen is soft. There is no mass.     Tenderness: There is no abdominal tenderness.  Musculoskeletal: Normal range of motion.        General: No deformity.  Lymphadenopathy:     Cervical: No cervical adenopathy.  Skin:    General: Skin is warm and dry.     Findings: No erythema or rash.  Neurological:     Mental Status: She is alert and oriented to person, place, and time.     Cranial Nerves: No cranial nerve deficit.     Coordination: Coordination normal.  Psychiatric:        Behavior: Behavior normal.        Thought Content: Thought content normal.      LABORATORY DATA:  I have reviewed the data as listed Lab Results  Component Value Date   WBC 3.6 (L) 12/26/2018   HGB 10.3 (L) 12/26/2018   HCT 33.0 (L) 12/26/2018   MCV 98.8 12/26/2018   PLT 83 (L) 12/26/2018   Recent Labs    12/09/18 1253 12/16/18 0839 12/23/18 1336  NA 139 140 136  K 4.5 4.3 4.6  CL 107 107 105  CO2 _0 GLUCOSE 141* 212* 122*  BUN _1 CREATININE 1.29* 1.13* 1.15*  CALCIUM 8.7* 8.4* 8.7*  GFRNONAA 39*  46* 45*  GFRAA  46* 54* 52*  PROT 6.7 6.3* 6.7  ALBUMIN 3.7 3.4* 3.7  AST _0 ALT _1 ALKPHOS 111 100 110  BILITOT 0.5 0.6 0.4       ASSESSMENT & PLAN:  1. 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 Thrombocytopenia ([place])   3. Encounter for antineoplastic chemotherapy   4. B12 deficiency   5. 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 Bone marrow biopsy showed normocellular, dysplastic erythroid and megakaryocyte lineages.  Note increase of blast.  Consistent with MDS. Discussed with patient and her daughter about continuing same treatment. Patient voices understanding and willing to proceed with additional treatments. Labs reviewed and discussed with patient. She will proceed with labs and azacitidine day 1 to day 5 next week.  #History of vitamin B12 deficiency.  Vitamin B12 level on 08/12/2018 was 790.  Repeat level at next visit.  RTC 4 weeks for assessment prior to next cycle.  We spent sufficient time to discuss many aspect of care, questions were answered to patient's satisfaction. Total face to face encounter time for this patient visit was 25 min. >50% of the time was  spent in counseling and coordination of care.   Earlie Server, MD, PhD 01/04/2019

## 2019-01-06 ENCOUNTER — Encounter (HOSPITAL_COMMUNITY): Payer: Self-pay | Admitting: Oncology

## 2019-01-06 ENCOUNTER — Other Ambulatory Visit: Payer: Self-pay

## 2019-01-06 ENCOUNTER — Inpatient Hospital Stay: Payer: Medicare Other

## 2019-01-06 VITALS — BP 150/68 | HR 73 | Temp 97.0°F | Resp 19 | Wt 165.0 lb

## 2019-01-06 DIAGNOSIS — D696 Thrombocytopenia, unspecified: Secondary | ICD-10-CM

## 2019-01-06 DIAGNOSIS — E538 Deficiency of other specified B group vitamins: Secondary | ICD-10-CM

## 2019-01-06 DIAGNOSIS — Z5111 Encounter for antineoplastic chemotherapy: Secondary | ICD-10-CM

## 2019-01-06 DIAGNOSIS — D649 Anemia, unspecified: Secondary | ICD-10-CM | POA: Diagnosis not present

## 2019-01-06 DIAGNOSIS — D46C Myelodysplastic syndrome with isolated del(5q) chromosomal abnormality: Secondary | ICD-10-CM

## 2019-01-06 DIAGNOSIS — N183 Chronic kidney disease, stage 3 (moderate): Secondary | ICD-10-CM | POA: Diagnosis not present

## 2019-01-06 DIAGNOSIS — E119 Type 2 diabetes mellitus without complications: Secondary | ICD-10-CM | POA: Diagnosis not present

## 2019-01-06 DIAGNOSIS — D701 Agranulocytosis secondary to cancer chemotherapy: Secondary | ICD-10-CM | POA: Diagnosis not present

## 2019-01-06 LAB — CBC WITH DIFFERENTIAL/PLATELET
Abs Immature Granulocytes: 0.03 10*3/uL (ref 0.00–0.07)
Basophils Absolute: 0 10*3/uL (ref 0.0–0.1)
Basophils Relative: 0 %
Eosinophils Absolute: 0.1 10*3/uL (ref 0.0–0.5)
Eosinophils Relative: 2 %
HCT: 28.5 % — ABNORMAL LOW (ref 36.0–46.0)
Hemoglobin: 9 g/dL — ABNORMAL LOW (ref 12.0–15.0)
Immature Granulocytes: 1 %
Lymphocytes Relative: 30 %
Lymphs Abs: 1.4 10*3/uL (ref 0.7–4.0)
MCH: 31.1 pg (ref 26.0–34.0)
MCHC: 31.6 g/dL (ref 30.0–36.0)
MCV: 98.6 fL (ref 80.0–100.0)
Monocytes Absolute: 0.5 10*3/uL (ref 0.1–1.0)
Monocytes Relative: 12 %
Neutro Abs: 2.6 10*3/uL (ref 1.7–7.7)
Neutrophils Relative %: 55 %
Platelets: 72 10*3/uL — ABNORMAL LOW (ref 150–400)
RBC: 2.89 MIL/uL — ABNORMAL LOW (ref 3.87–5.11)
RDW: 18.9 % — ABNORMAL HIGH (ref 11.5–15.5)
WBC: 4.7 10*3/uL (ref 4.0–10.5)
nRBC: 0 % (ref 0.0–0.2)

## 2019-01-06 LAB — RETIC PANEL
Immature Retic Fract: 13.6 % (ref 2.3–15.9)
RBC.: 2.89 MIL/uL — ABNORMAL LOW (ref 3.87–5.11)
Retic Count, Absolute: 59.2 10*3/uL (ref 19.0–186.0)
Retic Ct Pct: 2.1 % (ref 0.4–3.1)
Reticulocyte Hemoglobin: 32.2 pg (ref >27.9)

## 2019-01-06 LAB — COMPREHENSIVE METABOLIC PANEL
ALT: 17 U/L (ref 0–44)
AST: 21 U/L (ref 15–41)
Albumin: 3.5 g/dL (ref 3.5–5.0)
Alkaline Phosphatase: 106 U/L (ref 38–126)
Anion gap: 9 (ref 5–15)
BUN: 16 mg/dL (ref 8–23)
CO2: 26 mmol/L (ref 22–32)
Calcium: 8.5 mg/dL — ABNORMAL LOW (ref 8.9–10.3)
Chloride: 106 mmol/L (ref 98–111)
Creatinine, Ser: 1 mg/dL (ref 0.44–1.00)
GFR calc Af Amer: >60 mL/min (ref >60)
GFR calc non Af Amer: 54 mL/min — ABNORMAL LOW (ref >60)
Glucose, Bld: 101 mg/dL — ABNORMAL HIGH (ref 70–99)
Potassium: 4.3 mmol/L (ref 3.5–5.1)
Sodium: 141 mmol/L (ref 135–145)
Total Bilirubin: 0.6 mg/dL (ref 0.3–1.2)
Total Protein: 6.6 g/dL (ref 6.5–8.1)

## 2019-01-06 LAB — VITAMIN B12: Vitamin B-12: 726 pg/mL (ref 180–914)

## 2019-01-06 MED ORDER — ONDANSETRON HCL 4 MG PO TABS: 8.0000 mg | ORAL_TABLET | Freq: Once | ORAL | Status: DC

## 2019-01-06 MED ORDER — AZACITIDINE CHEMO SQ INJECTION
75.0000 mg/m2 | Freq: Once | INTRAMUSCULAR | Status: AC
  Administered 2019-01-06: 132.5 mg via SUBCUTANEOUS
  Filled 2019-01-06: qty 5.3

## 2019-01-07 ENCOUNTER — Inpatient Hospital Stay: Payer: Medicare Other

## 2019-01-07 ENCOUNTER — Other Ambulatory Visit: Payer: Medicare Other

## 2019-01-07 ENCOUNTER — Other Ambulatory Visit: Payer: Self-pay

## 2019-01-07 VITALS — BP 120/57 | HR 84 | Temp 97.6°F | Resp 18

## 2019-01-07 DIAGNOSIS — D46C Myelodysplastic syndrome with isolated del(5q) chromosomal abnormality: Secondary | ICD-10-CM | POA: Diagnosis not present

## 2019-01-07 DIAGNOSIS — D649 Anemia, unspecified: Secondary | ICD-10-CM | POA: Diagnosis not present

## 2019-01-07 DIAGNOSIS — E538 Deficiency of other specified B group vitamins: Secondary | ICD-10-CM | POA: Diagnosis not present

## 2019-01-07 DIAGNOSIS — N183 Chronic kidney disease, stage 3 (moderate): Secondary | ICD-10-CM | POA: Diagnosis not present

## 2019-01-07 DIAGNOSIS — D701 Agranulocytosis secondary to cancer chemotherapy: Secondary | ICD-10-CM | POA: Diagnosis not present

## 2019-01-07 DIAGNOSIS — E119 Type 2 diabetes mellitus without complications: Secondary | ICD-10-CM | POA: Diagnosis not present

## 2019-01-07 MED ORDER — AZACITIDINE CHEMO SQ INJECTION
75.0000 mg/m2 | Freq: Once | INTRAMUSCULAR | Status: AC
  Administered 2019-01-07: 132.5 mg via SUBCUTANEOUS
  Filled 2019-01-07: qty 5.3

## 2019-01-07 MED ORDER — ONDANSETRON HCL 4 MG PO TABS: 8.0000 mg | ORAL_TABLET | Freq: Once | ORAL | Status: DC

## 2019-01-08 ENCOUNTER — Inpatient Hospital Stay: Payer: Medicare Other | Attending: Oncology

## 2019-01-08 ENCOUNTER — Other Ambulatory Visit: Payer: Self-pay

## 2019-01-08 ENCOUNTER — Telehealth: Payer: Self-pay | Admitting: Oncology

## 2019-01-08 ENCOUNTER — Other Ambulatory Visit: Payer: Medicare Other

## 2019-01-08 VITALS — BP 129/65 | HR 78 | Temp 98.0°F | Resp 18

## 2019-01-08 DIAGNOSIS — E119 Type 2 diabetes mellitus without complications: Secondary | ICD-10-CM | POA: Diagnosis not present

## 2019-01-08 DIAGNOSIS — Z87891 Personal history of nicotine dependence: Secondary | ICD-10-CM | POA: Diagnosis not present

## 2019-01-08 DIAGNOSIS — Z79899 Other long term (current) drug therapy: Secondary | ICD-10-CM | POA: Insufficient documentation

## 2019-01-08 DIAGNOSIS — Z7984 Long term (current) use of oral hypoglycemic drugs: Secondary | ICD-10-CM | POA: Insufficient documentation

## 2019-01-08 DIAGNOSIS — E538 Deficiency of other specified B group vitamins: Secondary | ICD-10-CM | POA: Diagnosis not present

## 2019-01-08 DIAGNOSIS — E079 Disorder of thyroid, unspecified: Secondary | ICD-10-CM | POA: Diagnosis not present

## 2019-01-08 DIAGNOSIS — D46C Myelodysplastic syndrome with isolated del(5q) chromosomal abnormality: Secondary | ICD-10-CM | POA: Insufficient documentation

## 2019-01-08 DIAGNOSIS — D701 Agranulocytosis secondary to cancer chemotherapy: Secondary | ICD-10-CM | POA: Diagnosis not present

## 2019-01-08 DIAGNOSIS — Z5111 Encounter for antineoplastic chemotherapy: Secondary | ICD-10-CM | POA: Insufficient documentation

## 2019-01-08 MED ORDER — ONDANSETRON HCL 4 MG PO TABS: 8.0000 mg | ORAL_TABLET | Freq: Once | ORAL | Status: DC

## 2019-01-08 MED ORDER — AZACITIDINE CHEMO SQ INJECTION
75.0000 mg/m2 | Freq: Once | INTRAMUSCULAR | Status: AC
  Administered 2019-01-08: 14:00:00 132.5 mg via SUBCUTANEOUS
  Filled 2019-01-08: qty 5.3

## 2019-01-08 NOTE — Telephone Encounter (Signed)
Spoke with pt to confirm appt date/time, do pre-appt screen which was completed, and adv of Covid-19 guidelines for appt regarding screening questions, temperature check, face mask required, and no visitors allowed

## 2019-01-09 ENCOUNTER — Other Ambulatory Visit: Payer: Medicare Other

## 2019-01-09 ENCOUNTER — Inpatient Hospital Stay: Payer: Medicare Other

## 2019-01-09 ENCOUNTER — Other Ambulatory Visit: Payer: Self-pay

## 2019-01-09 VITALS — BP 117/59 | HR 89 | Temp 97.1°F | Resp 18

## 2019-01-09 DIAGNOSIS — D46C Myelodysplastic syndrome with isolated del(5q) chromosomal abnormality: Secondary | ICD-10-CM

## 2019-01-09 DIAGNOSIS — E119 Type 2 diabetes mellitus without complications: Secondary | ICD-10-CM | POA: Diagnosis not present

## 2019-01-09 DIAGNOSIS — E079 Disorder of thyroid, unspecified: Secondary | ICD-10-CM | POA: Diagnosis not present

## 2019-01-09 DIAGNOSIS — D701 Agranulocytosis secondary to cancer chemotherapy: Secondary | ICD-10-CM | POA: Diagnosis not present

## 2019-01-09 DIAGNOSIS — Z87891 Personal history of nicotine dependence: Secondary | ICD-10-CM | POA: Diagnosis not present

## 2019-01-09 DIAGNOSIS — Z5111 Encounter for antineoplastic chemotherapy: Secondary | ICD-10-CM | POA: Diagnosis not present

## 2019-01-09 MED ORDER — ONDANSETRON HCL 4 MG PO TABS: 8.0000 mg | ORAL_TABLET | Freq: Once | ORAL | Status: DC

## 2019-01-09 MED ORDER — AZACITIDINE CHEMO SQ INJECTION
75.0000 mg/m2 | Freq: Once | INTRAMUSCULAR | Status: AC
  Administered 2019-01-09: 132.5 mg via SUBCUTANEOUS
  Filled 2019-01-09: qty 5.3

## 2019-01-13 ENCOUNTER — Other Ambulatory Visit: Payer: Self-pay

## 2019-01-13 ENCOUNTER — Inpatient Hospital Stay: Payer: Medicare Other

## 2019-01-13 VITALS — BP 122/85 | HR 78 | Temp 96.9°F | Resp 18 | Wt 165.1 lb

## 2019-01-13 DIAGNOSIS — D46C Myelodysplastic syndrome with isolated del(5q) chromosomal abnormality: Secondary | ICD-10-CM | POA: Diagnosis not present

## 2019-01-13 DIAGNOSIS — Z87891 Personal history of nicotine dependence: Secondary | ICD-10-CM | POA: Diagnosis not present

## 2019-01-13 DIAGNOSIS — E079 Disorder of thyroid, unspecified: Secondary | ICD-10-CM | POA: Diagnosis not present

## 2019-01-13 DIAGNOSIS — D701 Agranulocytosis secondary to cancer chemotherapy: Secondary | ICD-10-CM | POA: Diagnosis not present

## 2019-01-13 DIAGNOSIS — E119 Type 2 diabetes mellitus without complications: Secondary | ICD-10-CM | POA: Diagnosis not present

## 2019-01-13 DIAGNOSIS — Z5111 Encounter for antineoplastic chemotherapy: Secondary | ICD-10-CM | POA: Diagnosis not present

## 2019-01-13 MED ORDER — ONDANSETRON HCL 4 MG PO TABS: 8.0000 mg | ORAL_TABLET | Freq: Once | ORAL | Status: DC

## 2019-01-13 MED ORDER — AZACITIDINE CHEMO SQ INJECTION
75.0000 mg/m2 | Freq: Once | INTRAMUSCULAR | Status: AC
  Administered 2019-01-13: 132.5 mg via SUBCUTANEOUS
  Filled 2019-01-13: qty 5.3

## 2019-01-15 ENCOUNTER — Other Ambulatory Visit: Payer: Self-pay | Admitting: Oncology

## 2019-01-15 ENCOUNTER — Other Ambulatory Visit: Payer: Self-pay | Admitting: Internal Medicine

## 2019-01-15 DIAGNOSIS — K219 Gastro-esophageal reflux disease without esophagitis: Secondary | ICD-10-CM

## 2019-01-30 ENCOUNTER — Other Ambulatory Visit: Payer: Self-pay | Admitting: Internal Medicine

## 2019-02-03 ENCOUNTER — Other Ambulatory Visit: Payer: Self-pay

## 2019-02-03 ENCOUNTER — Inpatient Hospital Stay: Payer: Medicare Other

## 2019-02-03 ENCOUNTER — Encounter: Payer: Self-pay | Admitting: Oncology

## 2019-02-03 ENCOUNTER — Inpatient Hospital Stay (HOSPITAL_BASED_OUTPATIENT_CLINIC_OR_DEPARTMENT_OTHER): Payer: Medicare Other | Admitting: Oncology

## 2019-02-03 VITALS — BP 115/66 | HR 74 | Temp 98.2°F | Resp 18 | Wt 164.4 lb

## 2019-02-03 DIAGNOSIS — E079 Disorder of thyroid, unspecified: Secondary | ICD-10-CM

## 2019-02-03 DIAGNOSIS — D46C Myelodysplastic syndrome with isolated del(5q) chromosomal abnormality: Secondary | ICD-10-CM

## 2019-02-03 DIAGNOSIS — D701 Agranulocytosis secondary to cancer chemotherapy: Secondary | ICD-10-CM

## 2019-02-03 DIAGNOSIS — Z87891 Personal history of nicotine dependence: Secondary | ICD-10-CM | POA: Diagnosis not present

## 2019-02-03 DIAGNOSIS — Z79899 Other long term (current) drug therapy: Secondary | ICD-10-CM | POA: Diagnosis not present

## 2019-02-03 DIAGNOSIS — D696 Thrombocytopenia, unspecified: Secondary | ICD-10-CM

## 2019-02-03 DIAGNOSIS — Z7984 Long term (current) use of oral hypoglycemic drugs: Secondary | ICD-10-CM | POA: Diagnosis not present

## 2019-02-03 DIAGNOSIS — D6189 Other specified aplastic anemias and other bone marrow failure syndromes: Secondary | ICD-10-CM

## 2019-02-03 DIAGNOSIS — T451X5A Adverse effect of antineoplastic and immunosuppressive drugs, initial encounter: Secondary | ICD-10-CM

## 2019-02-03 DIAGNOSIS — E119 Type 2 diabetes mellitus without complications: Secondary | ICD-10-CM

## 2019-02-03 DIAGNOSIS — Z5111 Encounter for antineoplastic chemotherapy: Secondary | ICD-10-CM | POA: Diagnosis not present

## 2019-02-03 DIAGNOSIS — E538 Deficiency of other specified B group vitamins: Secondary | ICD-10-CM

## 2019-02-03 LAB — COMPREHENSIVE METABOLIC PANEL
ALT: 11 U/L (ref 0–44)
AST: 15 U/L (ref 15–41)
Albumin: 3.5 g/dL (ref 3.5–5.0)
Alkaline Phosphatase: 101 U/L (ref 38–126)
Anion gap: 10 (ref 5–15)
BUN: 23 mg/dL (ref 8–23)
CO2: 22 mmol/L (ref 22–32)
Calcium: 8.5 mg/dL — ABNORMAL LOW (ref 8.9–10.3)
Chloride: 107 mmol/L (ref 98–111)
Creatinine, Ser: 1.14 mg/dL — ABNORMAL HIGH (ref 0.44–1.00)
GFR calc Af Amer: 53 mL/min — ABNORMAL LOW (ref >60)
GFR calc non Af Amer: 46 mL/min — ABNORMAL LOW (ref >60)
Glucose, Bld: 163 mg/dL — ABNORMAL HIGH (ref 70–99)
Potassium: 4.1 mmol/L (ref 3.5–5.1)
Sodium: 139 mmol/L (ref 135–145)
Total Bilirubin: 0.5 mg/dL (ref 0.3–1.2)
Total Protein: 6.4 g/dL — ABNORMAL LOW (ref 6.5–8.1)

## 2019-02-03 LAB — CBC WITH DIFFERENTIAL/PLATELET
Abs Immature Granulocytes: 0 10*3/uL (ref 0.00–0.07)
Basophils Absolute: 0 10*3/uL (ref 0.0–0.1)
Basophils Relative: 1 %
Eosinophils Absolute: 0.1 10*3/uL (ref 0.0–0.5)
Eosinophils Relative: 3 %
HCT: 27.3 % — ABNORMAL LOW (ref 36.0–46.0)
Hemoglobin: 8.6 g/dL — ABNORMAL LOW (ref 12.0–15.0)
Immature Granulocytes: 0 %
Lymphocytes Relative: 49 %
Lymphs Abs: 1.2 10*3/uL (ref 0.7–4.0)
MCH: 31 pg (ref 26.0–34.0)
MCHC: 31.5 g/dL (ref 30.0–36.0)
MCV: 98.6 fL (ref 80.0–100.0)
Monocytes Absolute: 0.1 10*3/uL (ref 0.1–1.0)
Monocytes Relative: 4 %
Neutro Abs: 1 10*3/uL — ABNORMAL LOW (ref 1.7–7.7)
Neutrophils Relative %: 43 %
Platelets: 106 10*3/uL — ABNORMAL LOW (ref 150–400)
RBC: 2.77 MIL/uL — ABNORMAL LOW (ref 3.87–5.11)
RDW: 20 % — ABNORMAL HIGH (ref 11.5–15.5)
WBC: 2.4 10*3/uL — ABNORMAL LOW (ref 4.0–10.5)
nRBC: 0 % (ref 0.0–0.2)

## 2019-02-03 NOTE — Progress Notes (Signed)
Patient does not offer any problems today.  

## 2019-02-03 NOTE — Progress Notes (Signed)
Hematology/Oncology Follow up note [facility] [phone] Fax:(336) [phone]   Patient Care Team: Glean Hess, MD as PCP - General (Internal Medicine) Earlie Server, MD as Consulting Physician (Oncology)  REFERRING PROVIDER: Glean Hess, MD  Previous Oncologist Dr.Srujitha Murkutla REASON FOR VISIT Follow up for treatment of MDS  HISTORY OF PRESENTING ILLNESS:  Yesenia Hart is a  78 y.o.  female who recently moved from [place] to [place].  She used to live at [place] with husband.  Accompanied by daughter, [name] who is a respiratory therapist at [employer].  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  Patient presented to emergency room in January 2019 with complaints of intermittent rectal bleeding and increased to have a hemoglobin of 3 and platelet counts was 59,000.  Received 5 units of PRBC and was discharged home with hemoglobin of 10.  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  Cauterization was deferred due to thrombocytopenia. Aspirin was discontinued due to GI bleeding. Peripheral blood flow, SPEP negative, no obvious signs of B12 deficiency.  Ultrasound of the abdomen showed mild fatty liver.  On October 04, 2017, patient underwent bone marrow biopsy. Bone marrow core biopsy, bone marrow aspirate clot, bone marrow aspirate smears showed  single linage dysplasia.  Less than 5% bone marrow blasts and no circulating blast. Iron storage is nearly absent Peripheral blood smear showed moderate and anisopoikilocytosis Marrow aspirate normal in  adenoid maturation Cytogenetics performed at the [facility] in [place] showed 46,xx, t(3;17;16)(p21;q21;q24), add (5) (q11,2), idic(22)(p11,2)[18]/46,xx[2].  20 metaphases, lites were normal and 18 metaphases 5 q.  deletion, it has translocation involving chromosome of 3, 17, 16, and iso-di centric 22.  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  Patient received IV iron infusion on moving to [place].  She has not had any treatment for MDS done due to the move.  Today patient is accompanied by her daughter to the clinic.  She reports feeling tired, no weight loss.  She reports easy bruising.  Intermittent epistaxis which spontaneously resolved after applying pressure denies any hematochezia, hematemesis, hemoptysis.  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 Although patient does have 5q deletion which if isolated, is a good cytogentic group, she carried other cytogentic abnormalities which increases her IPSS-R score.   #12/26/2018 repeat bone marrow biopsy results were discussed with patient. Bone marrow biopsy showed normocellular, dysplastic erythroid and megakaryocyte lineages.  Note increase of blast.  Consistent with MDS. MDS FISH panel showed deletion 5 q. Negative for monosomy 5, deletion 7 q., monosomy 7, trisomy 8 deletion 20 q. Cytogenetics reveals a single cell with del(3)(p21) and del(5)(q13q33).  INTERVAL HISTORY Yesenia Hart is a 78 y.o. female who has above history reviewed by me today presents for discussion of bone marrow biopsy results and assessment prior to MDS treatment with azacitidine.  Patient reports no new complaints today. Denies any fever, chills, nausea, vomiting, diarrhea, Chronic fatigue is at baseline. Appetite is good. Review of Systems  Constitutional: Positive for fatigue. Negative for appetite change, chills and fever.  HENT:   Negative for hearing loss and voice change.   Eyes: Negative for eye problems.  Respiratory: Negative for chest tightness and cough.   Cardiovascular: Negative for chest pain.  Gastrointestinal:  Negative for abdominal distention, abdominal  pain and blood in stool.  Endocrine: Negative for hot flashes.  Genitourinary: Negative for difficulty urinating and frequency.   Musculoskeletal: Negative for arthralgias.  Skin: Negative for itching and rash.  Neurological: Negative for extremity weakness.  Hematological: Negative for adenopathy.  Psychiatric/Behavioral: Negative for confusion.    MEDICAL HISTORY:  Past Medical History:  Diagnosis Date  . Anemia   . B12 deficiency 06/10/2018  . Clotting disorder ([place])   . Diabetes mellitus without complication ([place])   . IBS (irritable bowel syndrome)   . Iron deficiency anemia due to chronic blood loss 12/12/2017  . MDS (myelodysplastic syndrome) ([place])   . MDS (myelodysplastic syndrome) ([place])   . Thyroid disease     SURGICAL HISTORY: Past Surgical History:  Procedure Laterality Date  . APPENDECTOMY    . breast biopsy 54'    . CHOLECYSTECTOMY    . CORONARY ANGIOPLASTY WITH STENT PLACEMENT  2016   in [place]: Social History   Socioeconomic History  . Marital status: Married    Spouse name: Not on file  . Number of children: Not on file  . Years of education: Not on file  . Highest education level: Not on file  Occupational History  . Occupation: retired  Scientific laboratory technician  . Financial resource strain: Not on file  . Food insecurity    Worry: Not on file    Inability: Not on file  . Transportation needs    Medical: Not on file    Non-medical: Not on file  Tobacco Use  . Smoking status: Former Smoker    Packs/day: 1.00    Types: Cigarettes    Quit date: 1999    Years since quitting: 21.5  . Smokeless tobacco: Never Used  Substance and Sexual Activity  . Alcohol use: Not Currently  . Drug use: Never  . Sexual activity: Yes  Lifestyle  . Physical activity    Days per week: Not on file    Minutes per session: Not on file  . Stress: Not on file  Relationships  . Social Herbalist on phone: Not on file    Gets together: Not on file     Attends religious service: Not on file    Active member of club or organization: Not on file    Attends meetings of clubs or organizations: Not on file    Relationship status: Not on file  . Intimate partner violence    Fear of current or ex partner: Not on file    Emotionally abused: Not on file    Physically abused: Not on file    Forced sexual activity: Not on file  Other Topics Concern  . Not on file  Social History Narrative  . Not on file    FAMILY HISTORY: Family History  Adopted: Yes  Family history unknown: Yes    ALLERGIES:  is allergic to acarbose; demerol [meperidine hcl]; meperidine; and pioglitazone.  MEDICATIONS:  Current Outpatient Medications  Medication Sig Dispense Refill  . atorvastatin (LIPITOR) 20 MG tablet Take 1 tablet (20 mg total) by mouth daily. 90 tablet 1  . azaCITIDine in lactated ringers infusion Inject 100 mg/m2 into the vein daily.    . furosemide (LASIX) 20 MG tablet TAKE 1 TABLET BY MOUTH ONCE DAILY 30 tablet 5  . glimepiride (AMARYL) 4 MG tablet TAKE 1 TABLET BY MOUTH ONCE DAILY 90 tablet 1  . GNP VITAMIN B-12 1000  MCG TBCR TAKE 1 TABLET BY MOUTH ONCE DAILY 90 tablet 1  . Lancets (ONETOUCH ULTRASOFT) lancets Use as instructed 100 each 12  . levothyroxine (SYNTHROID, LEVOTHROID) 50 MCG tablet TAKE 1 TABLET BY MOUTH ONCE DAILY ON AN EMPTY STOMACH. WAIT 30 MINUTES BEFORE TAKING OTHER MEDS. 90 tablet 1  . loperamide (IMODIUM) 2 MG capsule Take 2 mg by mouth as needed for diarrhea or loose stools.    Marland Kitchen NOVOLOG MIX 70/30 FLEXPEN (70-30) 100 UNIT/ML FlexPen Inject 0.08 mLs (8 Units total) into the skin 2 (two) times daily with a meal. 21 mL 5  . nystatin (MYCOSTATIN/NYSTOP) powder nystatin 100,000 unit/gram topical powder   1 app by topical route.    . ondansetron (ZOFRAN) 8 MG tablet TAKE 1 TABLET BY MOUTH TWICE DAILY AS NEEDED NAUSEA AND VOMITING 30 tablet 1  . ONE TOUCH ULTRA TEST test strip TEST TWICE DAILY 200 each 1  . pantoprazole  (PROTONIX) 40 MG tablet TAKE 1 TABLET BY MOUTH ONCE DAILY 90 tablet 1  . quinapril (ACCUPRIL) 20 MG tablet TAKE 1 TABLET BY MOUTH ONCE DAILY 90 tablet 1  . sulfaSALAzine (AZULFIDINE) 500 MG tablet TAKE 1 TABLET BY MOUTH AT BEDTIME 90 tablet 1  . ULTICARE MICRO PEN NEEDLES 32G X 4 MM MISC Inject 1 each into the skin 2 (two) times daily. 100 each 12   No current facility-administered medications for this visit.      PHYSICAL EXAMINATION: ECOG PERFORMANCE STATUS: 1 - Symptomatic but completely ambulatory Vitals:   02/03/19 1306  BP: 115/66  Pulse: 74  Resp: 18  Temp: 98.2 F (36.8 C)   Filed Weights   02/03/19 1306  Weight: 164 lb 6.4 oz (74.6 kg)    Physical Exam Constitutional:      General: She is not in acute distress. HENT:     Head: Normocephalic and atraumatic.  Eyes:     General: No scleral icterus.    Conjunctiva/sclera: Conjunctivae normal.     Pupils: Pupils are equal, round, and reactive to light.  Neck:     Musculoskeletal: Normal range of motion and neck supple.  Cardiovascular:     Rate and Rhythm: Normal rate and regular rhythm.     Heart sounds: Normal heart sounds.  Pulmonary:     Effort: Pulmonary effort is normal. No respiratory distress.     Breath sounds: Normal breath sounds. No wheezing or rales.  Chest:     Chest wall: No tenderness.  Abdominal:     General: Bowel sounds are normal. There is no distension.     Palpations: Abdomen is soft. There is no mass.     Tenderness: There is no abdominal tenderness.  Musculoskeletal: Normal range of motion.        General: No deformity.  Lymphadenopathy:     Cervical: No cervical adenopathy.  Skin:    General: Skin is warm and dry.     Findings: No erythema or rash.  Neurological:     Mental Status: She is alert and oriented to person, place, and time.     Cranial Nerves: No cranial nerve deficit.     Coordination: Coordination normal.  Psychiatric:        Behavior: Behavior normal.         Thought Content: Thought content normal.      LABORATORY DATA:  I have reviewed the data as listed Lab Results  Component Value Date   WBC 2.4 (L) 02/03/2019   HGB 8.6 (L)  02/03/2019   HCT 27.3 (L) 02/03/2019   MCV 98.6 02/03/2019   PLT 106 (L) 02/03/2019   Recent Labs    12/23/18 1336 01/06/19 1351 02/03/19 1246  NA 136 141 139  K 4.6 4.3 4.1  CL 105 106 107  CO2 23 26 22   GLUCOSE 122* 101* 163*  BUN 19 16 23   CREATININE 1.15* 1.00 1.14*  CALCIUM 8.7* 8.5* 8.5*  GFRNONAA 45* 54* 46*  GFRAA 52* >60 53*  PROT 6.7 6.6 6.4*  ALBUMIN 3.7 3.5 3.5  AST 17 21 15   ALT 15 17 11   ALKPHOS 110 106 101  BILITOT 0.4 0.6 0.5       ASSESSMENT & PLAN:  1. MDS (myelodysplastic syndrome) with 5q deletion ([place])   2. Thrombocytopenia ([place])   3. 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 Chemotherapy-induced neutropenia ([facility])     # MDS IPSS-R high risk, >3 cytogenetics abnormality/platelet counts less than 50,000 Revlimid not indicated due to complex cytogenetics including- 5q deletion  Hold chemotherapy treatment due to neutropenia. Labs are reviewed and discussed with patient. #Neutropenia likely secondary to chemotherapy. ANC 1. Hold treatment.  Repeat blood work and reevaluate in 1 week . #History of vitamin B12 deficiency.  Vitamin B12 level 726.  RTC 1 week for reevaluation prior to chemotherapy.   Earlie Server, MD, PhD 02/03/2019

## 2019-02-04 ENCOUNTER — Inpatient Hospital Stay: Payer: Medicare Other

## 2019-02-05 ENCOUNTER — Inpatient Hospital Stay: Payer: Medicare Other

## 2019-02-06 ENCOUNTER — Inpatient Hospital Stay: Payer: Medicare Other

## 2019-02-07 ENCOUNTER — Inpatient Hospital Stay: Payer: Medicare Other

## 2019-02-10 ENCOUNTER — Inpatient Hospital Stay (HOSPITAL_BASED_OUTPATIENT_CLINIC_OR_DEPARTMENT_OTHER): Payer: Medicare Other | Admitting: Oncology

## 2019-02-10 ENCOUNTER — Inpatient Hospital Stay: Payer: Medicare Other

## 2019-02-10 ENCOUNTER — Other Ambulatory Visit: Payer: Self-pay | Admitting: *Deleted

## 2019-02-10 ENCOUNTER — Other Ambulatory Visit: Payer: Self-pay

## 2019-02-10 ENCOUNTER — Encounter: Payer: Self-pay | Admitting: Oncology

## 2019-02-10 ENCOUNTER — Inpatient Hospital Stay: Payer: Medicare Other | Attending: Oncology

## 2019-02-10 VITALS — BP 145/59 | HR 84 | Temp 98.2°F | Resp 20 | Wt 164.2 lb

## 2019-02-10 DIAGNOSIS — T451X5A Adverse effect of antineoplastic and immunosuppressive drugs, initial encounter: Secondary | ICD-10-CM | POA: Diagnosis not present

## 2019-02-10 DIAGNOSIS — Z5111 Encounter for antineoplastic chemotherapy: Secondary | ICD-10-CM | POA: Diagnosis not present

## 2019-02-10 DIAGNOSIS — Z87891 Personal history of nicotine dependence: Secondary | ICD-10-CM | POA: Diagnosis not present

## 2019-02-10 DIAGNOSIS — D696 Thrombocytopenia, unspecified: Secondary | ICD-10-CM

## 2019-02-10 DIAGNOSIS — D6189 Other specified aplastic anemias and other bone marrow failure syndromes: Secondary | ICD-10-CM

## 2019-02-10 DIAGNOSIS — E119 Type 2 diabetes mellitus without complications: Secondary | ICD-10-CM | POA: Diagnosis not present

## 2019-02-10 DIAGNOSIS — D701 Agranulocytosis secondary to cancer chemotherapy: Secondary | ICD-10-CM

## 2019-02-10 DIAGNOSIS — E079 Disorder of thyroid, unspecified: Secondary | ICD-10-CM | POA: Diagnosis not present

## 2019-02-10 DIAGNOSIS — Z7984 Long term (current) use of oral hypoglycemic drugs: Secondary | ICD-10-CM | POA: Diagnosis not present

## 2019-02-10 DIAGNOSIS — N179 Acute kidney failure, unspecified: Secondary | ICD-10-CM | POA: Diagnosis not present

## 2019-02-10 DIAGNOSIS — D46C Myelodysplastic syndrome with isolated del(5q) chromosomal abnormality: Secondary | ICD-10-CM

## 2019-02-10 DIAGNOSIS — Z79899 Other long term (current) drug therapy: Secondary | ICD-10-CM | POA: Diagnosis not present

## 2019-02-10 DIAGNOSIS — K529 Noninfective gastroenteritis and colitis, unspecified: Secondary | ICD-10-CM | POA: Diagnosis not present

## 2019-02-10 DIAGNOSIS — K5730 Diverticulosis of large intestine without perforation or abscess without bleeding: Secondary | ICD-10-CM

## 2019-02-10 DIAGNOSIS — R197 Diarrhea, unspecified: Secondary | ICD-10-CM

## 2019-02-10 LAB — CBC WITH DIFFERENTIAL/PLATELET
Abs Immature Granulocytes: 0.01 10*3/uL (ref 0.00–0.07)
Basophils Absolute: 0 10*3/uL (ref 0.0–0.1)
Basophils Relative: 0 %
Eosinophils Absolute: 0.1 10*3/uL (ref 0.0–0.5)
Eosinophils Relative: 3 %
HCT: 30.1 % — ABNORMAL LOW (ref 36.0–46.0)
Hemoglobin: 9.2 g/dL — ABNORMAL LOW (ref 12.0–15.0)
Immature Granulocytes: 0 %
Lymphocytes Relative: 40 %
Lymphs Abs: 1 10*3/uL (ref 0.7–4.0)
MCH: 30.3 pg (ref 26.0–34.0)
MCHC: 30.6 g/dL (ref 30.0–36.0)
MCV: 99 fL (ref 80.0–100.0)
Monocytes Absolute: 0.4 10*3/uL (ref 0.1–1.0)
Monocytes Relative: 14 %
Neutro Abs: 1.1 10*3/uL — ABNORMAL LOW (ref 1.7–7.7)
Neutrophils Relative %: 43 %
Platelets: 99 10*3/uL — ABNORMAL LOW (ref 150–400)
RBC: 3.04 MIL/uL — ABNORMAL LOW (ref 3.87–5.11)
RDW: 19.7 % — ABNORMAL HIGH (ref 11.5–15.5)
WBC: 2.5 10*3/uL — ABNORMAL LOW (ref 4.0–10.5)
nRBC: 0 % (ref 0.0–0.2)

## 2019-02-10 LAB — COMPREHENSIVE METABOLIC PANEL
ALT: 13 U/L (ref 0–44)
AST: 18 U/L (ref 15–41)
Albumin: 3.6 g/dL (ref 3.5–5.0)
Alkaline Phosphatase: 111 U/L (ref 38–126)
Anion gap: 9 (ref 5–15)
BUN: 18 mg/dL (ref 8–23)
CO2: 24 mmol/L (ref 22–32)
Calcium: 8.8 mg/dL — ABNORMAL LOW (ref 8.9–10.3)
Chloride: 107 mmol/L (ref 98–111)
Creatinine, Ser: 1.39 mg/dL — ABNORMAL HIGH (ref 0.44–1.00)
GFR calc Af Amer: 42 mL/min — ABNORMAL LOW (ref >60)
GFR calc non Af Amer: 36 mL/min — ABNORMAL LOW (ref >60)
Glucose, Bld: 161 mg/dL — ABNORMAL HIGH (ref 70–99)
Potassium: 4.3 mmol/L (ref 3.5–5.1)
Sodium: 140 mmol/L (ref 135–145)
Total Bilirubin: 0.5 mg/dL (ref 0.3–1.2)
Total Protein: 6.5 g/dL (ref 6.5–8.1)

## 2019-02-10 NOTE — Progress Notes (Signed)
Hematology/Oncology Follow up note [facility] Telephone:([phone] Fax:(336) [phone]   Patient Care Team: Glean Hess, MD as PCP - General (Internal Medicine) Earlie Server, MD as Consulting Physician (Oncology)  REFERRING PROVIDER: Glean Hess, MD  Previous Oncologist Dr.Srujitha Murkutla REASON FOR VISIT Follow up for treatment of MDS  HISTORY OF PRESENTING ILLNESS:  Yesenia Hart is a  78 y.o.  female who recently moved from [place] to [place].  She used to live at [place] with husband.  Accompanied by daughter, Yesenia Hart who is a respiratory therapist at [employer].  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  Patient presented to emergency room in January 2019 with complaints of intermittent rectal bleeding and increased to have a hemoglobin of 3 and platelet counts was 59,000.  Received 5 units of PRBC and was discharged home with hemoglobin of 10.  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  Cauterization was deferred due to thrombocytopenia. Aspirin was discontinued due to GI bleeding. Peripheral blood flow, SPEP negative, no obvious signs of B12 deficiency.  Ultrasound of the abdomen showed mild fatty liver.  On October 04, 2017, patient underwent bone marrow biopsy. Bone marrow core biopsy, bone marrow aspirate clot, bone marrow aspirate smears showed  single linage dysplasia.  Less than 5% bone marrow blasts and no circulating blast. Iron storage is nearly absent Peripheral blood smear showed moderate and anisopoikilocytosis Marrow aspirate normal in  adenoid maturation Cytogenetics performed at the [facility] in [place] showed 46,xx, t(3;17;16)(p21;q21;q24), add (5) (q11,2), idic(22)(p11,2)[18]/46,xx[2].  20 metaphases, lites were normal and 18 metaphases 5 q.  deletion, it has translocation involving chromosome of 3, 17, 16, and iso-di centric 22.  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  Patient received IV iron infusion on moving to [place].  She has not had any treatment for MDS done due to the move.  Today patient is accompanied by her daughter to the clinic.  She reports feeling tired, no weight loss.  She reports easy bruising.  Intermittent epistaxis which spontaneously resolved after applying pressure denies any hematochezia, hematemesis, hemoptysis.  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 Although patient does have 5q deletion which if isolated, is a good cytogentic group, she carried other cytogentic abnormalities which increases her IPSS-R score.   #12/26/2018 repeat bone marrow biopsy results were discussed with patient. Bone marrow biopsy showed normocellular, dysplastic erythroid and megakaryocyte lineages.  Note increase of blast.  Consistent with MDS. MDS FISH panel showed deletion 5 q. Negative for monosomy 5, deletion 7 q., monosomy 7, trisomy 8 deletion 20 q. Cytogenetics reveals a single cell with del(3)(p21) and del(5)(q13q33).  INTERVAL HISTORY Yesenia Hart is a 78 y.o. female who has above history reviewed by me today presents for discussion of bone marrow biopsy results and assessment prior to MDS treatment with azacitidine.  Patient reports that she had 2 episodes of diarrhea this morning. She has a chronic history of intermittent diarrhea, denies seeing any blood in the stool. She reports a history of " chronic colitis" that was diagnosed in [place].  She has been on sulfasalazine. She reports good appetite is been eating and drinking normally. Chronic fatigue at baseline.  Review of Systems  Constitutional: Positive for fatigue. Negative for appetite  change, chills and fever.  HENT:   Negative for hearing loss  and voice change.   Eyes: Negative for eye problems.  Respiratory: Negative for chest tightness and cough.   Cardiovascular: Negative for chest pain.  Gastrointestinal: Positive for diarrhea. Negative for abdominal distention, abdominal pain and blood in stool.  Endocrine: Negative for hot flashes.  Genitourinary: Negative for difficulty urinating and frequency.   Musculoskeletal: Negative for arthralgias.  Skin: Negative for itching and rash.  Neurological: Negative for extremity weakness.  Hematological: Negative for adenopathy.  Psychiatric/Behavioral: Negative for confusion.    MEDICAL HISTORY:  Past Medical History:  Diagnosis Date  . Anemia   . B12 deficiency 06/10/2018  . Clotting disorder ([place])   . Diabetes mellitus without complication ([place])   . IBS (irritable bowel syndrome)   . Iron deficiency anemia due to chronic blood loss 12/12/2017  . MDS (myelodysplastic syndrome) ([place])   . MDS (myelodysplastic syndrome) ([place])   . Thyroid disease     SURGICAL HISTORY: Past Surgical History:  Procedure Laterality Date  . APPENDECTOMY    . breast biopsy 52'    . CHOLECYSTECTOMY    . CORONARY ANGIOPLASTY WITH STENT PLACEMENT  2016   in [place]: Social History   Socioeconomic History  . Marital status: Married    Spouse name: Not on file  . Number of children: Not on file  . Years of education: Not on file  . Highest education level: Not on file  Occupational History  . Occupation: retired  Scientific laboratory technician  . Financial resource strain: Not on file  . Food insecurity    Worry: Not on file    Inability: Not on file  . Transportation needs    Medical: Not on file    Non-medical: Not on file  Tobacco Use  . Smoking status: Former Smoker    Packs/day: 1.00    Types: Cigarettes    Quit date: 1999    Years since quitting: 21.6  . Smokeless tobacco: Never Used  Substance and Sexual Activity  . Alcohol use: Not Currently  . Drug use: Never  . Sexual  activity: Yes  Lifestyle  . Physical activity    Days per week: Not on file    Minutes per session: Not on file  . Stress: Not on file  Relationships  . Social Herbalist on phone: Not on file    Gets together: Not on file    Attends religious service: Not on file    Active member of club or organization: Not on file    Attends meetings of clubs or organizations: Not on file    Relationship status: Not on file  . Intimate partner violence    Fear of current or ex partner: Not on file    Emotionally abused: Not on file    Physically abused: Not on file    Forced sexual activity: Not on file  Other Topics Concern  . Not on file  Social History Narrative  . Not on file    FAMILY HISTORY: Family History  Adopted: Yes  Family history unknown: Yes    ALLERGIES:  is allergic to acarbose; demerol [meperidine hcl]; meperidine; and pioglitazone.  MEDICATIONS:  Current Outpatient Medications  Medication Sig Dispense Refill  . atorvastatin (LIPITOR) 20 MG tablet Take 1 tablet (20 mg total) by mouth daily. 90 tablet 1  . azaCITIDine in lactated ringers infusion Inject 100 mg/m2 into the vein daily.    Marland Kitchen  furosemide (LASIX) 20 MG tablet TAKE 1 TABLET BY MOUTH ONCE DAILY 30 tablet 5  . glimepiride (AMARYL) 4 MG tablet TAKE 1 TABLET BY MOUTH ONCE DAILY 90 tablet 1  . GNP VITAMIN B-12 1000 MCG TBCR TAKE 1 TABLET BY MOUTH ONCE DAILY 90 tablet 1  . Lancets (ONETOUCH ULTRASOFT) lancets Use as instructed 100 each 12  . levothyroxine (SYNTHROID, LEVOTHROID) 50 MCG tablet TAKE 1 TABLET BY MOUTH ONCE DAILY ON AN EMPTY STOMACH. WAIT 30 MINUTES BEFORE TAKING OTHER MEDS. 90 tablet 1  . loperamide (IMODIUM) 2 MG capsule Take 2 mg by mouth as needed for diarrhea or loose stools.    Marland Kitchen NOVOLOG MIX 70/30 FLEXPEN (70-30) 100 UNIT/ML FlexPen Inject 0.08 mLs (8 Units total) into the skin 2 (two) times daily with a meal. 21 mL 5  . nystatin (MYCOSTATIN/NYSTOP) powder nystatin 100,000 unit/gram  topical powder   1 app by topical route.    . ondansetron (ZOFRAN) 8 MG tablet TAKE 1 TABLET BY MOUTH TWICE DAILY AS NEEDED NAUSEA AND VOMITING 30 tablet 1  . ONE TOUCH ULTRA TEST test strip TEST TWICE DAILY 200 each 1  . pantoprazole (PROTONIX) 40 MG tablet TAKE 1 TABLET BY MOUTH ONCE DAILY 90 tablet 1  . quinapril (ACCUPRIL) 20 MG tablet TAKE 1 TABLET BY MOUTH ONCE DAILY 90 tablet 1  . sulfaSALAzine (AZULFIDINE) 500 MG tablet TAKE 1 TABLET BY MOUTH AT BEDTIME 90 tablet 1  . ULTICARE MICRO PEN NEEDLES 32G X 4 MM MISC Inject 1 each into the skin 2 (two) times daily. 100 each 12   No current facility-administered medications for this visit.      PHYSICAL EXAMINATION: ECOG PERFORMANCE STATUS: 1 - Symptomatic but completely ambulatory Vitals:   02/10/19 1313  BP: (!) 145/59  Pulse: 84  Resp: 20  Temp: 98.2 F (36.8 C)   Filed Weights   02/10/19 1313  Weight: 164 lb 3.2 oz (74.5 kg)    Physical Exam Constitutional:      General: She is not in acute distress. HENT:     Head: Normocephalic and atraumatic.  Eyes:     General: No scleral icterus.    Conjunctiva/sclera: Conjunctivae normal.     Pupils: Pupils are equal, round, and reactive to light.  Neck:     Musculoskeletal: Normal range of motion and neck supple.  Cardiovascular:     Rate and Rhythm: Normal rate and regular rhythm.     Heart sounds: Normal heart sounds.  Pulmonary:     Effort: Pulmonary effort is normal. No respiratory distress.     Breath sounds: Normal breath sounds. No wheezing or rales.  Chest:     Chest wall: No tenderness.  Abdominal:     General: Bowel sounds are normal. There is no distension.     Palpations: Abdomen is soft. There is no mass.     Tenderness: There is no abdominal tenderness.  Musculoskeletal: Normal range of motion.        General: No deformity.  Lymphadenopathy:     Cervical: No cervical adenopathy.  Skin:    General: Skin is warm and dry.     Findings: No erythema or  rash.  Neurological:     Mental Status: She is alert and oriented to person, place, and time.     Cranial Nerves: No cranial nerve deficit.     Coordination: Coordination normal.  Psychiatric:        Behavior: Behavior normal.  Thought Content: Thought content normal.      LABORATORY DATA:  I have reviewed the data as listed Lab Results  Component Value Date   WBC 2.5 (L) 02/10/2019   HGB 9.2 (L) 02/10/2019   HCT 30.1 (L) 02/10/2019   MCV 99.0 02/10/2019   PLT 99 (L) 02/10/2019   Recent Labs    01/06/19 1351 02/03/19 1246 02/10/19 1250  NA 141 139 140  K 4.3 4.1 4.3  CL 106 107 107  CO2 26 22 24   GLUCOSE 101* 163* 161*  BUN 16 23 18   CREATININE 1.00 1.14* 1.39*  CALCIUM 8.5* 8.5* 8.8*  GFRNONAA 54* 46* 36*  GFRAA >60 53* 42*  PROT 6.6 6.4* 6.5  ALBUMIN 3.5 3.5 3.6  AST 21 15 18   ALT 17 11 13   ALKPHOS 106 101 111  BILITOT 0.6 0.5 0.5       ASSESSMENT & PLAN:  1. MDS (myelodysplastic syndrome) with 5q deletion ([place])   2. Diarrhea, unspecified type   3. Thrombocytopenia ([place])   4. 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 Chemotherapy-induced neutropenia ([facility])   6. Chronic diarrhea     # MDS IPSS-R high risk, >3 cytogenetics abnormality/platelet counts less than 50,000 Revlimid not indicated due to complex cytogenetics including- 5q deletion  Patient has been doing well on azacitidine, she had a good response with improvement of thrombocytopenia  Developed fluctuating levels of thrombocytopenia and neutropenia since March 2020. Repeat bone marrow biopsy in June 2020 did not show any increased blast. Hold chemotherapy today.  See below.  #AKI, most likely secondary to intermittent diarrhea. Offered patient IV fluid hydration.  Patient preferred to try oral hydration.  #Chronic diarrhea, reported history of being diagnosed with chronic colitis [place].  Patient is on sulfasalazine.  Has not followed up with gastroenterology since moved  to [facility] in 2019. Recommend patient to establish care with gastroenterology.  Refer to Dr. Vicente Males. Chronic colitis/flare may have contributed to her cytopenia.  #Chronic anemia, thrombocytopenia neutropenia likely secondary to chemotherapy, may be also secondary to colitis flare. ANC 1.1 RTC 1 week for reevaluation prior to chemotherapy.   Earlie Server, MD, PhD 02/10/2019

## 2019-02-10 NOTE — Progress Notes (Signed)
Patient has history of colitis and had 2 episodes of diarrhea this morning which makes her feel tired.

## 2019-02-11 ENCOUNTER — Inpatient Hospital Stay: Payer: Medicare Other

## 2019-02-12 ENCOUNTER — Inpatient Hospital Stay: Payer: Medicare Other

## 2019-02-12 ENCOUNTER — Other Ambulatory Visit: Payer: Self-pay

## 2019-02-12 DIAGNOSIS — R197 Diarrhea, unspecified: Secondary | ICD-10-CM

## 2019-02-12 DIAGNOSIS — N179 Acute kidney failure, unspecified: Secondary | ICD-10-CM | POA: Diagnosis not present

## 2019-02-12 DIAGNOSIS — D701 Agranulocytosis secondary to cancer chemotherapy: Secondary | ICD-10-CM | POA: Diagnosis not present

## 2019-02-12 DIAGNOSIS — D696 Thrombocytopenia, unspecified: Secondary | ICD-10-CM | POA: Diagnosis not present

## 2019-02-12 DIAGNOSIS — D46C Myelodysplastic syndrome with isolated del(5q) chromosomal abnormality: Secondary | ICD-10-CM | POA: Diagnosis not present

## 2019-02-12 DIAGNOSIS — Z5111 Encounter for antineoplastic chemotherapy: Secondary | ICD-10-CM | POA: Diagnosis not present

## 2019-02-12 LAB — C DIFFICILE QUICK SCREEN W PCR REFLEX
C Diff antigen: NEGATIVE
C Diff interpretation: NOT DETECTED
C Diff toxin: NEGATIVE

## 2019-02-13 ENCOUNTER — Inpatient Hospital Stay: Payer: Medicare Other

## 2019-02-14 ENCOUNTER — Inpatient Hospital Stay: Payer: Medicare Other

## 2019-02-17 ENCOUNTER — Inpatient Hospital Stay (HOSPITAL_BASED_OUTPATIENT_CLINIC_OR_DEPARTMENT_OTHER): Payer: Medicare Other | Admitting: Oncology

## 2019-02-17 ENCOUNTER — Inpatient Hospital Stay: Payer: Medicare Other

## 2019-02-17 ENCOUNTER — Other Ambulatory Visit: Payer: Self-pay

## 2019-02-17 ENCOUNTER — Encounter: Payer: Self-pay | Admitting: Oncology

## 2019-02-17 VITALS — BP 141/68 | HR 62 | Temp 98.6°F | Resp 18 | Wt 165.5 lb

## 2019-02-17 DIAGNOSIS — N179 Acute kidney failure, unspecified: Secondary | ICD-10-CM | POA: Diagnosis not present

## 2019-02-17 DIAGNOSIS — Z5111 Encounter for antineoplastic chemotherapy: Secondary | ICD-10-CM | POA: Diagnosis not present

## 2019-02-17 DIAGNOSIS — D46C Myelodysplastic syndrome with isolated del(5q) chromosomal abnormality: Secondary | ICD-10-CM

## 2019-02-17 DIAGNOSIS — R197 Diarrhea, unspecified: Secondary | ICD-10-CM

## 2019-02-17 DIAGNOSIS — D701 Agranulocytosis secondary to cancer chemotherapy: Secondary | ICD-10-CM | POA: Diagnosis not present

## 2019-02-17 DIAGNOSIS — D696 Thrombocytopenia, unspecified: Secondary | ICD-10-CM | POA: Diagnosis not present

## 2019-02-17 LAB — CBC WITH DIFFERENTIAL/PLATELET
Abs Immature Granulocytes: 0.01 10*3/uL (ref 0.00–0.07)
Basophils Absolute: 0 10*3/uL (ref 0.0–0.1)
Basophils Relative: 1 %
Eosinophils Absolute: 0.1 10*3/uL (ref 0.0–0.5)
Eosinophils Relative: 3 %
HCT: 31 % — ABNORMAL LOW (ref 36.0–46.0)
Hemoglobin: 9.6 g/dL — ABNORMAL LOW (ref 12.0–15.0)
Immature Granulocytes: 0 %
Lymphocytes Relative: 37 %
Lymphs Abs: 1.6 10*3/uL (ref 0.7–4.0)
MCH: 30.3 pg (ref 26.0–34.0)
MCHC: 31 g/dL (ref 30.0–36.0)
MCV: 97.8 fL (ref 80.0–100.0)
Monocytes Absolute: 0.6 10*3/uL (ref 0.1–1.0)
Monocytes Relative: 13 %
Neutro Abs: 2.1 10*3/uL (ref 1.7–7.7)
Neutrophils Relative %: 46 %
Platelets: 71 10*3/uL — ABNORMAL LOW (ref 150–400)
RBC: 3.17 MIL/uL — ABNORMAL LOW (ref 3.87–5.11)
RDW: 19.3 % — ABNORMAL HIGH (ref 11.5–15.5)
WBC: 4.4 10*3/uL (ref 4.0–10.5)
nRBC: 0 % (ref 0.0–0.2)

## 2019-02-17 LAB — COMPREHENSIVE METABOLIC PANEL
ALT: 14 U/L (ref 0–44)
AST: 17 U/L (ref 15–41)
Albumin: 3.6 g/dL (ref 3.5–5.0)
Alkaline Phosphatase: 116 U/L (ref 38–126)
Anion gap: 8 (ref 5–15)
BUN: 24 mg/dL — ABNORMAL HIGH (ref 8–23)
CO2: 24 mmol/L (ref 22–32)
Calcium: 8.7 mg/dL — ABNORMAL LOW (ref 8.9–10.3)
Chloride: 105 mmol/L (ref 98–111)
Creatinine, Ser: 1.08 mg/dL — ABNORMAL HIGH (ref 0.44–1.00)
GFR calc Af Amer: 57 mL/min — ABNORMAL LOW (ref >60)
GFR calc non Af Amer: 49 mL/min — ABNORMAL LOW (ref >60)
Glucose, Bld: 202 mg/dL — ABNORMAL HIGH (ref 70–99)
Potassium: 4.4 mmol/L (ref 3.5–5.1)
Sodium: 137 mmol/L (ref 135–145)
Total Bilirubin: 0.5 mg/dL (ref 0.3–1.2)
Total Protein: 6.7 g/dL (ref 6.5–8.1)

## 2019-02-17 MED ORDER — ONDANSETRON HCL 4 MG PO TABS: 8.0000 mg | ORAL_TABLET | Freq: Once | ORAL | Status: DC

## 2019-02-17 MED ORDER — AZACITIDINE CHEMO SQ INJECTION
75.0000 mg/m2 | Freq: Once | INTRAMUSCULAR | Status: AC
  Administered 2019-02-17: 132.5 mg via SUBCUTANEOUS
  Filled 2019-02-17: qty 5.3

## 2019-02-17 NOTE — Progress Notes (Signed)
Hematology/Oncology Follow up note [facility] Telephone:([phone] [phone]   Patient Care Team: Glean Hess, MD as PCP - General (Internal Medicine) Earlie Server, MD as Consulting Physician (Oncology)  REFERRING PROVIDER: Glean Hess, MD  Previous Oncologist Dr.Srujitha Murkutla REASON FOR VISIT Follow up for treatment of MDS  HISTORY OF PRESENTING ILLNESS:  Yesenia Hart is a  78 y.o.  female who recently moved from [place] to [place].  She used to live at [place] with husband.  Accompanied by daughter, [name] who is a respiratory therapist at [employer].  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  Patient presented to emergency room in January 2019 with complaints of intermittent rectal bleeding and increased to have a hemoglobin of 3 and platelet counts was 59,000.  Received 5 units of PRBC and was discharged home with hemoglobin of 10.  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  Cauterization was deferred due to thrombocytopenia. Aspirin was discontinued due to GI bleeding. Peripheral blood flow, SPEP negative, no obvious signs of B12 deficiency.  Ultrasound of the abdomen showed mild fatty liver.  On October 04, 2017, patient underwent bone marrow biopsy. Bone marrow core biopsy, bone marrow aspirate clot, bone marrow aspirate smears showed  single linage dysplasia.  Less than 5% bone marrow blasts and no circulating blast. Iron storage is nearly absent Peripheral blood smear showed moderate and anisopoikilocytosis Marrow aspirate normal in  adenoid maturation Cytogenetics performed at the [facility] in [place] showed 46,xx, t(3;17;16)(p21;q21;q24), add (5) (q11,2), idic(22)(p11,2)[18]/46,xx[2].  20 metaphases, lites were normal and 18 metaphases 5 q.  deletion, it has translocation involving chromosome of 3, 17, 16, and iso-di centric 22.  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  Patient received IV iron infusion on moving to [place].  She has not had any treatment for MDS done due to the move.  Today patient is accompanied by her daughter to the clinic.  She reports feeling tired, no weight loss.  She reports easy bruising.  Intermittent epistaxis which spontaneously resolved after applying pressure denies any hematochezia, hematemesis, hemoptysis.  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 Although patient does have 5q deletion which if isolated, is a good cytogentic group, she carried other cytogentic abnormalities which increases her IPSS-R score.   #12/26/2018 repeat bone marrow biopsy results were discussed with patient. Bone marrow biopsy showed normocellular, dysplastic erythroid and megakaryocyte lineages.  Note increase of blast.  Consistent with MDS. MDS FISH panel showed deletion 5 q. Negative for monosomy 5, deletion 7 q., monosomy 7, trisomy 8 deletion 20 q. Cytogenetics reveals a single cell with del(3)(p21) and del(5)(q13q33).  INTERVAL HISTORY Yesenia Hart is a 78 y.o. female who has above history reviewed by me today presents for discussion of bone marrow biopsy results and assessment prior to MDS treatment with azacitidine.  She continues to have intermittent diarrhea episodes.  Has not had any diarrhea today.  Chronic colitis on sulfasalazine.  She has been referred to gastroenterology and has an appointment set up already. Denies any fever, chills, abdominal pain. C. difficile was checked and was negative. Chronic fatigue at baseline.  Review of Systems  Constitutional: Positive for fatigue. Negative for appetite change, chills and fever.  HENT:   Negative  for hearing loss and voice change.   Eyes: Negative for  eye problems.  Respiratory: Negative for chest tightness and cough.   Cardiovascular: Negative for chest pain.  Gastrointestinal: Positive for diarrhea. Negative for abdominal distention, abdominal pain and blood in stool.  Endocrine: Negative for hot flashes.  Genitourinary: Negative for difficulty urinating and frequency.   Musculoskeletal: Negative for arthralgias.  Skin: Negative for itching and rash.  Neurological: Negative for extremity weakness.  Hematological: Negative for adenopathy.  Psychiatric/Behavioral: Negative for confusion.    MEDICAL HISTORY:  Past Medical History:  Diagnosis Date  . Anemia   . B12 deficiency 06/10/2018  . Clotting disorder ([place])   . Diabetes mellitus without complication ([place])   . IBS (irritable bowel syndrome)   . Iron deficiency anemia due to chronic blood loss 12/12/2017  . MDS (myelodysplastic syndrome) ([place])   . MDS (myelodysplastic syndrome) ([place])   . Thyroid disease     SURGICAL HISTORY: Past Surgical History:  Procedure Laterality Date  . APPENDECTOMY    . breast biopsy 94'    . CHOLECYSTECTOMY    . CORONARY ANGIOPLASTY WITH STENT PLACEMENT  2016   in [place]: Social History   Socioeconomic History  . Marital status: Married    Spouse name: Not on file  . Number of children: Not on file  . Years of education: Not on file  . Highest education level: Not on file  Occupational History  . Occupation: retired  Scientific laboratory technician  . Financial resource strain: Not on file  . Food insecurity    Worry: Not on file    Inability: Not on file  . Transportation needs    Medical: Not on file    Non-medical: Not on file  Tobacco Use  . Smoking status: Former Smoker    Packs/day: 1.00    Types: Cigarettes    Quit date: 1999    Years since quitting: 21.6  . Smokeless tobacco: Never Used  Substance and Sexual Activity  . Alcohol use: Not Currently  . Drug use: Never  . Sexual activity: Yes  Lifestyle  . Physical  activity    Days per week: Not on file    Minutes per session: Not on file  . Stress: Not on file  Relationships  . Social Herbalist on phone: Not on file    Gets together: Not on file    Attends religious service: Not on file    Active member of club or organization: Not on file    Attends meetings of clubs or organizations: Not on file    Relationship status: Not on file  . Intimate partner violence    Fear of current or ex partner: Not on file    Emotionally abused: Not on file    Physically abused: Not on file    Forced sexual activity: Not on file  Other Topics Concern  . Not on file  Social History Narrative  . Not on file    FAMILY HISTORY: Family History  Adopted: Yes  Family history unknown: Yes    ALLERGIES:  is allergic to acarbose; demerol [meperidine hcl]; meperidine; and pioglitazone.  MEDICATIONS:  Current Outpatient Medications  Medication Sig Dispense Refill  . atorvastatin (LIPITOR) 20 MG tablet Take 1 tablet (20 mg total) by mouth daily. 90 tablet 1  . azaCITIDine in lactated ringers infusion Inject 100 mg/m2 into the vein daily.    . furosemide (LASIX) 20 MG tablet  TAKE 1 TABLET BY MOUTH ONCE DAILY 30 tablet 5  . glimepiride (AMARYL) 4 MG tablet TAKE 1 TABLET BY MOUTH ONCE DAILY 90 tablet 1  . GNP VITAMIN B-12 1000 MCG TBCR TAKE 1 TABLET BY MOUTH ONCE DAILY 90 tablet 1  . Lancets (ONETOUCH ULTRASOFT) lancets Use as instructed 100 each 12  . levothyroxine (SYNTHROID, LEVOTHROID) 50 MCG tablet TAKE 1 TABLET BY MOUTH ONCE DAILY ON AN EMPTY STOMACH. WAIT 30 MINUTES BEFORE TAKING OTHER MEDS. 90 tablet 1  . loperamide (IMODIUM) 2 MG capsule Take 2 mg by mouth as needed for diarrhea or loose stools.    Marland Kitchen NOVOLOG MIX 70/30 FLEXPEN (70-30) 100 UNIT/ML FlexPen Inject 0.08 mLs (8 Units total) into the skin 2 (two) times daily with a meal. 21 mL 5  . nystatin (MYCOSTATIN/NYSTOP) powder nystatin 100,000 unit/gram topical powder   1 app by topical route.     . ondansetron (ZOFRAN) 8 MG tablet TAKE 1 TABLET BY MOUTH TWICE DAILY AS NEEDED NAUSEA AND VOMITING 30 tablet 1  . ONE TOUCH ULTRA TEST test strip TEST TWICE DAILY 200 each 1  . pantoprazole (PROTONIX) 40 MG tablet TAKE 1 TABLET BY MOUTH ONCE DAILY 90 tablet 1  . quinapril (ACCUPRIL) 20 MG tablet TAKE 1 TABLET BY MOUTH ONCE DAILY 90 tablet 1  . sulfaSALAzine (AZULFIDINE) 500 MG tablet TAKE 1 TABLET BY MOUTH AT BEDTIME 90 tablet 1  . ULTICARE MICRO PEN NEEDLES 32G X 4 MM MISC Inject 1 each into the skin 2 (two) times daily. 100 each 12   No current facility-administered medications for this visit.      PHYSICAL EXAMINATION: ECOG PERFORMANCE STATUS: 1 - Symptomatic but completely ambulatory Vitals:   02/17/19 1356  BP: (!) 141/68  Pulse: 62  Resp: 18  Temp: 98.6 F (37 C)   Filed Weights   02/17/19 1356  Weight: 165 lb 8 oz (75.1 kg)    Physical Exam Constitutional:      General: She is not in acute distress. HENT:     Head: Normocephalic and atraumatic.  Eyes:     General: No scleral icterus.    Conjunctiva/sclera: Conjunctivae normal.     Pupils: Pupils are equal, round, and reactive to light.  Neck:     Musculoskeletal: Normal range of motion and neck supple.  Cardiovascular:     Rate and Rhythm: Normal rate and regular rhythm.     Heart sounds: Normal heart sounds.  Pulmonary:     Effort: Pulmonary effort is normal. No respiratory distress.     Breath sounds: Normal breath sounds. No wheezing or rales.  Chest:     Chest wall: No tenderness.  Abdominal:     General: Bowel sounds are normal. There is no distension.     Palpations: Abdomen is soft. There is no mass.     Tenderness: There is no abdominal tenderness.  Musculoskeletal: Normal range of motion.        General: No deformity.  Lymphadenopathy:     Cervical: No cervical adenopathy.  Skin:    General: Skin is warm and dry.     Findings: No erythema or rash.  Neurological:     Mental Status: She is  alert and oriented to person, place, and time.     Cranial Nerves: No cranial nerve deficit.     Coordination: Coordination normal.  Psychiatric:        Behavior: Behavior normal.        Thought Content: Thought  content normal.      LABORATORY DATA:  I have reviewed the data as listed Lab Results  Component Value Date   WBC 4.4 02/17/2019   HGB 9.6 (L) 02/17/2019   HCT 31.0 (L) 02/17/2019   MCV 97.8 02/17/2019   PLT 71 (L) 02/17/2019   Recent Labs    02/03/19 1246 02/10/19 1250 02/17/19 1336  NA 139 140 137  K 4.1 4.3 4.4  CL 107 107 105  CO2 _0 GLUCOSE 163* 161* 202*  BUN 23 18 24*  CREATININE 1.14* 1.39* 1.08*  CALCIUM 8.5* 8.8* 8.7*  GFRNONAA 46* 36* 49*  GFRAA 53* 42* 57*  PROT 6.4* 6.5 6.7  ALBUMIN 3.5 3.6 3.6  AST _1 ALT _2 ALKPHOS 101 111 116  BILITOT 0.5 0.5 0.5       ASSESSMENT & PLAN:  1. 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 Thrombocytopenia ([place])   3. Diarrhea, unspecified type   4. Encounter for antineoplastic chemotherapy     # MDS IPSS-R high risk, >3 cytogenetics abnormality/platelet counts less than 50,000 Revlimid not indicated due to complex cytogenetics including- 5q deletion  Patient has been doing well on azacitidine, she had a good response with improvement of thrombocytopenia  Developed fluctuating levels of thrombocytopenia and neutropenia since March 2020. Repeat bone marrow biopsy in June 2020 did not show any increased blast.  Labs are reviewed and discussed with patient.  Proceed with chemotherapy today.  Azacitidine day 1 to day 5.  #AKI, resolved. #Chronic diarrhea, reported history of being diagnosed with chronic colitis in the well.  Patient is on sulfasalazine.  Has not followed up with gastroenterology since moved to [facility] in 2019. She has an appointment with Dr. Vicente Males.  #Chronic anemia, thrombocytopenia neutropenia likely secondary to chemotherapy, may be also  secondary to colitis flare.  Neutropenia has resolved.  WBC 4.4.  ANC 2.1 today. RTC 2 weeks for repeat labs, 4 weeks week for reevaluation prior to chemotherapy. Orders Placed This Encounter  Procedures  . CBC with Differential/Platelet    Standing Status:   Future    Standing Expiration Date:   02/17/2020  . Sample to Blood Bank    Standing Status:   Future    Standing Expiration Date:   02/17/2020    Earlie Server, MD, PhD 02/17/2019

## 2019-02-17 NOTE — Progress Notes (Signed)
Patient does not offer any problems today.  

## 2019-02-18 ENCOUNTER — Inpatient Hospital Stay: Payer: Medicare Other

## 2019-02-18 ENCOUNTER — Other Ambulatory Visit: Payer: Self-pay

## 2019-02-18 VITALS — BP 137/58 | HR 97 | Temp 99.0°F | Resp 18

## 2019-02-18 DIAGNOSIS — R197 Diarrhea, unspecified: Secondary | ICD-10-CM | POA: Diagnosis not present

## 2019-02-18 DIAGNOSIS — D696 Thrombocytopenia, unspecified: Secondary | ICD-10-CM | POA: Diagnosis not present

## 2019-02-18 DIAGNOSIS — D701 Agranulocytosis secondary to cancer chemotherapy: Secondary | ICD-10-CM | POA: Diagnosis not present

## 2019-02-18 DIAGNOSIS — Z5111 Encounter for antineoplastic chemotherapy: Secondary | ICD-10-CM | POA: Diagnosis not present

## 2019-02-18 DIAGNOSIS — D46C Myelodysplastic syndrome with isolated del(5q) chromosomal abnormality: Secondary | ICD-10-CM | POA: Diagnosis not present

## 2019-02-18 DIAGNOSIS — N179 Acute kidney failure, unspecified: Secondary | ICD-10-CM | POA: Diagnosis not present

## 2019-02-18 MED ORDER — ONDANSETRON HCL 4 MG PO TABS: 8.0000 mg | ORAL_TABLET | Freq: Once | ORAL | Status: DC

## 2019-02-18 MED ORDER — AZACITIDINE CHEMO SQ INJECTION
75.0000 mg/m2 | Freq: Once | INTRAMUSCULAR | Status: AC
  Administered 2019-02-18: 132.5 mg via SUBCUTANEOUS
  Filled 2019-02-18: qty 5.3

## 2019-02-19 ENCOUNTER — Inpatient Hospital Stay: Payer: Medicare Other

## 2019-02-19 ENCOUNTER — Other Ambulatory Visit: Payer: Self-pay

## 2019-02-19 VITALS — BP 154/70 | HR 100 | Temp 98.3°F | Resp 18

## 2019-02-19 DIAGNOSIS — D46C Myelodysplastic syndrome with isolated del(5q) chromosomal abnormality: Secondary | ICD-10-CM

## 2019-02-19 DIAGNOSIS — N179 Acute kidney failure, unspecified: Secondary | ICD-10-CM | POA: Diagnosis not present

## 2019-02-19 DIAGNOSIS — Z5111 Encounter for antineoplastic chemotherapy: Secondary | ICD-10-CM | POA: Diagnosis not present

## 2019-02-19 DIAGNOSIS — R197 Diarrhea, unspecified: Secondary | ICD-10-CM | POA: Diagnosis not present

## 2019-02-19 DIAGNOSIS — D701 Agranulocytosis secondary to cancer chemotherapy: Secondary | ICD-10-CM | POA: Diagnosis not present

## 2019-02-19 DIAGNOSIS — D696 Thrombocytopenia, unspecified: Secondary | ICD-10-CM | POA: Diagnosis not present

## 2019-02-19 MED ORDER — ONDANSETRON HCL 4 MG PO TABS: 8.0000 mg | ORAL_TABLET | Freq: Once | ORAL | Status: DC

## 2019-02-19 MED ORDER — AZACITIDINE CHEMO SQ INJECTION
75.0000 mg/m2 | Freq: Once | INTRAMUSCULAR | Status: AC
  Administered 2019-02-19: 14:00:00 132.5 mg via SUBCUTANEOUS
  Filled 2019-02-19: qty 5.3

## 2019-02-20 ENCOUNTER — Inpatient Hospital Stay: Payer: Medicare Other

## 2019-02-20 ENCOUNTER — Other Ambulatory Visit: Payer: Self-pay

## 2019-02-20 VITALS — BP 141/78 | HR 91 | Temp 98.8°F | Resp 18

## 2019-02-20 DIAGNOSIS — D46C Myelodysplastic syndrome with isolated del(5q) chromosomal abnormality: Secondary | ICD-10-CM

## 2019-02-20 DIAGNOSIS — D696 Thrombocytopenia, unspecified: Secondary | ICD-10-CM | POA: Diagnosis not present

## 2019-02-20 DIAGNOSIS — D701 Agranulocytosis secondary to cancer chemotherapy: Secondary | ICD-10-CM | POA: Diagnosis not present

## 2019-02-20 DIAGNOSIS — Z5111 Encounter for antineoplastic chemotherapy: Secondary | ICD-10-CM | POA: Diagnosis not present

## 2019-02-20 DIAGNOSIS — N179 Acute kidney failure, unspecified: Secondary | ICD-10-CM | POA: Diagnosis not present

## 2019-02-20 DIAGNOSIS — R197 Diarrhea, unspecified: Secondary | ICD-10-CM | POA: Diagnosis not present

## 2019-02-20 MED ORDER — AZACITIDINE CHEMO SQ INJECTION
75.0000 mg/m2 | Freq: Once | INTRAMUSCULAR | Status: AC
  Administered 2019-02-20: 15:00:00 132.5 mg via SUBCUTANEOUS
  Filled 2019-02-20: qty 5.3

## 2019-02-20 MED ORDER — ONDANSETRON HCL 4 MG PO TABS
8.0000 mg | ORAL_TABLET | Freq: Once | ORAL | Status: DC
  Filled 2019-02-20: qty 2

## 2019-02-21 ENCOUNTER — Other Ambulatory Visit: Payer: Self-pay

## 2019-02-21 ENCOUNTER — Inpatient Hospital Stay: Payer: Medicare Other

## 2019-02-21 VITALS — BP 159/65 | HR 97 | Temp 98.4°F | Resp 18

## 2019-02-21 DIAGNOSIS — D701 Agranulocytosis secondary to cancer chemotherapy: Secondary | ICD-10-CM | POA: Diagnosis not present

## 2019-02-21 DIAGNOSIS — N179 Acute kidney failure, unspecified: Secondary | ICD-10-CM | POA: Diagnosis not present

## 2019-02-21 DIAGNOSIS — D46C Myelodysplastic syndrome with isolated del(5q) chromosomal abnormality: Secondary | ICD-10-CM

## 2019-02-21 DIAGNOSIS — D696 Thrombocytopenia, unspecified: Secondary | ICD-10-CM | POA: Diagnosis not present

## 2019-02-21 DIAGNOSIS — R197 Diarrhea, unspecified: Secondary | ICD-10-CM | POA: Diagnosis not present

## 2019-02-21 DIAGNOSIS — Z5111 Encounter for antineoplastic chemotherapy: Secondary | ICD-10-CM | POA: Diagnosis not present

## 2019-02-21 MED ORDER — AZACITIDINE CHEMO SQ INJECTION
75.0000 mg/m2 | Freq: Once | INTRAMUSCULAR | Status: AC
  Administered 2019-02-21: 13:00:00 132.5 mg via SUBCUTANEOUS
  Filled 2019-02-21: qty 5.3

## 2019-02-21 MED ORDER — ONDANSETRON HCL 4 MG PO TABS: 8.0000 mg | ORAL_TABLET | Freq: Once | ORAL | Status: DC

## 2019-03-03 ENCOUNTER — Other Ambulatory Visit: Payer: Self-pay

## 2019-03-03 ENCOUNTER — Inpatient Hospital Stay: Payer: Medicare Other

## 2019-03-03 DIAGNOSIS — Z5111 Encounter for antineoplastic chemotherapy: Secondary | ICD-10-CM

## 2019-03-03 DIAGNOSIS — N179 Acute kidney failure, unspecified: Secondary | ICD-10-CM | POA: Diagnosis not present

## 2019-03-03 DIAGNOSIS — D696 Thrombocytopenia, unspecified: Secondary | ICD-10-CM

## 2019-03-03 DIAGNOSIS — R197 Diarrhea, unspecified: Secondary | ICD-10-CM | POA: Diagnosis not present

## 2019-03-03 DIAGNOSIS — D46C Myelodysplastic syndrome with isolated del(5q) chromosomal abnormality: Secondary | ICD-10-CM | POA: Diagnosis not present

## 2019-03-03 DIAGNOSIS — D701 Agranulocytosis secondary to cancer chemotherapy: Secondary | ICD-10-CM | POA: Diagnosis not present

## 2019-03-03 LAB — CBC WITH DIFFERENTIAL/PLATELET
Abs Immature Granulocytes: 0.03 10*3/uL (ref 0.00–0.07)
Basophils Absolute: 0 10*3/uL (ref 0.0–0.1)
Basophils Relative: 0 %
Eosinophils Absolute: 0.1 10*3/uL (ref 0.0–0.5)
Eosinophils Relative: 3 %
HCT: 28.9 % — ABNORMAL LOW (ref 36.0–46.0)
Hemoglobin: 8.9 g/dL — ABNORMAL LOW (ref 12.0–15.0)
Immature Granulocytes: 1 %
Lymphocytes Relative: 29 %
Lymphs Abs: 1.1 10*3/uL (ref 0.7–4.0)
MCH: 29.8 pg (ref 26.0–34.0)
MCHC: 30.8 g/dL (ref 30.0–36.0)
MCV: 96.7 fL (ref 80.0–100.0)
Monocytes Absolute: 0.2 10*3/uL (ref 0.1–1.0)
Monocytes Relative: 7 %
Neutro Abs: 2.2 10*3/uL (ref 1.7–7.7)
Neutrophils Relative %: 60 %
Platelets: 33 10*3/uL — ABNORMAL LOW (ref 150–400)
RBC: 2.99 MIL/uL — ABNORMAL LOW (ref 3.87–5.11)
RDW: 18.9 % — ABNORMAL HIGH (ref 11.5–15.5)
WBC: 3.6 10*3/uL — ABNORMAL LOW (ref 4.0–10.5)
nRBC: 0 % (ref 0.0–0.2)

## 2019-03-03 LAB — SAMPLE TO BLOOD BANK

## 2019-03-04 ENCOUNTER — Other Ambulatory Visit: Payer: Self-pay | Admitting: *Deleted

## 2019-03-04 DIAGNOSIS — D46C Myelodysplastic syndrome with isolated del(5q) chromosomal abnormality: Secondary | ICD-10-CM

## 2019-03-10 ENCOUNTER — Other Ambulatory Visit: Payer: Self-pay

## 2019-03-11 ENCOUNTER — Other Ambulatory Visit: Payer: Self-pay

## 2019-03-11 ENCOUNTER — Inpatient Hospital Stay: Payer: Medicare Other | Attending: Oncology

## 2019-03-11 DIAGNOSIS — E079 Disorder of thyroid, unspecified: Secondary | ICD-10-CM | POA: Diagnosis not present

## 2019-03-11 DIAGNOSIS — Z7984 Long term (current) use of oral hypoglycemic drugs: Secondary | ICD-10-CM | POA: Insufficient documentation

## 2019-03-11 DIAGNOSIS — Z5111 Encounter for antineoplastic chemotherapy: Secondary | ICD-10-CM | POA: Diagnosis not present

## 2019-03-11 DIAGNOSIS — Z79899 Other long term (current) drug therapy: Secondary | ICD-10-CM | POA: Insufficient documentation

## 2019-03-11 DIAGNOSIS — K580 Irritable bowel syndrome with diarrhea: Secondary | ICD-10-CM | POA: Insufficient documentation

## 2019-03-11 DIAGNOSIS — D46C Myelodysplastic syndrome with isolated del(5q) chromosomal abnormality: Secondary | ICD-10-CM | POA: Insufficient documentation

## 2019-03-11 DIAGNOSIS — D696 Thrombocytopenia, unspecified: Secondary | ICD-10-CM | POA: Diagnosis not present

## 2019-03-11 DIAGNOSIS — Z87891 Personal history of nicotine dependence: Secondary | ICD-10-CM | POA: Insufficient documentation

## 2019-03-11 DIAGNOSIS — E119 Type 2 diabetes mellitus without complications: Secondary | ICD-10-CM | POA: Insufficient documentation

## 2019-03-11 LAB — CBC WITH DIFFERENTIAL/PLATELET
Abs Immature Granulocytes: 0.02 10*3/uL (ref 0.00–0.07)
Basophils Absolute: 0 10*3/uL (ref 0.0–0.1)
Basophils Relative: 1 %
Eosinophils Absolute: 0 10*3/uL (ref 0.0–0.5)
Eosinophils Relative: 2 %
HCT: 27.9 % — ABNORMAL LOW (ref 36.0–46.0)
Hemoglobin: 8.6 g/dL — ABNORMAL LOW (ref 12.0–15.0)
Immature Granulocytes: 1 %
Lymphocytes Relative: 46 %
Lymphs Abs: 1 10*3/uL (ref 0.7–4.0)
MCH: 30 pg (ref 26.0–34.0)
MCHC: 30.8 g/dL (ref 30.0–36.0)
MCV: 97.2 fL (ref 80.0–100.0)
Monocytes Absolute: 0.1 10*3/uL (ref 0.1–1.0)
Monocytes Relative: 3 %
Neutro Abs: 1 10*3/uL — ABNORMAL LOW (ref 1.7–7.7)
Neutrophils Relative %: 47 %
Platelets: 56 10*3/uL — ABNORMAL LOW (ref 150–400)
RBC: 2.87 MIL/uL — ABNORMAL LOW (ref 3.87–5.11)
RDW: 19.3 % — ABNORMAL HIGH (ref 11.5–15.5)
WBC: 2.1 10*3/uL — ABNORMAL LOW (ref 4.0–10.5)
nRBC: 0 % (ref 0.0–0.2)

## 2019-03-11 LAB — SAMPLE TO BLOOD BANK

## 2019-03-14 NOTE — Progress Notes (Signed)
Patient is coming in for follow up visit, she is doing well no complaints

## 2019-03-18 ENCOUNTER — Other Ambulatory Visit: Payer: Self-pay

## 2019-03-18 ENCOUNTER — Inpatient Hospital Stay (HOSPITAL_BASED_OUTPATIENT_CLINIC_OR_DEPARTMENT_OTHER): Payer: Medicare Other | Admitting: Oncology

## 2019-03-18 ENCOUNTER — Encounter: Payer: Self-pay | Admitting: Oncology

## 2019-03-18 ENCOUNTER — Ambulatory Visit (INDEPENDENT_AMBULATORY_CARE_PROVIDER_SITE_OTHER): Payer: Medicare Other | Admitting: Gastroenterology

## 2019-03-18 ENCOUNTER — Inpatient Hospital Stay: Payer: Medicare Other

## 2019-03-18 VITALS — BP 151/72 | HR 80 | Temp 97.5°F | Ht 64.0 in | Wt 167.0 lb

## 2019-03-18 VITALS — BP 151/60 | HR 63 | Temp 97.9°F | Resp 21 | Wt 167.9 lb

## 2019-03-18 DIAGNOSIS — D696 Thrombocytopenia, unspecified: Secondary | ICD-10-CM | POA: Diagnosis not present

## 2019-03-18 DIAGNOSIS — Z5111 Encounter for antineoplastic chemotherapy: Secondary | ICD-10-CM

## 2019-03-18 DIAGNOSIS — D46C Myelodysplastic syndrome with isolated del(5q) chromosomal abnormality: Secondary | ICD-10-CM | POA: Diagnosis not present

## 2019-03-18 DIAGNOSIS — R197 Diarrhea, unspecified: Secondary | ICD-10-CM

## 2019-03-18 DIAGNOSIS — E079 Disorder of thyroid, unspecified: Secondary | ICD-10-CM | POA: Diagnosis not present

## 2019-03-18 DIAGNOSIS — E119 Type 2 diabetes mellitus without complications: Secondary | ICD-10-CM | POA: Diagnosis not present

## 2019-03-18 DIAGNOSIS — K529 Noninfective gastroenteritis and colitis, unspecified: Secondary | ICD-10-CM | POA: Diagnosis not present

## 2019-03-18 DIAGNOSIS — K580 Irritable bowel syndrome with diarrhea: Secondary | ICD-10-CM | POA: Diagnosis not present

## 2019-03-18 LAB — COMPREHENSIVE METABOLIC PANEL
ALT: 12 U/L (ref 0–44)
AST: 15 U/L (ref 15–41)
Albumin: 3.8 g/dL (ref 3.5–5.0)
Alkaline Phosphatase: 114 U/L (ref 38–126)
Anion gap: 11 (ref 5–15)
BUN: 27 mg/dL — ABNORMAL HIGH (ref 8–23)
CO2: 22 mmol/L (ref 22–32)
Calcium: 8.7 mg/dL — ABNORMAL LOW (ref 8.9–10.3)
Chloride: 105 mmol/L (ref 98–111)
Creatinine, Ser: 1.17 mg/dL — ABNORMAL HIGH (ref 0.44–1.00)
GFR calc Af Amer: 51 mL/min — ABNORMAL LOW (ref >60)
GFR calc non Af Amer: 44 mL/min — ABNORMAL LOW (ref >60)
Glucose, Bld: 230 mg/dL — ABNORMAL HIGH (ref 70–99)
Potassium: 4.4 mmol/L (ref 3.5–5.1)
Sodium: 138 mmol/L (ref 135–145)
Total Bilirubin: 0.5 mg/dL (ref 0.3–1.2)
Total Protein: 6.6 g/dL (ref 6.5–8.1)

## 2019-03-18 LAB — CBC WITH DIFFERENTIAL/PLATELET
Abs Immature Granulocytes: 0 10*3/uL (ref 0.00–0.07)
Basophils Absolute: 0 10*3/uL (ref 0.0–0.1)
Basophils Relative: 1 %
Eosinophils Absolute: 0.1 10*3/uL (ref 0.0–0.5)
Eosinophils Relative: 3 %
HCT: 29.1 % — ABNORMAL LOW (ref 36.0–46.0)
Hemoglobin: 8.8 g/dL — ABNORMAL LOW (ref 12.0–15.0)
Immature Granulocytes: 0 %
Lymphocytes Relative: 50 %
Lymphs Abs: 0.9 10*3/uL (ref 0.7–4.0)
MCH: 29.4 pg (ref 26.0–34.0)
MCHC: 30.2 g/dL (ref 30.0–36.0)
MCV: 97.3 fL (ref 80.0–100.0)
Monocytes Absolute: 0.1 10*3/uL (ref 0.1–1.0)
Monocytes Relative: 4 %
Neutro Abs: 0.7 10*3/uL — ABNORMAL LOW (ref 1.7–7.7)
Neutrophils Relative %: 42 %
Platelets: 74 10*3/uL — ABNORMAL LOW (ref 150–400)
RBC: 2.99 MIL/uL — ABNORMAL LOW (ref 3.87–5.11)
RDW: 19.6 % — ABNORMAL HIGH (ref 11.5–15.5)
WBC: 1.8 10*3/uL — ABNORMAL LOW (ref 4.0–10.5)
nRBC: 0 % (ref 0.0–0.2)

## 2019-03-18 NOTE — Progress Notes (Signed)
Jonathon Bellows MD, MRCP(U.K) [address]  [place]  [place], [place] [address]  Main: [phone]  Fax: [phone]   Gastroenterology Consultation  Referring Provider:     Earlie Server, MD Primary Care Physician:  Glean Hess, MD Primary Gastroenterologist:  Dr. Jonathon Bellows  Reason for Consultation:     Diverticular disease         HPI:   Yesenia Hart is a 78 y.o. y/o female referred for consultation & management  by Dr. Army Melia, Jesse Sans, MD.     Recently seen by Dr. Tasia Catchings in oncology for myelodysplastic syndrome.  As per her note presented to the ER in January 2019 with rectal bleeding and found to have a hemoglobin of 3 and platelet count of 59,000.  In 2019 February she underwent EGD colonoscopy with AVMs noted in the cecum, ascending colon, gastric body.  Cauterization was not performed due to thrombocytopenia.  This was all done before she moved to [place].  She also mentions she had been diagnosed with chronic colitis somewhere in [place].  She has been referred for establishment of care after she has moved to [place] in 2019 but not seen by anyone.  She has been on sulfasalazine for colitis in the past.  03/18/2019: Hemoglobin 8.8 g with a platelet count of 74 and white cell count of 1.8.  Creatinine 1.17.  LFTs normal.  She says has been diagnosed with colitis 3 or 4 years back.  Has been on sulfasalazine.  She says that when she stopped the sulfasalazine the diarrhea worsened.  Despite being on sulfasalazine she has about 2 watery bowel movements per day.  Denies any blood in her stool.  Denies any NSAID use.  Denies any joint pains.  Denies any eye symptoms.  Past Medical History:  Diagnosis Date  . Anemia   . B12 deficiency 06/10/2018  . Clotting disorder ([place])   . Diabetes mellitus without complication ([place])   . IBS (irritable bowel syndrome)   . Iron deficiency anemia due to chronic blood loss 12/12/2017  . MDS (myelodysplastic syndrome) ([place])    . MDS (myelodysplastic syndrome) ([place])   . Thyroid disease     Past Surgical History:  Procedure Laterality Date  . APPENDECTOMY    . breast biopsy 3'    . CHOLECYSTECTOMY    . CORONARY ANGIOPLASTY WITH STENT PLACEMENT  2016   in [place]    Prior to Admission medications   Medication Sig Start Date End Date Taking? Authorizing Provider  atorvastatin (LIPITOR) 20 MG tablet Take 1 tablet (20 mg total) by mouth daily. 07/19/18   Glean Hess, MD  azaCITIDine in lactated ringers infusion Inject 100 mg/m2 into the vein daily.    [provider]  furosemide (LASIX) 20 MG tablet TAKE 1 TABLET BY MOUTH ONCE DAILY 01/30/19   Glean Hess, MD  glimepiride (AMARYL) 4 MG tablet TAKE 1 TABLET BY MOUTH ONCE DAILY 11/27/18   Glean Hess, MD  GNP VITAMIN B-12 1000 MCG TBCR TAKE 1 TABLET BY MOUTH ONCE DAILY 01/15/19   Earlie Server, MD  Lancets [facility] ULTRASOFT) lancets Use as instructed 02/07/18   Glean Hess, MD  levothyroxine (SYNTHROID, LEVOTHROID) 50 MCG tablet TAKE 1 TABLET BY MOUTH ONCE DAILY ON AN EMPTY STOMACH. WAIT 30 MINUTES BEFORE TAKING OTHER MEDS. 10/23/18   Glean Hess, MD  loperamide (IMODIUM) 2 MG capsule Take 2 mg by mouth as needed for diarrhea or loose stools.  [provider]  NOVOLOG MIX 70/30 FLEXPEN (70-30) 100 UNIT/ML FlexPen Inject 0.08 mLs (8 Units total) into the skin 2 (two) times daily with a meal. 12/26/18   Glean Hess, MD  nystatin (MYCOSTATIN/NYSTOP) powder nystatin 100,000 unit/gram topical powder   1 app by topical route. 08/09/17   [provider]  ondansetron (ZOFRAN) 8 MG tablet TAKE 1 TABLET BY MOUTH TWICE DAILY AS NEEDED NAUSEA AND VOMITING 07/25/18   Earlie Server, MD  ONE [facility] ULTRA TEST test strip TEST TWICE DAILY 06/28/18   Glean Hess, MD  pantoprazole (PROTONIX) 40 MG tablet TAKE 1 TABLET BY MOUTH ONCE DAILY 01/15/19   Glean Hess, MD  quinapril (ACCUPRIL) 20 MG tablet TAKE 1 TABLET BY MOUTH ONCE  DAILY 12/04/18   Glean Hess, MD  sulfaSALAzine (AZULFIDINE) 500 MG tablet TAKE 1 TABLET BY MOUTH AT BEDTIME 01/30/19   Glean Hess, MD  ULTICARE MICRO PEN NEEDLES 32G X 4 MM MISC Inject 1 each into the skin 2 (two) times daily. 07/04/18   Glean Hess, MD    Family History  Adopted: Yes  Family history unknown: Yes     Social History   Tobacco Use  . Smoking status: Former Smoker    Packs/day: 1.00    Types: Cigarettes    Quit date: 1999    Years since quitting: 21.7  . Smokeless tobacco: Never Used  Substance Use Topics  . Alcohol use: Not Currently  . Drug use: Never    Allergies as of 03/18/2019 - Review Complete 03/18/2019  Allergen Reaction Noted  . Acarbose Diarrhea 11/28/2017  . Demerol [meperidine hcl]  11/28/2017  . Meperidine Nausea And Vomiting 11/28/2017  . Pioglitazone  11/28/2017    Review of Systems:    All systems reviewed and negative except where noted in HPI.   Physical Exam:  There were no vitals taken for this visit. No LMP recorded. Patient is postmenopausal. Psych:  Alert and cooperative. Normal mood and affect. General:   Alert,  Well-developed, well-nourished, pleasant and cooperative in NAD Head:  Normocephalic and atraumatic. Eyes:  Sclera clear, no icterus.   Conjunctiva pink. Ears:  Normal auditory acuity. Nose:  No deformity, discharge, or lesions. Mouth:  No deformity or lesions,oropharynx pink & moist. Neck:  Supple; no masses or thyromegaly. Lungs:  Respirations even and unlabored.  Clear throughout to auscultation.   No wheezes, crackles, or rhonchi. No acute distress. Heart:  Regular rate and rhythm; no murmurs, clicks, rubs, or gallops. Abdomen:  Normal bowel sounds.  No bruits.  Soft, non-tender and non-distended without masses, hepatosplenomegaly or hernias noted.  No guarding or rebound tenderness.    Neurologic:  Alert and oriented x3;  grossly normal neurologically. Skin:  Intact without significant lesions or  rashes. No jaundice. Lymph Nodes:  No significant cervical adenopathy. Psych:  Alert and cooperative. Normal mood and affect.  Imaging Studies: No results found.  Assessment and Plan:   Yesenia Hart is a 78 y.o. y/o female has been referred to establish care with gastroenterology.  She moved from [place] to [place] in 2019.  She carries a diagnosis as per her history of chronic colitis and has been on sulfasalazine in the past.  I do not have any prior records at this point of time.  She is seen by Dr. Tasia Catchings for myelodysplastic syndrome.  In 2019 she was also admitted in [place] with rectal bleeding and hemoglobin of 3 g and found to have  on endoscopy gastric and colonic AVMs which were not treated with cauterization due to thrombocytopenia.  Discussed that during her last colonoscopy she had had AVMs and she also recall that she had polyps.  Ideally we would need to repeat a colonoscopy, treat the AVMs and excised the polyps.  She is absolutely not interested.  She is aware of the risks of not doing so.  We agreed upon getting her records from her previous gastroenterologist.  Subsequently I will review them and discuss health maintenance issues with her at her next visit.  In the interim I will commence her on fiber pills and high-fiber diet to help with the diarrhea.  If the diarrhea were to worsen I could consider a flexible sigmoidoscopy which would be unsedated to determine the extent of her colitis.  Follow up in 6 to 8 weeks  Dr Jonathon Bellows MD,MRCP(U.K)

## 2019-03-18 NOTE — Progress Notes (Signed)
Pt denies any changes or concerns since pre assessment on 03/14/2019.

## 2019-03-18 NOTE — Progress Notes (Signed)
Hematology/Oncology Follow up note [facility] Telephone:(336) [phone] Fax:(336) [phone]   Patient Care Team: Glean Hess, MD as PCP - General (Internal Medicine) Yesenia Server, MD as Consulting Physician (Oncology)  REFERRING PROVIDER: Glean Hess, MD  Previous Oncologist Dr.Srujitha Murkutla REASON FOR VISIT Follow up for treatment of MDS  HISTORY OF PRESENTING ILLNESS:  Yesenia Hart is a  78 y.o.  female who recently moved from [place] to [place].  She used to live at [place] with husband.  Accompanied by daughter, [name] who is a respiratory therapist at [employer].  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  Patient presented to emergency room in January 2019 with complaints of intermittent rectal bleeding and increased to have a hemoglobin of 3 and platelet counts was 59,000.  Received 5 units of PRBC and was discharged home with hemoglobin of 10.  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  Cauterization was deferred due to thrombocytopenia. Aspirin was discontinued due to GI bleeding. Peripheral blood flow, SPEP negative, no obvious signs of B12 deficiency.  Ultrasound of the abdomen showed mild fatty liver.  On October 04, 2017, patient underwent bone marrow biopsy. Bone marrow core biopsy, bone marrow aspirate clot, bone marrow aspirate smears showed  single linage dysplasia.  Less than 5% bone marrow blasts and no circulating blast. 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  20 metaphases, lites were normal and 18 metaphases 5 q.  deletion, it has translocation involving chromosome of 3, 17, 16, and iso-di centric 22.  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  Patient received IV iron infusion on moving to [place].  She has not had any treatment for MDS done due to the move.  Today patient is accompanied by her daughter to the clinic.  She reports feeling tired, no weight loss.  She reports easy bruising.  Intermittent epistaxis which spontaneously resolved after applying pressure denies any hematochezia, hematemesis, hemoptysis.  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 Although patient does have 5q deletion which if isolated, is a good cytogentic group, she carried other cytogentic abnormalities which increases her IPSS-R score.   #12/26/2018 repeat bone marrow biopsy results were discussed with patient. Bone marrow biopsy showed normocellular, dysplastic erythroid and megakaryocyte lineages.  Note increase of blast.  Consistent with MDS. MDS FISH panel showed deletion 5 q. Negative for monosomy 5, deletion 7 q., monosomy 7, trisomy 8 deletion 20 q. Cytogenetics reveals a single cell with del(3)(p21) and del(5)(q13q33).  INTERVAL HISTORY [name] is a 78 y.o. female who has above history reviewed by me today presents for discussion of bone marrow biopsy results and assessment prior to MDS treatment with azacitidine.  Patient reports feeling well at baseline today. Continue to have intermittent diarrhea episodes. She has not been taking sulfasalazine recently. History of chronic colitis. Denies any fever, chills, abdominal pain .  Chronic fatigue at baseline. Bleeding events.   Review of Systems  Constitutional: Positive for fatigue. Negative for appetite change, chills and fever.  HENT:   Negative for hearing loss and voice change.   Eyes: Negative for  eye problems.  Respiratory: Negative for chest  tightness and cough.   Cardiovascular: Negative for chest pain.  Gastrointestinal: Positive for diarrhea. Negative for abdominal distention, abdominal pain and blood in stool.  Endocrine: Negative for hot flashes.  Genitourinary: Negative for difficulty urinating and frequency.   Musculoskeletal: Negative for arthralgias.  Skin: Negative for itching and rash.  Neurological: Negative for extremity weakness.  Hematological: Negative for adenopathy.  Psychiatric/Behavioral: Negative for confusion.    MEDICAL HISTORY:  Past Medical History:  Diagnosis Date  . Anemia   . B12 deficiency 06/10/2018  . Clotting disorder ([place])   . Diabetes mellitus without complication ([place])   . IBS (irritable bowel syndrome)   . Iron deficiency anemia due to chronic blood loss 12/12/2017  . MDS (myelodysplastic syndrome) ([place])   . MDS (myelodysplastic syndrome) ([place])   . Thyroid disease     SURGICAL HISTORY: Past Surgical History:  Procedure Laterality Date  . APPENDECTOMY    . breast biopsy 88'    . CHOLECYSTECTOMY    . CORONARY ANGIOPLASTY WITH STENT PLACEMENT  2016   in [place]: Social History   Socioeconomic History  . Marital status: Married    Spouse name: Not on file  . Number of children: Not on file  . Years of education: Not on file  . Highest education level: Not on file  Occupational History  . Occupation: retired  Scientific laboratory technician  . Financial resource strain: Not on file  . Food insecurity    Worry: Not on file    Inability: Not on file  . Transportation needs    Medical: Not on file    Non-medical: Not on file  Tobacco Use  . Smoking status: Former Smoker    Packs/day: 1.00    Types: Cigarettes    Quit date: 1999    Years since quitting: 21.7  . Smokeless tobacco: Never Used  Substance and Sexual Activity  . Alcohol use: Not Currently  . Drug use: Never  . Sexual activity: Yes  Lifestyle  . Physical activity    Days per week: Not on file     Minutes per session: Not on file  . Stress: Not on file  Relationships  . Social Herbalist on phone: Not on file    Gets together: Not on file    Attends religious service: Not on file    Active member of club or organization: Not on file    Attends meetings of clubs or organizations: Not on file    Relationship status: Not on file  . Intimate partner violence    Fear of current or ex partner: Not on file    Emotionally abused: Not on file    Physically abused: Not on file    Forced sexual activity: Not on file  Other Topics Concern  . Not on file  Social History Narrative  . Not on file    FAMILY HISTORY: Family History  Adopted: Yes  Family history unknown: Yes    ALLERGIES:  is allergic to acarbose; demerol [meperidine hcl]; meperidine; and pioglitazone.  MEDICATIONS:  Current Outpatient Medications  Medication Sig Dispense Refill  . atorvastatin (LIPITOR) 20 MG tablet Take 1 tablet (20 mg total) by mouth daily. 90 tablet 1  . azaCITIDine in lactated ringers infusion Inject 100 mg/m2 into the vein daily.    . furosemide (LASIX) 20 MG tablet TAKE 1 TABLET BY MOUTH ONCE DAILY 30 tablet 5  .  glimepiride (AMARYL) 4 MG tablet TAKE 1 TABLET BY MOUTH ONCE DAILY 90 tablet 1  . GNP VITAMIN B-12 1000 MCG TBCR TAKE 1 TABLET BY MOUTH ONCE DAILY 90 tablet 1  . Lancets (ONETOUCH ULTRASOFT) lancets Use as instructed 100 each 12  . levothyroxine (SYNTHROID, LEVOTHROID) 50 MCG tablet TAKE 1 TABLET BY MOUTH ONCE DAILY ON AN EMPTY STOMACH. WAIT 30 MINUTES BEFORE TAKING OTHER MEDS. 90 tablet 1  . loperamide (IMODIUM) 2 MG capsule Take 2 mg by mouth as needed for diarrhea or loose stools.    Marland Kitchen NOVOLOG MIX 70/30 FLEXPEN (70-30) 100 UNIT/ML FlexPen Inject 0.08 mLs (8 Units total) into the skin 2 (two) times daily with a meal. 21 mL 5  . nystatin (MYCOSTATIN/NYSTOP) powder nystatin 100,000 unit/gram topical powder   1 app by topical route.    . ondansetron (ZOFRAN) 8 MG tablet TAKE 1  TABLET BY MOUTH TWICE DAILY AS NEEDED NAUSEA AND VOMITING 30 tablet 1  . ONE TOUCH ULTRA TEST test strip TEST TWICE DAILY 200 each 1  . pantoprazole (PROTONIX) 40 MG tablet TAKE 1 TABLET BY MOUTH ONCE DAILY 90 tablet 1  . quinapril (ACCUPRIL) 20 MG tablet TAKE 1 TABLET BY MOUTH ONCE DAILY 90 tablet 1  . sulfaSALAzine (AZULFIDINE) 500 MG tablet TAKE 1 TABLET BY MOUTH AT BEDTIME 90 tablet 1  . ULTICARE MICRO PEN NEEDLES 32G X 4 MM MISC Inject 1 each into the skin 2 (two) times daily. 100 each 12   No current facility-administered medications for this visit.      PHYSICAL EXAMINATION: ECOG PERFORMANCE STATUS: 1 - Symptomatic but completely ambulatory Vitals:   03/18/19 1418  BP: (!) 151/60  Pulse: 63  Resp: (!) 21  Temp: 97.9 F (36.6 C)  SpO2: 100%   Filed Weights   03/18/19 1418  Weight: 167 lb 14.4 oz (76.2 kg)    Physical Exam Constitutional:      General: She is not in acute distress. HENT:     Head: Normocephalic and atraumatic.  Eyes:     General: No scleral icterus.    Conjunctiva/sclera: Conjunctivae normal.     Pupils: Pupils are equal, round, and reactive to light.  Neck:     Musculoskeletal: Normal range of motion and neck supple.  Cardiovascular:     Rate and Rhythm: Normal rate and regular rhythm.     Heart sounds: Normal heart sounds.  Pulmonary:     Effort: Pulmonary effort is normal. No respiratory distress.     Breath sounds: Normal breath sounds. No wheezing or rales.  Chest:     Chest wall: No tenderness.  Abdominal:     General: Bowel sounds are normal. There is no distension.     Palpations: Abdomen is soft. There is no mass.     Tenderness: There is no abdominal tenderness.  Musculoskeletal: Normal range of motion.        General: No deformity.  Lymphadenopathy:     Cervical: No cervical adenopathy.  Skin:    General: Skin is warm and dry.     Findings: No erythema or rash.  Neurological:     Mental Status: She is alert and oriented to  person, place, and time.     Cranial Nerves: No cranial nerve deficit.     Coordination: Coordination normal.  Psychiatric:        Behavior: Behavior normal.        Thought Content: Thought content normal.      LABORATORY  DATA:  I have reviewed the data as listed Lab Results  Component Value Date   WBC 1.8 (L) 03/18/2019   HGB 8.8 (L) 03/18/2019   HCT 29.1 (L) 03/18/2019   MCV 97.3 03/18/2019   PLT 74 (L) 03/18/2019   Recent Labs    02/10/19 1250 02/17/19 1336 03/18/19 1303  NA 140 137 138  K 4.3 4.4 4.4  CL 107 105 105  CO2 _0 GLUCOSE 161* 202* 230*  BUN 18 24* 27*  CREATININE 1.39* 1.08* 1.17*  CALCIUM 8.8* 8.7* 8.7*  GFRNONAA 36* 49* 44*  GFRAA 42* 57* 51*  PROT 6.5 6.7 6.6  ALBUMIN 3.6 3.6 3.8  AST _1 ALT _2 ALKPHOS 111 116 114  BILITOT 0.5 0.5 0.5       ASSESSMENT & PLAN:  1. MDS (myelodysplastic syndrome) with 5q deletion ([place])   2. Thrombocytopenia ([place])   3. Diarrhea, unspecified type   4. Encounter for antineoplastic chemotherapy     # MDS IPSS-R high risk, >3 cytogenetics abnormality/platelet counts less than 50,000 Revlimid not indicated due to complex cytogenetics including- 5q deletion  Patient has been doing well on azacitidine, she had a good response with improvement of thrombocytopenia  Developed fluctuating levels of thrombocytopenia and neutropenia since March 2020. Repeat bone marrow biopsy in June 2020 did not show any increased blast.  #Labs reviewed and discussed with patient. Thrombocytopenia has improved.  Platelet counts are 74,000. Chronic anemia, hemoglobin 8.8, at baseline. Continue to monitor Neutropenia with ANC of 0.7.  Possible due to uncontrolled colitis. Hold chemotherapy this week Repeat CBC CMP in 1 week, and if ANC >= 1, will proceed with treatments.  #Chronic diarrhea, reported history of being diagnosed with chronic colitis in the well.  Patient is on sulfasalazine.  Has not followed up with  gastroenterology since moved to [facility] in 2019. Patient has an appointment with gastroenterology Dr. Vicente Males this afternoon.  Follow-up in 5 weeks for evaluation prior to next chemotherapy treatment   Yesenia Server, MD, PhD 03/18/2019

## 2019-03-19 ENCOUNTER — Inpatient Hospital Stay: Payer: Medicare Other

## 2019-03-20 ENCOUNTER — Ambulatory Visit: Payer: Medicare Other

## 2019-03-21 ENCOUNTER — Ambulatory Visit: Payer: Medicare Other

## 2019-03-23 ENCOUNTER — Encounter: Payer: Self-pay | Admitting: Oncology

## 2019-03-24 ENCOUNTER — Inpatient Hospital Stay: Payer: Medicare Other

## 2019-03-24 ENCOUNTER — Other Ambulatory Visit: Payer: Self-pay

## 2019-03-24 ENCOUNTER — Ambulatory Visit: Payer: Medicare Other

## 2019-03-24 VITALS — BP 133/63 | HR 69 | Temp 97.8°F | Resp 18 | Wt 168.4 lb

## 2019-03-24 DIAGNOSIS — E119 Type 2 diabetes mellitus without complications: Secondary | ICD-10-CM | POA: Diagnosis not present

## 2019-03-24 DIAGNOSIS — Z5111 Encounter for antineoplastic chemotherapy: Secondary | ICD-10-CM | POA: Diagnosis not present

## 2019-03-24 DIAGNOSIS — D46C Myelodysplastic syndrome with isolated del(5q) chromosomal abnormality: Secondary | ICD-10-CM

## 2019-03-24 DIAGNOSIS — E079 Disorder of thyroid, unspecified: Secondary | ICD-10-CM | POA: Diagnosis not present

## 2019-03-24 DIAGNOSIS — K580 Irritable bowel syndrome with diarrhea: Secondary | ICD-10-CM | POA: Diagnosis not present

## 2019-03-24 DIAGNOSIS — D696 Thrombocytopenia, unspecified: Secondary | ICD-10-CM | POA: Diagnosis not present

## 2019-03-24 LAB — CBC WITH DIFFERENTIAL/PLATELET
Abs Immature Granulocytes: 0.01 10*3/uL (ref 0.00–0.07)
Basophils Absolute: 0 10*3/uL (ref 0.0–0.1)
Basophils Relative: 0 %
Eosinophils Absolute: 0.1 10*3/uL (ref 0.0–0.5)
Eosinophils Relative: 3 %
HCT: 28 % — ABNORMAL LOW (ref 36.0–46.0)
Hemoglobin: 8.5 g/dL — ABNORMAL LOW (ref 12.0–15.0)
Immature Granulocytes: 0 %
Lymphocytes Relative: 48 %
Lymphs Abs: 1.3 10*3/uL (ref 0.7–4.0)
MCH: 29.9 pg (ref 26.0–34.0)
MCHC: 30.4 g/dL (ref 30.0–36.0)
MCV: 98.6 fL (ref 80.0–100.0)
Monocytes Absolute: 0.4 10*3/uL (ref 0.1–1.0)
Monocytes Relative: 14 %
Neutro Abs: 1 10*3/uL — ABNORMAL LOW (ref 1.7–7.7)
Neutrophils Relative %: 35 %
Platelets: 58 10*3/uL — ABNORMAL LOW (ref 150–400)
RBC: 2.84 MIL/uL — ABNORMAL LOW (ref 3.87–5.11)
RDW: 19.7 % — ABNORMAL HIGH (ref 11.5–15.5)
WBC: 2.8 10*3/uL — ABNORMAL LOW (ref 4.0–10.5)
nRBC: 0.7 % — ABNORMAL HIGH (ref 0.0–0.2)

## 2019-03-24 LAB — COMPREHENSIVE METABOLIC PANEL
ALT: 13 U/L (ref 0–44)
AST: 15 U/L (ref 15–41)
Albumin: 3.4 g/dL — ABNORMAL LOW (ref 3.5–5.0)
Alkaline Phosphatase: 111 U/L (ref 38–126)
Anion gap: 7 (ref 5–15)
BUN: 20 mg/dL (ref 8–23)
CO2: 24 mmol/L (ref 22–32)
Calcium: 8.5 mg/dL — ABNORMAL LOW (ref 8.9–10.3)
Chloride: 107 mmol/L (ref 98–111)
Creatinine, Ser: 1.05 mg/dL — ABNORMAL HIGH (ref 0.44–1.00)
GFR calc Af Amer: 58 mL/min — ABNORMAL LOW (ref >60)
GFR calc non Af Amer: 50 mL/min — ABNORMAL LOW (ref >60)
Glucose, Bld: 155 mg/dL — ABNORMAL HIGH (ref 70–99)
Potassium: 4.3 mmol/L (ref 3.5–5.1)
Sodium: 138 mmol/L (ref 135–145)
Total Bilirubin: 0.6 mg/dL (ref 0.3–1.2)
Total Protein: 6.4 g/dL — ABNORMAL LOW (ref 6.5–8.1)

## 2019-03-24 MED ORDER — AZACITIDINE CHEMO SQ INJECTION
75.0000 mg/m2 | Freq: Once | INTRAMUSCULAR | Status: AC
  Administered 2019-03-24: 132.5 mg via SUBCUTANEOUS
  Filled 2019-03-24: qty 5.3

## 2019-03-24 MED ORDER — ONDANSETRON HCL 4 MG PO TABS: 8.0000 mg | ORAL_TABLET | Freq: Once | ORAL | Status: DC

## 2019-03-24 NOTE — Telephone Encounter (Signed)
Called patient to discuss any issues she may be having.  Patient reports that she feels her anxiety is well controlled.  She was aware of her appointments this week.

## 2019-03-25 ENCOUNTER — Other Ambulatory Visit: Payer: Self-pay

## 2019-03-25 ENCOUNTER — Inpatient Hospital Stay: Payer: Medicare Other

## 2019-03-25 VITALS — BP 115/51 | HR 70 | Temp 97.9°F | Resp 19

## 2019-03-25 DIAGNOSIS — E079 Disorder of thyroid, unspecified: Secondary | ICD-10-CM | POA: Diagnosis not present

## 2019-03-25 DIAGNOSIS — D46C Myelodysplastic syndrome with isolated del(5q) chromosomal abnormality: Secondary | ICD-10-CM | POA: Diagnosis not present

## 2019-03-25 DIAGNOSIS — K580 Irritable bowel syndrome with diarrhea: Secondary | ICD-10-CM | POA: Diagnosis not present

## 2019-03-25 DIAGNOSIS — D696 Thrombocytopenia, unspecified: Secondary | ICD-10-CM | POA: Diagnosis not present

## 2019-03-25 DIAGNOSIS — Z5111 Encounter for antineoplastic chemotherapy: Secondary | ICD-10-CM | POA: Diagnosis not present

## 2019-03-25 DIAGNOSIS — E119 Type 2 diabetes mellitus without complications: Secondary | ICD-10-CM | POA: Diagnosis not present

## 2019-03-25 MED ORDER — AZACITIDINE CHEMO SQ INJECTION
75.0000 mg/m2 | Freq: Once | INTRAMUSCULAR | Status: AC
  Administered 2019-03-25: 12:00:00 132.5 mg via SUBCUTANEOUS
  Filled 2019-03-25: qty 5.3

## 2019-03-26 ENCOUNTER — Inpatient Hospital Stay: Payer: Medicare Other

## 2019-03-26 ENCOUNTER — Other Ambulatory Visit: Payer: Self-pay

## 2019-03-26 VITALS — BP 109/66 | HR 86 | Temp 98.8°F | Resp 19

## 2019-03-26 DIAGNOSIS — E079 Disorder of thyroid, unspecified: Secondary | ICD-10-CM | POA: Diagnosis not present

## 2019-03-26 DIAGNOSIS — K580 Irritable bowel syndrome with diarrhea: Secondary | ICD-10-CM | POA: Diagnosis not present

## 2019-03-26 DIAGNOSIS — D696 Thrombocytopenia, unspecified: Secondary | ICD-10-CM | POA: Diagnosis not present

## 2019-03-26 DIAGNOSIS — Z5111 Encounter for antineoplastic chemotherapy: Secondary | ICD-10-CM | POA: Diagnosis not present

## 2019-03-26 DIAGNOSIS — D46C Myelodysplastic syndrome with isolated del(5q) chromosomal abnormality: Secondary | ICD-10-CM | POA: Diagnosis not present

## 2019-03-26 DIAGNOSIS — E119 Type 2 diabetes mellitus without complications: Secondary | ICD-10-CM | POA: Diagnosis not present

## 2019-03-26 MED ORDER — AZACITIDINE CHEMO SQ INJECTION
75.0000 mg/m2 | Freq: Once | INTRAMUSCULAR | Status: AC
  Administered 2019-03-26: 132.5 mg via SUBCUTANEOUS
  Filled 2019-03-26: qty 5.3

## 2019-03-27 ENCOUNTER — Other Ambulatory Visit: Payer: Self-pay

## 2019-03-27 ENCOUNTER — Inpatient Hospital Stay: Payer: Medicare Other

## 2019-03-27 VITALS — BP 115/68 | HR 76 | Temp 98.0°F | Resp 20

## 2019-03-27 DIAGNOSIS — E079 Disorder of thyroid, unspecified: Secondary | ICD-10-CM | POA: Diagnosis not present

## 2019-03-27 DIAGNOSIS — Z5111 Encounter for antineoplastic chemotherapy: Secondary | ICD-10-CM | POA: Diagnosis not present

## 2019-03-27 DIAGNOSIS — D696 Thrombocytopenia, unspecified: Secondary | ICD-10-CM | POA: Diagnosis not present

## 2019-03-27 DIAGNOSIS — D46C Myelodysplastic syndrome with isolated del(5q) chromosomal abnormality: Secondary | ICD-10-CM

## 2019-03-27 DIAGNOSIS — K580 Irritable bowel syndrome with diarrhea: Secondary | ICD-10-CM | POA: Diagnosis not present

## 2019-03-27 DIAGNOSIS — E119 Type 2 diabetes mellitus without complications: Secondary | ICD-10-CM | POA: Diagnosis not present

## 2019-03-27 MED ORDER — ONDANSETRON HCL 4 MG PO TABS: 8.0000 mg | ORAL_TABLET | Freq: Once | ORAL | Status: DC

## 2019-03-27 MED ORDER — AZACITIDINE CHEMO SQ INJECTION
75.0000 mg/m2 | Freq: Once | INTRAMUSCULAR | Status: AC
  Administered 2019-03-27: 132.5 mg via SUBCUTANEOUS
  Filled 2019-03-27: qty 5.3

## 2019-03-28 ENCOUNTER — Other Ambulatory Visit: Payer: Self-pay

## 2019-03-28 ENCOUNTER — Other Ambulatory Visit: Payer: Self-pay | Admitting: Oncology

## 2019-03-28 ENCOUNTER — Inpatient Hospital Stay: Payer: Medicare Other

## 2019-03-28 VITALS — BP 124/67 | HR 87 | Temp 98.2°F | Resp 20

## 2019-03-28 DIAGNOSIS — D696 Thrombocytopenia, unspecified: Secondary | ICD-10-CM | POA: Diagnosis not present

## 2019-03-28 DIAGNOSIS — D46C Myelodysplastic syndrome with isolated del(5q) chromosomal abnormality: Secondary | ICD-10-CM | POA: Diagnosis not present

## 2019-03-28 DIAGNOSIS — Z5111 Encounter for antineoplastic chemotherapy: Secondary | ICD-10-CM | POA: Diagnosis not present

## 2019-03-28 DIAGNOSIS — E079 Disorder of thyroid, unspecified: Secondary | ICD-10-CM | POA: Diagnosis not present

## 2019-03-28 DIAGNOSIS — K580 Irritable bowel syndrome with diarrhea: Secondary | ICD-10-CM | POA: Diagnosis not present

## 2019-03-28 DIAGNOSIS — E119 Type 2 diabetes mellitus without complications: Secondary | ICD-10-CM | POA: Diagnosis not present

## 2019-03-28 MED ORDER — ONDANSETRON HCL 4 MG PO TABS: 8.0000 mg | ORAL_TABLET | Freq: Once | ORAL | Status: DC

## 2019-03-28 MED ORDER — AZACITIDINE CHEMO SQ INJECTION
75.0000 mg/m2 | Freq: Once | INTRAMUSCULAR | Status: AC
  Administered 2019-03-28: 132.5 mg via SUBCUTANEOUS
  Filled 2019-03-28: qty 5.3

## 2019-04-03 ENCOUNTER — Other Ambulatory Visit: Payer: Self-pay | Admitting: Internal Medicine

## 2019-04-03 DIAGNOSIS — E1169 Type 2 diabetes mellitus with other specified complication: Secondary | ICD-10-CM

## 2019-04-04 ENCOUNTER — Ambulatory Visit: Payer: Medicare Other | Admitting: Internal Medicine

## 2019-04-18 ENCOUNTER — Ambulatory Visit (INDEPENDENT_AMBULATORY_CARE_PROVIDER_SITE_OTHER): Payer: Medicare Other | Admitting: Internal Medicine

## 2019-04-18 ENCOUNTER — Encounter: Payer: Self-pay | Admitting: Internal Medicine

## 2019-04-18 ENCOUNTER — Other Ambulatory Visit: Payer: Self-pay

## 2019-04-18 ENCOUNTER — Encounter: Payer: Self-pay | Admitting: Oncology

## 2019-04-18 VITALS — BP 104/68 | HR 79 | Ht 64.0 in | Wt 168.0 lb

## 2019-04-18 DIAGNOSIS — Z23 Encounter for immunization: Secondary | ICD-10-CM | POA: Diagnosis not present

## 2019-04-18 DIAGNOSIS — I10 Essential (primary) hypertension: Secondary | ICD-10-CM | POA: Diagnosis not present

## 2019-04-18 DIAGNOSIS — E118 Type 2 diabetes mellitus with unspecified complications: Secondary | ICD-10-CM | POA: Diagnosis not present

## 2019-04-18 NOTE — Progress Notes (Signed)
Date:  04/18/2019   Name:  Yesenia Hart   DOB:  [birth year]   MRN:  [id number]   Chief Complaint: Diabetes (High dose flu shot.) and Hypertension  Diabetes She presents for her follow-up diabetic visit. She has type 2 diabetes mellitus. Her disease course has been stable. Pertinent negatives for hypoglycemia include no headaches or tremors. Pertinent negatives for diabetes include no blurred vision, no chest pain, no fatigue, no foot ulcerations and no weight loss. Symptoms are stable. She monitors blood glucose at home 1-2 x per day. There is no change in her home blood glucose trend. Her bedtime blood glucose is taken between 9-10 pm. Her bedtime blood glucose range is generally 110-130 mg/dl. An ACE inhibitor/angiotensin II receptor blocker is being taken.  Hypertension This is a chronic problem. The problem is controlled. Pertinent negatives include no blurred vision, chest pain, headaches, palpitations, peripheral edema, PND or shortness of breath.   Lab Results  Component Value Date   HGBA1C 6.0 (H) 11/25/2018   Lab Results  Component Value Date   CREATININE 1.05 (H) 03/24/2019   BUN 20 03/24/2019   NA 138 03/24/2019   K 4.3 03/24/2019   CL 107 03/24/2019   CO2 24 03/24/2019   Lab Results  Component Value Date   CHOL 101 11/25/2018   HDL 53 11/25/2018   LDLCALC 36 11/25/2018   TRIG 59 11/25/2018   CHOLHDL 1.9 11/25/2018     Review of Systems  Constitutional: Negative for appetite change, fatigue, fever, unexpected weight change and weight loss.  HENT: Negative for tinnitus and trouble swallowing.   Eyes: Negative for blurred vision and visual disturbance.  Respiratory: Negative for cough, chest tightness and shortness of breath.   Cardiovascular: Negative for chest pain, palpitations, leg swelling and PND.  Gastrointestinal: Negative for abdominal pain.  Genitourinary: Negative for dysuria and hematuria.  Musculoskeletal: Negative for arthralgias.   Neurological: Negative for tremors, numbness and headaches.  Psychiatric/Behavioral: Negative for dysphoric mood.    Patient Active Problem List   Diagnosis Date Noted  . Neutropenia ([place]) 12/24/2018  . Anemia 12/24/2018  . MDS (myelodysplastic syndrome), high grade ([place]) 11/18/2018  . Gastroesophageal reflux disease 07/19/2018  . B12 deficiency 06/10/2018  . 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 CAD (coronary artery disease), native coronary artery 03/15/2018  . Drug-induced neutropenia ([place]) 01/14/2018  . Type 2 diabetes mellitus without complication, with long-term current use of insulin ([place]) 12/17/2017  . Fever blister 12/17/2017  . Tobacco use disorder, moderate, in sustained remission 12/17/2017  . Thrombocytopenia ([place]) 12/12/2017  . Goals of care, counseling/discussion 12/12/2017  . Other fatigue 12/12/2017  . Asthma 11/30/2017  . Cataract 11/30/2017  . 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 Edema 11/30/2017  . Essential hypertension 11/30/2017  . Hyperlipidemia associated with type 2 diabetes mellitus ([place]) 11/30/2017  . Osteoarthritis 11/30/2017  . 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 Acarbose Diarrhea  . Demerol [Meperidine Hcl]   . Meperidine Nausea And Vomiting  . Pioglitazone     Other reaction(s): Edema    Past Surgical History:  Procedure Laterality Date  . APPENDECTOMY    . breast biopsy 71'    . CHOLECYSTECTOMY    . CORONARY ANGIOPLASTY WITH STENT PLACEMENT  2016   in [place] History   Tobacco Use  . Smoking status: Former Smoker  Packs/day: 1.00    Types: Cigarettes    Quit date: 1999    Years since quitting: 21.7  . Smokeless tobacco: Never Used  Substance Use Topics  . Alcohol use: Not Currently  . Drug use: Never     Medication list has been reviewed and updated.  Current Meds   Medication Sig  . atorvastatin (LIPITOR) 20 MG tablet TAKE 1 TABLET BY MOUTH ONCE DAILY  . azaCITIDine in lactated ringers infusion Inject 100 mg/m2 into the vein daily.  . furosemide (LASIX) 20 MG tablet TAKE 1 TABLET BY MOUTH ONCE DAILY  . glimepiride (AMARYL) 4 MG tablet TAKE 1 TABLET BY MOUTH ONCE DAILY  . GNP VITAMIN B-12 1000 MCG TBCR TAKE 1 TABLET BY MOUTH ONCE DAILY  . Lancets (ONETOUCH ULTRASOFT) lancets Use as instructed  . levothyroxine (SYNTHROID, LEVOTHROID) 50 MCG tablet TAKE 1 TABLET BY MOUTH ONCE DAILY ON AN EMPTY STOMACH. WAIT 30 MINUTES BEFORE TAKING OTHER MEDS.  Marland Kitchen loperamide (IMODIUM) 2 MG capsule Take 2 mg by mouth as needed for diarrhea or loose stools.  Marland Kitchen NOVOLOG MIX 70/30 FLEXPEN (70-30) 100 UNIT/ML FlexPen Inject 0.08 mLs (8 Units total) into the skin 2 (two) times daily with a meal.  . nystatin (MYCOSTATIN/NYSTOP) powder nystatin 100,000 unit/gram topical powder   1 app by topical route.  . ondansetron (ZOFRAN) 8 MG tablet TAKE 1 TABLET BY MOUTH TWICE DAILY AS NEEDED NAUSEA AND VOMITING  . ONE TOUCH ULTRA TEST test strip TEST TWICE DAILY  . pantoprazole (PROTONIX) 40 MG tablet TAKE 1 TABLET BY MOUTH ONCE DAILY  . quinapril (ACCUPRIL) 20 MG tablet TAKE 1 TABLET BY MOUTH ONCE DAILY  . sulfaSALAzine (AZULFIDINE) 500 MG tablet TAKE 1 TABLET BY MOUTH AT BEDTIME  . ULTICARE MICRO PEN NEEDLES 32G X 4 MM MISC Inject 1 each into the skin 2 (two) times daily.    PHQ 2/9 Scores 04/18/2019 11/22/2018 07/19/2018 12/17/2017  PHQ - 2 Score 0 2 0 0  PHQ- 9 Score - 2 - -    BP Readings from Last 3 Encounters:  04/18/19 104/68  03/28/19 124/67  03/27/19 115/68    Physical Exam Vitals signs and nursing note reviewed.  Constitutional:      General: She is not in acute distress.    Appearance: Normal appearance. She is well-developed.  HENT:     Head: Normocephalic and atraumatic.  Neck:     Musculoskeletal: Normal range of motion.     Vascular: No carotid bruit.   Cardiovascular:     Rate and Rhythm: Normal rate and regular rhythm.  Pulmonary:     Effort: Pulmonary effort is normal. No respiratory distress.     Breath sounds: Normal breath sounds.  Musculoskeletal:     Right lower leg: No edema.     Left lower leg: No edema.  Lymphadenopathy:     Cervical: No cervical adenopathy.  Skin:    General: Skin is warm and dry.     Capillary Refill: Capillary refill takes less than 2 seconds.     Findings: No rash.  Neurological:     General: No focal deficit present.     Mental Status: She is alert and oriented to person, place, and time.  Psychiatric:        Behavior: Behavior normal.        Thought Content: Thought content normal.     Wt Readings from Last 3 Encounters:  04/18/19 168 lb (76.2 kg)  03/24/19 168 lb 6.4 oz (  76.4 kg)  03/18/19 167 lb (75.8 kg)    BP 104/68   Pulse 79   Ht 5' 4"  (1.626 m)   Wt 168 lb (76.2 kg)   SpO2 96%   BMI 28.84 kg/m   Assessment and Plan: 1. Type II diabetes mellitus with complication (HCC) Clinically stable by exam and report without s/s of hypoglycemia. DM complicated by HTN, lipids. Tolerating medications Novolog mix 70/30 8 unites bid, amaryl 4 mg daily well without side effects or other concerns. - Hemoglobin A1c  2. Essential hypertension Clinically stable exam with well controlled BP.   Tolerating medications, lasix 20 mg and quinapril 20 mg dialy, without side effects at this time. Pt to continue current regimen and low sodium diet; benefits of regular exercise as able discussed.  3. Need for immunization against influenza - Flu Vaccine QUAD High Dose(Fluad)   Partially dictated using Editor, commissioning. Any errors are unintentional.  Halina Maidens, MD [facility]  04/18/2019

## 2019-04-18 NOTE — Patient Instructions (Signed)
Schedule your diabetic eye exam with Dr. Jomarie Longs in [place]

## 2019-04-18 NOTE — Progress Notes (Signed)
Called patient to prechart for office visit.  Patient denies any nausea, vomiting, diarrhea, constipation or pain.

## 2019-04-19 ENCOUNTER — Other Ambulatory Visit: Payer: Self-pay | Admitting: Internal Medicine

## 2019-04-21 ENCOUNTER — Other Ambulatory Visit: Payer: Self-pay

## 2019-04-21 ENCOUNTER — Telehealth: Payer: Self-pay

## 2019-04-21 ENCOUNTER — Inpatient Hospital Stay: Payer: Medicare Other

## 2019-04-21 ENCOUNTER — Inpatient Hospital Stay (HOSPITAL_BASED_OUTPATIENT_CLINIC_OR_DEPARTMENT_OTHER): Payer: Medicare Other | Admitting: Oncology

## 2019-04-21 ENCOUNTER — Inpatient Hospital Stay: Payer: Medicare Other | Attending: Oncology

## 2019-04-21 VITALS — BP 130/66 | HR 101 | Temp 97.3°F | Resp 18 | Wt 166.7 lb

## 2019-04-21 DIAGNOSIS — T451X5A Adverse effect of antineoplastic and immunosuppressive drugs, initial encounter: Secondary | ICD-10-CM | POA: Diagnosis not present

## 2019-04-21 DIAGNOSIS — E119 Type 2 diabetes mellitus without complications: Secondary | ICD-10-CM

## 2019-04-21 DIAGNOSIS — D701 Agranulocytosis secondary to cancer chemotherapy: Secondary | ICD-10-CM

## 2019-04-21 DIAGNOSIS — R197 Diarrhea, unspecified: Secondary | ICD-10-CM

## 2019-04-21 DIAGNOSIS — D696 Thrombocytopenia, unspecified: Secondary | ICD-10-CM

## 2019-04-21 DIAGNOSIS — D619 Aplastic anemia, unspecified: Secondary | ICD-10-CM | POA: Insufficient documentation

## 2019-04-21 DIAGNOSIS — Z794 Long term (current) use of insulin: Secondary | ICD-10-CM

## 2019-04-21 DIAGNOSIS — Z87891 Personal history of nicotine dependence: Secondary | ICD-10-CM | POA: Diagnosis not present

## 2019-04-21 DIAGNOSIS — K529 Noninfective gastroenteritis and colitis, unspecified: Secondary | ICD-10-CM | POA: Diagnosis not present

## 2019-04-21 DIAGNOSIS — E538 Deficiency of other specified B group vitamins: Secondary | ICD-10-CM | POA: Insufficient documentation

## 2019-04-21 DIAGNOSIS — D46C Myelodysplastic syndrome with isolated del(5q) chromosomal abnormality: Secondary | ICD-10-CM

## 2019-04-21 DIAGNOSIS — E079 Disorder of thyroid, unspecified: Secondary | ICD-10-CM | POA: Diagnosis not present

## 2019-04-21 DIAGNOSIS — Z5111 Encounter for antineoplastic chemotherapy: Secondary | ICD-10-CM

## 2019-04-21 DIAGNOSIS — D6189 Other specified aplastic anemias and other bone marrow failure syndromes: Secondary | ICD-10-CM | POA: Diagnosis not present

## 2019-04-21 DIAGNOSIS — K580 Irritable bowel syndrome with diarrhea: Secondary | ICD-10-CM | POA: Insufficient documentation

## 2019-04-21 DIAGNOSIS — Z79899 Other long term (current) drug therapy: Secondary | ICD-10-CM | POA: Insufficient documentation

## 2019-04-21 LAB — CBC WITH DIFFERENTIAL/PLATELET
Abs Immature Granulocytes: 0.01 10*3/uL (ref 0.00–0.07)
Basophils Absolute: 0 10*3/uL (ref 0.0–0.1)
Basophils Relative: 1 %
Eosinophils Absolute: 0 10*3/uL (ref 0.0–0.5)
Eosinophils Relative: 4 %
HCT: 26.1 % — ABNORMAL LOW (ref 36.0–46.0)
Hemoglobin: 7.8 g/dL — ABNORMAL LOW (ref 12.0–15.0)
Immature Granulocytes: 1 %
Lymphocytes Relative: 50 %
Lymphs Abs: 0.4 10*3/uL — ABNORMAL LOW (ref 0.7–4.0)
MCH: 28.8 pg (ref 26.0–34.0)
MCHC: 29.9 g/dL — ABNORMAL LOW (ref 30.0–36.0)
MCV: 96.3 fL (ref 80.0–100.0)
Monocytes Absolute: 0.1 10*3/uL (ref 0.1–1.0)
Monocytes Relative: 7 %
Neutro Abs: 0.3 10*3/uL — ABNORMAL LOW (ref 1.7–7.7)
Neutrophils Relative %: 37 %
Platelets: 75 10*3/uL — ABNORMAL LOW (ref 150–400)
RBC: 2.71 MIL/uL — ABNORMAL LOW (ref 3.87–5.11)
RDW: 21.1 % — ABNORMAL HIGH (ref 11.5–15.5)
Smear Review: NORMAL
WBC: 0.9 10*3/uL — CL (ref 4.0–10.5)
nRBC: 3.5 % — ABNORMAL HIGH (ref 0.0–0.2)

## 2019-04-21 LAB — COMPREHENSIVE METABOLIC PANEL
ALT: 12 U/L (ref 0–44)
AST: 16 U/L (ref 15–41)
Albumin: 3.6 g/dL (ref 3.5–5.0)
Alkaline Phosphatase: 102 U/L (ref 38–126)
Anion gap: 8 (ref 5–15)
BUN: 22 mg/dL (ref 8–23)
CO2: 22 mmol/L (ref 22–32)
Calcium: 8.6 mg/dL — ABNORMAL LOW (ref 8.9–10.3)
Chloride: 108 mmol/L (ref 98–111)
Creatinine, Ser: 1.35 mg/dL — ABNORMAL HIGH (ref 0.44–1.00)
GFR calc Af Amer: 43 mL/min — ABNORMAL LOW (ref >60)
GFR calc non Af Amer: 37 mL/min — ABNORMAL LOW (ref >60)
Glucose, Bld: 265 mg/dL — ABNORMAL HIGH (ref 70–99)
Potassium: 4.3 mmol/L (ref 3.5–5.1)
Sodium: 138 mmol/L (ref 135–145)
Total Bilirubin: 0.6 mg/dL (ref 0.3–1.2)
Total Protein: 6.5 g/dL (ref 6.5–8.1)

## 2019-04-21 LAB — HEMOGLOBIN A1C
Hgb A1c MFr Bld: 6.4 % — ABNORMAL HIGH (ref 4.8–5.6)
Mean Plasma Glucose: 136.98 mg/dL

## 2019-04-21 NOTE — Progress Notes (Signed)
Hematology/Oncology Follow up note [facility] [phone] Fax:([phone]   Patient Care Team: Glean Hess, MD as PCP - General (Internal Medicine) Earlie Server, MD as Consulting Physician (Oncology)  REFERRING PROVIDER: Glean Hess, MD  Previous Oncologist Dr.Srujitha Murkutla REASON FOR VISIT Follow up for treatment of MDS  HISTORY OF PRESENTING ILLNESS:  Yesenia Hart is a  78 y.o.  female who recently moved from [place] to [place].  She used to live at [place] with husband.  Accompanied by daughter, [name] who is a respiratory therapist at [employer].  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  Patient presented to emergency room in January 2019 with complaints of intermittent rectal bleeding and increased to have a hemoglobin of 3 and platelet counts was 59,000.  Received 5 units of PRBC and was discharged home with hemoglobin of 10.  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  Cauterization was deferred due to thrombocytopenia. Aspirin was discontinued due to GI bleeding. Peripheral blood flow, SPEP negative, no obvious signs of B12 deficiency.  Ultrasound of the abdomen showed mild fatty liver.  On October 04, 2017, patient underwent bone marrow biopsy. Bone marrow core biopsy, bone marrow aspirate clot, bone marrow aspirate smears showed  single linage dysplasia.  Less than 5% bone marrow blasts and no circulating blast. Iron storage is nearly absent Peripheral blood smear showed moderate and anisopoikilocytosis Marrow aspirate normal in  adenoid maturation Cytogenetics performed at the [facility] in [place] showed 46,xx, t(3;17;16)(p21;q21;q24), add (5) (q11,2), idic(22)(p11,2)[18]/46,xx[2].  20 metaphases, lites were normal and 18 metaphases 5 q.  deletion, it has translocation involving chromosome of 3, 17, 16, and iso-di centric 22.  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  Patient received IV iron infusion on moving to [place].  She has not had any treatment for MDS done due to the move.  Today patient is accompanied by her daughter to the clinic.  She reports feeling tired, no weight loss.  She reports easy bruising.  Intermittent epistaxis which spontaneously resolved after applying pressure denies any hematochezia, hematemesis, hemoptysis.  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 Although patient does have 5q deletion which if isolated, is a good cytogentic group, she carried other cytogentic abnormalities which increases her IPSS-R score.   #12/26/2018 repeat bone marrow biopsy results were discussed with patient. Bone marrow biopsy showed normocellular, dysplastic erythroid and megakaryocyte lineages.  Note increase of blast.  Consistent with MDS. MDS FISH panel showed deletion 5 q. Negative for monosomy 5, deletion 7 q., monosomy 7, trisomy 8 deletion 20 q. Cytogenetics reveals a single cell with del(3)(p21) and del(5)(q13q33).  INTERVAL HISTORY Yesenia Hart is a 78 y.o. female who has above history reviewed by me today presents for discussion of bone marrow biopsy results and assessment prior to MDS treatment with azacitidine.  Patient reports feeling well at baseline today Denies any fever, chills, mouth sore, nausea, vomiting, chest pain, shortness of breath, abdominal pain. Patient has chronic diarrhea, history of colitis.  Which recently has improved. She also has establish care with gastroenterology Dr. Vicente Males who is going to obtain her previous GI medical records.  Patient has a follow-up appointment with Dr. Vicente Males  .  Review of Systems  Constitutional: Positive  for fatigue. Negative for appetite change, chills and  fever.  HENT:   Negative for hearing loss and voice change.   Eyes: Negative for eye problems.  Respiratory: Negative for chest tightness and cough.   Cardiovascular: Negative for chest pain.  Gastrointestinal: Positive for diarrhea. Negative for abdominal distention, abdominal pain and blood in stool.  Endocrine: Negative for hot flashes.  Genitourinary: Negative for difficulty urinating and frequency.   Musculoskeletal: Negative for arthralgias.  Skin: Negative for itching and rash.  Neurological: Negative for extremity weakness.  Hematological: Negative for adenopathy.  Psychiatric/Behavioral: Negative for confusion.    MEDICAL HISTORY:  Past Medical History:  Diagnosis Date  . Anemia   . B12 deficiency 06/10/2018  . Clotting disorder ([place])   . Diabetes mellitus without complication ([place])   . IBS (irritable bowel syndrome)   . Iron deficiency anemia due to chronic blood loss 12/12/2017  . MDS (myelodysplastic syndrome) ([place])   . MDS (myelodysplastic syndrome) ([place])   . Thyroid disease     SURGICAL HISTORY: Past Surgical History:  Procedure Laterality Date  . APPENDECTOMY    . breast biopsy 53'    . CHOLECYSTECTOMY    . CORONARY ANGIOPLASTY WITH STENT PLACEMENT  2016   in [place]: Social History   Socioeconomic History  . Marital status: Married    Spouse name: Not on file  . Number of children: Not on file  . Years of education: Not on file  . Highest education level: Not on file  Occupational History  . Occupation: retired  Scientific laboratory technician  . Financial resource strain: Not on file  . Food insecurity    Worry: Not on file    Inability: Not on file  . Transportation needs    Medical: Not on file    Non-medical: Not on file  Tobacco Use  . Smoking status: Former Smoker    Packs/day: 1.00    Types: Cigarettes    Quit date: 1999    Years since quitting: 21.7  . Smokeless tobacco: Never Used  Substance and Sexual Activity  . Alcohol use:  Not Currently  . Drug use: Never  . Sexual activity: Yes  Lifestyle  . Physical activity    Days per week: Not on file    Minutes per session: Not on file  . Stress: Not on file  Relationships  . Social Herbalist on phone: Not on file    Gets together: Not on file    Attends religious service: Not on file    Active member of club or organization: Not on file    Attends meetings of clubs or organizations: Not on file    Relationship status: Not on file  . Intimate partner violence    Fear of current or ex partner: Not on file    Emotionally abused: Not on file    Physically abused: Not on file    Forced sexual activity: Not on file  Other Topics Concern  . Not on file  Social History Narrative  . Not on file    FAMILY HISTORY: Family History  Adopted: Yes  Family history unknown: Yes    ALLERGIES:  is allergic to acarbose; demerol [meperidine hcl]; meperidine; and pioglitazone.  MEDICATIONS:  Current Outpatient Medications  Medication Sig Dispense Refill  . atorvastatin (LIPITOR) 20 MG tablet TAKE 1 TABLET BY MOUTH ONCE DAILY 30 tablet 0  . azaCITIDine in lactated ringers infusion Inject 100 mg/m2 into  the vein. For 5 days once a month    . furosemide (LASIX) 20 MG tablet TAKE 1 TABLET BY MOUTH ONCE DAILY 30 tablet 5  . glimepiride (AMARYL) 4 MG tablet TAKE 1 TABLET BY MOUTH ONCE DAILY 90 tablet 1  . GNP VITAMIN B-12 1000 MCG TBCR TAKE 1 TABLET BY MOUTH ONCE DAILY 90 tablet 1  . Lancets (ONETOUCH ULTRASOFT) lancets Use as instructed 100 each 12  . levothyroxine (SYNTHROID, LEVOTHROID) 50 MCG tablet TAKE 1 TABLET BY MOUTH ONCE DAILY ON AN EMPTY STOMACH. WAIT 30 MINUTES BEFORE TAKING OTHER MEDS. 90 tablet 1  . loperamide (IMODIUM) 2 MG capsule Take 2 mg by mouth as needed for diarrhea or loose stools.    Marland Kitchen NOVOLOG MIX 70/30 FLEXPEN (70-30) 100 UNIT/ML FlexPen Inject 0.08 mLs (8 Units total) into the skin 2 (two) times daily with a meal. 21 mL 5  . nystatin  (MYCOSTATIN/NYSTOP) powder nystatin 100,000 unit/gram topical powder   1 app by topical route.    . ondansetron (ZOFRAN) 8 MG tablet TAKE 1 TABLET BY MOUTH TWICE DAILY AS NEEDED NAUSEA AND VOMITING 30 tablet 1  . ONETOUCH ULTRA test strip TEST TWICE DAILY 200 each 1  . pantoprazole (PROTONIX) 40 MG tablet TAKE 1 TABLET BY MOUTH ONCE DAILY 90 tablet 1  . quinapril (ACCUPRIL) 20 MG tablet TAKE 1 TABLET BY MOUTH ONCE DAILY 90 tablet 1  . sulfaSALAzine (AZULFIDINE) 500 MG tablet TAKE 1 TABLET BY MOUTH AT BEDTIME 90 tablet 1  . ULTICARE MICRO PEN NEEDLES 32G X 4 MM MISC Inject 1 each into the skin 2 (two) times daily. 100 each 12   No current facility-administered medications for this visit.      PHYSICAL EXAMINATION: ECOG PERFORMANCE STATUS: 1 - Symptomatic but completely ambulatory Vitals:   04/21/19 0931  BP: 130/66  Pulse: (!) 101  Resp: 18  Temp: (!) 97.3 F (36.3 C)   Filed Weights   04/21/19 0931  Weight: 166 lb 11.2 oz (75.6 kg)    Physical Exam Constitutional:      General: She is not in acute distress. HENT:     Head: Normocephalic and atraumatic.  Eyes:     General: No scleral icterus.    Conjunctiva/sclera: Conjunctivae normal.     Pupils: Pupils are equal, round, and reactive to light.  Neck:     Musculoskeletal: Normal range of motion and neck supple.  Cardiovascular:     Rate and Rhythm: Normal rate and regular rhythm.     Heart sounds: Normal heart sounds.  Pulmonary:     Effort: Pulmonary effort is normal. No respiratory distress.     Breath sounds: Normal breath sounds. No wheezing or rales.  Chest:     Chest wall: No tenderness.  Abdominal:     General: Bowel sounds are normal. There is no distension.     Palpations: Abdomen is soft. There is no mass.     Tenderness: There is no abdominal tenderness.  Musculoskeletal: Normal range of motion.        General: No deformity.  Lymphadenopathy:     Cervical: No cervical adenopathy.  Skin:    General:  Skin is warm and dry.     Findings: No erythema or rash.  Neurological:     Mental Status: She is alert and oriented to person, place, and time.     Cranial Nerves: No cranial nerve deficit.     Coordination: Coordination normal.  Psychiatric:  Behavior: Behavior normal.        Thought Content: Thought content normal.      LABORATORY DATA:  I have reviewed the data as listed Lab Results  Component Value Date   WBC 0.9 (LL) 04/21/2019   HGB 7.8 (L) 04/21/2019   HCT 26.1 (L) 04/21/2019   MCV 96.3 04/21/2019   PLT 75 (L) 04/21/2019   Recent Labs    03/18/19 1303 03/24/19 1315 04/21/19 0904  NA 138 138 138  K 4.4 4.3 4.3  CL 105 107 108  CO2 22 24 22   GLUCOSE 230* 155* 265*  BUN 27* 20 22  CREATININE 1.17* 1.05* 1.35*  CALCIUM 8.7* 8.5* 8.6*  GFRNONAA 44* 50* 37*  GFRAA 51* 58* 43*  PROT 6.6 6.4* 6.5  ALBUMIN 3.8 3.4* 3.6  AST 15 15 16   ALT 12 13 12   ALKPHOS 114 111 102  BILITOT 0.5 0.6 0.6       ASSESSMENT & PLAN:  1. MDS (myelodysplastic syndrome) with 5q deletion ([place])   2. Type 2 diabetes mellitus without complication, with long-term current use of insulin ([place])   3. Thrombocytopenia ([place])   4. Encounter for antineoplastic chemotherapy   5. Diarrhea, unspecified type   6. 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 Chemotherapy-induced neutropenia ([facility])   8. Chronic diarrhea     # MDS IPSS-R high risk, >3 cytogenetics abnormality/platelet counts less than 50,000 Revlimid not indicated due to complex cytogenetics including- 5q deletion  Patient has been doing well on azacitidine.  Repeat bone marrow in June 2020 did not show any increased blast. Good response with improvement of thrombocytopenia. Labs are reviewed and discussed with patient. Hold treatments due to cytopenia.  See below.  We discussed that it seems like her bone marrow is not able to recover 4 weeks after each treatments.  We will try treatments every 5 weeks to allow better  recovery.  #Chemotherapy-induced neutropenia, ANC 0.3 today.  Anticipate to recover soon. Hold chemotherapy today. #Anemia, hemoglobin less than 8.  7.8 today.  Chemotherapy-induced.  Continue to monitor.  Hold chemotherapy today. Repeat blood work in 1 week.  If ANC >=1,will proceed with treatments.  #Thrombocytopenia, platelet count 75,000.  #Chronic diarrhea, reported history of being diagnosed with chronic colitis in the well.  Patient is on sulfasalazine.  Has not followed up with gastroenterology since moved to [facility] in 2019. Follow-up with Dr. Vicente Males. #Diabetes, will check HbA1c, requested by primary care provider. Follow-up in 6 weeks for evaluation prior to next chemotherapy treatment   Earlie Server, MD, PhD 04/21/2019

## 2019-04-21 NOTE — Telephone Encounter (Signed)
Spoke to [place] regarding patient going to them for labs ordered by PCP. She states patient said she was told that she has to get the ordered A1C at [facility]. I explained to [place] at [facility] after verifying with PCP that we would never send a patient there for labs after the message was explaining to [place] that we must call first but that they Can Not draw our labs. Patient should have went to [facility] lab down stairs but not [facility]. A1C never drawn. I will call patient if not done after she [place].

## 2019-04-22 ENCOUNTER — Inpatient Hospital Stay: Payer: Medicare Other

## 2019-04-22 ENCOUNTER — Other Ambulatory Visit: Payer: Self-pay | Admitting: Internal Medicine

## 2019-04-23 ENCOUNTER — Inpatient Hospital Stay: Payer: Medicare Other

## 2019-04-24 ENCOUNTER — Inpatient Hospital Stay: Payer: Medicare Other

## 2019-04-25 ENCOUNTER — Inpatient Hospital Stay: Payer: Medicare Other

## 2019-04-28 ENCOUNTER — Inpatient Hospital Stay: Payer: Medicare Other

## 2019-04-28 ENCOUNTER — Other Ambulatory Visit: Payer: Self-pay

## 2019-04-28 DIAGNOSIS — E079 Disorder of thyroid, unspecified: Secondary | ICD-10-CM | POA: Diagnosis not present

## 2019-04-28 DIAGNOSIS — Z5111 Encounter for antineoplastic chemotherapy: Secondary | ICD-10-CM | POA: Diagnosis not present

## 2019-04-28 DIAGNOSIS — D701 Agranulocytosis secondary to cancer chemotherapy: Secondary | ICD-10-CM | POA: Diagnosis not present

## 2019-04-28 DIAGNOSIS — D46C Myelodysplastic syndrome with isolated del(5q) chromosomal abnormality: Secondary | ICD-10-CM | POA: Diagnosis not present

## 2019-04-28 DIAGNOSIS — D619 Aplastic anemia, unspecified: Secondary | ICD-10-CM | POA: Diagnosis not present

## 2019-04-28 DIAGNOSIS — E119 Type 2 diabetes mellitus without complications: Secondary | ICD-10-CM | POA: Diagnosis not present

## 2019-04-28 LAB — COMPREHENSIVE METABOLIC PANEL
ALT: 12 U/L (ref 0–44)
AST: 14 U/L — ABNORMAL LOW (ref 15–41)
Albumin: 3.5 g/dL (ref 3.5–5.0)
Alkaline Phosphatase: 104 U/L (ref 38–126)
Anion gap: 6 (ref 5–15)
BUN: 19 mg/dL (ref 8–23)
CO2: 24 mmol/L (ref 22–32)
Calcium: 8.6 mg/dL — ABNORMAL LOW (ref 8.9–10.3)
Chloride: 105 mmol/L (ref 98–111)
Creatinine, Ser: 1.06 mg/dL — ABNORMAL HIGH (ref 0.44–1.00)
GFR calc Af Amer: 58 mL/min — ABNORMAL LOW (ref >60)
GFR calc non Af Amer: 50 mL/min — ABNORMAL LOW (ref >60)
Glucose, Bld: 220 mg/dL — ABNORMAL HIGH (ref 70–99)
Potassium: 4.4 mmol/L (ref 3.5–5.1)
Sodium: 135 mmol/L (ref 135–145)
Total Bilirubin: 0.5 mg/dL (ref 0.3–1.2)
Total Protein: 6.6 g/dL (ref 6.5–8.1)

## 2019-04-28 LAB — CBC WITH DIFFERENTIAL/PLATELET
Abs Immature Granulocytes: 0.01 10*3/uL (ref 0.00–0.07)
Basophils Absolute: 0 10*3/uL (ref 0.0–0.1)
Basophils Relative: 1 %
Eosinophils Absolute: 0.1 10*3/uL (ref 0.0–0.5)
Eosinophils Relative: 3 %
HCT: 27.9 % — ABNORMAL LOW (ref 36.0–46.0)
Hemoglobin: 8.2 g/dL — ABNORMAL LOW (ref 12.0–15.0)
Immature Granulocytes: 1 %
Lymphocytes Relative: 47 %
Lymphs Abs: 1 10*3/uL (ref 0.7–4.0)
MCH: 28.3 pg (ref 26.0–34.0)
MCHC: 29.4 g/dL — ABNORMAL LOW (ref 30.0–36.0)
MCV: 96.2 fL (ref 80.0–100.0)
Monocytes Absolute: 0.3 10*3/uL (ref 0.1–1.0)
Monocytes Relative: 15 %
Neutro Abs: 0.7 10*3/uL — ABNORMAL LOW (ref 1.7–7.7)
Neutrophils Relative %: 33 %
Platelets: 55 10*3/uL — ABNORMAL LOW (ref 150–400)
RBC: 2.9 MIL/uL — ABNORMAL LOW (ref 3.87–5.11)
RDW: 20.8 % — ABNORMAL HIGH (ref 11.5–15.5)
WBC: 2 10*3/uL — ABNORMAL LOW (ref 4.0–10.5)
nRBC: 0 % (ref 0.0–0.2)

## 2019-04-29 ENCOUNTER — Inpatient Hospital Stay: Payer: Medicare Other

## 2019-04-29 ENCOUNTER — Ambulatory Visit: Payer: Medicare Other | Admitting: Gastroenterology

## 2019-04-30 ENCOUNTER — Inpatient Hospital Stay: Payer: Medicare Other

## 2019-05-01 ENCOUNTER — Inpatient Hospital Stay: Payer: Medicare Other

## 2019-05-02 ENCOUNTER — Other Ambulatory Visit: Payer: Self-pay

## 2019-05-02 ENCOUNTER — Ambulatory Visit: Payer: Medicare Other

## 2019-05-02 NOTE — Progress Notes (Signed)
Patient pre screened for office appointment, no questions or concerns today. 

## 2019-05-05 ENCOUNTER — Inpatient Hospital Stay: Payer: Medicare Other

## 2019-05-05 ENCOUNTER — Inpatient Hospital Stay (HOSPITAL_BASED_OUTPATIENT_CLINIC_OR_DEPARTMENT_OTHER): Payer: Medicare Other | Admitting: Oncology

## 2019-05-05 ENCOUNTER — Other Ambulatory Visit: Payer: Self-pay

## 2019-05-05 ENCOUNTER — Encounter: Payer: Self-pay | Admitting: Oncology

## 2019-05-05 VITALS — BP 152/72 | HR 92 | Temp 97.6°F | Resp 18 | Wt 168.8 lb

## 2019-05-05 DIAGNOSIS — D619 Aplastic anemia, unspecified: Secondary | ICD-10-CM | POA: Diagnosis not present

## 2019-05-05 DIAGNOSIS — E079 Disorder of thyroid, unspecified: Secondary | ICD-10-CM | POA: Diagnosis not present

## 2019-05-05 DIAGNOSIS — D46C Myelodysplastic syndrome with isolated del(5q) chromosomal abnormality: Secondary | ICD-10-CM

## 2019-05-05 DIAGNOSIS — K529 Noninfective gastroenteritis and colitis, unspecified: Secondary | ICD-10-CM | POA: Diagnosis not present

## 2019-05-05 DIAGNOSIS — Z5111 Encounter for antineoplastic chemotherapy: Secondary | ICD-10-CM

## 2019-05-05 DIAGNOSIS — D6189 Other specified aplastic anemias and other bone marrow failure syndromes: Secondary | ICD-10-CM

## 2019-05-05 DIAGNOSIS — E034 Atrophy of thyroid (acquired): Secondary | ICD-10-CM

## 2019-05-05 DIAGNOSIS — D701 Agranulocytosis secondary to cancer chemotherapy: Secondary | ICD-10-CM

## 2019-05-05 DIAGNOSIS — D696 Thrombocytopenia, unspecified: Secondary | ICD-10-CM | POA: Diagnosis not present

## 2019-05-05 DIAGNOSIS — T451X5A Adverse effect of antineoplastic and immunosuppressive drugs, initial encounter: Secondary | ICD-10-CM

## 2019-05-05 DIAGNOSIS — E119 Type 2 diabetes mellitus without complications: Secondary | ICD-10-CM | POA: Diagnosis not present

## 2019-05-05 LAB — CBC WITH DIFFERENTIAL/PLATELET
Abs Immature Granulocytes: 0.01 10*3/uL (ref 0.00–0.07)
Basophils Absolute: 0 10*3/uL (ref 0.0–0.1)
Basophils Relative: 0 %
Eosinophils Absolute: 0 10*3/uL (ref 0.0–0.5)
Eosinophils Relative: 1 %
HCT: 28.3 % — ABNORMAL LOW (ref 36.0–46.0)
Hemoglobin: 8.2 g/dL — ABNORMAL LOW (ref 12.0–15.0)
Immature Granulocytes: 0 %
Lymphocytes Relative: 32 %
Lymphs Abs: 1 10*3/uL (ref 0.7–4.0)
MCH: 27.8 pg (ref 26.0–34.0)
MCHC: 29 g/dL — ABNORMAL LOW (ref 30.0–36.0)
MCV: 95.9 fL (ref 80.0–100.0)
Monocytes Absolute: 0.5 10*3/uL (ref 0.1–1.0)
Monocytes Relative: 16 %
Neutro Abs: 1.7 10*3/uL (ref 1.7–7.7)
Neutrophils Relative %: 51 %
Platelets: 37 10*3/uL — ABNORMAL LOW (ref 150–400)
RBC: 2.95 MIL/uL — ABNORMAL LOW (ref 3.87–5.11)
RDW: 21 % — ABNORMAL HIGH (ref 11.5–15.5)
WBC: 3.2 10*3/uL — ABNORMAL LOW (ref 4.0–10.5)
nRBC: 0 % (ref 0.0–0.2)

## 2019-05-05 LAB — COMPREHENSIVE METABOLIC PANEL
ALT: 12 U/L (ref 0–44)
AST: 15 U/L (ref 15–41)
Albumin: 3.6 g/dL (ref 3.5–5.0)
Alkaline Phosphatase: 111 U/L (ref 38–126)
Anion gap: 7 (ref 5–15)
BUN: 18 mg/dL (ref 8–23)
CO2: 25 mmol/L (ref 22–32)
Calcium: 8.5 mg/dL — ABNORMAL LOW (ref 8.9–10.3)
Chloride: 104 mmol/L (ref 98–111)
Creatinine, Ser: 1.13 mg/dL — ABNORMAL HIGH (ref 0.44–1.00)
GFR calc Af Amer: 54 mL/min — ABNORMAL LOW (ref >60)
GFR calc non Af Amer: 46 mL/min — ABNORMAL LOW (ref >60)
Glucose, Bld: 235 mg/dL — ABNORMAL HIGH (ref 70–99)
Potassium: 4.4 mmol/L (ref 3.5–5.1)
Sodium: 136 mmol/L (ref 135–145)
Total Bilirubin: 0.7 mg/dL (ref 0.3–1.2)
Total Protein: 6.7 g/dL (ref 6.5–8.1)

## 2019-05-05 LAB — TSH: TSH: 2.113 u[IU]/mL (ref 0.350–4.500)

## 2019-05-05 MED ORDER — AZACITIDINE CHEMO SQ INJECTION
75.0000 mg/m2 | Freq: Once | INTRAMUSCULAR | Status: AC
  Administered 2019-05-05: 15:00:00 132.5 mg via SUBCUTANEOUS
  Filled 2019-05-05: qty 5.3

## 2019-05-05 MED ORDER — ONDANSETRON HCL 4 MG PO TABS
8.0000 mg | ORAL_TABLET | Freq: Once | ORAL | Status: DC
  Filled 2019-05-05: qty 2

## 2019-05-06 ENCOUNTER — Other Ambulatory Visit: Payer: Self-pay

## 2019-05-06 ENCOUNTER — Inpatient Hospital Stay: Payer: Medicare Other

## 2019-05-06 VITALS — BP 114/63 | HR 97 | Temp 98.0°F | Resp 18

## 2019-05-06 DIAGNOSIS — D619 Aplastic anemia, unspecified: Secondary | ICD-10-CM | POA: Diagnosis not present

## 2019-05-06 DIAGNOSIS — D46C Myelodysplastic syndrome with isolated del(5q) chromosomal abnormality: Secondary | ICD-10-CM

## 2019-05-06 DIAGNOSIS — D701 Agranulocytosis secondary to cancer chemotherapy: Secondary | ICD-10-CM | POA: Diagnosis not present

## 2019-05-06 DIAGNOSIS — E119 Type 2 diabetes mellitus without complications: Secondary | ICD-10-CM | POA: Diagnosis not present

## 2019-05-06 DIAGNOSIS — E079 Disorder of thyroid, unspecified: Secondary | ICD-10-CM | POA: Diagnosis not present

## 2019-05-06 DIAGNOSIS — Z5111 Encounter for antineoplastic chemotherapy: Secondary | ICD-10-CM | POA: Diagnosis not present

## 2019-05-06 MED ORDER — AZACITIDINE CHEMO SQ INJECTION
75.0000 mg/m2 | Freq: Once | INTRAMUSCULAR | Status: AC
  Administered 2019-05-06: 14:00:00 132.5 mg via SUBCUTANEOUS
  Filled 2019-05-06: qty 5.3

## 2019-05-06 MED ORDER — ONDANSETRON HCL 4 MG PO TABS: 8.0000 mg | ORAL_TABLET | Freq: Once | ORAL | Status: DC

## 2019-05-06 NOTE — Progress Notes (Signed)
Hematology/Oncology Follow up note [facility] Telephone:([phone] Fax:(336) [phone]   Patient Care Team: Glean Hess, MD as PCP - General (Internal Medicine) Earlie Server, MD as Consulting Physician (Oncology)  REFERRING PROVIDER: Glean Hess, MD  Previous Oncologist Dr.Srujitha Murkutla REASON FOR VISIT Follow up for treatment of MDS  HISTORY OF PRESENTING ILLNESS:  Yesenia Hart is a  78 y.o.  female who recently moved from [place] to [place].  She used to live at [place] with husband.  Accompanied by daughter, [name] who is a respiratory therapist at [employer].  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  Patient presented to emergency room in January 2019 with complaints of intermittent rectal bleeding and increased to have a hemoglobin of 3 and platelet counts was 59,000.  Received 5 units of PRBC and was discharged home with hemoglobin of 10.  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  Cauterization was deferred due to thrombocytopenia. Aspirin was discontinued due to GI bleeding. Peripheral blood flow, SPEP negative, no obvious signs of B12 deficiency.  Ultrasound of the abdomen showed mild fatty liver.  On October 04, 2017, patient underwent bone marrow biopsy. Bone marrow core biopsy, bone marrow aspirate clot, bone marrow aspirate smears showed  single linage dysplasia.  Less than 5% bone marrow blasts and no circulating blast. 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  20 metaphases, lites were normal and 18 metaphases 5 q.  deletion, it has translocation involving chromosome of 3, 17, 16, and iso-di centric 22.  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  Patient received IV iron infusion on moving to [place].  She has not had any treatment for MDS done due to the move.  Today patient is accompanied by her daughter to the clinic.  She reports feeling tired, no weight loss.  She reports easy bruising.  Intermittent epistaxis which spontaneously resolved after applying pressure denies any hematochezia, hematemesis, hemoptysis.  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 Although patient does have 5q deletion which if isolated, is a good cytogentic group, she carried other cytogentic abnormalities which increases her IPSS-R score.   #12/26/2018 repeat bone marrow biopsy results were discussed with patient. Bone marrow biopsy showed normocellular, dysplastic erythroid and megakaryocyte lineages.  Note increase of blast.  Consistent with MDS. MDS FISH panel showed deletion 5 q. Negative for monosomy 5, deletion 7 q., monosomy 7, trisomy 8 deletion 20 q. Cytogenetics reveals a single cell with del(3)(p21) and del(5)(q13q33).  INTERVAL HISTORY Yesenia Hart is a 78 y.o. female who has above history reviewed by me today presents for discussion of bone marrow biopsy results and assessment prior to MDS treatment with azacitidine.  Patient reports feeling well today.  Denies fever, chills, nausea, vomiting, diarrhea, chest pain, shortness of breath, abdominal pain, urinary symptoms, lower extremity swelling.  + easy bruising. Chronic colitis, follows up with GI, on sulfasalazine.  Intermittent diarrhea . She was recommended to do colonoscopy and she declined. AVMs on last colonoscopy Chronic fatigue, unchanged. .  Review of Systems  Constitutional: Positive for fatigue. Negative for appetite change, chills  and fever.   HENT:   Negative for hearing loss and voice change.   Eyes: Negative for eye problems.  Respiratory: Negative for chest tightness and cough.   Cardiovascular: Negative for chest pain.  Gastrointestinal: Positive for diarrhea. Negative for abdominal distention, abdominal pain and blood in stool.  Endocrine: Negative for hot flashes.  Genitourinary: Negative for difficulty urinating and frequency.   Musculoskeletal: Negative for arthralgias.  Skin: Negative for itching and rash.  Neurological: Negative for extremity weakness.  Hematological: Negative for adenopathy.  Psychiatric/Behavioral: Negative for confusion.    MEDICAL HISTORY:  Past Medical History:  Diagnosis Date  . Anemia   . B12 deficiency 06/10/2018  . Clotting disorder ([place])   . Diabetes mellitus without complication ([place])   . IBS (irritable bowel syndrome)   . Iron deficiency anemia due to chronic blood loss 12/12/2017  . MDS (myelodysplastic syndrome) ([place])   . MDS (myelodysplastic syndrome) ([place])   . Thyroid disease     SURGICAL HISTORY: Past Surgical History:  Procedure Laterality Date  . APPENDECTOMY    . breast biopsy 51'    . CHOLECYSTECTOMY    . CORONARY ANGIOPLASTY WITH STENT PLACEMENT  2016   in [place]: Social History   Socioeconomic History  . Marital status: Married    Spouse name: Not on file  . Number of children: Not on file  . Years of education: Not on file  . Highest education level: Not on file  Occupational History  . Occupation: retired  Scientific laboratory technician  . Financial resource strain: Not on file  . Food insecurity    Worry: Not on file    Inability: Not on file  . Transportation needs    Medical: Not on file    Non-medical: Not on file  Tobacco Use  . Smoking status: Former Smoker    Packs/day: 1.00    Types: Cigarettes    Quit date: 1999    Years since quitting: 21.8  . Smokeless tobacco: Never Used  Substance and Sexual Activity  . Alcohol use: Not  Currently  . Drug use: Never  . Sexual activity: Yes  Lifestyle  . Physical activity    Days per week: Not on file    Minutes per session: Not on file  . Stress: Not on file  Relationships  . Social Herbalist on phone: Not on file    Gets together: Not on file    Attends religious service: Not on file    Active member of club or organization: Not on file    Attends meetings of clubs or organizations: Not on file    Relationship status: Not on file  . Intimate partner violence    Fear of current or ex partner: Not on file    Emotionally abused: Not on file    Physically abused: Not on file    Forced sexual activity: Not on file  Other Topics Concern  . Not on file  Social History Narrative  . Not on file    FAMILY HISTORY: Family History  Adopted: Yes  Family history unknown: Yes    ALLERGIES:  is allergic to acarbose; demerol [meperidine hcl]; meperidine; and pioglitazone.  MEDICATIONS:  Current Outpatient Medications  Medication Sig Dispense Refill  . atorvastatin (LIPITOR) 20 MG tablet TAKE 1 TABLET BY MOUTH ONCE DAILY 30 tablet 0  . azaCITIDine in lactated ringers infusion Inject 100 mg/m2 into the vein. For 5 days once a  month    . furosemide (LASIX) 20 MG tablet TAKE 1 TABLET BY MOUTH ONCE DAILY 30 tablet 5  . glimepiride (AMARYL) 4 MG tablet TAKE 1 TABLET BY MOUTH ONCE DAILY 90 tablet 1  . GNP VITAMIN B-12 1000 MCG TBCR TAKE 1 TABLET BY MOUTH ONCE DAILY 90 tablet 1  . Lancets (ONETOUCH ULTRASOFT) lancets Use as instructed 100 each 12  . levothyroxine (SYNTHROID) 50 MCG tablet TAKE 1 TABLET BY MOUTH ONCE DAILY ON AN EMPTY STOMACH. WAIT 30 MINUTES BEFORE TAKING OTHER MEDS. 90 tablet 1  . loperamide (IMODIUM) 2 MG capsule Take 2 mg by mouth as needed for diarrhea or loose stools.    Marland Kitchen NOVOLOG MIX 70/30 FLEXPEN (70-30) 100 UNIT/ML FlexPen Inject 0.08 mLs (8 Units total) into the skin 2 (two) times daily with a meal. 21 mL 5  . nystatin (MYCOSTATIN/NYSTOP)  powder nystatin 100,000 unit/gram topical powder   1 app by topical route.    . ondansetron (ZOFRAN) 8 MG tablet TAKE 1 TABLET BY MOUTH TWICE DAILY AS NEEDED NAUSEA AND VOMITING 30 tablet 1  . ONETOUCH ULTRA test strip TEST TWICE DAILY 200 each 1  . pantoprazole (PROTONIX) 40 MG tablet TAKE 1 TABLET BY MOUTH ONCE DAILY 90 tablet 1  . quinapril (ACCUPRIL) 20 MG tablet TAKE 1 TABLET BY MOUTH ONCE DAILY 90 tablet 1  . sulfaSALAzine (AZULFIDINE) 500 MG tablet TAKE 1 TABLET BY MOUTH AT BEDTIME 90 tablet 1  . ULTICARE MICRO PEN NEEDLES 32G X 4 MM MISC Inject 1 each into the skin 2 (two) times daily. 100 each 12   No current facility-administered medications for this visit.      PHYSICAL EXAMINATION: ECOG PERFORMANCE STATUS: 1 - Symptomatic but completely ambulatory Vitals:   05/05/19 1339  BP: (!) 152/72  Pulse: 92  Resp: 18  Temp: 97.6 F (36.4 C)   Filed Weights   05/05/19 1339  Weight: 168 lb 12.8 oz (76.6 kg)    Physical Exam Constitutional:      General: She is not in acute distress. HENT:     Head: Normocephalic and atraumatic.  Eyes:     General: No scleral icterus.    Conjunctiva/sclera: Conjunctivae normal.     Pupils: Pupils are equal, round, and reactive to light.  Neck:     Musculoskeletal: Normal range of motion and neck supple.  Cardiovascular:     Rate and Rhythm: Normal rate and regular rhythm.     Heart sounds: Normal heart sounds.  Pulmonary:     Effort: Pulmonary effort is normal. No respiratory distress.     Breath sounds: Normal breath sounds. No wheezing or rales.  Chest:     Chest wall: No tenderness.  Abdominal:     General: Bowel sounds are normal. There is no distension.     Palpations: Abdomen is soft. There is no mass.     Tenderness: There is no abdominal tenderness.  Musculoskeletal: Normal range of motion.        General: No deformity.  Lymphadenopathy:     Cervical: No cervical adenopathy.  Skin:    General: Skin is warm and dry.      Findings: No erythema or rash.  Neurological:     Mental Status: She is alert and oriented to person, place, and time.     Cranial Nerves: No cranial nerve deficit.     Coordination: Coordination normal.  Psychiatric:        Behavior: Behavior normal.  Thought Content: Thought content normal.      LABORATORY DATA:  I have reviewed the data as listed Lab Results  Component Value Date   WBC 3.2 (L) 05/05/2019   HGB 8.2 (L) 05/05/2019   HCT 28.3 (L) 05/05/2019   MCV 95.9 05/05/2019   PLT 37 (L) 05/05/2019   Recent Labs    04/21/19 0904 04/28/19 1303 05/05/19 1321  NA 138 135 136  K 4.3 4.4 4.4  CL 108 105 104  CO2 22 24 25   GLUCOSE 265* 220* 235*  BUN 22 19 18   CREATININE 1.35* 1.06* 1.13*  CALCIUM 8.6* 8.6* 8.5*  GFRNONAA 37* 50* 46*  GFRAA 43* 58* 54*  PROT 6.5 6.6 6.7  ALBUMIN 3.6 3.5 3.6  AST 16 14* 15  ALT 12 12 12   ALKPHOS 102 104 111  BILITOT 0.6 0.5 0.7       ASSESSMENT & PLAN:  1. 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 Thrombocytopenia ([place])   3. Encounter for antineoplastic chemotherapy   4. 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 Chemotherapy-induced neutropenia ([facility])   6. Chronic diarrhea     # MDS IPSS-R high risk, >3 cytogenetics abnormality/platelet counts less than 50,000 Revlimid not indicated due to complex cytogenetics including- 5q deletion  Patient has been doing well on azacitidine.  Repeat bone marrow in June 2020 did not show any increased blast. Slow recovery of blood counts recently.  Labs are reviewed and discussed with patient. Counts are acceptable to proceed with Azacitidine.   #Chemotherapy-induced neutropenia,ANC improved. Ok to proceed with treatment  # Thrombocytopenia, worsened  platelet count decreased. ?disease progression vs other etiology.  Close monitor. Repeat cbc in 2 weeks.  May need to repeat bone marrow biopsy if persistently low.   #Chronic diarrhea due to colitis in  the well.  Patient is on sulfasalazine. Follow-up with Dr. Vicente Males. She declined colonoscopy.    Follow-up in 5 weeks for evaluation prior to next chemotherapy treatment   Earlie Server, MD, PhD 05/06/2019

## 2019-05-07 ENCOUNTER — Inpatient Hospital Stay: Payer: Medicare Other

## 2019-05-07 ENCOUNTER — Other Ambulatory Visit: Payer: Self-pay

## 2019-05-07 VITALS — BP 130/68 | HR 101 | Resp 20

## 2019-05-07 DIAGNOSIS — D46C Myelodysplastic syndrome with isolated del(5q) chromosomal abnormality: Secondary | ICD-10-CM | POA: Diagnosis not present

## 2019-05-07 DIAGNOSIS — D619 Aplastic anemia, unspecified: Secondary | ICD-10-CM | POA: Diagnosis not present

## 2019-05-07 DIAGNOSIS — E119 Type 2 diabetes mellitus without complications: Secondary | ICD-10-CM | POA: Diagnosis not present

## 2019-05-07 DIAGNOSIS — E079 Disorder of thyroid, unspecified: Secondary | ICD-10-CM | POA: Diagnosis not present

## 2019-05-07 DIAGNOSIS — Z5111 Encounter for antineoplastic chemotherapy: Secondary | ICD-10-CM | POA: Diagnosis not present

## 2019-05-07 DIAGNOSIS — D701 Agranulocytosis secondary to cancer chemotherapy: Secondary | ICD-10-CM | POA: Diagnosis not present

## 2019-05-07 MED ORDER — ONDANSETRON HCL 4 MG PO TABS: 8.0000 mg | ORAL_TABLET | Freq: Once | ORAL | Status: DC

## 2019-05-07 MED ORDER — AZACITIDINE CHEMO SQ INJECTION
75.0000 mg/m2 | Freq: Once | INTRAMUSCULAR | Status: AC
  Administered 2019-05-07: 14:00:00 132.5 mg via SUBCUTANEOUS
  Filled 2019-05-07: qty 5.3

## 2019-05-08 ENCOUNTER — Other Ambulatory Visit: Payer: Self-pay

## 2019-05-08 ENCOUNTER — Inpatient Hospital Stay: Payer: Medicare Other

## 2019-05-08 VITALS — BP 139/40 | HR 80 | Temp 98.6°F | Resp 18

## 2019-05-08 DIAGNOSIS — E079 Disorder of thyroid, unspecified: Secondary | ICD-10-CM | POA: Diagnosis not present

## 2019-05-08 DIAGNOSIS — D46C Myelodysplastic syndrome with isolated del(5q) chromosomal abnormality: Secondary | ICD-10-CM

## 2019-05-08 DIAGNOSIS — E119 Type 2 diabetes mellitus without complications: Secondary | ICD-10-CM | POA: Diagnosis not present

## 2019-05-08 DIAGNOSIS — D619 Aplastic anemia, unspecified: Secondary | ICD-10-CM | POA: Diagnosis not present

## 2019-05-08 DIAGNOSIS — Z5111 Encounter for antineoplastic chemotherapy: Secondary | ICD-10-CM | POA: Diagnosis not present

## 2019-05-08 DIAGNOSIS — D701 Agranulocytosis secondary to cancer chemotherapy: Secondary | ICD-10-CM | POA: Diagnosis not present

## 2019-05-08 MED ORDER — ONDANSETRON HCL 4 MG PO TABS: 8.0000 mg | ORAL_TABLET | Freq: Once | ORAL | Status: DC

## 2019-05-08 MED ORDER — AZACITIDINE CHEMO SQ INJECTION
75.0000 mg/m2 | Freq: Once | INTRAMUSCULAR | Status: AC
  Administered 2019-05-08: 132.5 mg via SUBCUTANEOUS
  Filled 2019-05-08: qty 5.3

## 2019-05-09 ENCOUNTER — Other Ambulatory Visit: Payer: Self-pay

## 2019-05-09 ENCOUNTER — Inpatient Hospital Stay: Payer: Medicare Other

## 2019-05-09 VITALS — BP 117/76 | HR 98 | Temp 97.6°F | Resp 20

## 2019-05-09 DIAGNOSIS — D46C Myelodysplastic syndrome with isolated del(5q) chromosomal abnormality: Secondary | ICD-10-CM

## 2019-05-09 DIAGNOSIS — D619 Aplastic anemia, unspecified: Secondary | ICD-10-CM | POA: Diagnosis not present

## 2019-05-09 DIAGNOSIS — E119 Type 2 diabetes mellitus without complications: Secondary | ICD-10-CM | POA: Diagnosis not present

## 2019-05-09 DIAGNOSIS — E079 Disorder of thyroid, unspecified: Secondary | ICD-10-CM | POA: Diagnosis not present

## 2019-05-09 DIAGNOSIS — D701 Agranulocytosis secondary to cancer chemotherapy: Secondary | ICD-10-CM | POA: Diagnosis not present

## 2019-05-09 DIAGNOSIS — Z5111 Encounter for antineoplastic chemotherapy: Secondary | ICD-10-CM | POA: Diagnosis not present

## 2019-05-09 MED ORDER — AZACITIDINE CHEMO SQ INJECTION
75.0000 mg/m2 | Freq: Once | INTRAMUSCULAR | Status: AC
  Administered 2019-05-09: 132.5 mg via SUBCUTANEOUS
  Filled 2019-05-09: qty 5.3

## 2019-05-09 NOTE — Progress Notes (Signed)
Patient took her own Zofran prior to her treatment

## 2019-05-13 ENCOUNTER — Other Ambulatory Visit: Payer: Self-pay

## 2019-05-13 ENCOUNTER — Ambulatory Visit: Payer: Medicare Other | Admitting: Gastroenterology

## 2019-05-16 ENCOUNTER — Other Ambulatory Visit: Payer: Self-pay | Admitting: Internal Medicine

## 2019-05-19 ENCOUNTER — Inpatient Hospital Stay: Payer: Medicare Other

## 2019-05-19 ENCOUNTER — Other Ambulatory Visit: Payer: Medicare Other

## 2019-05-19 ENCOUNTER — Other Ambulatory Visit: Payer: Self-pay

## 2019-05-19 ENCOUNTER — Ambulatory Visit: Payer: Medicare Other

## 2019-05-19 ENCOUNTER — Inpatient Hospital Stay: Payer: Medicare Other | Attending: Oncology

## 2019-05-19 ENCOUNTER — Ambulatory Visit: Payer: Medicare Other | Admitting: Oncology

## 2019-05-19 DIAGNOSIS — D696 Thrombocytopenia, unspecified: Secondary | ICD-10-CM | POA: Insufficient documentation

## 2019-05-19 DIAGNOSIS — Z7984 Long term (current) use of oral hypoglycemic drugs: Secondary | ICD-10-CM | POA: Insufficient documentation

## 2019-05-19 DIAGNOSIS — D46C Myelodysplastic syndrome with isolated del(5q) chromosomal abnormality: Secondary | ICD-10-CM | POA: Insufficient documentation

## 2019-05-19 DIAGNOSIS — Z79899 Other long term (current) drug therapy: Secondary | ICD-10-CM | POA: Insufficient documentation

## 2019-05-19 DIAGNOSIS — Z87891 Personal history of nicotine dependence: Secondary | ICD-10-CM | POA: Insufficient documentation

## 2019-05-19 DIAGNOSIS — E119 Type 2 diabetes mellitus without complications: Secondary | ICD-10-CM | POA: Insufficient documentation

## 2019-05-19 DIAGNOSIS — E079 Disorder of thyroid, unspecified: Secondary | ICD-10-CM | POA: Diagnosis not present

## 2019-05-19 LAB — CBC WITH DIFFERENTIAL/PLATELET
Abs Immature Granulocytes: 0.02 10*3/uL (ref 0.00–0.07)
Basophils Absolute: 0 10*3/uL (ref 0.0–0.1)
Basophils Relative: 0 %
Eosinophils Absolute: 0.1 10*3/uL (ref 0.0–0.5)
Eosinophils Relative: 3 %
HCT: 24.5 % — ABNORMAL LOW (ref 36.0–46.0)
Hemoglobin: 7.3 g/dL — ABNORMAL LOW (ref 12.0–15.0)
Immature Granulocytes: 1 %
Lymphocytes Relative: 38 %
Lymphs Abs: 1 10*3/uL (ref 0.7–4.0)
MCH: 28.1 pg (ref 26.0–34.0)
MCHC: 29.8 g/dL — ABNORMAL LOW (ref 30.0–36.0)
MCV: 94.2 fL (ref 80.0–100.0)
Monocytes Absolute: 0.2 10*3/uL (ref 0.1–1.0)
Monocytes Relative: 8 %
Neutro Abs: 1.3 10*3/uL — ABNORMAL LOW (ref 1.7–7.7)
Neutrophils Relative %: 50 %
Platelets: 20 10*3/uL — CL (ref 150–400)
RBC: 2.6 MIL/uL — ABNORMAL LOW (ref 3.87–5.11)
RDW: 21 % — ABNORMAL HIGH (ref 11.5–15.5)
Smear Review: NORMAL
WBC: 2.6 10*3/uL — ABNORMAL LOW (ref 4.0–10.5)
nRBC: 0.8 % — ABNORMAL HIGH (ref 0.0–0.2)

## 2019-05-22 ENCOUNTER — Other Ambulatory Visit: Payer: Self-pay | Admitting: Internal Medicine

## 2019-05-22 DIAGNOSIS — E1169 Type 2 diabetes mellitus with other specified complication: Secondary | ICD-10-CM

## 2019-05-22 DIAGNOSIS — E785 Hyperlipidemia, unspecified: Secondary | ICD-10-CM

## 2019-05-23 NOTE — Progress Notes (Signed)
Done... Pt has been scheduled for labs on 11/16. Pt is aware of her scheduled appt.

## 2019-05-26 ENCOUNTER — Inpatient Hospital Stay: Payer: Medicare Other

## 2019-05-26 ENCOUNTER — Other Ambulatory Visit: Payer: Self-pay

## 2019-05-26 DIAGNOSIS — D46C Myelodysplastic syndrome with isolated del(5q) chromosomal abnormality: Secondary | ICD-10-CM | POA: Diagnosis not present

## 2019-05-26 DIAGNOSIS — D696 Thrombocytopenia, unspecified: Secondary | ICD-10-CM | POA: Diagnosis not present

## 2019-05-26 DIAGNOSIS — E079 Disorder of thyroid, unspecified: Secondary | ICD-10-CM | POA: Diagnosis not present

## 2019-05-26 DIAGNOSIS — Z7984 Long term (current) use of oral hypoglycemic drugs: Secondary | ICD-10-CM | POA: Diagnosis not present

## 2019-05-26 DIAGNOSIS — Z87891 Personal history of nicotine dependence: Secondary | ICD-10-CM | POA: Diagnosis not present

## 2019-05-26 DIAGNOSIS — E119 Type 2 diabetes mellitus without complications: Secondary | ICD-10-CM | POA: Diagnosis not present

## 2019-05-26 LAB — COMPREHENSIVE METABOLIC PANEL
ALT: 10 U/L (ref 0–44)
AST: 15 U/L (ref 15–41)
Albumin: 3.5 g/dL (ref 3.5–5.0)
Alkaline Phosphatase: 107 U/L (ref 38–126)
Anion gap: 7 (ref 5–15)
BUN: 18 mg/dL (ref 8–23)
CO2: 23 mmol/L (ref 22–32)
Calcium: 8.5 mg/dL — ABNORMAL LOW (ref 8.9–10.3)
Chloride: 106 mmol/L (ref 98–111)
Creatinine, Ser: 1.17 mg/dL — ABNORMAL HIGH (ref 0.44–1.00)
GFR calc Af Amer: 51 mL/min — ABNORMAL LOW (ref >60)
GFR calc non Af Amer: 44 mL/min — ABNORMAL LOW (ref >60)
Glucose, Bld: 206 mg/dL — ABNORMAL HIGH (ref 70–99)
Potassium: 4.1 mmol/L (ref 3.5–5.1)
Sodium: 136 mmol/L (ref 135–145)
Total Bilirubin: 0.7 mg/dL (ref 0.3–1.2)
Total Protein: 6.5 g/dL (ref 6.5–8.1)

## 2019-05-26 LAB — CBC WITH DIFFERENTIAL/PLATELET
Abs Immature Granulocytes: 0.17 10*3/uL — ABNORMAL HIGH (ref 0.00–0.07)
Basophils Absolute: 0 10*3/uL (ref 0.0–0.1)
Basophils Relative: 1 %
Eosinophils Absolute: 0 10*3/uL (ref 0.0–0.5)
Eosinophils Relative: 3 %
HCT: 24.6 % — ABNORMAL LOW (ref 36.0–46.0)
Hemoglobin: 7.4 g/dL — ABNORMAL LOW (ref 12.0–15.0)
Immature Granulocytes: 13 %
Lymphocytes Relative: 53 %
Lymphs Abs: 0.7 10*3/uL (ref 0.7–4.0)
MCH: 28.6 pg (ref 26.0–34.0)
MCHC: 30.1 g/dL (ref 30.0–36.0)
MCV: 95 fL (ref 80.0–100.0)
Monocytes Absolute: 0.1 10*3/uL (ref 0.1–1.0)
Monocytes Relative: 4 %
Neutro Abs: 0.3 10*3/uL — ABNORMAL LOW (ref 1.7–7.7)
Neutrophils Relative %: 26 %
Platelets: 23 10*3/uL — CL (ref 150–400)
RBC: 2.59 MIL/uL — ABNORMAL LOW (ref 3.87–5.11)
RDW: 22.1 % — ABNORMAL HIGH (ref 11.5–15.5)
Smear Review: NORMAL
WBC: 1.3 10*3/uL — CL (ref 4.0–10.5)
nRBC: 2.3 % — ABNORMAL HIGH (ref 0.0–0.2)

## 2019-05-28 ENCOUNTER — Telehealth: Payer: Self-pay

## 2019-05-28 ENCOUNTER — Other Ambulatory Visit: Payer: Self-pay | Admitting: Internal Medicine

## 2019-05-28 NOTE — Telephone Encounter (Signed)
Please get lab appt scheduled.  I have also forwarded to Ander Purpura and Sonia Baller.

## 2019-05-28 NOTE — Telephone Encounter (Signed)
-----   Message from Earlie Server, MD sent at 05/28/2019  4:35 PM EST ----- I called patient and communicated the blood work results with her. She reports feeling well. No fever, chills.  Neutropenia precaution was discussed with her.  Chevelle Durr/Elizabeth, please arrange her to repeat cbc on 11/23 and forward results to Sonia Baller or [facility].   Thank  You.

## 2019-05-29 NOTE — Telephone Encounter (Signed)
Done... Repeat labs has been scheduled on 06/02/19 as requested. Patient is aware.Marland Kitchen

## 2019-05-30 ENCOUNTER — Other Ambulatory Visit: Payer: Self-pay

## 2019-06-02 ENCOUNTER — Encounter: Payer: Self-pay | Admitting: Oncology

## 2019-06-02 ENCOUNTER — Other Ambulatory Visit: Payer: Self-pay

## 2019-06-02 ENCOUNTER — Inpatient Hospital Stay: Payer: Medicare Other

## 2019-06-02 DIAGNOSIS — Z7984 Long term (current) use of oral hypoglycemic drugs: Secondary | ICD-10-CM | POA: Diagnosis not present

## 2019-06-02 DIAGNOSIS — D696 Thrombocytopenia, unspecified: Secondary | ICD-10-CM | POA: Diagnosis not present

## 2019-06-02 DIAGNOSIS — E119 Type 2 diabetes mellitus without complications: Secondary | ICD-10-CM | POA: Diagnosis not present

## 2019-06-02 DIAGNOSIS — D46C Myelodysplastic syndrome with isolated del(5q) chromosomal abnormality: Secondary | ICD-10-CM

## 2019-06-02 DIAGNOSIS — Z87891 Personal history of nicotine dependence: Secondary | ICD-10-CM | POA: Diagnosis not present

## 2019-06-02 DIAGNOSIS — E079 Disorder of thyroid, unspecified: Secondary | ICD-10-CM | POA: Diagnosis not present

## 2019-06-02 LAB — CBC WITH DIFFERENTIAL/PLATELET
Abs Immature Granulocytes: 0 10*3/uL (ref 0.00–0.07)
Basophils Absolute: 0 10*3/uL (ref 0.0–0.1)
Basophils Relative: 1 %
Eosinophils Absolute: 0 10*3/uL (ref 0.0–0.5)
Eosinophils Relative: 2 %
HCT: 24.6 % — ABNORMAL LOW (ref 36.0–46.0)
Hemoglobin: 7.1 g/dL — ABNORMAL LOW (ref 12.0–15.0)
Immature Granulocytes: 0 %
Lymphocytes Relative: 62 %
Lymphs Abs: 0.8 10*3/uL (ref 0.7–4.0)
MCH: 27.8 pg (ref 26.0–34.0)
MCHC: 28.9 g/dL — ABNORMAL LOW (ref 30.0–36.0)
MCV: 96.5 fL (ref 80.0–100.0)
Monocytes Absolute: 0.1 10*3/uL (ref 0.1–1.0)
Monocytes Relative: 4 %
Neutro Abs: 0.4 10*3/uL — ABNORMAL LOW (ref 1.7–7.7)
Neutrophils Relative %: 31 %
Platelets: 35 10*3/uL — ABNORMAL LOW (ref 150–400)
RBC: 2.55 MIL/uL — ABNORMAL LOW (ref 3.87–5.11)
RDW: 22.7 % — ABNORMAL HIGH (ref 11.5–15.5)
Smear Review: DECREASED
WBC: 1.3 10*3/uL — CL (ref 4.0–10.5)
nRBC: 3.1 % — ABNORMAL HIGH (ref 0.0–0.2)

## 2019-06-02 NOTE — Progress Notes (Signed)
Re: [employer] with patient about recent lab work.  Her [place] continues to be low.  Today it is 400.  Last week it was 300.  Hemoglobin has trended down slightly and is 7.1 today.  CBC    Component Value Date/Time   WBC 1.3 (LL) 06/02/2019 1253   RBC 2.55 (L) 06/02/2019 1253   HGB 7.1 (L) 06/02/2019 1253   HCT 24.6 (L) 06/02/2019 1253   PLT 35 (L) 06/02/2019 1253   MCV 96.5 06/02/2019 1253   MCH 27.8 06/02/2019 1253   MCHC 28.9 (L) 06/02/2019 1253   RDW 22.7 (H) 06/02/2019 1253   LYMPHSABS 0.8 06/02/2019 1253   MONOABS 0.1 06/02/2019 1253   EOSABS 0.0 06/02/2019 1253   BASOSABS 0.0 06/02/2019 1253   Reinforced neutropenic precautions.  She denies any recent fevers or acute illness.  States she feels "great".  We will speak with on-call physician with regards to slow recovery of her blood counts.  Spoke with Dr. Grayland Ormond and okay to watch her for now.  Bring her back as scheduled next week for repeat labs and possible treatment.  If anything changes in the interim, patient is to call clinic to discuss.  Faythe Casa, NP 06/02/2019 3:46 PM

## 2019-06-04 ENCOUNTER — Other Ambulatory Visit: Payer: Self-pay | Admitting: Internal Medicine

## 2019-06-04 DIAGNOSIS — I10 Essential (primary) hypertension: Secondary | ICD-10-CM

## 2019-06-09 ENCOUNTER — Inpatient Hospital Stay (HOSPITAL_BASED_OUTPATIENT_CLINIC_OR_DEPARTMENT_OTHER): Payer: Medicare Other | Admitting: Oncology

## 2019-06-09 ENCOUNTER — Other Ambulatory Visit: Payer: Self-pay

## 2019-06-09 ENCOUNTER — Inpatient Hospital Stay: Payer: Medicare Other

## 2019-06-09 ENCOUNTER — Ambulatory Visit: Payer: Medicare Other | Admitting: Gastroenterology

## 2019-06-09 ENCOUNTER — Encounter: Payer: Self-pay | Admitting: Oncology

## 2019-06-09 VITALS — BP 130/68 | HR 89 | Temp 97.6°F | Resp 20 | Wt 168.0 lb

## 2019-06-09 DIAGNOSIS — K529 Noninfective gastroenteritis and colitis, unspecified: Secondary | ICD-10-CM

## 2019-06-09 DIAGNOSIS — T451X5A Adverse effect of antineoplastic and immunosuppressive drugs, initial encounter: Secondary | ICD-10-CM

## 2019-06-09 DIAGNOSIS — D46C Myelodysplastic syndrome with isolated del(5q) chromosomal abnormality: Secondary | ICD-10-CM

## 2019-06-09 DIAGNOSIS — E119 Type 2 diabetes mellitus without complications: Secondary | ICD-10-CM | POA: Diagnosis not present

## 2019-06-09 DIAGNOSIS — D701 Agranulocytosis secondary to cancer chemotherapy: Secondary | ICD-10-CM | POA: Diagnosis not present

## 2019-06-09 DIAGNOSIS — Z87891 Personal history of nicotine dependence: Secondary | ICD-10-CM | POA: Diagnosis not present

## 2019-06-09 DIAGNOSIS — D696 Thrombocytopenia, unspecified: Secondary | ICD-10-CM

## 2019-06-09 DIAGNOSIS — D6189 Other specified aplastic anemias and other bone marrow failure syndromes: Secondary | ICD-10-CM | POA: Diagnosis not present

## 2019-06-09 DIAGNOSIS — Z7984 Long term (current) use of oral hypoglycemic drugs: Secondary | ICD-10-CM | POA: Diagnosis not present

## 2019-06-09 DIAGNOSIS — E079 Disorder of thyroid, unspecified: Secondary | ICD-10-CM | POA: Diagnosis not present

## 2019-06-09 LAB — CBC WITH DIFFERENTIAL/PLATELET
Abs Immature Granulocytes: 0 10*3/uL (ref 0.00–0.07)
Band Neutrophils: 2 %
Basophils Absolute: 0 10*3/uL (ref 0.0–0.1)
Basophils Relative: 1 %
Eosinophils Absolute: 0.1 10*3/uL (ref 0.0–0.5)
Eosinophils Relative: 4 %
HCT: 26.3 % — ABNORMAL LOW (ref 36.0–46.0)
Hemoglobin: 7.5 g/dL — ABNORMAL LOW (ref 12.0–15.0)
Lymphocytes Relative: 46 %
Lymphs Abs: 0.6 10*3/uL — ABNORMAL LOW (ref 0.7–4.0)
MCH: 28 pg (ref 26.0–34.0)
MCHC: 28.5 g/dL — ABNORMAL LOW (ref 30.0–36.0)
MCV: 98.1 fL (ref 80.0–100.0)
Monocytes Absolute: 0.1 10*3/uL (ref 0.1–1.0)
Monocytes Relative: 11 %
Neutro Abs: 0.5 10*3/uL — ABNORMAL LOW (ref 1.7–7.7)
Neutrophils Relative %: 36 %
Platelets: 30 10*3/uL — ABNORMAL LOW (ref 150–400)
RBC: 2.68 MIL/uL — ABNORMAL LOW (ref 3.87–5.11)
RDW: 23.1 % — ABNORMAL HIGH (ref 11.5–15.5)
Smear Review: DECREASED
WBC: 1.3 10*3/uL — CL (ref 4.0–10.5)
nRBC: 3.1 % — ABNORMAL HIGH (ref 0.0–0.2)

## 2019-06-09 LAB — COMPREHENSIVE METABOLIC PANEL
ALT: 12 U/L (ref 0–44)
AST: 18 U/L (ref 15–41)
Albumin: 3.5 g/dL (ref 3.5–5.0)
Alkaline Phosphatase: 102 U/L (ref 38–126)
Anion gap: 9 (ref 5–15)
BUN: 17 mg/dL (ref 8–23)
CO2: 21 mmol/L — ABNORMAL LOW (ref 22–32)
Calcium: 8.4 mg/dL — ABNORMAL LOW (ref 8.9–10.3)
Chloride: 104 mmol/L (ref 98–111)
Creatinine, Ser: 1.2 mg/dL — ABNORMAL HIGH (ref 0.44–1.00)
GFR calc Af Amer: 49 mL/min — ABNORMAL LOW (ref >60)
GFR calc non Af Amer: 43 mL/min — ABNORMAL LOW (ref >60)
Glucose, Bld: 311 mg/dL — ABNORMAL HIGH (ref 70–99)
Potassium: 4.7 mmol/L (ref 3.5–5.1)
Sodium: 134 mmol/L — ABNORMAL LOW (ref 135–145)
Total Bilirubin: 0.8 mg/dL (ref 0.3–1.2)
Total Protein: 6.4 g/dL — ABNORMAL LOW (ref 6.5–8.1)

## 2019-06-09 NOTE — Progress Notes (Signed)
Hematology/Oncology Follow up note [facility] [phone] [phone]   Patient Care Team: Glean Hess, MD as PCP - General (Internal Medicine) Earlie Server, MD as Consulting Physician (Oncology)  REFERRING PROVIDER: Glean Hess, MD  Previous Oncologist Dr.Srujitha Murkutla REASON FOR VISIT Follow up for treatment of MDS  HISTORY OF PRESENTING ILLNESS:  Yesenia Hart is a  78 y.o.  female who recently moved from [place] to [place].  She used to live at [place] with husband.  Accompanied by daughter, Yesenia Hart who is a respiratory therapist at [employer].  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  Patient presented to emergency room in January 2019 with complaints of intermittent rectal bleeding and increased to have a hemoglobin of 3 and platelet counts was 59,000.  Received 5 units of PRBC and was discharged home with hemoglobin of 10.  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  Cauterization was deferred due to thrombocytopenia. Aspirin was discontinued due to GI bleeding. Peripheral blood flow, SPEP negative, no obvious signs of B12 deficiency.  Ultrasound of the abdomen showed mild fatty liver.  On October 04, 2017, patient underwent bone marrow biopsy. Bone marrow core biopsy, bone marrow aspirate clot, bone marrow aspirate smears showed  single linage dysplasia.  Less than 5% bone marrow blasts and no circulating blast. Iron storage is nearly absent Peripheral blood smear showed moderate and anisopoikilocytosis Marrow aspirate normal in  adenoid maturation Cytogenetics performed at the [facility] in [place] showed 46,xx, t(3;17;16)(p21;q21;q24), add (5) (q11,2), idic(22)(p11,2)[18]/46,xx[2].  20 metaphases, lites were normal and 18 metaphases 5 q.  deletion, it has translocation involving chromosome of 3, 17, 16, and iso-di centric 22.  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  Patient received IV iron infusion on moving to [place].  She has not had any treatment for MDS done due to the move.  Today patient is accompanied by her daughter to the clinic.  She reports feeling tired, no weight loss.  She reports easy bruising.  Intermittent epistaxis which spontaneously resolved after applying pressure denies any hematochezia, hematemesis, hemoptysis.  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 Although patient does have 5q deletion which if isolated, is a good cytogentic group, she carried other cytogentic abnormalities which increases her IPSS-R score.   #12/26/2018 repeat bone marrow biopsy results were discussed with patient. Bone marrow biopsy showed normocellular, dysplastic erythroid and megakaryocyte lineages.  Note increase of blast.  Consistent with MDS. MDS FISH panel showed deletion 5 q. Negative for monosomy 5, deletion 7 q., monosomy 7, trisomy 8 deletion 20 q. Cytogenetics reveals a single cell with del(3)(p21) and del(5)(q13q33).  INTERVAL HISTORY Yesenia Hart is a 78 y.o. female who has above history reviewed by me today presents for discussion of bone marrow biopsy results and assessment prior to MDS treatment with azacitidine.  Patient reports feeling well at baseline today.  Chronic fatigue unchanged.  Positive for easy bruising.  Denies any fever, chills, nausea, vomiting.  Denies any chest pain, shortness of breath, abdominal pain. Chronic colitis with intermittent diarrhea.  She has been seen by gastroenterology.  Currently on sulfasalazine.    Review of Systems  Constitutional: Positive for fatigue. Negative for appetite change, chills and fever.  HENT:  Negative for hearing loss and voice change.   Eyes:  Negative for eye problems.  Respiratory: Negative for chest tightness and cough.   Cardiovascular: Negative for chest pain.  Gastrointestinal: Positive for diarrhea. Negative for abdominal distention, abdominal pain and blood in stool.  Endocrine: Negative for hot flashes.  Genitourinary: Negative for difficulty urinating and frequency.   Musculoskeletal: Negative for arthralgias.  Skin: Negative for itching and rash.  Neurological: Negative for extremity weakness.  Hematological: Negative for adenopathy.  Psychiatric/Behavioral: Negative for confusion.    MEDICAL HISTORY:  Past Medical History:  Diagnosis Date  . Anemia   . B12 deficiency 06/10/2018  . Clotting disorder ([place])   . Diabetes mellitus without complication ([place])   . IBS (irritable bowel syndrome)   . Iron deficiency anemia due to chronic blood loss 12/12/2017  . MDS (myelodysplastic syndrome) ([place])   . MDS (myelodysplastic syndrome) ([place])   . Thyroid disease     SURGICAL HISTORY: Past Surgical History:  Procedure Laterality Date  . APPENDECTOMY    . breast biopsy 87'    . CHOLECYSTECTOMY    . CORONARY ANGIOPLASTY WITH STENT PLACEMENT  2016   in [place]: Social History   Socioeconomic History  . Marital status: Married    Spouse name: Not on file  . Number of children: Not on file  . Years of education: Not on file  . Highest education level: Not on file  Occupational History  . Occupation: retired  Scientific laboratory technician  . Financial resource strain: Not on file  . Food insecurity    Worry: Not on file    Inability: Not on file  . Transportation needs    Medical: Not on file    Non-medical: Not on file  Tobacco Use  . Smoking status: Former Smoker    Packs/day: 1.00    Types: Cigarettes    Quit date: 1999    Years since quitting: 21.9  . Smokeless tobacco: Never Used  Substance and Sexual Activity  . Alcohol use: Not Currently  . Drug use: Never  . Sexual activity: Yes  Lifestyle   . Physical activity    Days per week: Not on file    Minutes per session: Not on file  . Stress: Not on file  Relationships  . Social Herbalist on phone: Not on file    Gets together: Not on file    Attends religious service: Not on file    Active member of club or organization: Not on file    Attends meetings of clubs or organizations: Not on file    Relationship status: Not on file  . Intimate partner violence    Fear of current or ex partner: Not on file    Emotionally abused: Not on file    Physically abused: Not on file    Forced sexual activity: Not on file  Other Topics Concern  . Not on file  Social History Narrative  . Not on file    FAMILY HISTORY: Family History  Adopted: Yes  Family history unknown: Yes    ALLERGIES:  is allergic to acarbose; demerol [meperidine hcl]; meperidine; and pioglitazone.  MEDICATIONS:  Current Outpatient Medications  Medication Sig Dispense Refill  . atorvastatin (LIPITOR) 20 MG tablet TAKE 1 TABLET BY MOUTH ONCE DAILY 30 tablet 5  . azaCITIDine in lactated ringers infusion Inject 100 mg/m2 into the vein. For 5 days once a month    . furosemide (  LASIX) 20 MG tablet TAKE 1 TABLET BY MOUTH ONCE DAILY 30 tablet 5  . glimepiride (AMARYL) 4 MG tablet TAKE 1 TABLET BY MOUTH ONCE DAILY 90 tablet 1  . GNP VITAMIN B-12 1000 MCG TBCR TAKE 1 TABLET BY MOUTH ONCE DAILY 90 tablet 1  . Lancets (ONETOUCH ULTRASOFT) lancets Use as instructed 100 each 12  . levothyroxine (SYNTHROID) 50 MCG tablet TAKE 1 TABLET BY MOUTH ONCE DAILY ON AN EMPTY STOMACH. WAIT 30 MINUTES BEFORE TAKING OTHER MEDS. 90 tablet 1  . loperamide (IMODIUM) 2 MG capsule Take 2 mg by mouth as needed for diarrhea or loose stools.    Marland Kitchen NOVOLOG MIX 70/30 FLEXPEN (70-30) 100 UNIT/ML FlexPen Inject 0.08 mLs (8 Units total) into the skin 2 (two) times daily with a meal. 21 mL 5  . nystatin (MYCOSTATIN/NYSTOP) powder nystatin 100,000 unit/gram topical powder   1 app by topical  route.    . ondansetron (ZOFRAN) 8 MG tablet TAKE 1 TABLET BY MOUTH TWICE DAILY AS NEEDED NAUSEA AND VOMITING 30 tablet 1  . ONETOUCH ULTRA test strip TEST TWICE DAILY 200 each 1  . pantoprazole (PROTONIX) 40 MG tablet TAKE 1 TABLET BY MOUTH ONCE DAILY 90 tablet 1  . quinapril (ACCUPRIL) 20 MG tablet TAKE 1 TABLET BY MOUTH ONCE DAILY 90 tablet 1  . sulfaSALAzine (AZULFIDINE) 500 MG tablet TAKE 1 TABLET BY MOUTH AT BEDTIME 90 tablet 1  . ULTICARE MICRO PEN NEEDLES 32G X 4 MM MISC Inject 1 each into the skin 2 (two) times daily. 100 each 12   No current facility-administered medications for this visit.      PHYSICAL EXAMINATION: ECOG PERFORMANCE STATUS: 1 - Symptomatic but completely ambulatory Vitals:   06/09/19 0948  BP: 130/68  Pulse: 89  Resp: 20  Temp: 97.6 F (36.4 C)   Filed Weights   06/09/19 0948  Weight: 168 lb (76.2 kg)    Physical Exam Constitutional:      General: She is not in acute distress. HENT:     Head: Normocephalic and atraumatic.  Eyes:     General: No scleral icterus.    Conjunctiva/sclera: Conjunctivae normal.     Pupils: Pupils are equal, round, and reactive to light.  Neck:     Musculoskeletal: Normal range of motion and neck supple.  Cardiovascular:     Rate and Rhythm: Normal rate and regular rhythm.     Heart sounds: Normal heart sounds.  Pulmonary:     Effort: Pulmonary effort is normal. No respiratory distress.     Breath sounds: Normal breath sounds. No wheezing or rales.  Chest:     Chest wall: No tenderness.  Abdominal:     General: Bowel sounds are normal. There is no distension.     Palpations: Abdomen is soft. There is no mass.     Tenderness: There is no abdominal tenderness.  Musculoskeletal: Normal range of motion.        General: No deformity.  Lymphadenopathy:     Cervical: No cervical adenopathy.  Skin:    General: Skin is warm and dry.     Findings: No erythema or rash.  Neurological:     Mental Status: She is alert  and oriented to person, place, and time.     Cranial Nerves: No cranial nerve deficit.     Coordination: Coordination normal.  Psychiatric:        Behavior: Behavior normal.        Thought Content: Thought content normal.  LABORATORY DATA:  I have reviewed the data as listed Lab Results  Component Value Date   WBC 1.3 (LL) 06/09/2019   HGB 7.5 (L) 06/09/2019   HCT 26.3 (L) 06/09/2019   MCV 98.1 06/09/2019   PLT 30 (L) 06/09/2019   Recent Labs    05/05/19 1321 05/26/19 1423 06/09/19 0933  NA 136 136 134*  K 4.4 4.1 4.7  CL 104 106 104  CO2 25 23 21*  GLUCOSE 235* 206* 311*  BUN 18 18 17   CREATININE 1.13* 1.17* 1.20*  CALCIUM 8.5* 8.5* 8.4*  GFRNONAA 46* 44* 43*  GFRAA 54* 51* 49*  PROT 6.7 6.5 6.4*  ALBUMIN 3.6 3.5 3.5  AST 15 15 18   ALT 12 10 12   ALKPHOS 111 107 102  BILITOT 0.7 0.7 0.8       ASSESSMENT & PLAN:  1. MDS (myelodysplastic syndrome) with 5q deletion ([place])   2. Thrombocytopenia ([place])   3. 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 Chemotherapy-induced neutropenia ([facility])   5. Chronic diarrhea     # MDS IPSS-R high risk, >3 cytogenetics abnormality/platelet counts less than 50,000 Revlimid not indicated due to complex cytogenetics including- 5q deletion  Patient has been doing well on azacitidine.  Repeat bone marrow in June 2020 did not show any increased blast. Patient continues to have slow recovery of blood counts. Labs are reviewed and discussed with patient. Hold chemotherapy today- dose decrease to 50% dose at next treatment.   #Grade 3 neutropenia, secondary to chemotherapy.  Hold chemotherapy.  Continue to monitor #Thrombocytopenia.  Hold chemotherapy. Check immature platelet fraction, Check B12, folate.  # Anemia, likely due to chemotherapy/ anemia due to CKD. Check iron panel.   Disease progression versus other etiology. Discussed with patient about a chemo break for 2 weeks to allow better recovery of bone marrow. May  need to repeat bone marrow biopsy if needed.  #Chronic colitis.  Recommend patient to continue follow-up with gastroenterology.  Continue sulfasalazine.  Follow-up in 2weeks for evaluation prior to next chemotherapy treatment   Earlie Server, MD, PhD 06/09/2019

## 2019-06-09 NOTE — Progress Notes (Signed)
Patient does not offer any problems today.  

## 2019-06-10 ENCOUNTER — Other Ambulatory Visit: Payer: Self-pay

## 2019-06-10 ENCOUNTER — Inpatient Hospital Stay: Payer: Medicare Other

## 2019-06-10 DIAGNOSIS — D6189 Other specified aplastic anemias and other bone marrow failure syndromes: Secondary | ICD-10-CM

## 2019-06-10 DIAGNOSIS — D46C Myelodysplastic syndrome with isolated del(5q) chromosomal abnormality: Secondary | ICD-10-CM

## 2019-06-10 DIAGNOSIS — D696 Thrombocytopenia, unspecified: Secondary | ICD-10-CM

## 2019-06-10 DIAGNOSIS — T451X5A Adverse effect of antineoplastic and immunosuppressive drugs, initial encounter: Secondary | ICD-10-CM

## 2019-06-10 DIAGNOSIS — D701 Agranulocytosis secondary to cancer chemotherapy: Secondary | ICD-10-CM

## 2019-06-10 DIAGNOSIS — K529 Noninfective gastroenteritis and colitis, unspecified: Secondary | ICD-10-CM

## 2019-06-10 LAB — IRON AND TIBC
Iron: 78 ug/dL (ref 28–170)
Saturation Ratios: 34 % — ABNORMAL HIGH (ref 10.4–31.8)
TIBC: 231 ug/dL — ABNORMAL LOW (ref 250–450)
UIBC: 153 ug/dL

## 2019-06-10 LAB — IMMATURE PLATELET FRACTION: Immature Platelet Fraction: 18.2 % — ABNORMAL HIGH (ref 1.2–8.6)

## 2019-06-10 LAB — FERRITIN: Ferritin: 151 ng/mL (ref 11–307)

## 2019-06-11 ENCOUNTER — Inpatient Hospital Stay: Payer: Medicare Other

## 2019-06-12 ENCOUNTER — Ambulatory Visit: Payer: Medicare Other

## 2019-06-13 ENCOUNTER — Ambulatory Visit: Payer: Medicare Other

## 2019-06-13 ENCOUNTER — Telehealth: Payer: Self-pay | Admitting: Gastroenterology

## 2019-06-13 NOTE — Telephone Encounter (Signed)
I  Called patient to schedule an appointment with Dr Vicente Males since her reords have come in and she states she has to much going on right now & does not want to schedule. She will call back & schedule at a later time.

## 2019-06-23 ENCOUNTER — Encounter: Payer: Self-pay | Admitting: Oncology

## 2019-06-23 ENCOUNTER — Inpatient Hospital Stay (HOSPITAL_BASED_OUTPATIENT_CLINIC_OR_DEPARTMENT_OTHER): Payer: Medicare Other | Admitting: Oncology

## 2019-06-23 ENCOUNTER — Inpatient Hospital Stay: Payer: Medicare Other

## 2019-06-23 ENCOUNTER — Inpatient Hospital Stay: Payer: Medicare Other | Attending: Oncology

## 2019-06-23 ENCOUNTER — Other Ambulatory Visit: Payer: Self-pay

## 2019-06-23 VITALS — BP 135/75 | HR 93 | Temp 97.5°F | Resp 18 | Wt 168.7 lb

## 2019-06-23 DIAGNOSIS — T451X5A Adverse effect of antineoplastic and immunosuppressive drugs, initial encounter: Secondary | ICD-10-CM

## 2019-06-23 DIAGNOSIS — D701 Agranulocytosis secondary to cancer chemotherapy: Secondary | ICD-10-CM

## 2019-06-23 DIAGNOSIS — D6189 Other specified aplastic anemias and other bone marrow failure syndromes: Secondary | ICD-10-CM | POA: Diagnosis not present

## 2019-06-23 DIAGNOSIS — Z7984 Long term (current) use of oral hypoglycemic drugs: Secondary | ICD-10-CM | POA: Insufficient documentation

## 2019-06-23 DIAGNOSIS — K580 Irritable bowel syndrome with diarrhea: Secondary | ICD-10-CM | POA: Diagnosis not present

## 2019-06-23 DIAGNOSIS — D696 Thrombocytopenia, unspecified: Secondary | ICD-10-CM

## 2019-06-23 DIAGNOSIS — Z87891 Personal history of nicotine dependence: Secondary | ICD-10-CM | POA: Insufficient documentation

## 2019-06-23 DIAGNOSIS — D61818 Other pancytopenia: Secondary | ICD-10-CM | POA: Diagnosis not present

## 2019-06-23 DIAGNOSIS — E119 Type 2 diabetes mellitus without complications: Secondary | ICD-10-CM | POA: Diagnosis not present

## 2019-06-23 DIAGNOSIS — K529 Noninfective gastroenteritis and colitis, unspecified: Secondary | ICD-10-CM

## 2019-06-23 DIAGNOSIS — Z79899 Other long term (current) drug therapy: Secondary | ICD-10-CM | POA: Insufficient documentation

## 2019-06-23 DIAGNOSIS — D46C Myelodysplastic syndrome with isolated del(5q) chromosomal abnormality: Secondary | ICD-10-CM

## 2019-06-23 DIAGNOSIS — E079 Disorder of thyroid, unspecified: Secondary | ICD-10-CM | POA: Insufficient documentation

## 2019-06-23 LAB — CBC WITH DIFFERENTIAL/PLATELET
Abs Immature Granulocytes: 0.04 10*3/uL (ref 0.00–0.07)
Basophils Absolute: 0 10*3/uL (ref 0.0–0.1)
Basophils Relative: 0 %
Eosinophils Absolute: 0 10*3/uL (ref 0.0–0.5)
Eosinophils Relative: 1 %
HCT: 25.6 % — ABNORMAL LOW (ref 36.0–46.0)
Hemoglobin: 7.2 g/dL — ABNORMAL LOW (ref 12.0–15.0)
Immature Granulocytes: 2 %
Lymphocytes Relative: 29 %
Lymphs Abs: 0.6 10*3/uL — ABNORMAL LOW (ref 0.7–4.0)
MCH: 27 pg (ref 26.0–34.0)
MCHC: 28.1 g/dL — ABNORMAL LOW (ref 30.0–36.0)
MCV: 95.9 fL (ref 80.0–100.0)
Monocytes Absolute: 0.3 10*3/uL (ref 0.1–1.0)
Monocytes Relative: 13 %
Neutro Abs: 1.2 10*3/uL — ABNORMAL LOW (ref 1.7–7.7)
Neutrophils Relative %: 55 %
Platelets: 20 10*3/uL — CL (ref 150–400)
RBC: 2.67 MIL/uL — ABNORMAL LOW (ref 3.87–5.11)
RDW: 22.7 % — ABNORMAL HIGH (ref 11.5–15.5)
WBC: 2.2 10*3/uL — ABNORMAL LOW (ref 4.0–10.5)
nRBC: 0.9 % — ABNORMAL HIGH (ref 0.0–0.2)

## 2019-06-23 LAB — VITAMIN B12: Vitamin B-12: 794 pg/mL (ref 180–914)

## 2019-06-23 LAB — COMPREHENSIVE METABOLIC PANEL
ALT: 13 U/L (ref 0–44)
AST: 17 U/L (ref 15–41)
Albumin: 3.4 g/dL — ABNORMAL LOW (ref 3.5–5.0)
Alkaline Phosphatase: 113 U/L (ref 38–126)
Anion gap: 10 (ref 5–15)
BUN: 20 mg/dL (ref 8–23)
CO2: 21 mmol/L — ABNORMAL LOW (ref 22–32)
Calcium: 8.5 mg/dL — ABNORMAL LOW (ref 8.9–10.3)
Chloride: 106 mmol/L (ref 98–111)
Creatinine, Ser: 1.3 mg/dL — ABNORMAL HIGH (ref 0.44–1.00)
GFR calc Af Amer: 45 mL/min — ABNORMAL LOW (ref >60)
GFR calc non Af Amer: 39 mL/min — ABNORMAL LOW (ref >60)
Glucose, Bld: 335 mg/dL — ABNORMAL HIGH (ref 70–99)
Potassium: 4.5 mmol/L (ref 3.5–5.1)
Sodium: 137 mmol/L (ref 135–145)
Total Bilirubin: 0.7 mg/dL (ref 0.3–1.2)
Total Protein: 6.5 g/dL (ref 6.5–8.1)

## 2019-06-23 LAB — FOLATE: Folate: 7.2 ng/mL (ref >5.9)

## 2019-06-23 NOTE — Progress Notes (Signed)
Hematology/Oncology Follow up note [facility] Telephone:([phone] Fax:([phone]) [phone]   Patient Care Team: Yesenia Hess, MD as PCP - General (Internal Medicine) Yesenia Server, MD as Consulting Physician (Oncology)  REFERRING PROVIDER: Glean Hess, MD  Previous Oncologist Dr.Srujitha Hart REASON FOR VISIT Follow up for treatment of MDS  HISTORY OF PRESENTING ILLNESS:  Yesenia Hart is a  78 y.o.  female who recently moved from [place] to [place].  She used to live at [place] with husband.  Accompanied by daughter, [name] who is a respiratory therapist at [employer].  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  Patient presented to emergency room in January 2019 with complaints of intermittent rectal bleeding and increased to have a hemoglobin of 3 and platelet counts was 59,000.  Received 5 units of PRBC and was discharged home with hemoglobin of 10.  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  Cauterization was deferred due to thrombocytopenia. Aspirin was discontinued due to GI bleeding. Peripheral blood flow, SPEP negative, no obvious signs of B12 deficiency.  Ultrasound of the abdomen showed mild fatty liver.  On October 04, 2017, patient underwent bone marrow biopsy. Bone marrow core biopsy, bone marrow aspirate clot, bone marrow aspirate smears showed  single linage dysplasia.  Less than 5% bone marrow blasts and no circulating blast. 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  20 metaphases, lites were normal and 18 metaphases 5 q. deletion, it has translocation involving  chromosome of 3, 17, 16, and iso-di centric 22.  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  Patient received IV iron infusion on moving to [place].  She has not had any treatment for MDS done due to the move.  She felt tired, no weight loss.  She reports easy bruising.  Intermittent epistaxis which spontaneously resolved after applying pressure denies any hematochezia, hematemesis, hemoptysis.  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 Although patient does have 5q deletion which if isolated, is a good cytogentic group, she carried other cytogentic abnormalities which increases her IPSS-R score.   # June 2019 started on azacitidine 34m/m2 D1-5 Q28 days treatments  #12/26/2018 repeat bone marrow biopsy results were discussed with patient. Bone marrow biopsy showed normocellular, dysplastic erythroid and megakaryocyte lineages.  Note increase of blast.  Consistent with MDS. MDS FISH panel showed deletion 5 q. Negative for monosomy 5, deletion 7 q., monosomy 7, trisomy 8 deletion 20 q. Cytogenetics reveals a single cell with del(3)(p21) and del(5)(q13q33).  INTERVAL HISTORY Yesenia Mcdanielis a 78y.o. female who has above history reviewed by me today presents for discussion of bone marrow biopsy results and assessment prior to MDS treatment with azacitidine.  Last cycle azacitidine treatments was from 05/05/2019-05/12/2019 Chemotherapy has been on hold due to thrombocytopenia and neutropenia. Patient reports feeling well.  Fatigue level is at baseline.  She does have shortness of breath with exertion. + Easy bruising Denies any fever, chills, nausea, vomiting. Denies any chest pain or abdominal pain.  She has chronic colitis with intermittent diarrhea.  She is on sulfasalazine.  Follows up with gastroenterology.  She declines colonoscopy for further work-up .  Review of Systems   Constitutional: Positive for fatigue. Negative for appetite change, chills and fever.  HENT:   Negative for hearing loss and voice change.   Eyes: Negative for eye problems.  Respiratory: Negative for chest tightness and cough.   Cardiovascular: Negative for chest pain.  Gastrointestinal: Positive for diarrhea. Negative for abdominal distention, abdominal pain and blood in stool.  Endocrine: Negative for hot flashes.  Genitourinary: Negative for difficulty urinating and frequency.   Musculoskeletal: Negative for arthralgias.  Skin: Negative for itching and rash.  Neurological: Negative for extremity weakness.  Hematological: Negative for adenopathy.  Psychiatric/Behavioral: Negative for confusion.    MEDICAL HISTORY:  Past Medical History:  Diagnosis Date  . Anemia   . B12 deficiency 06/10/2018  . Clotting disorder ([place])   . Diabetes mellitus without complication ([place])   . IBS (irritable bowel syndrome)   . Iron deficiency anemia due to chronic blood loss 12/12/2017  . MDS (myelodysplastic syndrome) ([place])   . MDS (myelodysplastic syndrome) ([place])   . Thyroid disease     SURGICAL HISTORY: Past Surgical History:  Procedure Laterality Date  . APPENDECTOMY    . breast biopsy 75'    . CHOLECYSTECTOMY    . CORONARY ANGIOPLASTY WITH STENT PLACEMENT  2016   in [place]: Social History   Socioeconomic History  . Marital status: Married    Spouse name: Not on file  . Number of children: Not on file  . Years of education: Not on file  . Highest education level: Not on file  Occupational History  . Occupation: retired  Tobacco Use  . Smoking status: Former Smoker    Packs/day: 1.00    Types: Cigarettes    Quit date: 1999    Years since quitting: 21.9  . Smokeless tobacco: Never Used  Substance and Sexual Activity  . Alcohol use: Not Currently  . Drug use: Never  . Sexual activity: Yes  Other Topics Concern  . Not on file  Social History Narrative  .  Not on file   Social Determinants of Health   Financial Resource Strain:   . Difficulty of Paying Living Expenses: Not on file  Food Insecurity:   . Worried About Charity fundraiser in the Last Year: Not on file  . Ran Out of Food in the Last Year: Not on file  Transportation Needs:   . Lack of Transportation (Medical): Not on file  . Lack of Transportation (Non-Medical): Not on file  Physical Activity:   . Days of Exercise per Week: Not on file  . Minutes of Exercise per Session: Not on file  Stress:   . Feeling of Stress : Not on file  Social Connections:   . Frequency of Communication with Friends and Family: Not on file  . Frequency of Social Gatherings with Friends and Family: Not on file  . Attends Religious Services: Not on file  . Active Member of Clubs or Organizations: Not on file  . Attends Archivist Meetings: Not on file  . Marital Status: Not on file  Intimate Partner Violence:   . Fear of Current or Ex-Partner: Not on file  . Emotionally Abused: Not on file  . Physically Abused: Not on file  . Sexually Abused: Not on file    FAMILY HISTORY: Family History  Adopted: Yes  Family history unknown: Yes    ALLERGIES:  is allergic to acarbose; demerol [meperidine hcl]; meperidine; and pioglitazone.  MEDICATIONS:  Current Outpatient Medications  Medication Sig Dispense Refill  . atorvastatin (LIPITOR) 20 MG tablet TAKE 1 TABLET BY MOUTH ONCE DAILY 30 tablet 5  . azaCITIDine in lactated ringers infusion Inject 100 mg/m2 into the vein. For 5 days once a month    . furosemide (LASIX) 20 MG tablet TAKE 1 TABLET BY MOUTH ONCE DAILY 30 tablet 5  . glimepiride (AMARYL) 4 MG tablet TAKE 1 TABLET BY MOUTH ONCE DAILY 90 tablet 1  . GNP VITAMIN B-12 1000 MCG TBCR TAKE 1 TABLET BY MOUTH ONCE DAILY 90 tablet 1  . Lancets (ONETOUCH ULTRASOFT) lancets Use as instructed 100 each 12  . levothyroxine (SYNTHROID) 50 MCG tablet TAKE 1 TABLET BY MOUTH ONCE DAILY ON AN  EMPTY STOMACH. WAIT 30 MINUTES BEFORE TAKING OTHER MEDS. 90 tablet 1  . loperamide (IMODIUM) 2 MG capsule Take 2 mg by mouth as needed for diarrhea or loose stools.    Marland Kitchen NOVOLOG MIX 70/30 FLEXPEN (70-30) 100 UNIT/ML FlexPen Inject 0.08 mLs (8 Units total) into the skin 2 (two) times daily with a meal. 21 mL 5  . nystatin (MYCOSTATIN/NYSTOP) powder nystatin 100,000 unit/gram topical powder   1 app by topical route.    . ondansetron (ZOFRAN) 8 MG tablet TAKE 1 TABLET BY MOUTH TWICE DAILY AS NEEDED NAUSEA AND VOMITING 30 tablet 1  . ONETOUCH ULTRA test strip TEST TWICE DAILY 200 each 1  . pantoprazole (PROTONIX) 40 MG tablet TAKE 1 TABLET BY MOUTH ONCE DAILY 90 tablet 1  . quinapril (ACCUPRIL) 20 MG tablet TAKE 1 TABLET BY MOUTH ONCE DAILY 90 tablet 1  . sulfaSALAzine (AZULFIDINE) 500 MG tablet TAKE 1 TABLET BY MOUTH AT BEDTIME 90 tablet 1  . ULTICARE MICRO PEN NEEDLES 32G X 4 MM MISC Inject 1 each into the skin 2 (two) times daily. 100 each 12   No current facility-administered medications for this visit.     PHYSICAL EXAMINATION: ECOG PERFORMANCE STATUS: 1 - Symptomatic but completely ambulatory Vitals:   06/23/19 0859  BP: 135/75  Pulse: 93  Resp: 18  Temp: (!) 97.5 F (36.4 C)   Filed Weights   06/23/19 0859  Weight: 168 lb 11.2 oz (76.5 kg)    Physical Exam Constitutional:      General: She is not in acute distress.    Comments: Walks with a walker.   HENT:     Head: Normocephalic and atraumatic.  Eyes:     General: No scleral icterus.    Conjunctiva/sclera: Conjunctivae normal.     Pupils: Pupils are equal, round, and reactive to light.  Cardiovascular:     Rate and Rhythm: Normal rate and regular rhythm.     Heart sounds: Normal heart sounds.  Pulmonary:     Effort: Pulmonary effort is normal. No respiratory distress.     Breath sounds: Normal breath sounds. No wheezing or rales.  Chest:     Chest wall: No tenderness.  Abdominal:     General: Bowel sounds are  normal. There is no distension.     Palpations: Abdomen is soft. There is no mass.     Tenderness: There is no abdominal tenderness.  Musculoskeletal:        General: No deformity. Normal range of motion.     Cervical back: Normal range of motion and neck supple.  Lymphadenopathy:     Cervical: No cervical adenopathy.  Skin:    General: Skin is warm and dry.     Findings: No erythema or  rash.     Comments: Multiple bruises on extremities.  Neurological:     Mental Status: She is alert and oriented to person, place, and time.     Cranial Nerves: No cranial nerve deficit.     Coordination: Coordination normal.  Psychiatric:        Behavior: Behavior normal.        Thought Content: Thought content normal.      LABORATORY DATA:  I have reviewed the data as listed Lab Results  Component Value Date   WBC 2.2 (L) 06/23/2019   HGB 7.2 (L) 06/23/2019   HCT 25.6 (L) 06/23/2019   MCV 95.9 06/23/2019   PLT 20 (LL) 06/23/2019   Recent Labs    05/26/19 1423 06/09/19 0933 06/23/19 0850  NA 136 134* 137  K 4.1 4.7 4.5  CL 106 104 106  CO2 23 21* 21*  GLUCOSE 206* 311* 335*  BUN 18 17 20   CREATININE 1.17* 1.20* 1.30*  CALCIUM 8.5* 8.4* 8.5*  GFRNONAA 44* 43* 39*  GFRAA 51* 49* 45*  PROT 6.5 6.4* 6.5  ALBUMIN 3.5 3.5 3.4*  AST 15 18 17   ALT 10 12 13   ALKPHOS 107 102 113  BILITOT 0.7 0.8 0.7       ASSESSMENT & PLAN:  1. MDS (myelodysplastic syndrome) with 5q deletion ([place])   2. Thrombocytopenia ([place])   3. 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 Chemotherapy-induced neutropenia ([facility])     # MDS IPSS-R high risk, >3 cytogenetics abnormality/platelet counts less than 50,000 Revlimid not indicated due to complex cytogenetics including- 5q deletion  Labs are reviewed and discussed with patient. Patient has persistent pancytopenia.  Hold chemotherapy at this point.  #Neutropenia, improved. #Thrombocytopenia, platelet count 20,000.-?underproduction vs  consumption. Immature platelet fraction is increased.    Will obtain ultrasound abdomen.  #Anemia with hemoglobin 7.2. Had a lengthy discussion with patient.  Hold chemotherapy due to cytopenia. I recommend patient to proceed with a repeat bone marrow biopsy for further evaluation. aplastic  versus disease progression. I suggest patient to see [facility] malignant hematology for second opinion and maybe repeat a bone marrow biopsy at [facility] if needed.  Patient agrees with the plan.  Will refer to Dr. Prince Solian at [facility].  #Chronic colitis.  Recommend patient to continue follow-up with gastroenterology.  Continue sulfasalazine.  Follow-up to be determined.  Repeat blood work in 2 weeks.   Yesenia Server, MD, PhD 06/23/2019

## 2019-06-23 NOTE — Progress Notes (Signed)
Patient here for follow up. States she feels "fine, just tired."

## 2019-06-24 ENCOUNTER — Telehealth: Payer: Self-pay

## 2019-06-24 ENCOUNTER — Inpatient Hospital Stay: Payer: Medicare Other

## 2019-06-24 NOTE — Telephone Encounter (Signed)
Done...  [place] has been scheduled as requested.

## 2019-06-24 NOTE — Telephone Encounter (Signed)
-----   Message from Earlie Server, MD sent at 06/23/2019  7:40 PM EST ----- Please let patient know that I also recommend her to have an ultrasound abdomen done to rule out other etiologies of low platelet counts.  Please arrange.

## 2019-06-24 NOTE — Progress Notes (Signed)
Pt notified. Message sent to scheduling.

## 2019-06-25 ENCOUNTER — Ambulatory Visit: Payer: Medicare Other

## 2019-06-26 ENCOUNTER — Ambulatory Visit: Payer: Medicare Other

## 2019-06-26 NOTE — Telephone Encounter (Signed)
Per Dr.Yu pt to see her after labs on 07/07/19 Pt is aware of the scheduled 12/28 Van/lab/MD appts.

## 2019-06-27 ENCOUNTER — Ambulatory Visit: Payer: Medicare Other

## 2019-06-27 ENCOUNTER — Telehealth: Payer: Self-pay

## 2019-06-27 NOTE — Telephone Encounter (Signed)
Referral sent  to [facility] malignant hematology for second opinion. Pt has been scheduled for  Wednesday July 09, 2019  Arrival time: 8:45am  Clinic appt: 9:20am  Labs appt: 10/45am.

## 2019-06-28 ENCOUNTER — Other Ambulatory Visit: Payer: Self-pay | Admitting: Internal Medicine

## 2019-06-30 ENCOUNTER — Ambulatory Visit
Admission: RE | Admit: 2019-06-30 | Discharge: 2019-06-30 | Disposition: A | Discharge status: Home / Self Care | Payer: Medicare Other | Source: Ambulatory Visit | Attending: Oncology | Admitting: Oncology

## 2019-06-30 ENCOUNTER — Other Ambulatory Visit: Payer: Self-pay

## 2019-06-30 ENCOUNTER — Encounter: Payer: Self-pay | Admitting: Oncology

## 2019-06-30 ENCOUNTER — Inpatient Hospital Stay: Payer: Medicare Other

## 2019-06-30 DIAGNOSIS — T451X5A Adverse effect of antineoplastic and immunosuppressive drugs, initial encounter: Secondary | ICD-10-CM | POA: Insufficient documentation

## 2019-06-30 DIAGNOSIS — D6189 Other specified aplastic anemias and other bone marrow failure syndromes: Secondary | ICD-10-CM | POA: Diagnosis present

## 2019-06-30 DIAGNOSIS — D46C Myelodysplastic syndrome with isolated del(5q) chromosomal abnormality: Secondary | ICD-10-CM | POA: Insufficient documentation

## 2019-06-30 DIAGNOSIS — D696 Thrombocytopenia, unspecified: Secondary | ICD-10-CM | POA: Diagnosis present

## 2019-06-30 DIAGNOSIS — D701 Agranulocytosis secondary to cancer chemotherapy: Secondary | ICD-10-CM | POA: Insufficient documentation

## 2019-07-02 ENCOUNTER — Other Ambulatory Visit: Payer: Self-pay

## 2019-07-02 DIAGNOSIS — D46C Myelodysplastic syndrome with isolated del(5q) chromosomal abnormality: Secondary | ICD-10-CM

## 2019-07-07 ENCOUNTER — Inpatient Hospital Stay: Payer: Medicare Other

## 2019-07-07 ENCOUNTER — Inpatient Hospital Stay: Payer: Medicare Other | Admitting: Oncology

## 2019-07-09 DIAGNOSIS — D469 Myelodysplastic syndrome, unspecified: Secondary | ICD-10-CM | POA: Diagnosis not present

## 2019-07-10 ENCOUNTER — Other Ambulatory Visit: Payer: Medicare Other

## 2019-07-10 ENCOUNTER — Ambulatory Visit: Payer: Medicare Other | Admitting: Oncology

## 2019-07-10 ENCOUNTER — Encounter: Payer: Self-pay | Admitting: Oncology

## 2019-07-14 ENCOUNTER — Other Ambulatory Visit: Payer: Self-pay | Admitting: Internal Medicine

## 2019-07-14 ENCOUNTER — Telehealth: Payer: Self-pay | Admitting: *Deleted

## 2019-07-14 DIAGNOSIS — K219 Gastro-esophageal reflux disease without esophagitis: Secondary | ICD-10-CM

## 2019-07-14 NOTE — Telephone Encounter (Signed)
Patient called reporting that she has had rectal bleeding several times and thinks perhaps a hemorrhoid may have burst. She has not had any bleeding since yesterday and it "wasn't much yesterday". She has a follow up appointment Thursday with Dr Tasia Catchings. Her platelet count was 16 at [facility] on 12/30. Please advise

## 2019-07-14 NOTE — Telephone Encounter (Signed)
schedule message sent, already has hold tube ordered

## 2019-07-14 NOTE — Telephone Encounter (Signed)
If no additional bleeding, can wait till her appt. Please add hold tube and possible platelet transfusion.

## 2019-07-16 NOTE — Progress Notes (Signed)
Patient is doing well no complaints

## 2019-07-17 ENCOUNTER — Inpatient Hospital Stay (HOSPITAL_BASED_OUTPATIENT_CLINIC_OR_DEPARTMENT_OTHER): Payer: Medicare Other | Admitting: Oncology

## 2019-07-17 ENCOUNTER — Inpatient Hospital Stay: Payer: Medicare Other

## 2019-07-17 ENCOUNTER — Other Ambulatory Visit: Payer: Self-pay

## 2019-07-17 ENCOUNTER — Encounter: Payer: Self-pay | Admitting: Oncology

## 2019-07-17 ENCOUNTER — Inpatient Hospital Stay: Payer: Medicare Other | Attending: Oncology

## 2019-07-17 ENCOUNTER — Other Ambulatory Visit: Payer: Self-pay | Admitting: Oncology

## 2019-07-17 VITALS — BP 137/74 | HR 98 | Temp 97.8°F | Resp 18 | Wt 167.7 lb

## 2019-07-17 DIAGNOSIS — Z7189 Other specified counseling: Secondary | ICD-10-CM | POA: Diagnosis not present

## 2019-07-17 DIAGNOSIS — E039 Hypothyroidism, unspecified: Secondary | ICD-10-CM | POA: Diagnosis not present

## 2019-07-17 DIAGNOSIS — D46C Myelodysplastic syndrome with isolated del(5q) chromosomal abnormality: Secondary | ICD-10-CM

## 2019-07-17 DIAGNOSIS — D696 Thrombocytopenia, unspecified: Secondary | ICD-10-CM | POA: Diagnosis not present

## 2019-07-17 DIAGNOSIS — Z79899 Other long term (current) drug therapy: Secondary | ICD-10-CM | POA: Insufficient documentation

## 2019-07-17 DIAGNOSIS — K580 Irritable bowel syndrome with diarrhea: Secondary | ICD-10-CM | POA: Diagnosis not present

## 2019-07-17 DIAGNOSIS — Z7984 Long term (current) use of oral hypoglycemic drugs: Secondary | ICD-10-CM | POA: Diagnosis not present

## 2019-07-17 DIAGNOSIS — D649 Anemia, unspecified: Secondary | ICD-10-CM

## 2019-07-17 DIAGNOSIS — D6189 Other specified aplastic anemias and other bone marrow failure syndromes: Secondary | ICD-10-CM | POA: Diagnosis not present

## 2019-07-17 DIAGNOSIS — D469 Myelodysplastic syndrome, unspecified: Secondary | ICD-10-CM | POA: Diagnosis not present

## 2019-07-17 DIAGNOSIS — Z87891 Personal history of nicotine dependence: Secondary | ICD-10-CM | POA: Insufficient documentation

## 2019-07-17 DIAGNOSIS — E119 Type 2 diabetes mellitus without complications: Secondary | ICD-10-CM | POA: Diagnosis not present

## 2019-07-17 LAB — CBC WITH DIFFERENTIAL/PLATELET
Abs Immature Granulocytes: 0.05 10*3/uL (ref 0.00–0.07)
Basophils Absolute: 0 10*3/uL (ref 0.0–0.1)
Basophils Relative: 0 %
Eosinophils Absolute: 0 10*3/uL (ref 0.0–0.5)
Eosinophils Relative: 0 %
HCT: 24.4 % — ABNORMAL LOW (ref 36.0–46.0)
Hemoglobin: 6.8 g/dL — ABNORMAL LOW (ref 12.0–15.0)
Immature Granulocytes: 2 %
Lymphocytes Relative: 29 %
Lymphs Abs: 0.7 10*3/uL (ref 0.7–4.0)
MCH: 26.3 pg (ref 26.0–34.0)
MCHC: 27.9 g/dL — ABNORMAL LOW (ref 30.0–36.0)
MCV: 94.2 fL (ref 80.0–100.0)
Monocytes Absolute: 0.3 10*3/uL (ref 0.1–1.0)
Monocytes Relative: 12 %
Neutro Abs: 1.4 10*3/uL — ABNORMAL LOW (ref 1.7–7.7)
Neutrophils Relative %: 57 %
Platelets: 16 10*3/uL — CL (ref 150–400)
RBC: 2.59 MIL/uL — ABNORMAL LOW (ref 3.87–5.11)
RDW: 22.9 % — ABNORMAL HIGH (ref 11.5–15.5)
Smear Review: DECREASED
WBC: 2.4 10*3/uL — ABNORMAL LOW (ref 4.0–10.5)
nRBC: 2.1 % — ABNORMAL HIGH (ref 0.0–0.2)

## 2019-07-17 LAB — PREPARE RBC (CROSSMATCH)

## 2019-07-17 LAB — SAMPLE TO BLOOD BANK

## 2019-07-17 MED ORDER — DIPHENHYDRAMINE HCL 25 MG PO CAPS
25.0000 mg | ORAL_CAPSULE | Freq: Once | ORAL | Status: AC
  Administered 2019-07-17: 25 mg via ORAL
  Filled 2019-07-17: qty 1

## 2019-07-17 MED ORDER — SODIUM CHLORIDE 0.9% IV SOLUTION
250.0000 mL | Freq: Once | INTRAVENOUS | Status: AC
  Administered 2019-07-17: 50 mL via INTRAVENOUS
  Filled 2019-07-17: qty 250

## 2019-07-17 MED ORDER — ACETAMINOPHEN 325 MG PO TABS
650.0000 mg | ORAL_TABLET | Freq: Once | ORAL | Status: AC
  Administered 2019-07-17: 650 mg via ORAL
  Filled 2019-07-17: qty 2

## 2019-07-17 NOTE — Progress Notes (Signed)
Patient here for follow up. Complains of feeling weak and tired. Patient reports that she is no longer having rectal bleeding.

## 2019-07-18 ENCOUNTER — Encounter: Payer: Self-pay | Admitting: Oncology

## 2019-07-18 NOTE — Progress Notes (Signed)
Hematology/Oncology Follow up note [facility] [phone] [phone]   Patient Care Team: Yesenia Hess, MD as PCP - General (Internal Medicine) Yesenia Server, MD as Consulting Physician (Oncology)  REFERRING PROVIDER: Glean Hess, MD  Previous Oncologist Dr.Srujitha Hart REASON FOR VISIT Follow up for treatment of MDS  HISTORY OF PRESENTING ILLNESS:  Yesenia Hart is a  79 y.o.  female who recently moved from [place] to [place].  She used to live at [place] with husband.  Accompanied by daughter, Yesenia Hart who is a respiratory therapist at [employer].  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  Patient presented to emergency room in January 2019 with complaints of intermittent rectal bleeding and increased to have a hemoglobin of 3 and platelet counts was 59,000.  Received 5 units of PRBC and was discharged home with hemoglobin of 10.  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  Cauterization was deferred due to thrombocytopenia. Aspirin was discontinued due to GI bleeding. Peripheral blood flow, SPEP negative, no obvious signs of B12 deficiency.  Ultrasound of the abdomen showed mild fatty liver.  On October 04, 2017, patient underwent bone marrow biopsy. Bone marrow core biopsy, bone marrow aspirate clot, bone marrow aspirate smears showed  single linage dysplasia.  Less than 5% bone marrow blasts and no circulating blast. 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  20 metaphases, lites were normal and 18 metaphases 5 q. deletion, it has translocation involving  chromosome of 3, 17, 16, and iso-di centric 22.  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  Patient received IV iron infusion on moving to [place].  She has not had any treatment for MDS done due to the move.  She felt tired, no weight loss.  She reports easy bruising.  Intermittent epistaxis which spontaneously resolved after applying pressure denies any hematochezia, hematemesis, hemoptysis.  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 Although patient does have 5q deletion which if isolated, is a good cytogentic group, she carried other cytogentic abnormalities which increases her IPSS-R score.   # June 2019 started on azacitidine 34m/m2 D1-5 Q28 days treatments  #12/26/2018 repeat bone marrow biopsy results were discussed with patient. Bone marrow biopsy showed normocellular, dysplastic erythroid and megakaryocyte lineages.  Note increase of blast.  Consistent with MDS. MDS FISH panel showed deletion 5 q. Negative for monosomy 5, deletion 7 q., monosomy 7, trisomy 8 deletion 20 q. Cytogenetics reveals a single cell with del(3)(p21) and del(5)(q13q33).  # Last cycle azacitidine treatments was from 05/05/2019-05/12/2019 Azacitidine was stopped due to progressive cytopenia.  # She has chronic colitis with intermittent diarrhea.  She is on sulfasalazine.  Follows up with gastroenterology.  She declines colonoscopy for further work-up INTERVAL HISTORY GKaisyn Reinholdis a 79y.o. female who has above history reviewed by me today presents for follow-up of MDS [facility]  Was recommended to have myeloid mutation panel and final recommendation pending on the myeloma panel. [facility] a [facility] 16,[facility]. Patient experienced several episode of  mild rectal bleeding.  She saw small amount of blood  on her stool.  No additional episodes since 07/13/2019.Marland Kitchen  She recalls that her stool was quite hard at that time and it may have burst hemorrhoid..  Today she reports feeling at baseline.  Slightly more fatigued.  Easy bruising.  No other bleeding events.  No fever, chills, nausea or vomiting.  No chest pain or abdominal pain.  .  Review of Systems  Constitutional: Positive for fatigue. Negative for appetite change, chills and fever.  HENT:   Negative for hearing loss and voice change.   Eyes: Negative for eye problems.  Respiratory: Negative for chest tightness and cough.   Cardiovascular: Negative for chest pain.  Gastrointestinal: Negative for abdominal distention and abdominal pain.       Small amount of blood in stool.  Resolved.  Endocrine: Negative for hot flashes.  Genitourinary: Negative for difficulty urinating and frequency.   Musculoskeletal: Negative for arthralgias.  Skin: Negative for itching and rash.  Neurological: Negative for extremity weakness.  Hematological: Negative for adenopathy.  Psychiatric/Behavioral: Negative for confusion.    MEDICAL HISTORY:  Past Medical History:  Diagnosis Date  . Anemia   . B12 deficiency 06/10/2018  . Clotting disorder ([place])   . Diabetes mellitus without complication ([place])   . IBS (irritable bowel syndrome)   . Iron deficiency anemia due to chronic blood loss 12/12/2017  . MDS (myelodysplastic syndrome) ([place])   . MDS (myelodysplastic syndrome) ([place])   . Thyroid disease     SURGICAL HISTORY: Past Surgical History:  Procedure Laterality Date  . APPENDECTOMY    . breast biopsy 35'    . CHOLECYSTECTOMY    . CORONARY ANGIOPLASTY WITH STENT PLACEMENT  2016   in [place]: Social History   Socioeconomic History  . Marital status: Married    Spouse name: Not on file  . Number of children: Not on file  . Years of education: Not on file  . Highest education level: Not on file  Occupational History  .  Occupation: retired  Tobacco Use  . Smoking status: Former Smoker    Packs/day: 1.00    Types: Cigarettes    Quit date: 1999    Years since quitting: 22.0  . Smokeless tobacco: Never Used  Substance and Sexual Activity  . Alcohol use: Not Currently  . Drug use: Never  . Sexual activity: Yes  Other Topics Concern  . Not on file  Social History Narrative  . Not on file   Social Determinants of Health   Financial Resource Strain:   . Difficulty of Paying Living Expenses: Not on file  Food Insecurity:   . Worried About Charity fundraiser in the Last Year: Not on file  . Ran Out of Food in the Last Year: Not on file  Transportation Needs:   . Lack of Transportation (Medical): Not on file  . Lack of Transportation (Non-Medical): Not on file  Physical Activity:   . Days of Exercise per Week: Not on file  . Minutes of Exercise per Session: Not on file  Stress:   . Feeling of Stress : Not on file  Social Connections:   . Frequency of Communication with Friends and Family: Not on file  . Frequency of Social Gatherings with Friends and Family: Not on file  . Attends Religious Services: Not on file  . Active Member of Clubs or Organizations: Not on file  . Attends Club or  Organization Meetings: Not on file  . Marital Status: Not on file  Intimate Partner Violence:   . Fear of Current or Ex-Partner: Not on file  . Emotionally Abused: Not on file  . Physically Abused: Not on file  . Sexually Abused: Not on file    FAMILY HISTORY: Family History  Adopted: Yes  Family history unknown: Yes    ALLERGIES:  is allergic to acarbose; demerol [meperidine hcl]; meperidine; and pioglitazone.  MEDICATIONS:  Current Outpatient Medications  Medication Sig Dispense Refill  . atorvastatin (LIPITOR) 20 MG tablet TAKE 1 TABLET BY MOUTH ONCE DAILY 30 tablet 5  . azaCITIDine in lactated ringers infusion Inject 100 mg/m2 into the vein. For 5 days once a month    . furosemide (LASIX) 20 MG  tablet TAKE 1 TABLET BY MOUTH ONCE DAILY 30 tablet 5  . glimepiride (AMARYL) 4 MG tablet TAKE 1 TABLET BY MOUTH ONCE DAILY 90 tablet 1  . GNP VITAMIN B-12 1000 MCG TBCR TAKE 1 TABLET BY MOUTH ONCE DAILY 90 tablet 1  . Lancets (ONETOUCH ULTRASOFT) lancets USE AS DIRECTED. 100 each 12  . levothyroxine (SYNTHROID) 50 MCG tablet TAKE 1 TABLET BY MOUTH ONCE DAILY ON AN EMPTY STOMACH. WAIT 30 MINUTES BEFORE TAKING OTHER MEDS. 90 tablet 1  . loperamide (IMODIUM) 2 MG capsule Take 2 mg by mouth as needed for diarrhea or loose stools.    Marland Kitchen NOVOLOG MIX 70/30 FLEXPEN (70-30) 100 UNIT/ML FlexPen Inject 0.08 mLs (8 Units total) into the skin 2 (two) times daily with a meal. 21 mL 5  . nystatin (MYCOSTATIN/NYSTOP) powder nystatin 100,000 unit/gram topical powder   1 app by topical route.    . ondansetron (ZOFRAN) 8 MG tablet TAKE 1 TABLET BY MOUTH TWICE DAILY AS NEEDED NAUSEA AND VOMITING 30 tablet 1  . ONETOUCH ULTRA test strip TEST TWICE DAILY 200 each 1  . pantoprazole (PROTONIX) 40 MG tablet TAKE 1 TABLET BY MOUTH ONCE DAILY 90 tablet 1  . quinapril (ACCUPRIL) 20 MG tablet TAKE 1 TABLET BY MOUTH ONCE DAILY 90 tablet 1  . sulfaSALAzine (AZULFIDINE) 500 MG tablet TAKE 1 TABLET BY MOUTH AT BEDTIME 90 tablet 1  . ULTICARE MICRO PEN NEEDLES 32G X 4 MM MISC Inject 1 each into the skin 2 (two) times daily. 100 each 12   No current facility-administered medications for this visit.     PHYSICAL EXAMINATION: ECOG PERFORMANCE STATUS: 1 - Symptomatic but completely ambulatory Vitals:   07/16/19 1040  BP: 137/74  Pulse: 98  Resp: 18  Temp: 97.8 F (36.6 C)  SpO2: 98%   Filed Weights   07/16/19 1040  Weight: 167 lb 11.2 oz (76.1 kg)    Physical Exam Constitutional:      General: She is not in acute distress.    Comments: Walks with a walker.   HENT:     Head: Normocephalic and atraumatic.  Eyes:     General: No scleral icterus.    Conjunctiva/sclera: Conjunctivae normal.     Pupils: Pupils are  equal, round, and reactive to light.  Cardiovascular:     Rate and Rhythm: Normal rate and regular rhythm.     Heart sounds: Normal heart sounds.  Pulmonary:     Effort: Pulmonary effort is normal. No respiratory distress.     Breath sounds: Normal breath sounds. No wheezing or rales.  Chest:     Chest wall: No tenderness.  Abdominal:     General: Bowel sounds are normal. There  is no distension.     Palpations: Abdomen is soft. There is no mass.     Tenderness: There is no abdominal tenderness.  Musculoskeletal:        General: No deformity. Normal range of motion.     Cervical back: Normal range of motion and neck supple.  Lymphadenopathy:     Cervical: No cervical adenopathy.  Skin:    General: Skin is warm and dry.     Findings: No erythema or rash.     Comments: Multiple bruises on extremities.  Neurological:     Mental Status: She is alert and oriented to person, place, and time.     Cranial Nerves: No cranial nerve deficit.     Coordination: Coordination normal.  Psychiatric:        Mood and Affect: Mood normal.      LABORATORY DATA:  I have reviewed the data as listed Lab Results  Component Value Date   WBC 2.4 (L) 07/17/2019   HGB 6.8 (L) 07/17/2019   HCT 24.4 (L) 07/17/2019   MCV 94.2 07/17/2019   PLT 16 (LL) 07/17/2019   Recent Labs    05/26/19 1423 06/09/19 0933 06/23/19 0850  NA 136 134* 137  K 4.1 4.7 4.5  CL 106 104 106  CO2 23 21* 21*  GLUCOSE 206* 311* 335*  BUN 18 17 20   CREATININE 1.17* 1.20* 1.30*  CALCIUM 8.5* 8.4* 8.5*  GFRNONAA 44* 43* 39*  GFRAA 51* 49* 45*  PROT 6.5 6.4* 6.5  ALBUMIN 3.5 3.5 3.4*  AST 15 18 17   ALT 10 12 13   ALKPHOS 107 102 113  BILITOT 0.7 0.8 0.7       ASSESSMENT & PLAN:  1. MDS (myelodysplastic syndrome) with 5q deletion ([place])   2. Thrombocytopenia ([place])   3. 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 Goals of care, counseling/discussion     # MDS IPSS-R high risk, >3 cytogenetics  abnormality/platelet counts less than 50,000 Labs are reviewed and discussed with patient. Continue supportive care. Patient has become transfusion dependent. Awaiting myeloma panel before proceeding with second line treatment, i.e. Revlimid.  Expresses her wishes of not willing to proceed with clinical trials. Consider bone marrow biopsy prior to second line treatment. Refer to palliative for establish care. Goal of care, palliative.  Discussed with patient. Symptomatic anemia, hemoglobin 6.8, proceed with 1 unit of irradiated PRBC transfusion today. Thrombocytopenia, platelets 16000, rectal bleeding has resolved. Repeat CBC on 07/20/2018 Transfuse irradiated blood products if hemoglobin less than 7, or platelet less than 10,000, or 15,000/20,000 if acute bleeding.  #Rectal bleeding, currently resolved.  Discussed with patient that she may use stool softener if she has hard stools.  If becomes to be a recurrent issue for her, I would consider Amicar to reduce possible bleeding from GI.  I called patient's daughter and answered her questions. Patient will repeat blood work on 07/20/2018 Repeat labs on 07/24/2018 for possible blood work platelet transfusion   Yesenia Server, MD, PhD 07/18/2019

## 2019-07-21 ENCOUNTER — Inpatient Hospital Stay: Payer: Medicare Other

## 2019-07-21 ENCOUNTER — Other Ambulatory Visit: Payer: Self-pay

## 2019-07-21 ENCOUNTER — Inpatient Hospital Stay: Payer: Medicare Other | Attending: Oncology

## 2019-07-21 DIAGNOSIS — Z7984 Long term (current) use of oral hypoglycemic drugs: Secondary | ICD-10-CM | POA: Insufficient documentation

## 2019-07-21 DIAGNOSIS — D46C Myelodysplastic syndrome with isolated del(5q) chromosomal abnormality: Secondary | ICD-10-CM

## 2019-07-21 DIAGNOSIS — Z87891 Personal history of nicotine dependence: Secondary | ICD-10-CM | POA: Diagnosis not present

## 2019-07-21 DIAGNOSIS — D469 Myelodysplastic syndrome, unspecified: Secondary | ICD-10-CM | POA: Diagnosis not present

## 2019-07-21 DIAGNOSIS — Z794 Long term (current) use of insulin: Secondary | ICD-10-CM | POA: Insufficient documentation

## 2019-07-21 DIAGNOSIS — E119 Type 2 diabetes mellitus without complications: Secondary | ICD-10-CM | POA: Diagnosis not present

## 2019-07-21 DIAGNOSIS — Z79899 Other long term (current) drug therapy: Secondary | ICD-10-CM | POA: Diagnosis not present

## 2019-07-21 DIAGNOSIS — D709 Neutropenia, unspecified: Secondary | ICD-10-CM | POA: Insufficient documentation

## 2019-07-21 LAB — CBC WITH DIFFERENTIAL/PLATELET
Abs Immature Granulocytes: 0.06 10*3/uL (ref 0.00–0.07)
Basophils Absolute: 0 10*3/uL (ref 0.0–0.1)
Basophils Relative: 0 %
Eosinophils Absolute: 0 10*3/uL (ref 0.0–0.5)
Eosinophils Relative: 1 %
HCT: 29.3 % — ABNORMAL LOW (ref 36.0–46.0)
Hemoglobin: 8.4 g/dL — ABNORMAL LOW (ref 12.0–15.0)
Immature Granulocytes: 3 %
Lymphocytes Relative: 36 %
Lymphs Abs: 0.8 10*3/uL (ref 0.7–4.0)
MCH: 26.8 pg (ref 26.0–34.0)
MCHC: 28.7 g/dL — ABNORMAL LOW (ref 30.0–36.0)
MCV: 93.3 fL (ref 80.0–100.0)
Monocytes Absolute: 0.3 10*3/uL (ref 0.1–1.0)
Monocytes Relative: 13 %
Neutro Abs: 1.1 10*3/uL — ABNORMAL LOW (ref 1.7–7.7)
Neutrophils Relative %: 47 %
Platelets: 15 10*3/uL — CL (ref 150–400)
RBC: 3.14 MIL/uL — ABNORMAL LOW (ref 3.87–5.11)
RDW: 20.8 % — ABNORMAL HIGH (ref 11.5–15.5)
Smear Review: NORMAL
WBC: 2.3 10*3/uL — ABNORMAL LOW (ref 4.0–10.5)
nRBC: 3 % — ABNORMAL HIGH (ref 0.0–0.2)

## 2019-07-21 LAB — SAMPLE TO BLOOD BANK

## 2019-07-23 LAB — TYPE AND SCREEN
ABO/RH(D): B POS
Antibody Screen: NEGATIVE
Unit division: 0

## 2019-07-23 LAB — BPAM RBC
Blood Product Expiration Date: 202101162359
ISSUE DATE / TIME: 202101071143
Unit Type and Rh: 5100

## 2019-07-24 ENCOUNTER — Encounter: Payer: Self-pay | Admitting: Oncology

## 2019-07-25 ENCOUNTER — Inpatient Hospital Stay: Payer: Medicare Other

## 2019-07-25 ENCOUNTER — Inpatient Hospital Stay (HOSPITAL_BASED_OUTPATIENT_CLINIC_OR_DEPARTMENT_OTHER): Payer: Medicare Other | Admitting: Hospice and Palliative Medicine

## 2019-07-25 ENCOUNTER — Other Ambulatory Visit: Payer: Self-pay

## 2019-07-25 ENCOUNTER — Other Ambulatory Visit: Payer: Self-pay | Admitting: Oncology

## 2019-07-25 DIAGNOSIS — D696 Thrombocytopenia, unspecified: Secondary | ICD-10-CM

## 2019-07-25 DIAGNOSIS — D46C Myelodysplastic syndrome with isolated del(5q) chromosomal abnormality: Secondary | ICD-10-CM

## 2019-07-25 DIAGNOSIS — K580 Irritable bowel syndrome with diarrhea: Secondary | ICD-10-CM | POA: Diagnosis not present

## 2019-07-25 DIAGNOSIS — E039 Hypothyroidism, unspecified: Secondary | ICD-10-CM | POA: Diagnosis not present

## 2019-07-25 DIAGNOSIS — E119 Type 2 diabetes mellitus without complications: Secondary | ICD-10-CM | POA: Diagnosis not present

## 2019-07-25 DIAGNOSIS — Z87891 Personal history of nicotine dependence: Secondary | ICD-10-CM | POA: Diagnosis not present

## 2019-07-25 DIAGNOSIS — Z515 Encounter for palliative care: Secondary | ICD-10-CM

## 2019-07-25 DIAGNOSIS — Z7189 Other specified counseling: Secondary | ICD-10-CM

## 2019-07-25 LAB — CBC WITH DIFFERENTIAL/PLATELET
Abs Immature Granulocytes: 0.08 10*3/uL — ABNORMAL HIGH (ref 0.00–0.07)
Basophils Absolute: 0 10*3/uL (ref 0.0–0.1)
Basophils Relative: 1 %
Eosinophils Absolute: 0 10*3/uL (ref 0.0–0.5)
Eosinophils Relative: 2 %
HCT: 26.2 % — ABNORMAL LOW (ref 36.0–46.0)
Hemoglobin: 7.5 g/dL — ABNORMAL LOW (ref 12.0–15.0)
Immature Granulocytes: 5 %
Lymphocytes Relative: 34 %
Lymphs Abs: 0.6 10*3/uL — ABNORMAL LOW (ref 0.7–4.0)
MCH: 27.1 pg (ref 26.0–34.0)
MCHC: 28.6 g/dL — ABNORMAL LOW (ref 30.0–36.0)
MCV: 94.6 fL (ref 80.0–100.0)
Monocytes Absolute: 0.2 10*3/uL (ref 0.1–1.0)
Monocytes Relative: 12 %
Neutro Abs: 0.9 10*3/uL — ABNORMAL LOW (ref 1.7–7.7)
Neutrophils Relative %: 46 %
Platelets: 13 10*3/uL — CL (ref 150–400)
RBC: 2.77 MIL/uL — ABNORMAL LOW (ref 3.87–5.11)
RDW: 20.7 % — ABNORMAL HIGH (ref 11.5–15.5)
Smear Review: DECREASED
WBC: 1.8 10*3/uL — ABNORMAL LOW (ref 4.0–10.5)
nRBC: 1.7 % — ABNORMAL HIGH (ref 0.0–0.2)

## 2019-07-25 LAB — SAMPLE TO BLOOD BANK

## 2019-07-25 MED ORDER — AMINOCAPROIC ACID 500 MG PO TABS: 500.0000 mg | ORAL_TABLET | Freq: Four times a day (QID) | ORAL | 0 refills | Status: AC

## 2019-07-25 MED ORDER — DIPHENHYDRAMINE HCL 25 MG PO CAPS
25.0000 mg | ORAL_CAPSULE | Freq: Once | ORAL | Status: AC
  Administered 2019-07-25: 25 mg via ORAL
  Filled 2019-07-25: qty 1

## 2019-07-25 MED ORDER — ACETAMINOPHEN 325 MG PO TABS
650.0000 mg | ORAL_TABLET | Freq: Once | ORAL | Status: AC
  Administered 2019-07-25: 650 mg via ORAL
  Filled 2019-07-25: qty 2

## 2019-07-25 MED ORDER — SODIUM CHLORIDE 0.9% IV SOLUTION
250.0000 mL | Freq: Once | INTRAVENOUS | Status: AC
  Administered 2019-07-25: 50 mL via INTRAVENOUS
  Filled 2019-07-25: qty 250

## 2019-07-25 NOTE — Progress Notes (Signed)
[place]  [phone] [phone]   Name: Carlos Quackenbush Date: 07/25/2019 MRN: [id number]  DOB: November 04, 1940  Patient Care Team: Glean Hess, MD as PCP - General (Internal Medicine) Earlie Server, MD as Consulting Physician (Oncology)    REASON FOR CONSULTATION: Yesenia Hart is a 79 y.o. female with multiple medical problems including transfusion dependent MDS, symptomatic anemia, thrombocytopenia, CAD, diverticular disease with history of rectal bleeding, diabetes, and hypothyroidism.  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  She is referred to palliative care to help address goals and manage ongoing symptoms.  SOCIAL HISTORY:     reports that she quit smoking about 22 years ago. Her smoking use included cigarettes. She smoked 1.00 pack per day. She has never used smokeless tobacco. She reports previous alcohol use. She reports that she does not use drugs.   Patient is married and lives there with her husband at [facility].  She has a son and daughter in [place] and another daughter in the [place] area.  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  ADVANCE DIRECTIVES:  Not on file  CODE STATUS:   PAST MEDICAL HISTORY: Past Medical History:  Diagnosis Date  . Anemia   . B12 deficiency 06/10/2018  . Clotting disorder ([place])   . Diabetes mellitus without complication ([place])   . IBS (irritable bowel syndrome)   . Iron deficiency anemia due to chronic blood loss 12/12/2017  . MDS (myelodysplastic syndrome) ([place])   . MDS (myelodysplastic syndrome) ([place])   . Thyroid disease     PAST SURGICAL HISTORY:  Past Surgical History:  Procedure Laterality Date  . APPENDECTOMY    . breast biopsy 18'    . CHOLECYSTECTOMY    . CORONARY ANGIOPLASTY WITH STENT PLACEMENT  2016   in [place]    HEMATOLOGY/ONCOLOGY HISTORY:  Oncology History Overview Note  #  May 2019- [BMBx in [place]]MDS-MDS IPSS-R high risk, mainly due to more than 3 cytogenetics abnormality; On  Vidaza [June 2019]  #    MDS (myelodysplastic syndrome) with 5q deletion ([place])  11/30/2017 Initial Diagnosis   MDS (myelodysplastic syndrome) with 5q deletion ([place])   12/17/2017 -  Chemotherapy   The patient had pegfilgrastim-cbqv (UDENYCA) injection 6 mg, 6 mg, Subcutaneous, Once, 2 of 2 cycles Administration: 6 mg (01/18/2018), 6 mg (02/18/2018) azaCITIDine (VIDAZA) chemo injection 132.5 mg, 75 mg/m2 = 132.5 mg, Subcutaneous,  Once, 16 of 18 cycles Dose modification: 32 mg/m2 (original dose 75 mg/m2, Cycle 17, Reason: Dose not tolerated), 35 mg/m2 (original dose 75 mg/m2, Cycle 17, Reason: Dose not tolerated), 37.5 mg/m2 (original dose 75 mg/m2, Cycle 17, Reason: Catheter Related Infection) Administration: 132.5 mg (12/17/2017), 132.5 mg (12/18/2017), 132.5 mg (12/19/2017), 132.5 mg (12/20/2017), 132.5 mg (12/21/2017), 132.5 mg (01/14/2018), 132.5 mg (01/15/2018), 132.5 mg (04/10/2018), 132.5 mg (05/06/2018), 132.5 mg (05/07/2018), 132.5 mg (05/08/2018), 132.5 mg (05/09/2018), 132.5 mg (05/10/2018), 132.5 mg (02/11/2018), 132.5 mg (03/12/2018), 132.5 mg (01/16/2018), 132.5 mg (01/17/2018), 132.5 mg (02/12/2018), 132.5 mg (02/13/2018), 132.5 mg (02/14/2018), 132.5 mg (02/15/2018), 132.5 mg (03/14/2018), 132.5 mg (03/15/2018), 132.5 mg (03/18/2018), 132.5 mg (04/08/2018), 132.5 mg (04/11/2018), 132.5 mg (04/12/2018), 132.5 mg (06/10/2018), 132.5 mg (06/11/2018), 132.5 mg (06/12/2018), 132.5 mg (06/13/2018), 132.5 mg (06/14/2018), 132.5 mg (07/15/2018), 132.5 mg (07/16/2018), 132.5 mg (07/17/2018), 132.5 mg (07/18/2018), 132.5 mg (07/19/2018), 132.5 mg (09/16/2018), 132.5 mg (09/17/2018), 132.5 mg (09/18/2018), 132.5 mg (09/19/2018), 132.5 mg (09/20/2018), 132.5 mg (08/12/2018), 132.5  mg (08/13/2018), 132.5 mg (08/14/2018), 132.5 mg (08/15/2018), 132.5 mg (08/16/2018), 132.5 mg (11/18/2018), 132.5 mg (11/19/2018), 132.5 mg (11/20/2018), 132.5 mg (11/21/2018), 132.5  mg (11/22/2018), 132.5 mg (10/21/2018), 132.5 mg (10/22/2018), 132.5 mg (10/23/2018), 132.5 mg (10/24/2018), 132.5 mg (10/25/2018), 132.5 mg (01/06/2019), 132.5 mg (01/07/2019), 132.5 mg (01/08/2019), 132.5 mg (01/09/2019), 132.5 mg (01/13/2019), 132.5 mg (02/17/2019), 132.5 mg (02/18/2019), 132.5 mg (02/19/2019), 132.5 mg (02/20/2019), 132.5 mg (02/21/2019), 132.5 mg (03/24/2019), 132.5 mg (03/25/2019), 132.5 mg (03/26/2019), 132.5 mg (03/27/2019), 132.5 mg (03/28/2019), 132.5 mg (05/05/2019), 132.5 mg (05/06/2019), 132.5 mg (05/07/2019), 132.5 mg (05/08/2019), 132.5 mg (05/09/2019)  for chemotherapy treatment.    MDS (myelodysplastic syndrome), high grade (HCC)    ALLERGIES:  is allergic to acarbose; demerol [meperidine hcl]; meperidine; and pioglitazone.  MEDICATIONS:  Current Outpatient Medications  Medication Sig Dispense Refill  . aminocaproic acid (AMICAR) 500 MG tablet Take 1 tablet (500 mg total) by mouth every 6 (six) hours. 120 tablet 0  . atorvastatin (LIPITOR) 20 MG tablet TAKE 1 TABLET BY MOUTH ONCE DAILY 30 tablet 5  . azaCITIDine in lactated ringers infusion Inject 100 mg/m2 into the vein. For 5 days once a month    . furosemide (LASIX) 20 MG tablet TAKE 1 TABLET BY MOUTH ONCE DAILY 30 tablet 5  . glimepiride (AMARYL) 4 MG tablet TAKE 1 TABLET BY MOUTH ONCE DAILY 90 tablet 1  . GNP VITAMIN B-12 1000 MCG TBCR TAKE 1 TABLET BY MOUTH ONCE DAILY 90 tablet 1  . Lancets (ONETOUCH ULTRASOFT) lancets USE AS DIRECTED. 100 each 12  . levothyroxine (SYNTHROID) 50 MCG tablet TAKE 1 TABLET BY MOUTH ONCE DAILY ON AN EMPTY STOMACH. WAIT 30 MINUTES BEFORE TAKING OTHER MEDS. 90 tablet 1  . loperamide (IMODIUM) 2 MG capsule Take 2 mg by mouth as needed for diarrhea or loose stools.    Marland Kitchen NOVOLOG MIX 70/30 FLEXPEN (70-30) 100 UNIT/ML FlexPen Inject 0.08 mLs (8 Units total) into the skin 2 (two) times daily with a meal. 21 mL 5  . nystatin (MYCOSTATIN/NYSTOP) powder nystatin 100,000 unit/gram topical powder   1 app by  topical route.    . ondansetron (ZOFRAN) 8 MG tablet TAKE 1 TABLET BY MOUTH TWICE DAILY AS NEEDED NAUSEA AND VOMITING 30 tablet 1  . ONETOUCH ULTRA test strip TEST TWICE DAILY 200 each 1  . pantoprazole (PROTONIX) 40 MG tablet TAKE 1 TABLET BY MOUTH ONCE DAILY 90 tablet 1  . quinapril (ACCUPRIL) 20 MG tablet TAKE 1 TABLET BY MOUTH ONCE DAILY 90 tablet 1  . sulfaSALAzine (AZULFIDINE) 500 MG tablet TAKE 1 TABLET BY MOUTH AT BEDTIME 90 tablet 1  . ULTICARE MICRO PEN NEEDLES 32G X 4 MM MISC Inject 1 each into the skin 2 (two) times daily. 100 each 12   No current facility-administered medications for this visit.    VITAL SIGNS: There were no vitals taken for this visit. There were no vitals filed for this visit.  Estimated body mass index is 28.79 kg/m as calculated from the following:   Height as of 04/18/19: 5' 4"  (1.626 m).   Weight as of 07/16/19: 167 lb 11.2 oz (76.1 kg).  LABS: CBC:    Component Value Date/Time   WBC 1.8 (L) 07/25/2019 0814   HGB 7.5 (L) 07/25/2019 0814   HCT 26.2 (L) 07/25/2019 0814   PLT 13 (LL) 07/25/2019 0814   MCV 94.6 07/25/2019 0814   NEUTROABS 0.9 (L) 07/25/2019 0814   LYMPHSABS 0.6 (L) 07/25/2019 0814   MONOABS 0.2 07/25/2019  0814   EOSABS 0.0 07/25/2019 0814   BASOSABS 0.0 07/25/2019 0814   Comprehensive Metabolic Panel:    Component Value Date/Time   NA 137 06/23/2019 0850   K 4.5 06/23/2019 0850   CL 106 06/23/2019 0850   CO2 21 (L) 06/23/2019 0850   BUN 20 06/23/2019 0850   CREATININE 1.30 (H) 06/23/2019 0850   GLUCOSE 335 (H) 06/23/2019 0850   CALCIUM 8.5 (L) 06/23/2019 0850   AST 17 06/23/2019 0850   ALT 13 06/23/2019 0850   ALKPHOS 113 06/23/2019 0850   BILITOT 0.7 06/23/2019 0850   PROT 6.5 06/23/2019 0850   ALBUMIN 3.4 (L) 06/23/2019 0850    RADIOGRAPHIC STUDIES: US Abdomen Complete  Result Date: 06/30/2019 CLINICAL DATA:  Myelodysplastic syndrome with 5q deletion, thrombocytopenia, anemia, former smoker EXAM: ABDOMEN  ULTRASOUND COMPLETE COMPARISON:  None FINDINGS: Gallbladder: Surgically absent Common bile duct: Diameter: 7 mm, normal post cholecystectomy Liver: Suboptimal visualization of intrahepatic detail due to increased echogenicity with sound attenuation as well as body habitus. No gross hepatic mass is visualized on limited assessment. Portal vein is patent on color Doppler imaging with normal direction of blood flow towards the liver. IVC: Normal appearance Pancreas: Normal appearance Spleen: 11.2 cm length with calculated volume of 340 mL. No focal abnormality. Right Kidney: Length: 8.3 cm. Cortical thinning. Increased cortical echogenicity. No mass or hydronephrosis. Left Kidney: Length: 9.0 cm. Cortical thinning. Increased cortical echogenicity. No mass or hydronephrosis. Abdominal aorta: Normal caliber Other findings: No free fluid IMPRESSION: Post cholecystectomy. Inadequate visualization of intrahepatic detail due to sound attenuation; if better intrahepatic visualization is required recommend either MR or CT assessment. BILATERAL renal cortical atrophy and medical renal disease changes. Electronically Signed   By: Lavonia Dana M.D.   On: 06/30/2019 12:36    PERFORMANCE STATUS (ECOG) : 2 - Symptomatic, <50% confined to bed  Review of Systems Unless otherwise noted, a complete review of systems is negative.  Physical Exam General: NAD, frail appearing, thin Pulmonary: Unlabored Extremities: no edema, no joint deformities Skin: no rashes Neurological: Weakness but otherwise nonfocal  IMPRESSION: I met with patient infusion area.  Introduced palliative care services and attempted to establish therapeutic rapport.  Patient reports that she is doing reasonably well other than chronic fatigue.  She becomes exertionally fatigued with some shortness of breath when ambulating.  She finds she is able to rest and that symptoms are lessened with use of a walker.  She says she still functionally independent  with all of her own care but does have support from the [facility] staff with meals and other services.  Patient asked me about hospice but I explained that the plan is for continued treatment at this point.  Patient had some questions regarding her prognosis, which I encouraged her to address with Dr. Tasia Catchings.  Patient verbalized a desire to focus on comfort in the future and says that she wants to ensure that she is not a "burden" to her family.  Patient says that she had ACP documents in [place] but would like to complete new documents here in [place].  I did provide her with a copy of ACP documents.  I also reviewed with her a MOST Form, which I encouraged her to take home to discuss with family.  We can complete that during a future visit if she is interested.  Patient says that she would not want to be resuscitated nor have "heroics" at end-of-life.  She would be in agreement with short-term  hospitalization if needed.  PLAN: -Continue current scope of treatment -ACP/MOST Form reviewed -RTC in 2 to 3 weeks   Patient expressed understanding and was in agreement with this plan. She also understands that She can call the clinic at any time with any questions, concerns, or complaints.     Time Total: 30 minutes  Visit consisted of counseling and education dealing with the complex and emotionally intense issues of symptom management and palliative care in the setting of serious and potentially life-threatening illness.Greater than 50%  of this time was spent counseling and coordinating care related to the above assessment and plan.  Signed by: Altha Harm, PhD, NP-C

## 2019-07-26 LAB — BPAM PLATELET PHERESIS
Blood Product Expiration Date: 202101172359
ISSUE DATE / TIME: 202101151120
Unit Type and Rh: 7300

## 2019-07-26 LAB — PREPARE PLATELET PHERESIS: Unit division: 0

## 2019-07-31 ENCOUNTER — Inpatient Hospital Stay (HOSPITAL_BASED_OUTPATIENT_CLINIC_OR_DEPARTMENT_OTHER): Payer: Medicare Other | Admitting: Hospice and Palliative Medicine

## 2019-07-31 ENCOUNTER — Inpatient Hospital Stay: Payer: Medicare Other

## 2019-07-31 ENCOUNTER — Other Ambulatory Visit: Payer: Self-pay

## 2019-07-31 ENCOUNTER — Encounter: Payer: Self-pay | Admitting: Oncology

## 2019-07-31 ENCOUNTER — Inpatient Hospital Stay (HOSPITAL_BASED_OUTPATIENT_CLINIC_OR_DEPARTMENT_OTHER): Payer: Medicare Other | Admitting: Oncology

## 2019-07-31 VITALS — BP 125/72 | HR 89 | Temp 97.9°F | Resp 18 | Wt 166.1 lb

## 2019-07-31 DIAGNOSIS — D708 Other neutropenia: Secondary | ICD-10-CM | POA: Diagnosis not present

## 2019-07-31 DIAGNOSIS — E119 Type 2 diabetes mellitus without complications: Secondary | ICD-10-CM | POA: Diagnosis not present

## 2019-07-31 DIAGNOSIS — D72810 Lymphocytopenia: Secondary | ICD-10-CM | POA: Diagnosis not present

## 2019-07-31 DIAGNOSIS — Z515 Encounter for palliative care: Secondary | ICD-10-CM | POA: Diagnosis not present

## 2019-07-31 DIAGNOSIS — D46C Myelodysplastic syndrome with isolated del(5q) chromosomal abnormality: Secondary | ICD-10-CM

## 2019-07-31 DIAGNOSIS — Z794 Long term (current) use of insulin: Secondary | ICD-10-CM | POA: Diagnosis not present

## 2019-07-31 DIAGNOSIS — Z87891 Personal history of nicotine dependence: Secondary | ICD-10-CM | POA: Diagnosis not present

## 2019-07-31 DIAGNOSIS — E039 Hypothyroidism, unspecified: Secondary | ICD-10-CM | POA: Diagnosis not present

## 2019-07-31 DIAGNOSIS — D6189 Other specified aplastic anemias and other bone marrow failure syndromes: Secondary | ICD-10-CM | POA: Diagnosis not present

## 2019-07-31 DIAGNOSIS — D46Z Other myelodysplastic syndromes: Secondary | ICD-10-CM

## 2019-07-31 DIAGNOSIS — D709 Neutropenia, unspecified: Secondary | ICD-10-CM | POA: Diagnosis not present

## 2019-07-31 DIAGNOSIS — D696 Thrombocytopenia, unspecified: Secondary | ICD-10-CM

## 2019-07-31 DIAGNOSIS — D469 Myelodysplastic syndrome, unspecified: Secondary | ICD-10-CM | POA: Diagnosis not present

## 2019-07-31 DIAGNOSIS — K1379 Other lesions of oral mucosa: Secondary | ICD-10-CM

## 2019-07-31 DIAGNOSIS — Z7984 Long term (current) use of oral hypoglycemic drugs: Secondary | ICD-10-CM | POA: Diagnosis not present

## 2019-07-31 DIAGNOSIS — K580 Irritable bowel syndrome with diarrhea: Secondary | ICD-10-CM | POA: Diagnosis not present

## 2019-07-31 LAB — COMPREHENSIVE METABOLIC PANEL
ALT: 12 U/L (ref 0–44)
AST: 17 U/L (ref 15–41)
Albumin: 3.3 g/dL — ABNORMAL LOW (ref 3.5–5.0)
Alkaline Phosphatase: 93 U/L (ref 38–126)
Anion gap: 11 (ref 5–15)
BUN: 19 mg/dL (ref 8–23)
CO2: 22 mmol/L (ref 22–32)
Calcium: 8.6 mg/dL — ABNORMAL LOW (ref 8.9–10.3)
Chloride: 104 mmol/L (ref 98–111)
Creatinine, Ser: 1.34 mg/dL — ABNORMAL HIGH (ref 0.44–1.00)
GFR calc Af Amer: 43 mL/min — ABNORMAL LOW (ref >60)
GFR calc non Af Amer: 37 mL/min — ABNORMAL LOW (ref >60)
Glucose, Bld: 318 mg/dL — ABNORMAL HIGH (ref 70–99)
Potassium: 4.3 mmol/L (ref 3.5–5.1)
Sodium: 137 mmol/L (ref 135–145)
Total Bilirubin: 1 mg/dL (ref 0.3–1.2)
Total Protein: 6.5 g/dL (ref 6.5–8.1)

## 2019-07-31 LAB — CBC WITH DIFFERENTIAL/PLATELET
Abs Immature Granulocytes: 0.14 10*3/uL — ABNORMAL HIGH (ref 0.00–0.07)
Basophils Absolute: 0 10*3/uL (ref 0.0–0.1)
Basophils Relative: 1 %
Eosinophils Absolute: 0 10*3/uL (ref 0.0–0.5)
Eosinophils Relative: 1 %
HCT: 25.5 % — ABNORMAL LOW (ref 36.0–46.0)
Hemoglobin: 7.2 g/dL — ABNORMAL LOW (ref 12.0–15.0)
Immature Granulocytes: 10 %
Lymphocytes Relative: 35 %
Lymphs Abs: 0.5 10*3/uL — ABNORMAL LOW (ref 0.7–4.0)
MCH: 26.3 pg (ref 26.0–34.0)
MCHC: 28.2 g/dL — ABNORMAL LOW (ref 30.0–36.0)
MCV: 93.1 fL (ref 80.0–100.0)
Monocytes Absolute: 0.2 10*3/uL (ref 0.1–1.0)
Monocytes Relative: 14 %
Neutro Abs: 0.6 10*3/uL — ABNORMAL LOW (ref 1.7–7.7)
Neutrophils Relative %: 39 %
Platelets: 16 10*3/uL — CL (ref 150–400)
RBC: 2.74 MIL/uL — ABNORMAL LOW (ref 3.87–5.11)
RDW: 21.1 % — ABNORMAL HIGH (ref 11.5–15.5)
Smear Review: DECREASED
WBC: 1.4 10*3/uL — CL (ref 4.0–10.5)
nRBC: 1.4 % — ABNORMAL HIGH (ref 0.0–0.2)

## 2019-07-31 LAB — SAMPLE TO BLOOD BANK

## 2019-07-31 LAB — PATHOLOGIST SMEAR REVIEW

## 2019-07-31 MED ORDER — GNP VITAMIN B-12 1000 MCG PO TBCR: 1.0000 | EXTENDED_RELEASE_TABLET | Freq: Every day | ORAL | 1 refills | Status: AC

## 2019-07-31 MED ORDER — NYSTATIN 100000 UNIT/GM EX POWD: Freq: Two times a day (BID) | CUTANEOUS | 2 refills | Status: DC | PRN

## 2019-07-31 MED ORDER — CIPROFLOXACIN HCL 500 MG PO TABS: 500.0000 mg | ORAL_TABLET | Freq: Two times a day (BID) | ORAL | 0 refills | Status: AC

## 2019-07-31 MED ORDER — VALACYCLOVIR HCL 500 MG PO TABS: 500.0000 mg | ORAL_TABLET | Freq: Two times a day (BID) | ORAL | 0 refills | Status: AC

## 2019-07-31 NOTE — Progress Notes (Signed)
[place]  [phone] [phone]   Name: Yesenia Hart Date: 07/31/2019 MRN: [id number]  DOB: [birth year]  Patient Care Team: Glean Hess, MD as PCP - General (Internal Medicine) Earlie Server, MD as Consulting Physician (Oncology)    REASON FOR CONSULTATION: Yesenia Hart is a 79 y.o. female with multiple medical problems including transfusion dependent MDS, symptomatic anemia, thrombocytopenia, CAD, diverticular disease with history of rectal bleeding, diabetes, and hypothyroidism.  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  She is referred to palliative care to help address goals and manage ongoing symptoms.  SOCIAL HISTORY:     reports that she quit smoking about 22 years ago. Her smoking use included cigarettes. She smoked 1.00 pack per day. She has never used smokeless tobacco. She reports previous alcohol use. She reports that she does not use drugs.   Patient is married and lives there with her husband at [facility].  She has a son and daughter in [place] and another daughter in the [place] area.  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  ADVANCE DIRECTIVES:  Not on file  CODE STATUS:   PAST MEDICAL HISTORY: Past Medical History:  Diagnosis Date  . Anemia   . B12 deficiency 06/10/2018  . Clotting disorder ([place])   . Diabetes mellitus without complication ([place])   . IBS (irritable bowel syndrome)   . Iron deficiency anemia due to chronic blood loss 12/12/2017  . MDS (myelodysplastic syndrome) ([place])   . MDS (myelodysplastic syndrome) ([place])   . Thyroid disease     PAST SURGICAL HISTORY:  Past Surgical History:  Procedure Laterality Date  . APPENDECTOMY    . breast biopsy 3'    . CHOLECYSTECTOMY    . CORONARY ANGIOPLASTY WITH STENT PLACEMENT  2016   in [place]    HEMATOLOGY/ONCOLOGY HISTORY:  Oncology History Overview Note  #  May 2019- [BMBx in [place]]MDS-MDS IPSS-R high risk, mainly due to more than 3 cytogenetics abnormality; On  Vidaza [June 2019]  #    MDS (myelodysplastic syndrome) with 5q deletion ([place])  11/30/2017 Initial Diagnosis   MDS (myelodysplastic syndrome) with 5q deletion ([place])   12/17/2017 -  Chemotherapy   The patient had pegfilgrastim-cbqv (UDENYCA) injection 6 mg, 6 mg, Subcutaneous, Once, 2 of 2 cycles Administration: 6 mg (01/18/2018), 6 mg (02/18/2018) azaCITIDine (VIDAZA) chemo injection 132.5 mg, 75 mg/m2 = 132.5 mg, Subcutaneous,  Once, 16 of 18 cycles Dose modification: 32 mg/m2 (original dose 75 mg/m2, Cycle 17, Reason: Dose not tolerated), 35 mg/m2 (original dose 75 mg/m2, Cycle 17, Reason: Dose not tolerated), 37.5 mg/m2 (original dose 75 mg/m2, Cycle 17, Reason: Catheter Related Infection) Administration: 132.5 mg (12/17/2017), 132.5 mg (12/18/2017), 132.5 mg (12/19/2017), 132.5 mg (12/20/2017), 132.5 mg (12/21/2017), 132.5 mg (01/14/2018), 132.5 mg (01/15/2018), 132.5 mg (04/10/2018), 132.5 mg (05/06/2018), 132.5 mg (05/07/2018), 132.5 mg (05/08/2018), 132.5 mg (05/09/2018), 132.5 mg (05/10/2018), 132.5 mg (02/11/2018), 132.5 mg (03/12/2018), 132.5 mg (01/16/2018), 132.5 mg (01/17/2018), 132.5 mg (02/12/2018), 132.5 mg (02/13/2018), 132.5 mg (02/14/2018), 132.5 mg (02/15/2018), 132.5 mg (03/14/2018), 132.5 mg (03/15/2018), 132.5 mg (03/18/2018), 132.5 mg (04/08/2018), 132.5 mg (04/11/2018), 132.5 mg (04/12/2018), 132.5 mg (06/10/2018), 132.5 mg (06/11/2018), 132.5 mg (06/12/2018), 132.5 mg (06/13/2018), 132.5 mg (06/14/2018), 132.5 mg (07/15/2018), 132.5 mg (07/16/2018), 132.5 mg (07/17/2018), 132.5 mg (07/18/2018), 132.5 mg (07/19/2018), 132.5 mg (09/16/2018), 132.5 mg (09/17/2018), 132.5 mg (09/18/2018), 132.5 mg (09/19/2018), 132.5 mg (09/20/2018), 132.5 mg (08/12/2018), 132.5  mg (08/13/2018), 132.5 mg (08/14/2018), 132.5 mg (08/15/2018), 132.5 mg (08/16/2018), 132.5 mg (11/18/2018), 132.5 mg (11/19/2018), 132.5 mg (11/20/2018), 132.5 mg (11/21/2018), 132.5  mg (11/22/2018), 132.5 mg (10/21/2018), 132.5 mg (10/22/2018), 132.5 mg (10/23/2018), 132.5 mg (10/24/2018), 132.5 mg (10/25/2018), 132.5 mg (01/06/2019), 132.5 mg (01/07/2019), 132.5 mg (01/08/2019), 132.5 mg (01/09/2019), 132.5 mg (01/13/2019), 132.5 mg (02/17/2019), 132.5 mg (02/18/2019), 132.5 mg (02/19/2019), 132.5 mg (02/20/2019), 132.5 mg (02/21/2019), 132.5 mg (03/24/2019), 132.5 mg (03/25/2019), 132.5 mg (03/26/2019), 132.5 mg (03/27/2019), 132.5 mg (03/28/2019), 132.5 mg (05/05/2019), 132.5 mg (05/06/2019), 132.5 mg (05/07/2019), 132.5 mg (05/08/2019), 132.5 mg (05/09/2019)  for chemotherapy treatment.    MDS (myelodysplastic syndrome), high grade (HCC)    ALLERGIES:  is allergic to acarbose; demerol [meperidine hcl]; meperidine; and pioglitazone.  MEDICATIONS:  Current Outpatient Medications  Medication Sig Dispense Refill  . aminocaproic acid (AMICAR) 500 MG tablet Take 1 tablet (500 mg total) by mouth every 6 (six) hours. (Patient not taking: Reported on 07/31/2019) 120 tablet 0  . atorvastatin (LIPITOR) 20 MG tablet TAKE 1 TABLET BY MOUTH ONCE DAILY 30 tablet 5  . furosemide (LASIX) 20 MG tablet TAKE 1 TABLET BY MOUTH ONCE DAILY 30 tablet 5  . glimepiride (AMARYL) 4 MG tablet TAKE 1 TABLET BY MOUTH ONCE DAILY 90 tablet 1  . GNP VITAMIN B-12 1000 MCG TBCR TAKE 1 TABLET BY MOUTH ONCE DAILY 90 tablet 1  . Lancets (ONETOUCH ULTRASOFT) lancets USE AS DIRECTED. 100 each 12  . levothyroxine (SYNTHROID) 50 MCG tablet TAKE 1 TABLET BY MOUTH ONCE DAILY ON AN EMPTY STOMACH. WAIT 30 MINUTES BEFORE TAKING OTHER MEDS. 90 tablet 1  . loperamide (IMODIUM) 2 MG capsule Take 2 mg by mouth as needed for diarrhea or loose stools.    Marland Kitchen NOVOLOG MIX 70/30 FLEXPEN (70-30) 100 UNIT/ML FlexPen Inject 0.08 mLs (8 Units total) into the skin 2 (two) times daily with a meal. 21 mL 5  . nystatin (MYCOSTATIN/NYSTOP) powder nystatin 100,000 unit/gram topical powder   1 app by topical route.    . ondansetron (ZOFRAN) 8 MG tablet TAKE 1  TABLET BY MOUTH TWICE DAILY AS NEEDED NAUSEA AND VOMITING 30 tablet 1  . ONETOUCH ULTRA test strip TEST TWICE DAILY 200 each 1  . pantoprazole (PROTONIX) 40 MG tablet TAKE 1 TABLET BY MOUTH ONCE DAILY 90 tablet 1  . quinapril (ACCUPRIL) 20 MG tablet TAKE 1 TABLET BY MOUTH ONCE DAILY 90 tablet 1  . sulfaSALAzine (AZULFIDINE) 500 MG tablet TAKE 1 TABLET BY MOUTH AT BEDTIME 90 tablet 1  . ULTICARE MICRO PEN NEEDLES 32G X 4 MM MISC Inject 1 each into the skin 2 (two) times daily. 100 each 12   No current facility-administered medications for this visit.    VITAL SIGNS: There were no vitals taken for this visit. There were no vitals filed for this visit.  Estimated body mass index is 28.51 kg/m as calculated from the following:   Height as of 04/18/19: 5' 4"  (1.626 m).   Weight as of an earlier encounter on 07/31/19: 166 lb 1.6 oz (75.3 kg).  LABS: CBC:    Component Value Date/Time   WBC 1.4 (LL) 07/31/2019 0835   HGB 7.2 (L) 07/31/2019 0835   HCT 25.5 (L) 07/31/2019 0835   PLT 16 (LL) 07/31/2019 0835   MCV 93.1 07/31/2019 0835   NEUTROABS 0.6 (L) 07/31/2019 0835   LYMPHSABS 0.5 (L) 07/31/2019 0835   MONOABS 0.2 07/31/2019 0835   EOSABS 0.0 07/31/2019 0835   BASOSABS 0.0  07/31/2019 0835   Comprehensive Metabolic Panel:    Component Value Date/Time   NA 137 07/31/2019 0835   K 4.3 07/31/2019 0835   CL 104 07/31/2019 0835   CO2 22 07/31/2019 0835   BUN 19 07/31/2019 0835   CREATININE 1.34 (H) 07/31/2019 0835   GLUCOSE 318 (H) 07/31/2019 0835   CALCIUM 8.6 (L) 07/31/2019 0835   AST 17 07/31/2019 0835   ALT 12 07/31/2019 0835   ALKPHOS 93 07/31/2019 0835   BILITOT 1.0 07/31/2019 0835   PROT 6.5 07/31/2019 0835   ALBUMIN 3.3 (L) 07/31/2019 0835    RADIOGRAPHIC STUDIES: No results found.  PERFORMANCE STATUS (ECOG) : 2 - Symptomatic, <50% confined to bed  Review of Systems Unless otherwise noted, a complete review of systems is negative.  Physical Exam General: NAD,  frail appearing, thin Pulmonary: Unlabored Extremities: no edema, no joint deformities Skin: no rashes Neurological: Weakness but otherwise nonfocal  IMPRESSION: Routine follow-up visit.  Patient reports that she is doing well.  Has no acute complaints other than development of cold sores on lip and lower gum.  Patient reports that it is causing some pain with eating.  Spoke with Dr. Tasia Catchings who intends to start her on valacyclovir and Cipro.  Could also consider Magic mouthwash if needed.  Patient requests refill on nystatin powder.  She uses occasionally for yeast rash in groin area.  Patient says that she has not yet had a chance to speak with her daughter regarding completing ACP documents.  Will readdress in the future.  PLAN: -Continue current scope of treatment -Refill nystatin powder -ACP/MOST Form reviewed -RTC in 2 to 3 weeks   Patient expressed understanding and was in agreement with this plan. She also understands that She can call the clinic at any time with any questions, concerns, or complaints.     Time Total: 30 minutes  Visit consisted of counseling and education dealing with the complex and emotionally intense issues of symptom management and palliative care in the setting of serious and potentially life-threatening illness.Greater than 50%  of this time was spent counseling and coordinating care related to the above assessment and plan.  Signed by: Altha Harm, PhD, NP-C

## 2019-07-31 NOTE — Progress Notes (Signed)
Patient has multiple issues today:  --sores on her lips.  Abreva did help the sores on her bottom lip --temp at home is staying around 99 and she takes Tylenol before she goes to bed. --having worsening SOBr --has not started Amicar yet --needs a refill on B12 and Nystatin powder (powder has been prescribed by PCP)

## 2019-07-31 NOTE — Progress Notes (Signed)
Hematology/Oncology Follow up note [facility] [phone] Fax:([phone]) [phone]   Patient Care Team: Yesenia Hess, MD as PCP - General (Internal Medicine) Earlie Server, MD as Consulting Physician (Oncology)  REFERRING PROVIDER: Glean Hess, MD  Previous Oncologist Dr.Srujitha Murkutla REASON FOR VISIT Follow up for treatment of MDS  HISTORY OF PRESENTING ILLNESS:  Yesenia Hart is a  79 y.o.  female who recently moved from [place] to [place].  She used to live at [place] with husband.  Accompanied by daughter, Yesenia Hart who is a respiratory therapist at [employer].  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  Patient presented to emergency room in January 2019 with complaints of intermittent rectal bleeding and increased to have a hemoglobin of 3 and platelet counts was 59,000.  Received 5 units of PRBC and was discharged home with hemoglobin of 10.  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  Cauterization was deferred due to thrombocytopenia. Aspirin was discontinued due to GI bleeding. Peripheral blood flow, SPEP negative, no obvious signs of B12 deficiency.  Ultrasound of the abdomen showed mild fatty liver.  On October 04, 2017, patient underwent bone marrow biopsy. Bone marrow core biopsy, bone marrow aspirate clot, bone marrow aspirate smears showed  single linage dysplasia.  Less than 5% bone marrow blasts and no circulating blast. 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  20 metaphases, lites were normal and 18 metaphases 5 q. deletion, it has translocation involving  chromosome of 3, 17, 16, and iso-di centric 22.  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  Patient received IV iron infusion on moving to [place].  She has not had any treatment for MDS done due to the move.  She felt tired, no weight loss.  She reports easy bruising.  Intermittent epistaxis which spontaneously resolved after applying pressure denies any hematochezia, hematemesis, hemoptysis.  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 Although patient does have 5q deletion which if isolated, is a good cytogentic group, she carried other cytogentic abnormalities which increases her IPSS-R score.   # June 2019 started on azacitidine 54m/m2 D1-5 Q28 days treatments  #12/26/2018 repeat bone marrow biopsy results were discussed with patient. Bone marrow biopsy showed normocellular, dysplastic erythroid and megakaryocyte lineages.  Note increase of blast.  Consistent with MDS. MDS FISH panel showed deletion 5 q. Negative for monosomy 5, deletion 7 q., monosomy 7, trisomy 8 deletion 20 q. Cytogenetics reveals a single cell with del(3)(p21) and del(5)(q13q33).  # Last cycle azacitidine treatments was from 05/05/2019-05/12/2019 Azacitidine was stopped due to progressive cytopenia.  # She has chronic colitis with intermittent diarrhea.  She is on sulfasalazine.  Follows up with gastroenterology.  She declines colonoscopy for further work-up INTERVAL HISTORY GTvisha Schwoereris a 79y.o. female who has above history reviewed by me today presents for follow-up of MDS [facility]  [facility] During the interval, she reports developing sores on her lips. No fever or chills.  She measured her temperature at home, is around 99. She  had episode of rectal bleeding, and Amicar was to her pharmacy.  Patient had  not started yet. No additional bleeding Reports feeling fatigued. .  Review of Systems  Constitutional: Positive for fatigue. Negative for appetite change, chills and fever.  HENT:   Negative for hearing loss and voice change.        Source of the lip  Eyes: Negative for eye problems.  Respiratory: Negative for chest tightness and cough.   Cardiovascular: Negative for chest pain.  Gastrointestinal: Negative for abdominal distention and abdominal pain.       Small amount of blood in stool.  Resolved.  Endocrine: Negative for hot flashes.  Genitourinary: Negative for difficulty urinating and frequency.   Musculoskeletal: Negative for arthralgias.  Skin: Negative for itching and rash.  Neurological: Negative for extremity weakness.  Hematological: Negative for adenopathy.  Psychiatric/Behavioral: Negative for confusion.    MEDICAL HISTORY:  Past Medical History:  Diagnosis Date  . Anemia   . B12 deficiency 06/10/2018  . Clotting disorder ([place])   . Diabetes mellitus without complication ([place])   . IBS (irritable bowel syndrome)   . Iron deficiency anemia due to chronic blood loss 12/12/2017  . MDS (myelodysplastic syndrome) ([place])   . MDS (myelodysplastic syndrome) ([place])   . Thyroid disease     SURGICAL HISTORY: Past Surgical History:  Procedure Laterality Date  . APPENDECTOMY    . breast biopsy 63'    . CHOLECYSTECTOMY    . CORONARY ANGIOPLASTY WITH STENT PLACEMENT  2016   in [place]: Social History   Socioeconomic History  . Marital status: Married    Spouse name: Not on file  . Number of children: Not on file  . Years of education: Not on file  . Highest education level: Not on file  Occupational History  . Occupation: retired  Tobacco Use  . Smoking status: Former Smoker    Packs/day: 1.00    Types: Cigarettes    Quit date: 1999    Years since quitting: 22.0  . Smokeless tobacco: Never Used  Substance and Sexual Activity  . Alcohol use:  Not Currently  . Drug use: Never  . Sexual activity: Yes  Other Topics Concern  . Not on file  Social History Narrative  . Not on file   Social Determinants of Health   Financial Resource Strain:   . Difficulty of Paying Living Expenses: Not on file  Food Insecurity:   . Worried About Charity fundraiser in the Last Year: Not on file  . Ran Out of Food in the Last Year: Not on file  Transportation Needs:   . Lack of Transportation (Medical): Not on file  . Lack of Transportation (Non-Medical): Not on file  Physical Activity:   . Days of Exercise per Week: Not on file  . Minutes of Exercise per Session: Not on file  Stress:   . Feeling of Stress : Not on file  Social Connections:   . Frequency of Communication with Friends and Family: Not on file  . Frequency of Social Gatherings with Friends and Family: Not on file  . Attends Religious Services: Not on file  . Active Member of Clubs or Organizations: Not on file  . Attends Archivist Meetings: Not on file  . Marital Status: Not on file  Intimate Partner Violence:   . Fear of Current or Ex-Partner: Not on file  . Emotionally Abused: Not on file  . Physically Abused:  Not on file  . Sexually Abused: Not on file    FAMILY HISTORY: Family History  Adopted: Yes  Family history unknown: Yes    ALLERGIES:  is allergic to acarbose; demerol [meperidine hcl]; meperidine; and pioglitazone.  MEDICATIONS:  Current Outpatient Medications  Medication Sig Dispense Refill  . atorvastatin (LIPITOR) 20 MG tablet TAKE 1 TABLET BY MOUTH ONCE DAILY 30 tablet 5  . Cyanocobalamin (GNP VITAMIN B-12) 1000 MCG TBCR Take 1 tablet (1,000 mcg total) by mouth daily. 90 tablet 1  . furosemide (LASIX) 20 MG tablet TAKE 1 TABLET BY MOUTH ONCE DAILY 30 tablet 5  . glimepiride (AMARYL) 4 MG tablet TAKE 1 TABLET BY MOUTH ONCE DAILY 90 tablet 1  . Lancets (ONETOUCH ULTRASOFT) lancets USE AS DIRECTED. 100 each 12  . levothyroxine (SYNTHROID)  50 MCG tablet TAKE 1 TABLET BY MOUTH ONCE DAILY ON AN EMPTY STOMACH. WAIT 30 MINUTES BEFORE TAKING OTHER MEDS. 90 tablet 1  . loperamide (IMODIUM) 2 MG capsule Take 2 mg by mouth as needed for diarrhea or loose stools.    . NOVOLOG MIX 70/30 FLEXPEN (70-30) 100 UNIT/ML FlexPen Inject 0.08 mLs (8 Units total) into the skin 2 (two) times daily with a meal. 21 mL 5  . ondansetron (ZOFRAN) 8 MG tablet TAKE 1 TABLET BY MOUTH TWICE DAILY AS NEEDED NAUSEA AND VOMITING 30 tablet 1  . ONETOUCH ULTRA test strip TEST TWICE DAILY 200 each 1  . pantoprazole (PROTONIX) 40 MG tablet TAKE 1 TABLET BY MOUTH ONCE DAILY 90 tablet 1  . quinapril (ACCUPRIL) 20 MG tablet TAKE 1 TABLET BY MOUTH ONCE DAILY 90 tablet 1  . sulfaSALAzine (AZULFIDINE) 500 MG tablet TAKE 1 TABLET BY MOUTH AT BEDTIME 90 tablet 1  . ULTICARE MICRO PEN NEEDLES 32G X 4 MM MISC Inject 1 each into the skin 2 (two) times daily. 100 each 12  . aminocaproic acid (AMICAR) 500 MG tablet Take 1 tablet (500 mg total) by mouth every 6 (six) hours. (Patient not taking: Reported on 07/31/2019) 120 tablet 0  . ciprofloxacin (CIPRO) 500 MG tablet Take 1 tablet (500 mg total) by mouth 2 (two) times daily. 60 tablet 0  . nystatin (MYCOSTATIN/NYSTOP) powder Apply topically 2 (two) times daily as needed. 15 g 2  . valACYclovir (VALTREX) 500 MG tablet Take 1 tablet (500 mg total) by mouth 2 (two) times daily. 60 tablet 0   No current facility-administered medications for this visit.     PHYSICAL EXAMINATION: ECOG PERFORMANCE STATUS: 1 - Symptomatic but completely ambulatory Vitals:   07/31/19 0936  BP: 125/72  Pulse: 89  Resp: 18  Temp: 97.9 F (36.6 C)   Filed Weights   07/31/19 0936  Weight: 166 lb 1.6 oz (75.3 kg)    Physical Exam Constitutional:      General: She is not in acute distress.    Comments: Walks with a walker.   HENT:     Head: Normocephalic and atraumatic.  Eyes:     General: No scleral icterus.    Conjunctiva/sclera:  Conjunctivae normal.     Pupils: Pupils are equal, round, and reactive to light.  Cardiovascular:     Rate and Rhythm: Normal rate and regular rhythm.     Heart sounds: Normal heart sounds.  Pulmonary:     Effort: Pulmonary effort is normal. No respiratory distress.     Breath sounds: Normal breath sounds. No wheezing or rales.  Chest:     Chest wall: No tenderness.    Abdominal:     General: Bowel sounds are normal. There is no distension.     Palpations: Abdomen is soft. There is no mass.     Tenderness: There is no abdominal tenderness.  Musculoskeletal:        General: No deformity. Normal range of motion.     Cervical back: Normal range of motion and neck supple.  Lymphadenopathy:     Cervical: No cervical adenopathy.  Skin:    General: Skin is warm and dry.     Findings: No erythema or rash.     Comments: Multiple bruises on extremities.  Neurological:     Mental Status: She is alert and oriented to person, place, and time.     Cranial Nerves: No cranial nerve deficit.     Coordination: Coordination normal.  Psychiatric:        Mood and Affect: Mood normal.      LABORATORY DATA:  I have reviewed the data as listed Lab Results  Component Value Date   WBC 1.4 (LL) 07/31/2019   HGB 7.2 (L) 07/31/2019   HCT 25.5 (L) 07/31/2019   MCV 93.1 07/31/2019   PLT 16 (LL) 07/31/2019   Recent Labs    06/09/19 0933 06/23/19 0850 07/31/19 0835  NA 134* 137 137  K 4.7 4.5 4.3  CL 104 106 104  CO2 21* 21* 22  GLUCOSE 311* 335* 318*  BUN _0 CREATININE 1.20* 1.30* 1.34*  CALCIUM 8.4* 8.5* 8.6*  GFRNONAA 43* 39* 37*  GFRAA 49* 45* 43*  PROT 6.4* 6.5 6.5  ALBUMIN 3.5 3.4* 3.3*  AST _1 ALT _2 ALKPHOS 102 113 93  BILITOT 0.8 0.7 1.0       ASSESSMENT & PLAN:  1. MDS (myelodysplastic syndrome), high grade (HCC)   2. Other neutropenia (HCC)   3. 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 Thrombocytopenia ([place])   5. Mouth sores   6.  Lymphocytopenia     # MDS IPSS-R high risk, >3 cytogenetics abnormality/platelet counts less than 50,000 Labs reviewed and discussed with patient. Patient informs me that she is not interested in going to [facility] for clinical trial.  She has an appointment with [facility] at the next week.  Encourage patient to keep her appointment and talk about her other options.  Awaiting myeloid mutation panel, especially TP53 which may indicate chemo resistance Continue supportive care for now. -Her counts are progressively worsening.  I will check peripheral smear for assessment of peripheral blast.  Concerning for progression/transformation to leukemia.  I think she may need to repeat a bone marrow biopsy.-Peripheral smear showed scattered immature granulocytes, less than 10%.  -Transfuse irradiated PRBC to keep hemoglobin above 7, irradiated platelet to keep platelets above 10,000 or higher bleeding  #.neutropenia has worsened.  I will start patient on Cipro 57m twice daily for prophylaxis. 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 start valacyclovir 500 mg twice daily.  #Rectal bleeding, currently resolved.  Discussed with patient about using to reduce possible bleeding.  GI tract. [facility] I recommend patient to follow-up with me in 2 weeks with repeat blood work and supportive care.      ZEarlie Server MD, PhD 07/31/2019

## 2019-08-05 ENCOUNTER — Other Ambulatory Visit: Payer: Self-pay | Admitting: Internal Medicine

## 2019-08-05 MED ORDER — ULTICARE MICRO PEN NEEDLES 32G X 4 MM MISC: 1.0000 | Freq: Two times a day (BID) | 12 refills | Status: AC

## 2019-08-06 DIAGNOSIS — D469 Myelodysplastic syndrome, unspecified: Secondary | ICD-10-CM | POA: Diagnosis not present

## 2019-08-07 ENCOUNTER — Emergency Department: Payer: Medicare Other

## 2019-08-07 ENCOUNTER — Encounter: Payer: Self-pay | Admitting: Oncology

## 2019-08-07 ENCOUNTER — Emergency Department
Admission: EM | Admit: 2019-08-07 | Discharge: 2019-08-08 | Disposition: A | Discharge status: Home / Self Care | Payer: Medicare Other | Attending: Emergency Medicine | Admitting: Emergency Medicine

## 2019-08-07 ENCOUNTER — Other Ambulatory Visit: Payer: Self-pay

## 2019-08-07 DIAGNOSIS — E034 Atrophy of thyroid (acquired): Secondary | ICD-10-CM | POA: Insufficient documentation

## 2019-08-07 DIAGNOSIS — Z79899 Other long term (current) drug therapy: Secondary | ICD-10-CM | POA: Insufficient documentation

## 2019-08-07 DIAGNOSIS — D469 Myelodysplastic syndrome, unspecified: Secondary | ICD-10-CM | POA: Diagnosis not present

## 2019-08-07 DIAGNOSIS — Z87891 Personal history of nicotine dependence: Secondary | ICD-10-CM | POA: Diagnosis not present

## 2019-08-07 DIAGNOSIS — I2510 Atherosclerotic heart disease of native coronary artery without angina pectoris: Secondary | ICD-10-CM | POA: Diagnosis not present

## 2019-08-07 DIAGNOSIS — D46Z Other myelodysplastic syndromes: Secondary | ICD-10-CM | POA: Insufficient documentation

## 2019-08-07 DIAGNOSIS — M7989 Other specified soft tissue disorders: Secondary | ICD-10-CM | POA: Diagnosis not present

## 2019-08-07 DIAGNOSIS — M19032 Primary osteoarthritis, left wrist: Secondary | ICD-10-CM | POA: Diagnosis not present

## 2019-08-07 DIAGNOSIS — G5602 Carpal tunnel syndrome, left upper limb: Secondary | ICD-10-CM | POA: Diagnosis not present

## 2019-08-07 DIAGNOSIS — I10 Essential (primary) hypertension: Secondary | ICD-10-CM | POA: Diagnosis not present

## 2019-08-07 DIAGNOSIS — M25532 Pain in left wrist: Secondary | ICD-10-CM | POA: Diagnosis present

## 2019-08-07 DIAGNOSIS — J45909 Unspecified asthma, uncomplicated: Secondary | ICD-10-CM | POA: Diagnosis not present

## 2019-08-07 DIAGNOSIS — Z794 Long term (current) use of insulin: Secondary | ICD-10-CM | POA: Diagnosis not present

## 2019-08-07 DIAGNOSIS — E119 Type 2 diabetes mellitus without complications: Secondary | ICD-10-CM | POA: Diagnosis not present

## 2019-08-07 LAB — BASIC METABOLIC PANEL
Anion gap: 10 (ref 5–15)
BUN: 19 mg/dL (ref 8–23)
CO2: 23 mmol/L (ref 22–32)
Calcium: 8.5 mg/dL — ABNORMAL LOW (ref 8.9–10.3)
Chloride: 102 mmol/L (ref 98–111)
Creatinine, Ser: 1.18 mg/dL — ABNORMAL HIGH (ref 0.44–1.00)
GFR calc Af Amer: 50 mL/min — ABNORMAL LOW (ref >60)
GFR calc non Af Amer: 44 mL/min — ABNORMAL LOW (ref >60)
Glucose, Bld: 366 mg/dL — ABNORMAL HIGH (ref 70–99)
Potassium: 4.2 mmol/L (ref 3.5–5.1)
Sodium: 135 mmol/L (ref 135–145)

## 2019-08-07 LAB — CBC
HCT: 23.3 % — ABNORMAL LOW (ref 36.0–46.0)
Hemoglobin: 6.7 g/dL — ABNORMAL LOW (ref 12.0–15.0)
MCH: 25.9 pg — ABNORMAL LOW (ref 26.0–34.0)
MCHC: 28.8 g/dL — ABNORMAL LOW (ref 30.0–36.0)
MCV: 90 fL (ref 80.0–100.0)
Platelets: 10 10*3/uL — CL (ref 150–400)
RBC: 2.59 MIL/uL — ABNORMAL LOW (ref 3.87–5.11)
RDW: 21.7 % — ABNORMAL HIGH (ref 11.5–15.5)
WBC: 1.5 10*3/uL — ABNORMAL LOW (ref 4.0–10.5)
nRBC: 4.6 % — ABNORMAL HIGH (ref 0.0–0.2)

## 2019-08-07 LAB — TROPONIN I (HIGH SENSITIVITY): Troponin I (High Sensitivity): 8 ng/L (ref <18)

## 2019-08-07 LAB — PREPARE RBC (CROSSMATCH)

## 2019-08-07 MED ORDER — SODIUM CHLORIDE 0.9 % IV SOLN
10.0000 mL/h | Freq: Once | INTRAVENOUS | Status: AC
  Administered 2019-08-07: 10 mL/h via INTRAVENOUS

## 2019-08-07 NOTE — ED Notes (Signed)
See triage note. Pt reports right arm pain focused around her wrist and radiating up to her elbow. Pain began upon waking this afternoon. Pt also with Hx of MLS anemia.

## 2019-08-07 NOTE — ED Provider Notes (Signed)
-----------------------------------------   11:38 PM on 08/07/2019 -----------------------------------------  Assuming care from Dr. Joni Fears.  In short, Yesenia Hart is a 79 y.o. female with a chief complaint of wrist pain, but is getting a blood transfusion due to acute on chronic issues.  Refer to the original H&P for additional details.  The current plan of care is to discharge after blood transfusion.   ----------------------------------------- 1:21 AM on 08/08/2019 -----------------------------------------  Patient tolerating transfusion without difficulty.  Will discharge once transfusion is complete.   Hinda Kehr, MD 08/08/19 [phone]

## 2019-08-07 NOTE — ED Triage Notes (Signed)
PT to ED via POV c/o left hand and wrist pain, states elbow does not hurt nor shoulder. PT denies back pain, chest pain, jaw pain, or increased SOB. Endorses slight nausea with no vomiting. PT states her hgb is 7 right now but this is managed with infusions.

## 2019-08-07 NOTE — Discharge Instructions (Addendum)
Wear the wrist brace for sleep and follow up with your primary care doctor for further evaluation of your wrist pain. Keep your appointment with hematology as scheduled.

## 2019-08-07 NOTE — ED Provider Notes (Signed)
[facility] Emergency Department Provider Note  ____________________________________________  Time seen: Approximately 11:22 PM  I have reviewed the triage vital signs and the nursing notes.   HISTORY  Chief Complaint Arm Pain    HPI Yesenia Hart is a 79 y.o. female with a history of diabetes, IBS, high-grade myelodysplastic syndrome who comes ED complaining of left wrist pain that she noticed on waking up from sleep this morning.  Worse with movement, radiates into the hand.  No alleviating factors, moderate intensity.  Denies trauma.  Denies fever or chills.  She has myelodysplastic syndrome, gets recurrent anemia, does report worsened generalized weakness and mild dizziness with walking consistent with anemia.  No black or bloody stool or any bleeding.  Past Medical History:  Diagnosis Date  . Anemia   . B12 deficiency 06/10/2018  . Clotting disorder ([place])   . Diabetes mellitus without complication ([place])   . IBS (irritable bowel syndrome)   . Iron deficiency anemia due to chronic blood loss 12/12/2017  . MDS (myelodysplastic syndrome) ([place])   . MDS (myelodysplastic syndrome) ([place])   . Thyroid disease      Patient Active Problem List   Diagnosis Date Noted  . Neutropenia ([place]) 12/24/2018  . Anemia 12/24/2018  . MDS (myelodysplastic syndrome), high grade ([place]) 11/18/2018  . Gastroesophageal reflux disease 07/19/2018  . B12 deficiency 06/10/2018  . 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 CAD (coronary artery disease), native coronary artery 03/15/2018  . Drug-induced neutropenia ([place]) 01/14/2018  . Type II diabetes mellitus with complication ([place]) 54/03/8118  . Fever blister 12/17/2017  . Tobacco use disorder, moderate, in sustained remission 12/17/2017  . Thrombocytopenia ([place]) 12/12/2017  . Goals of care, counseling/discussion 12/12/2017  . Other fatigue 12/12/2017  . Asthma 11/30/2017  . Cataract 11/30/2017  .  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 Edema 11/30/2017  . Essential hypertension 11/30/2017  . Hyperlipidemia associated with type 2 diabetes mellitus ([place]) 11/30/2017  . Osteoarthritis 11/30/2017  . 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 MDS (myelodysplastic syndrome) with 5q deletion ([place]) 11/30/2017     Past Surgical History:  Procedure Laterality Date  . APPENDECTOMY    . breast biopsy 66'    . CHOLECYSTECTOMY    . CORONARY ANGIOPLASTY WITH STENT PLACEMENT  2016   in [place]     Prior to Admission medications   Medication Sig Start Date End Date Taking? Authorizing Provider  aminocaproic acid (AMICAR) 500 MG tablet Take 1 tablet (500 mg total) by mouth every 6 (six) hours. Patient not taking: Reported on 07/31/2019 07/25/19   Earlie Server, MD  atorvastatin (LIPITOR) 20 MG tablet TAKE 1 TABLET BY MOUTH ONCE DAILY 05/22/19   Glean Hess, MD  ciprofloxacin (CIPRO) 500 MG tablet Take 1 tablet (500 mg total) by mouth 2 (two) times daily. 07/31/19   Earlie Server, MD  Cyanocobalamin (GNP VITAMIN B-12) 1000 MCG TBCR Take 1 tablet (1,000 mcg total) by mouth daily. 07/31/19   Earlie Server, MD  furosemide (LASIX) 20 MG tablet TAKE 1 TABLET BY MOUTH ONCE DAILY 06/29/19   Glean Hess, MD  glimepiride (AMARYL) 4 MG tablet TAKE 1 TABLET BY MOUTH ONCE DAILY 05/28/19   Glean Hess, MD  Insulin Pen Needle (ULTICARE MICRO PEN NEEDLES) 32G X 4 MM MISC Inject 1 each into the skin 2 (two) times daily. 08/05/19   Glean Hess, MD  Lancets [facility] ULTRASOFT) lancets USE AS DIRECTED. 06/29/19   Halina Maidens  H, MD  levothyroxine (SYNTHROID) 50 MCG tablet TAKE 1 TABLET BY MOUTH ONCE DAILY ON AN EMPTY STOMACH. WAIT 30 MINUTES BEFORE TAKING OTHER MEDS. 04/22/19   Glean Hess, MD  loperamide (IMODIUM) 2 MG capsule Take 2 mg by mouth as needed for diarrhea or loose stools.    [provider]  NOVOLOG MIX 70/30 FLEXPEN (70-30) 100 UNIT/ML FlexPen Inject 0.08 mLs (8 Units total)  into the skin 2 (two) times daily with a meal. 12/26/18   Glean Hess, MD  nystatin (MYCOSTATIN/NYSTOP) powder Apply topically 2 (two) times daily as needed. 07/31/19   Earlie Server, MD  ondansetron (ZOFRAN) 8 MG tablet TAKE 1 TABLET BY MOUTH TWICE DAILY AS NEEDED NAUSEA AND VOMITING 07/25/18   Earlie Server, MD  [facility] ULTRA test strip TEST TWICE DAILY 04/19/19   Glean Hess, MD  pantoprazole (PROTONIX) 40 MG tablet TAKE 1 TABLET BY MOUTH ONCE DAILY 07/14/19   Glean Hess, MD  quinapril (ACCUPRIL) 20 MG tablet TAKE 1 TABLET BY MOUTH ONCE DAILY 06/04/19   Glean Hess, MD  sulfaSALAzine (AZULFIDINE) 500 MG tablet TAKE 1 TABLET BY MOUTH AT BEDTIME 05/16/19   Glean Hess, MD  valACYclovir (VALTREX) 500 MG tablet Take 1 tablet (500 mg total) by mouth 2 (two) times daily. 07/31/19   Earlie Server, MD     Allergies Acarbose, Demerol [meperidine hcl], Meperidine, and Pioglitazone   Family History  Adopted: Yes  Family history unknown: Yes    Social History Social History   Tobacco Use  . Smoking status: Former Smoker    Packs/day: 1.00    Types: Cigarettes    Quit date: 1999    Years since quitting: 22.0  . Smokeless tobacco: Never Used  Substance Use Topics  . Alcohol use: Not Currently  . Drug use: Never    Review of Systems  Constitutional:   No fever or chills.  ENT:   No sore throat. No rhinorrhea. Cardiovascular:   No chest pain or syncope. Respiratory:   No dyspnea or cough. Gastrointestinal:   Negative for abdominal pain, vomiting and diarrhea.  Musculoskeletal:   Left wrist pain as above All other systems reviewed and are negative except as documented above in ROS and HPI.  ____________________________________________   PHYSICAL EXAM:  VITAL SIGNS: ED Triage Vitals  Enc Vitals Group     BP 08/07/19 1946 (!) 122/56     Pulse Rate 08/07/19 1946 95     Resp 08/07/19 1946 20     Temp 08/07/19 1946 99.8 F (37.7 C)     Temp Source 08/07/19 1946 Oral      SpO2 08/07/19 1946 100 %     Weight 08/07/19 1950 166 lb (75.3 kg)     Height 08/07/19 1950 5' 4"  (1.626 m)     Head Circumference --      Peak Flow --      Pain Score 08/07/19 1950 8     Pain Loc --      Pain Edu? --      Excl. in [place]? --     Vital signs reviewed, nursing assessments reviewed.   Constitutional:   Alert and oriented. Non-toxic appearance. Eyes:   Conjunctivae are pale. EOMI. PERRL. ENT      Head:   Normocephalic and atraumatic.      Nose:   Wearing a mask.      Mouth/Throat:   Wearing a mask.      Neck:  No meningismus. Full ROM. Hematological/Lymphatic/Immunilogical:   No cervical lymphadenopathy. Cardiovascular:   RRR. Symmetric bilateral radial and DP pulses.  No murmurs. Cap refill less than 2 seconds. Respiratory:   Normal respiratory effort without tachypnea/retractions. Breath sounds are clear and equal bilaterally. No wheezes/rales/rhonchi. Gastrointestinal:   Soft and nontender. Non distended. There is no CVA tenderness.  No rebound, rigidity, or guarding.  Musculoskeletal:   Normal range of motion in all extremities. No joint effusions.  No lower extremity tenderness.  Mild swelling about the left wrist.  No severe pain with passive range of motion or joint line palpation.  Positive Phalen and Tinel's sign Neurologic:   Normal speech and language.  Motor grossly intact. No acute focal neurologic deficits are appreciated.  Skin:    Skin is warm, dry and intact. No rash noted.  No petechiae, purpura, or bullae.  ____________________________________________    LABS (pertinent positives/negatives) (all labs ordered are listed, but only abnormal results are displayed) Labs Reviewed  BASIC METABOLIC PANEL - Abnormal; Notable for the following components:      Result Value   Glucose, Bld 366 (*)    Creatinine, Ser 1.18 (*)    Calcium 8.5 (*)    GFR calc non Af Amer 44 (*)    GFR calc Af Amer 50 (*)    All other components within normal limits   CBC - Abnormal; Notable for the following components:   WBC 1.5 (*)    RBC 2.59 (*)    Hemoglobin 6.7 (*)    HCT 23.3 (*)    MCH 25.9 (*)    MCHC 28.8 (*)    RDW 21.7 (*)    Platelets 10 (*)    nRBC 4.6 (*)    All other components within normal limits  PREPARE RBC (CROSSMATCH)  TYPE AND SCREEN  TROPONIN I (HIGH SENSITIVITY)   ____________________________________________   EKG    ____________________________________________    RADIOLOGY  DG Wrist Complete Left  Result Date: 08/07/2019 CLINICAL DATA:  Pain, swelling left wrist.  No known injury. EXAM: LEFT WRIST - COMPLETE 3+ VIEW COMPARISON:  None. FINDINGS: Advanced osteoarthritis at the 1st carpometacarpal joint and radiocarpal joint. No acute bony abnormality. Specifically, no fracture, subluxation, or dislocation. IMPRESSION: Advanced osteoarthritis.  No acute bony abnormality. Electronically Signed   By: Rolm Baptise M.D.   On: 08/07/2019 21:45    ____________________________________________   PROCEDURES .Critical Care Performed by: Carrie Mew, MD Authorized by: Carrie Mew, MD   Critical care provider statement:    Critical care time (minutes):  35   Critical care time was exclusive of:  Separately billable procedures and treating other patients   Critical care was necessary to treat or prevent imminent or life-threatening deterioration of the following conditions:  Circulatory failure   Critical care was time spent personally by me on the following activities:  Development of treatment plan with patient or surrogate, discussions with consultants, evaluation of patient's response to treatment, examination of patient, obtaining history from patient or surrogate, ordering and performing treatments and interventions, ordering and review of laboratory studies, ordering and review of radiographic studies, pulse oximetry, re-evaluation of patient's condition and review of old  charts    ____________________________________________    CLINICAL IMPRESSION / ASSESSMENT AND PLAN / ED COURSE  Medications ordered in the ED: Medications  0.9 %  sodium chloride infusion (has no administration in time range)    Pertinent labs & imaging results that were available during my care of the  patient were reviewed by me and considered in my medical decision making (see chart for details).  Yesenia Hart was evaluated in Emergency Department on 08/07/2019 for the symptoms described in the history of present illness. She was evaluated in the context of the global COVID-19 pandemic, which necessitated consideration that the patient might be at risk for infection with the SARS-CoV-2 virus that causes COVID-19. Institutional protocols and algorithms that pertain to the evaluation of patients at risk for COVID-19 are in a state of rapid change based on information released by regulatory bodies including the CDC and federal and state organizations. These policies and algorithms were followed during the patient's care in the ED.   Patient presents with left wrist pain which clinically is consistent with carpal tunnel syndrome.  Will treat with a wrist brace and primary care follow-up.  Reviewed extensive hematology notes relating to her high-grade MDS which is chemo resistant.  They have transfusion goals of keeping platelets at 10 or above and hemoglobin is 7 or above.  Labs today show that her hemoglobin is 6.7 so she will be transfused 1 unit of irradiated red cells for symptomatic anemia after which she can be discharged home.  She has follow-up with Dr. Tasia Catchings in 1 week.      ____________________________________________   FINAL CLINICAL IMPRESSION(S) / ED DIAGNOSES    Final diagnoses:  Acute pain of left wrist  Carpal tunnel syndrome of left wrist  MDS (myelodysplastic syndrome), high grade [facility])     ED Discharge Orders    None      Portions of this note were  generated with dragon dictation software. Dictation errors may occur despite best attempts at proofreading.   Carrie Mew, MD 08/07/19 2326

## 2019-08-08 DIAGNOSIS — G5602 Carpal tunnel syndrome, left upper limb: Secondary | ICD-10-CM | POA: Diagnosis not present

## 2019-08-08 LAB — BPAM RBC
Blood Product Expiration Date: 202102182359
ISSUE DATE / TIME: 202101282315
Unit Type and Rh: 7300

## 2019-08-08 LAB — TYPE AND SCREEN
ABO/RH(D): B POS
Antibody Screen: NEGATIVE
Unit division: 0

## 2019-08-08 NOTE — ED Notes (Signed)
Called pt's daughter to update and inform that pt is almost through with her blood transfusion.

## 2019-08-12 ENCOUNTER — Telehealth: Payer: Self-pay | Admitting: Oncology

## 2019-08-12 ENCOUNTER — Inpatient Hospital Stay
Admission: EM | Admit: 2019-08-12 | Discharge: 2019-08-15 | DRG: 378 | Disposition: A | Discharge status: Home Health | Payer: Medicare Other | Attending: Student | Admitting: Student

## 2019-08-12 ENCOUNTER — Other Ambulatory Visit: Payer: Self-pay

## 2019-08-12 ENCOUNTER — Telehealth: Payer: Self-pay | Admitting: Internal Medicine

## 2019-08-12 DIAGNOSIS — N1831 Chronic kidney disease, stage 3a: Secondary | ICD-10-CM | POA: Diagnosis present

## 2019-08-12 DIAGNOSIS — Z794 Long term (current) use of insulin: Secondary | ICD-10-CM

## 2019-08-12 DIAGNOSIS — K219 Gastro-esophageal reflux disease without esophagitis: Secondary | ICD-10-CM

## 2019-08-12 DIAGNOSIS — I2510 Atherosclerotic heart disease of native coronary artery without angina pectoris: Secondary | ICD-10-CM | POA: Diagnosis present

## 2019-08-12 DIAGNOSIS — E034 Atrophy of thyroid (acquired): Secondary | ICD-10-CM | POA: Diagnosis present

## 2019-08-12 DIAGNOSIS — I10 Essential (primary) hypertension: Secondary | ICD-10-CM | POA: Diagnosis not present

## 2019-08-12 DIAGNOSIS — Z888 Allergy status to other drugs, medicaments and biological substances status: Secondary | ICD-10-CM

## 2019-08-12 DIAGNOSIS — E1129 Type 2 diabetes mellitus with other diabetic kidney complication: Secondary | ICD-10-CM | POA: Diagnosis present

## 2019-08-12 DIAGNOSIS — K5520 Angiodysplasia of colon without hemorrhage: Secondary | ICD-10-CM | POA: Diagnosis present

## 2019-08-12 DIAGNOSIS — R6881 Early satiety: Secondary | ICD-10-CM | POA: Diagnosis present

## 2019-08-12 DIAGNOSIS — D46Z Other myelodysplastic syndromes: Secondary | ICD-10-CM | POA: Diagnosis not present

## 2019-08-12 DIAGNOSIS — I129 Hypertensive chronic kidney disease with stage 1 through stage 4 chronic kidney disease, or unspecified chronic kidney disease: Secondary | ICD-10-CM | POA: Diagnosis present

## 2019-08-12 DIAGNOSIS — D649 Anemia, unspecified: Secondary | ICD-10-CM

## 2019-08-12 DIAGNOSIS — Z955 Presence of coronary angioplasty implant and graft: Secondary | ICD-10-CM | POA: Diagnosis not present

## 2019-08-12 DIAGNOSIS — D46C Myelodysplastic syndrome with isolated del(5q) chromosomal abnormality: Secondary | ICD-10-CM

## 2019-08-12 DIAGNOSIS — E785 Hyperlipidemia, unspecified: Secondary | ICD-10-CM | POA: Diagnosis present

## 2019-08-12 DIAGNOSIS — D500 Iron deficiency anemia secondary to blood loss (chronic): Secondary | ICD-10-CM | POA: Diagnosis present

## 2019-08-12 DIAGNOSIS — G5600 Carpal tunnel syndrome, unspecified upper limb: Secondary | ICD-10-CM | POA: Diagnosis present

## 2019-08-12 DIAGNOSIS — Z79899 Other long term (current) drug therapy: Secondary | ICD-10-CM | POA: Diagnosis not present

## 2019-08-12 DIAGNOSIS — D61818 Other pancytopenia: Secondary | ICD-10-CM | POA: Diagnosis not present

## 2019-08-12 DIAGNOSIS — D696 Thrombocytopenia, unspecified: Secondary | ICD-10-CM | POA: Diagnosis not present

## 2019-08-12 DIAGNOSIS — E1169 Type 2 diabetes mellitus with other specified complication: Secondary | ICD-10-CM | POA: Diagnosis present

## 2019-08-12 DIAGNOSIS — E1122 Type 2 diabetes mellitus with diabetic chronic kidney disease: Secondary | ICD-10-CM | POA: Diagnosis present

## 2019-08-12 DIAGNOSIS — K529 Noninfective gastroenteritis and colitis, unspecified: Secondary | ICD-10-CM | POA: Diagnosis present

## 2019-08-12 DIAGNOSIS — D689 Coagulation defect, unspecified: Secondary | ICD-10-CM | POA: Diagnosis present

## 2019-08-12 DIAGNOSIS — Z66 Do not resuscitate: Secondary | ICD-10-CM | POA: Diagnosis present

## 2019-08-12 DIAGNOSIS — N183 Chronic kidney disease, stage 3 unspecified: Secondary | ICD-10-CM | POA: Diagnosis present

## 2019-08-12 DIAGNOSIS — M25432 Effusion, left wrist: Secondary | ICD-10-CM | POA: Diagnosis present

## 2019-08-12 DIAGNOSIS — K625 Hemorrhage of anus and rectum: Secondary | ICD-10-CM | POA: Diagnosis not present

## 2019-08-12 DIAGNOSIS — Z79890 Hormone replacement therapy (postmenopausal): Secondary | ICD-10-CM

## 2019-08-12 DIAGNOSIS — M19032 Primary osteoarthritis, left wrist: Secondary | ICD-10-CM | POA: Diagnosis present

## 2019-08-12 DIAGNOSIS — E538 Deficiency of other specified B group vitamins: Secondary | ICD-10-CM | POA: Diagnosis present

## 2019-08-12 DIAGNOSIS — Z87891 Personal history of nicotine dependence: Secondary | ICD-10-CM

## 2019-08-12 DIAGNOSIS — K922 Gastrointestinal hemorrhage, unspecified: Secondary | ICD-10-CM | POA: Diagnosis not present

## 2019-08-12 DIAGNOSIS — K31819 Angiodysplasia of stomach and duodenum without bleeding: Secondary | ICD-10-CM | POA: Diagnosis present

## 2019-08-12 DIAGNOSIS — Z20822 Contact with and (suspected) exposure to covid-19: Secondary | ICD-10-CM | POA: Diagnosis present

## 2019-08-12 DIAGNOSIS — D469 Myelodysplastic syndrome, unspecified: Secondary | ICD-10-CM | POA: Diagnosis not present

## 2019-08-12 DIAGNOSIS — R040 Epistaxis: Secondary | ICD-10-CM | POA: Diagnosis not present

## 2019-08-12 DIAGNOSIS — E1136 Type 2 diabetes mellitus with diabetic cataract: Secondary | ICD-10-CM | POA: Diagnosis present

## 2019-08-12 DIAGNOSIS — Z7189 Other specified counseling: Secondary | ICD-10-CM | POA: Diagnosis not present

## 2019-08-12 DIAGNOSIS — Z03818 Encounter for observation for suspected exposure to other biological agents ruled out: Secondary | ICD-10-CM | POA: Diagnosis not present

## 2019-08-12 DIAGNOSIS — H269 Unspecified cataract: Secondary | ICD-10-CM | POA: Diagnosis present

## 2019-08-12 LAB — CBC
HCT: 25 % — ABNORMAL LOW (ref 36.0–46.0)
HCT: 22.6 % — ABNORMAL LOW (ref 36.0–46.0)
HCT: 22.6 % — ABNORMAL LOW (ref 36.0–46.0)
Hemoglobin: 7.6 g/dL — ABNORMAL LOW (ref 12.0–15.0)
Hemoglobin: 7 g/dL — ABNORMAL LOW (ref 12.0–15.0)
Hemoglobin: 7 g/dL — ABNORMAL LOW (ref 12.0–15.0)
MCH: 27 pg (ref 26.0–34.0)
MCH: 27.3 pg (ref 26.0–34.0)
MCH: 27.3 pg (ref 26.0–34.0)
MCHC: 30.4 g/dL (ref 30.0–36.0)
MCHC: 31 g/dL (ref 30.0–36.0)
MCHC: 31 g/dL (ref 30.0–36.0)
MCV: 89 fL (ref 80.0–100.0)
MCV: 88.3 fL (ref 80.0–100.0)
MCV: 88.3 fL (ref 80.0–100.0)
Platelets: 6 10*3/uL — CL (ref 150–400)
Platelets: 11 10*3/uL — CL (ref 150–400)
Platelets: 11 10*3/uL — CL (ref 150–400)
RBC: 2.81 MIL/uL — ABNORMAL LOW (ref 3.87–5.11)
RBC: 2.56 MIL/uL — ABNORMAL LOW (ref 3.87–5.11)
RBC: 2.56 MIL/uL — ABNORMAL LOW (ref 3.87–5.11)
RDW: 19.3 % — ABNORMAL HIGH (ref 11.5–15.5)
RDW: 19.5 % — ABNORMAL HIGH (ref 11.5–15.5)
RDW: 19.5 % — ABNORMAL HIGH (ref 11.5–15.5)
WBC: 1.2 10*3/uL — CL (ref 4.0–10.5)
WBC: 1.3 10*3/uL — CL (ref 4.0–10.5)
WBC: 1.3 10*3/uL — CL (ref 4.0–10.5)
nRBC: 4.3 % — ABNORMAL HIGH (ref 0.0–0.2)
nRBC: 2.3 % — ABNORMAL HIGH (ref 0.0–0.2)

## 2019-08-12 LAB — COMPREHENSIVE METABOLIC PANEL
ALT: 16 U/L (ref 0–44)
AST: 21 U/L (ref 15–41)
Albumin: 3.2 g/dL — ABNORMAL LOW (ref 3.5–5.0)
Alkaline Phosphatase: 89 U/L (ref 38–126)
Anion gap: 12 (ref 5–15)
BUN: 21 mg/dL (ref 8–23)
CO2: 21 mmol/L — ABNORMAL LOW (ref 22–32)
Calcium: 8.6 mg/dL — ABNORMAL LOW (ref 8.9–10.3)
Chloride: 105 mmol/L (ref 98–111)
Creatinine, Ser: 1.1 mg/dL — ABNORMAL HIGH (ref 0.44–1.00)
GFR calc Af Amer: 55 mL/min — ABNORMAL LOW (ref >60)
GFR calc non Af Amer: 47 mL/min — ABNORMAL LOW (ref >60)
Glucose, Bld: 265 mg/dL — ABNORMAL HIGH (ref 70–99)
Potassium: 4.2 mmol/L (ref 3.5–5.1)
Sodium: 138 mmol/L (ref 135–145)
Total Bilirubin: 1.2 mg/dL (ref 0.3–1.2)
Total Protein: 6.6 g/dL (ref 6.5–8.1)

## 2019-08-12 LAB — GLUCOSE, CAPILLARY
Glucose-Capillary: 177 mg/dL — ABNORMAL HIGH (ref 70–99)
Glucose-Capillary: 108 mg/dL — ABNORMAL HIGH (ref 70–99)
Glucose-Capillary: 115 mg/dL — ABNORMAL HIGH (ref 70–99)

## 2019-08-12 LAB — RESPIRATORY PANEL BY RT PCR (FLU A&B, COVID)
Influenza A by PCR: NEGATIVE
Influenza B by PCR: NEGATIVE
SARS Coronavirus 2 by RT PCR: NEGATIVE

## 2019-08-12 LAB — PREPARE RBC (CROSSMATCH)

## 2019-08-12 LAB — PROTIME-INR
INR: 1.2 (ref 0.8–1.2)
Prothrombin Time: 14.9 seconds (ref 11.4–15.2)

## 2019-08-12 LAB — APTT: aPTT: 26 seconds (ref 24–36)

## 2019-08-12 MED ORDER — LEVOTHYROXINE SODIUM 50 MCG PO TABS
50.0000 ug | ORAL_TABLET | Freq: Every day | ORAL | Status: DC
  Administered 2019-08-13 – 2019-08-15 (×3): 50 ug via ORAL
  Filled 2019-08-12 (×3): qty 1

## 2019-08-12 MED ORDER — CIPROFLOXACIN HCL 500 MG PO TABS
500.0000 mg | ORAL_TABLET | Freq: Two times a day (BID) | ORAL | Status: DC
  Administered 2019-08-12 – 2019-08-15 (×6): 500 mg via ORAL
  Filled 2019-08-12 (×6): qty 1

## 2019-08-12 MED ORDER — ONDANSETRON HCL 4 MG/2ML IJ SOLN: 4.0000 mg | Freq: Four times a day (QID) | INTRAMUSCULAR | Status: DC | PRN

## 2019-08-12 MED ORDER — LOPERAMIDE HCL 2 MG PO CAPS: 2.0000 mg | ORAL_CAPSULE | ORAL | Status: DC | PRN

## 2019-08-12 MED ORDER — AMINOCAPROIC ACID 500 MG PO TABS
500.0000 mg | ORAL_TABLET | Freq: Four times a day (QID) | ORAL | Status: DC
  Administered 2019-08-12 – 2019-08-15 (×11): 500 mg via ORAL
  Filled 2019-08-12 (×14): qty 1

## 2019-08-12 MED ORDER — VALACYCLOVIR HCL 500 MG PO TABS
500.0000 mg | ORAL_TABLET | Freq: Two times a day (BID) | ORAL | Status: DC
  Administered 2019-08-12 – 2019-08-15 (×6): 500 mg via ORAL
  Filled 2019-08-12 (×8): qty 1

## 2019-08-12 MED ORDER — SODIUM CHLORIDE 0.9 % IV SOLN
10.0000 mL/h | Freq: Once | INTRAVENOUS | Status: AC
  Administered 2019-08-12: 10 mL/h via INTRAVENOUS

## 2019-08-12 MED ORDER — SODIUM CHLORIDE 0.9 % IV SOLN: INTRAVENOUS | Status: DC

## 2019-08-12 MED ORDER — HYDRALAZINE HCL 25 MG PO TABS: 25.0000 mg | ORAL_TABLET | Freq: Three times a day (TID) | ORAL | Status: DC | PRN

## 2019-08-12 MED ORDER — ACETAMINOPHEN 650 MG RE SUPP: 650.0000 mg | Freq: Four times a day (QID) | RECTAL | Status: DC | PRN

## 2019-08-12 MED ORDER — SODIUM CHLORIDE 0.9% IV SOLUTION
Freq: Once | INTRAVENOUS | Status: AC
  Filled 2019-08-12: qty 250

## 2019-08-12 MED ORDER — SULFASALAZINE 500 MG PO TBEC
500.0000 mg | DELAYED_RELEASE_TABLET | Freq: Every day | ORAL | Status: DC
  Administered 2019-08-12 – 2019-08-14 (×3): 500 mg via ORAL
  Filled 2019-08-12 (×4): qty 1

## 2019-08-12 MED ORDER — NYSTATIN 100000 UNIT/GM EX POWD
1.0000 "application " | Freq: Two times a day (BID) | CUTANEOUS | Status: DC | PRN
  Filled 2019-08-12: qty 15

## 2019-08-12 MED ORDER — FLUCONAZOLE 100 MG PO TABS
200.0000 mg | ORAL_TABLET | Freq: Every day | ORAL | Status: DC
  Administered 2019-08-13 – 2019-08-15 (×3): 200 mg via ORAL
  Filled 2019-08-12 (×4): qty 2

## 2019-08-12 MED ORDER — ACETAMINOPHEN 325 MG PO TABS
650.0000 mg | ORAL_TABLET | Freq: Four times a day (QID) | ORAL | Status: DC | PRN
  Administered 2019-08-14 – 2019-08-15 (×2): 650 mg via ORAL
  Filled 2019-08-12 (×2): qty 2

## 2019-08-12 MED ORDER — INSULIN ASPART PROT & ASPART (70-30 MIX) 100 UNIT/ML ~~LOC~~ SUSP
5.0000 [IU] | Freq: Two times a day (BID) | SUBCUTANEOUS | Status: DC
  Administered 2019-08-12 – 2019-08-15 (×6): 5 [IU] via SUBCUTANEOUS
  Filled 2019-08-12 (×6): qty 10

## 2019-08-12 MED ORDER — PANTOPRAZOLE SODIUM 40 MG PO TBEC
40.0000 mg | DELAYED_RELEASE_TABLET | Freq: Two times a day (BID) | ORAL | Status: DC
  Administered 2019-08-12 – 2019-08-14 (×5): 40 mg via ORAL
  Filled 2019-08-12 (×5): qty 1

## 2019-08-12 MED ORDER — ONDANSETRON HCL 4 MG PO TABS: 4.0000 mg | ORAL_TABLET | Freq: Four times a day (QID) | ORAL | Status: DC | PRN

## 2019-08-12 MED ORDER — INSULIN ASPART 100 UNIT/ML ~~LOC~~ SOLN
0.0000 [IU] | SUBCUTANEOUS | Status: DC
  Administered 2019-08-13: 5 [IU] via SUBCUTANEOUS
  Administered 2019-08-13: 3 [IU] via SUBCUTANEOUS
  Administered 2019-08-14: 1 [IU] via SUBCUTANEOUS
  Administered 2019-08-14: 5 [IU] via SUBCUTANEOUS
  Administered 2019-08-14: 3 [IU] via SUBCUTANEOUS
  Administered 2019-08-14: 2 [IU] via SUBCUTANEOUS
  Administered 2019-08-14: 1 [IU] via SUBCUTANEOUS
  Administered 2019-08-14 – 2019-08-15 (×3): 2 [IU] via SUBCUTANEOUS
  Administered 2019-08-15: 1 [IU] via SUBCUTANEOUS
  Administered 2019-08-15: 2 [IU] via SUBCUTANEOUS
  Filled 2019-08-12 (×11): qty 1

## 2019-08-12 MED ORDER — VITAMIN B-12 1000 MCG PO TABS
1000.0000 ug | ORAL_TABLET | Freq: Every day | ORAL | Status: DC
  Administered 2019-08-13 – 2019-08-15 (×3): 1000 ug via ORAL
  Filled 2019-08-12 (×3): qty 1

## 2019-08-12 NOTE — Telephone Encounter (Signed)
Patient called regarding bright red blood per rectum this morning.  Patient with high-grade MDS-recent platelet count on 1/28-10; hemoglobin 6.8 status post 1 transfusion in ER.   I would recommend going to the emergency room;.  Patient requesting a phone call from her primary oncologist/Dr. Tasia Catchings.  Request call her daughter [phone]. Will inform Dr.Yu.

## 2019-08-12 NOTE — Consult Note (Signed)
Yesenia Darby, MD [address]  [place]  [place], [place] [address]  Main: [phone]  Fax: [phone] Pager: [phone]   Consultation  Referring Provider:     No ref. provider found Primary Care Physician:  Glean Hess, MD Primary Gastroenterologist:  Dr. Vicente Males      Reason for Consultation: Rectal bleeding  Date of Admission:  08/12/2019 Date of Consultation:  08/12/2019         HPI:   Yesenia Hart is a 79 y.o. female history of MDS, was originally seen by Dr. Vicente Males in 03/2019 to evaluate for rectal bleeding.  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  She also has a history of colitis diagnosed in [place], maintained on sulfasalazine.  At that time, Dr. Vicente Males discussed with her about endoscopic evaluation but patient deferred at that time.  She did not have follow-up with GI since then.  Patient has intermittent episodes of rectal bleeding, on Amicar.  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 Patient is evaluated by [facility] hematology on 08/06/2019 and she does not want to be in clinical trial at this time Patient is a good historian and she reports her baseline bowel movements about 2 which are loose. She reports that it was on wiping and in the toilet bowel.  Denies any blood clots.  She took Amicar this morning.  She denies abdominal pain, nausea or vomiting  NSAIDs: None  Antiplts/Anticoagulants/Anti thrombotics: None  GI Procedures: EGD and colonoscopy in 08/2017 History of AVMs in stomach and colon  Past Medical History:  Diagnosis Date  . Anemia   . B12 deficiency 06/10/2018  . Clotting disorder ([place])   .  Diabetes mellitus without complication ([place])   . IBS (irritable bowel syndrome)   . Iron deficiency anemia due to chronic blood loss 12/12/2017  . MDS (myelodysplastic syndrome) ([place])   . MDS (myelodysplastic syndrome) ([place])   . Thyroid disease     Past Surgical History:  Procedure Laterality Date  . APPENDECTOMY    . breast biopsy 70'    . CHOLECYSTECTOMY    . CORONARY ANGIOPLASTY WITH STENT PLACEMENT  2016   in [place]    Prior to Admission medications   Medication Sig Start Date End Date Taking? Authorizing Provider  aminocaproic acid (AMICAR) 500 MG tablet Take 1 tablet (500 mg total) by mouth every 6 (six) hours. 07/25/19  Yes Earlie Server, MD  ciprofloxacin (CIPRO) 500 MG tablet Take 1 tablet (500 mg total) by mouth 2 (two) times daily. 07/31/19  Yes Earlie Server, MD  Cyanocobalamin (GNP VITAMIN B-12) 1000 MCG TBCR Take 1 tablet (1,000 mcg total) by mouth daily. 07/31/19  Yes Earlie Server, MD  fluconazole (DIFLUCAN) 200 MG tablet Take 200 mg by mouth daily. 08/06/19  Yes [provider]  furosemide (LASIX) 20 MG tablet TAKE 1 TABLET BY MOUTH ONCE DAILY Patient taking differently: Take 20 mg by mouth daily.  06/29/19  Yes Glean Hess, MD  glimepiride (AMARYL) 4 MG tablet TAKE 1 TABLET BY MOUTH ONCE DAILY Patient taking differently: Take 4 mg by mouth daily.  05/28/19  Yes Halina Maidens  H, MD  Insulin Pen Needle (ULTICARE MICRO PEN NEEDLES) 32G X 4 MM MISC Inject 1 each into the skin 2 (two) times daily. 08/05/19  Yes Glean Hess, MD  Lancets [facility] ULTRASOFT) lancets USE AS DIRECTED. 06/29/19  Yes Glean Hess, MD  levothyroxine (SYNTHROID) 50 MCG tablet TAKE 1 TABLET BY MOUTH ONCE DAILY ON AN EMPTY STOMACH. WAIT 30 MINUTES BEFORE TAKING OTHER MEDS. Patient taking differently: Take 50 mcg by mouth daily before breakfast.  04/22/19  Yes Glean Hess, MD  loperamide (IMODIUM) 2 MG capsule Take 2 mg by mouth as needed for diarrhea or loose stools.   Yes [provider]  NOVOLOG MIX 70/30 FLEXPEN (70-30) 100 UNIT/ML FlexPen Inject 0.08 mLs (8 Units total) into the skin 2 (two) times daily with a meal. 12/26/18  Yes Glean Hess, MD  nystatin (MYCOSTATIN/NYSTOP) powder Apply topically 2 (two) times daily as needed. Patient taking differently: Apply 1 application topically 2 (two) times daily as needed (rash).  07/31/19  Yes Earlie Server, MD  ondansetron (ZOFRAN) 8 MG tablet TAKE 1 TABLET BY MOUTH TWICE DAILY AS NEEDED NAUSEA AND VOMITING Patient taking differently: Take 8 mg by mouth every 8 (eight) hours as needed for nausea or vomiting.  07/25/18  Yes Earlie Server, MD  [facility] ULTRA test strip TEST TWICE DAILY 04/19/19  Yes Glean Hess, MD  pantoprazole (PROTONIX) 40 MG tablet TAKE 1 TABLET BY MOUTH ONCE DAILY Patient taking differently: Take 40 mg by mouth daily.  07/14/19  Yes Glean Hess, MD  quinapril (ACCUPRIL) 20 MG tablet TAKE 1 TABLET BY MOUTH ONCE DAILY Patient taking differently: Take 20 mg by mouth daily.  06/04/19  Yes Glean Hess, MD  sulfaSALAzine (AZULFIDINE) 500 MG tablet TAKE 1 TABLET BY MOUTH AT BEDTIME Patient taking differently: Take 500 mg by mouth at bedtime.  05/16/19  Yes Glean Hess, MD  valACYclovir (VALTREX) 500 MG tablet Take 1 tablet (500 mg total) by mouth 2 (two) times daily. 07/31/19  Yes Earlie Server, MD  atorvastatin (LIPITOR) 20 MG tablet TAKE 1 TABLET BY MOUTH ONCE DAILY Patient taking differently: Take 20 mg by mouth at bedtime.  05/22/19   Glean Hess, MD    Current Facility-Administered Medications:  .  0.9 %  sodium chloride infusion, , Intravenous, Continuous, Ivor Costa, MD, Last Rate: 75 mL/hr at 08/12/19 1610, New Bag at 08/12/19 1828 .  acetaminophen (TYLENOL) tablet 650 mg, 650 mg, Oral, Q6H PRN **OR** acetaminophen (TYLENOL) suppository 650 mg, 650 mg, Rectal, Q6H PRN, Ivor Costa, MD .  aminocaproic acid (AMICAR) tablet 500 mg, 500 mg, Oral, Q6H, Ivor Costa, MD .  ciprofloxacin  (CIPRO) tablet 500 mg, 500 mg, Oral, BID, Ivor Costa, MD .  Derrill Memo ON 08/13/2019] fluconazole (DIFLUCAN) tablet 200 mg, 200 mg, Oral, Daily, Ivor Costa, MD .  insulin aspart (novoLOG) injection 0-9 Units, 0-9 Units, Subcutaneous, Q4H, Ivor Costa, MD .  insulin aspart protamine- aspart (NOVOLOG MIX 70/30) injection 5 Units, 5 Units, Subcutaneous, BID WC, Ivor Costa, MD, 5 Units at 08/12/19 1840 .  [START ON 08/13/2019] levothyroxine (SYNTHROID) tablet 50 mcg, 50 mcg, Oral, QAC breakfast, Ivor Costa, MD .  loperamide (IMODIUM) capsule 2 mg, 2 mg, Oral, PRN, Ivor Costa, MD .  nystatin (MYCOSTATIN/NYSTOP) topical powder 1 application, 1 application, Topical, BID PRN, Ivor Costa, MD .  ondansetron (ZOFRAN) tablet 4 mg, 4 mg, Oral, Q6H PRN **OR** ondansetron (ZOFRAN) injection 4 mg, 4 mg, Intravenous, Q6H  PRN, Ivor Costa, MD .  pantoprazole (PROTONIX) EC tablet 40 mg, 40 mg, Oral, BID, Ivor Costa, MD, 40 mg at 08/12/19 1302 .  sulfaSALAzine (AZULFIDINE) EC tablet 500 mg, 500 mg, Oral, QHS, Niu, Xilin, MD .  valACYclovir (VALTREX) tablet 500 mg, 500 mg, Oral, BID, Ivor Costa, MD .  Derrill Memo ON 08/13/2019] vitamin B-12 (CYANOCOBALAMIN) tablet 1,000 mcg, 1,000 mcg, Oral, Daily, Ivor Costa, MD   Family History  Adopted: Yes  Family history unknown: Yes     Social History   Tobacco Use  . Smoking status: Former Smoker    Packs/day: 1.00    Types: Cigarettes    Quit date: 1999    Years since quitting: 22.1  . Smokeless tobacco: Never Used  Substance Use Topics  . Alcohol use: Not Currently  . Drug use: Never    Allergies as of 08/12/2019 - Review Complete 08/12/2019  Allergen Reaction Noted  . Meperidine Nausea And Vomiting 11/28/2017  . Pioglitazone Swelling 11/28/2017  . Acarbose Diarrhea 11/28/2017    Review of Systems:    All systems reviewed and negative except where noted in HPI.   Physical Exam:  Vital signs in last 24 hours: Temp:  [98.1 F (36.7 C)-98.5 F (36.9 C)] 98.2 F (36.8  C) (02/02 1739) Pulse Rate:  [65-94] 65 (02/02 1739) Resp:  [14-30] 18 (02/02 1739) BP: (106-156)/(43-93) 156/50 (02/02 1739) SpO2:  [97 %-100 %] 100 % (02/02 1739) Weight:  [75.3 kg-77.1 kg] 77.1 kg (02/02 1739) Last BM Date: 08/12/19 General:   Pleasant, cooperative in NAD Head:  Normocephalic and atraumatic. Eyes:   No icterus.   Conjunctiva pale. PERRLA. Ears:  Normal auditory acuity. Neck:  Supple; no masses or thyroidomegaly Lungs: Respirations even and unlabored. Lungs clear to auscultation bilaterally.   No wheezes, crackles, or rhonchi.  Heart:  Regular rate and rhythm;  Without murmur, clicks, rubs or gallops Abdomen:  Soft, nondistended, nontender. Normal bowel sounds. No appreciable masses or hepatomegaly.  No rebound or guarding.  Rectal:  Not performed. Msk:  Symmetrical without gross deformities.   Extremities:  Without edema, cyanosis or clubbing. Neurologic:  Alert and oriented x3;  grossly normal neurologically. Skin:  Intact without significant lesions or rashes. Psych:  Alert and cooperative. Normal affect.  LAB RESULTS: CBC Latest Ref Rng & Units 08/12/2019 08/12/2019 08/12/2019  WBC 4.0 - 10.5 K/uL 1.3(LL) 1.3(LL) 1.2(LL)  Hemoglobin 12.0 - 15.0 g/dL 7.0(L) 7.0(L) 7.6(L)  Hematocrit 36.0 - 46.0 % 22.6(L) 22.6(L) 25.0(L)  Platelets 150 - 400 K/uL 11(LL) 11(LL) 6(LL)    BMET BMP Latest Ref Rng & Units 08/12/2019 08/07/2019 07/31/2019  Glucose 70 - 99 mg/dL 265(H) 366(H) 318(H)  BUN 8 - 23 mg/dL 21 19 19   Creatinine 0.44 - 1.00 mg/dL 1.10(H) 1.18(H) 1.34(H)  Sodium 135 - 145 mmol/L 138 135 137  Potassium 3.5 - 5.1 mmol/L 4.2 4.2 4.3  Chloride 98 - 111 mmol/L 105 102 104  CO2 22 - 32 mmol/L 21(L) 23 22  Calcium 8.9 - 10.3 mg/dL 8.6(L) 8.5(L) 8.6(L)    LFT Hepatic Function Latest Ref Rng & Units 08/12/2019 07/31/2019 06/23/2019  Total Protein 6.5 - 8.1 g/dL 6.6 6.5 6.5  Albumin 3.5 - 5.0 g/dL 3.2(L) 3.3(L) 3.4(L)  AST 15 - 41 U/L 21 17 17   ALT 0 - 44 U/L 16 12 13     Alk Phosphatase 38 - 126 U/L 89 93 113  Total Bilirubin 0.3 - 1.2 mg/dL 1.2 1.0 0.7     STUDIES:  No results found.    Impression / Plan:   Yesenia Hart is a 79 y.o. female with history of MDS, high risk with progression, known history of gastric and colonic AVMs, intermittent episodes of rectal bleeding, on Amicar as needed presented to ER after painless rectal bleeding x2 in setting of severe thrombocytopenia.  Patient is receiving platelet transfusion.  She also has history of colitis currently maintained on sulfasalazine, denies any flareups other than intermittent rectal bleeding.  Differentials of rectal bleeding include either secondary to colitis or hemorrhoidal bleeding or less likely diverticular bleed.  I have extensively discussed with her about colonoscopy or flexible sigmoidoscopy to evaluate the etiology such as flareup of colitis which might help to step up therapy if needed.  I also discussed with her if there is a large polyp then I will not be able to resect it in setting of severe thrombocytopenia due to high risk for post polypectomy bleeding.  She may also have colonic AVMs.  However, patient is not interested to undergo any endoscopic evaluation at this time  Conservative management per the primary team GI will sign off at this time, please call us back with questions or concerns  Thank you for involving me in the care of this patient.      LOS: 0 days   Sherri Sear, MD  08/12/2019, 6:45 PM   Note: This dictation was prepared with Dragon dictation along with smaller phrase technology. Any transcriptional errors that result from this process are unintentional.

## 2019-08-12 NOTE — ED Notes (Signed)
Full rainbow and type and screen drawn and sent to lab

## 2019-08-12 NOTE — H&P (Signed)
History and Physical    Yesenia Hart WCH:[id number] DOB: Jan 23, 1941 DOA: 08/12/2019  Referring MD/NP/PA:   PCP: Glean Hess, MD   Patient coming from:  The patient is coming from home.  At baseline, pt is independent for most of ADL.        Chief Complaint: Rectal bleeding  HPI: Yesenia Hart is a 79 y.o. 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  Per her daughter (I called her daughter by phone), patient had 2 episode of bright red rectal bleeding today.  Patient does not have nausea vomiting or abdominal pain.  She has history of MDS.  She had 1 unit of blood transfusion on Thursday per her daughter.  Denies chest pain, shortness breath, cough, fever or chills.  No symptoms of UTI.  Daughter states that patient injured her left wrist on Thursday night, imaging did not show fracture.  Patient still has some pain and swelling in the left wrist. Pt is on multiple antibiotics for prophylaxis (Cipro, Diflucan, Valtrex).  ED Course: pt was found to have pancytopenia with WBC 1.2, Hgb 7.6 and platelets 6 (her WBC was 1.5, hemoglobin 6.7 and platelet 10.0 on 1/28), negative COVID-19 PCR, stable renal function, temperature 98.4, blood pressure 130/47, heart rate 73, oxygen saturation 97% on room air.  Patient is admitted to [place] bed as inpatient.  Review of Systems:   General: no fevers, chills, no body weight gain, has poor appetite, has fatigue HEENT: no blurry vision, hearing changes or sore throat Respiratory: no dyspnea, coughing, wheezing CV: no chest pain, no palpitations GI: no nausea, vomiting, abdominal pain, diarrhea, constipation. Has rectal bleeding. GU: no dysuria, burning on urination, increased urinary frequency, hematuria  Ext:  no leg edema Neuro: no unilateral weakness, numbness, or tingling, no vision change or hearing loss Skin: no rash, no skin tear. MSK: has left wrist pain and swelling Heme: No easy bruising.  Travel history: No recent long distant travel.  Allergy:  Allergies  Allergen Reactions  . Meperidine Nausea And Vomiting  . Pioglitazone Swelling    Other reaction(s): Edema  . Acarbose Diarrhea    Past Medical History:  Diagnosis Date  . Anemia   . B12 deficiency 06/10/2018  . Clotting disorder ([place])   . Diabetes mellitus without complication ([place])   . IBS (irritable bowel syndrome)   . Iron deficiency anemia due to chronic blood loss 12/12/2017  . MDS (myelodysplastic syndrome) ([place])   . MDS (myelodysplastic syndrome) ([place])   . Thyroid disease     Past Surgical History:  Procedure Laterality Date  . APPENDECTOMY    . breast biopsy 72'    . CHOLECYSTECTOMY    . CORONARY ANGIOPLASTY WITH STENT PLACEMENT  2016   in [place]    Social History:  reports that she quit smoking about 22 years ago. Her smoking use included cigarettes. She smoked 1.00 pack per day. She has never used smokeless tobacco. She reports previous alcohol use. She reports that she does not use drugs.  Family History:  Family History  Adopted: Yes  Family history unknown: Yes     Prior to Admission medications   Medication Sig Start Date End Date Taking? Authorizing Provider  aminocaproic acid (AMICAR) 500 MG tablet Take 1 tablet (500 mg total) by mouth every 6 (six) hours. 07/25/19  Yes Earlie Server, MD  ciprofloxacin (CIPRO) 500 MG tablet Take 1 tablet (500 mg total) by mouth 2 (two) times daily. 07/31/19  Yes Earlie Server, MD  Cyanocobalamin (GNP VITAMIN B-12) 1000 MCG TBCR Take 1 tablet (1,000 mcg total) by mouth daily. 07/31/19  Yes Earlie Server, MD  fluconazole (DIFLUCAN) 200 MG tablet Take 200 mg by mouth daily. 08/06/19  Yes [provider]  furosemide (LASIX) 20 MG tablet TAKE 1 TABLET BY MOUTH ONCE DAILY  06/29/19  Yes Glean Hess, MD  glimepiride (AMARYL) 4 MG tablet TAKE 1 TABLET BY MOUTH ONCE DAILY 05/28/19  Yes Glean Hess, MD  levothyroxine (SYNTHROID) 50 MCG tablet TAKE 1 TABLET BY MOUTH ONCE DAILY ON AN EMPTY STOMACH. WAIT 30 MINUTES BEFORE TAKING OTHER MEDS. 04/22/19  Yes Glean Hess, MD  NOVOLOG MIX 70/30 FLEXPEN (70-30) 100 UNIT/ML FlexPen Inject 0.08 mLs (8 Units total) into the skin 2 (two) times daily with a meal. 12/26/18  Yes Glean Hess, MD  pantoprazole (PROTONIX) 40 MG tablet TAKE 1 TABLET BY MOUTH ONCE DAILY 07/14/19  Yes Glean Hess, MD  quinapril (ACCUPRIL) 20 MG tablet TAKE 1 TABLET BY MOUTH ONCE DAILY 06/04/19  Yes Glean Hess, MD  sulfaSALAzine (AZULFIDINE) 500 MG tablet TAKE 1 TABLET BY MOUTH AT BEDTIME 05/16/19  Yes Glean Hess, MD  valACYclovir (VALTREX) 500 MG tablet Take 1 tablet (500 mg total) by mouth 2 (two) times daily. 07/31/19  Yes Earlie Server, MD  atorvastatin (LIPITOR) 20 MG tablet TAKE 1 TABLET BY MOUTH ONCE DAILY 05/22/19   Glean Hess, MD  Insulin Pen Needle (ULTICARE MICRO PEN NEEDLES) 32G X 4 MM MISC Inject 1 each into the skin 2 (two) times daily. 08/05/19   Glean Hess, MD  Lancets [facility] ULTRASOFT) lancets USE AS DIRECTED. 06/29/19   Glean Hess, MD  loperamide (IMODIUM) 2 MG capsule Take 2 mg by mouth as needed for diarrhea or loose stools.    [provider]  nystatin (MYCOSTATIN/NYSTOP) powder Apply topically 2 (two) times daily as needed. 07/31/19   Earlie Server, MD  ondansetron (ZOFRAN) 8 MG tablet TAKE 1 TABLET BY MOUTH TWICE DAILY AS NEEDED NAUSEA AND VOMITING 07/25/18   Earlie Server, MD  [facility] ULTRA test strip TEST TWICE DAILY 04/19/19   Glean Hess, MD    Physical Exam: Vitals:   08/12/19 1647 08/12/19 1700 08/12/19 1711 08/12/19 1739  BP: 128/60 (!) 133/59 (!) 133/59 (!) 156/50  Pulse: 76 67 68 65  Resp: (!) 21 16 19 18   Temp: 98.5 F (36.9 C)  98.1 F (36.7 C) 98.2 F (36.8  C)  TempSrc: Oral  Oral Oral  SpO2: 100% 100% 100% 100%  Weight:    77.1 kg  Height:    5' 4"  (1.626 m)   General: Not in acute distress. Pale looking. HEENT:       Eyes: PERRL, EOMI, no scleral icterus.       ENT: No discharge from the ears and nose, no pharynx injection, no tonsillar enlargement.        Neck: No JVD, no bruit, no mass felt. Heme: No neck lymph node enlargement. Cardiac: S1/S2, RRR, No murmurs, No gallops or rubs. Respiratory: No rales, wheezing, rhonchi or rubs. GI: Soft, nondistended, nontender, no rebound pain, no organomegaly, BS present. GU: No hematuria Ext: No pitting leg edema bilaterally. 2+DP/PT pulse bilaterally. Musculoskeletal: has left wrist swelling and tenderness. Skin: No rashes.  Neuro: Alert,  oriented X3, cranial nerves II-XII grossly intact, moves all extremities. Psych: Patient is not psychotic, no suicidal or hemocidal ideation.  Labs on Admission: I have personally reviewed following labs and imaging studies  CBC: Recent Labs  Lab 08/07/19 1955 08/12/19 0914 08/12/19 1223 08/12/19 1636  WBC 1.5* 1.2* 1.3* 1.3*  HGB 6.7* 7.6* 7.0* 7.0*  HCT 23.3* 25.0* 22.6* 22.6*  MCV 90.0 89.0 88.3 88.3  PLT 10* 6* 11* 11*   Basic Metabolic Panel: Recent Labs  Lab 08/07/19 1955 08/12/19 0914  NA 135 138  K 4.2 4.2  CL 102 105  CO2 23 21*  GLUCOSE 366* 265*  BUN 19 21  CREATININE 1.18* 1.10*  CALCIUM 8.5* 8.6*   GFR: Estimated Creatinine Clearance: 41 mL/min (A) (by C-G formula based on SCr of 1.1 mg/dL (H)). Liver Function Tests: Recent Labs  Lab 08/12/19 0914  AST 21  ALT 16  ALKPHOS 89  BILITOT 1.2  PROT 6.6  ALBUMIN 3.2*   No results for input(s): LIPASE, AMYLASE in the last 168 hours. No results for input(s): AMMONIA in the last 168 hours. Coagulation Profile: Recent Labs  Lab 08/12/19 0914  INR 1.2   Cardiac Enzymes: No results for input(s): CKTOTAL, CKMB, CKMBINDEX, TROPONINI in the last 168 hours. BNP (last 3  results) No results for input(s): PROBNP in the last 8760 hours. HbA1C: No results for input(s): HGBA1C in the last 72 hours. CBG: Recent Labs  Lab 08/12/19 1351 08/12/19 1823  GLUCAP 177* 108*   Lipid Profile: No results for input(s): CHOL, HDL, LDLCALC, TRIG, CHOLHDL, LDLDIRECT in the last 72 hours. Thyroid Function Tests: No results for input(s): TSH, T4TOTAL, FREET4, T3FREE, THYROIDAB in the last 72 hours. Anemia Panel: No results for input(s): VITAMINB12, FOLATE, FERRITIN, TIBC, IRON, RETICCTPCT in the last 72 hours. Urine analysis:    Component Value Date/Time   COLORURINE YELLOW (A) 03/13/2018 1024   APPEARANCEUR CLEAR (A) 03/13/2018 1024   LABSPEC 1.006 03/13/2018 1024   PHURINE 6.0 03/13/2018 1024   GLUCOSEU NEGATIVE 03/13/2018 1024   HGBUR NEGATIVE 03/13/2018 1024   BILIRUBINUR NEGATIVE 03/13/2018 1024   KETONESUR NEGATIVE 03/13/2018 1024   PROTEINUR NEGATIVE 03/13/2018 1024   NITRITE NEGATIVE 03/13/2018 1024   LEUKOCYTESUR NEGATIVE 03/13/2018 1024   Sepsis Labs: @LABRCNTIP (procalcitonin:4,lacticidven:4) ) Recent Results (from the past 240 hour(s))  Respiratory Panel by RT PCR (Flu A&B, Covid) - Nasopharyngeal Swab     Status: None   Collection Time: 08/12/19 10:51 AM   Specimen: Nasopharyngeal Swab  Result Value Ref Range Status   SARS Coronavirus 2 by RT PCR NEGATIVE NEGATIVE Final    Comment: (NOTE) SARS-CoV-2 target nucleic acids are NOT DETECTED. The SARS-CoV-2 RNA is generally detectable in upper respiratoy specimens during the acute phase of infection. The lowest concentration of SARS-CoV-2 viral copies this assay can detect is 131 copies/mL. A negative result does not preclude SARS-Cov-2 infection and should not be used as the sole basis for treatment or other patient management decisions. A negative result may occur with  improper specimen collection/handling, submission of specimen other than nasopharyngeal swab, presence of viral mutation(s)  within the areas targeted by this assay, and inadequate number of viral copies (<131 copies/mL). A negative result must be combined with clinical observations, patient history, and epidemiological information. The expected result is Negative. Fact Sheet for Patients:  PinkCheek.be Fact Sheet for Healthcare Providers:  GravelBags.it This test is not yet ap proved or cleared by the [place] FDA and  has been  authorized for detection and/or diagnosis of SARS-CoV-2 by FDA under an Emergency Use Authorization (EUA). This EUA will remain  in effect (meaning this test can be used) for the duration of the COVID-19 declaration under Section 564(b)(1) of the Act, 21 U.S.C. section 360bbb-3(b)(1), unless the authorization is terminated or revoked sooner.    Influenza A by PCR NEGATIVE NEGATIVE Final   Influenza B by PCR NEGATIVE NEGATIVE Final    Comment: (NOTE) The Xpert Xpress SARS-CoV-2/FLU/RSV assay is intended as an aid in  the diagnosis of influenza from Nasopharyngeal swab specimens and  should not be used as a sole basis for treatment. Nasal washings and  aspirates are unacceptable for Xpert Xpress SARS-CoV-2/FLU/RSV  testing. Fact Sheet for Patients: PinkCheek.be Fact Sheet for Healthcare Providers: GravelBags.it This test is not yet approved or cleared by the [place] FDA and  has been authorized for detection and/or diagnosis of SARS-CoV-2 by  FDA under an Emergency Use Authorization (EUA). This EUA will remain  in effect (meaning this test can be used) for the duration of the  Covid-19 declaration under Section 564(b)(1) of the Act, 21  U.S.C. section 360bbb-3(b)(1), unless the authorization is  terminated or revoked. Performed at [facility], [address] [place], [place] [address]      Radiological Exams on Admission: No results  found.   EKG:   Not done in ED, will get one.   Assessment/Plan Principal Problem:   GIB (gastrointestinal bleeding) Active Problems:   Essential hypertension   Hyperlipidemia associated with type 2 diabetes mellitus (HCC)   Type II diabetes mellitus with renal manifestations ([place])   Hypothyroidism due to acquired atrophy of thyroid   CAD (coronary artery disease), native coronary artery   Gastroesophageal reflux disease   MDS (myelodysplastic syndrome), high grade (HCC)   Pancytopenia (HCC)   CKD (chronic kidney disease), stage III    GIB (gastrointestinal bleeding): Patient has pancytopenia. WBC 1.2, Hgb 7.6 and platelets 6 (her WBC was 1.5, hemoglobin 6.7 and platelet 10.0 on 1/28). GI was consulted. Dr. Marius Ditch has singed off since pt does not want to do endoscopy.  - will admit to med-srug bed as inpt - continue Amicar - transfuse 1 unit of platelet and 1 unit of blood - IVF: 75 mL/hr - pantoprazole 40 mg bid - Zofran IV for nausea - Avoid NSAIDs and SQ heparin - Maintain IV access (2 large bore IVs if possible). - Monitor closely and follow q6h cbc, transfuse as necessary, if Hgb<7.0 - LaB: INR, PTT and type screen  Essential hypertension: -will hold lasix and Quinapril since pt is at risk of developing hypotension -As needed hydralazine  Hyperlipidemia associated with type 2 diabetes mellitus ([place]): -lipitor is on hold while pt is on Diflucan per her oncologist  Type II diabetes mellitus with renal manifestations ([place]): Most recent A1c 6.4 on 04/21/19, controled. Patient is taking Amaryl, 70/30 insulin at home -will decrease 70/30 insulin dose from  8 to 5 units bid -SSI  Hypothyroidism due to acquired atrophy of thyroid: -Synthroid  CAD (coronary artery disease), native coronary artery: s/p of stent -hold lipitor as above  Gastroesophageal reflux disease: -on protonix  MDS (myelodysplastic syndrome), high grade (HCC) and pancytopenia: Per her daughter,  patient stopped chemotherapy since she is no longer responding to treatment. -Pt is on multiple antibiotics for prophylaxis (Cipro, Diflucan, Valtrex). -f/u with Dr. Tasia Catchings of oncology  CKD (chronic kidney disease), stage IIIa: stable -f/u by [facility]    Inpatient status:  #  Patient requires inpatient status due to high intensity of service, high risk for further deterioration and high frequency of surveillance required.  I certify that at the point of admission it is my clinical judgment that the patient will require inpatient hospital care spanning beyond 2 midnights from the point of admission.  . 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 . Now patient has presenting with rectal bleeding . The worrisome physical exam findings include pale looking . The initial radiographic and laboratory data are worrisome because of pancytopenia . Current medical needs: please see my assessment and plan . Predictability of an adverse outcome (risk): Patient has multiple comorbidities as listed above. Now presents with rectal bleeding with pancytopenia Patient's presentation is highly complicated.  Patient is at high risk of deteriorating.  Will need to be treated in hospital for at least 2 days.      DVT ppx: SCD Code Status: DNR per her daughter Family Communication: es, patient's  Daughter by phone Disposition Plan:  Anticipate discharge back to previous home environment Consults called:  GI, Dr.Vanga Admission status: Med-surg bed as inpt     Date of Service 08/12/2019    [facility]   If 7PM-7AM, please contact night-coverage [email or web address] Password [email or web address] 08/12/2019, 6:30 PM

## 2019-08-12 NOTE — ED Provider Notes (Signed)
[facility] Emergency Department Provider Note       Time seen: ----------------------------------------- 9:30 AM on 08/12/2019 -----------------------------------------   I have reviewed the triage vital signs and the nursing notes.  HISTORY   Chief Complaint GI Bleeding   HPI Yesenia Hart is a 79 y.o. female with a history of anemia, B12 deficiency, clotting disorder, diabetes, IBS, myelodysplasia who presents to the ED for 2 bright red bloody stools today with a history of same.  Patient arrives alert and oriented, has no pain.  Patient reports recent blood transfusion.  Past Medical History:  Diagnosis Date  . Anemia   . B12 deficiency 06/10/2018  . Clotting disorder ([place])   . Diabetes mellitus without complication ([place])   . IBS (irritable bowel syndrome)   . Iron deficiency anemia due to chronic blood loss 12/12/2017  . MDS (myelodysplastic syndrome) ([place])   . MDS (myelodysplastic syndrome) ([place])   . Thyroid disease     Patient Active Problem List   Diagnosis Date Noted  . Neutropenia ([place]) 12/24/2018  . Anemia 12/24/2018  . MDS (myelodysplastic syndrome), high grade ([place]) 11/18/2018  . Gastroesophageal reflux disease 07/19/2018  . B12 deficiency 06/10/2018  . 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 CAD (coronary artery disease), native coronary artery 03/15/2018  . Drug-induced neutropenia ([place]) 01/14/2018  . Type II diabetes mellitus with complication ([place]) 16/04/9603  . Fever blister 12/17/2017  . Tobacco use disorder, moderate, in sustained remission 12/17/2017  . Thrombocytopenia ([place]) 12/12/2017  . Goals of care, counseling/discussion 12/12/2017  . Other fatigue 12/12/2017  . Asthma 11/30/2017  . Cataract 11/30/2017  . 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 Edema 11/30/2017  . Essential hypertension 11/30/2017  . Hyperlipidemia associated with type 2 diabetes mellitus ([place]) 11/30/2017  .  Osteoarthritis 11/30/2017  . 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 APPENDECTOMY    . breast biopsy 15'    . CHOLECYSTECTOMY    . CORONARY ANGIOPLASTY WITH STENT PLACEMENT  2016   in [place]    Allergies Acarbose, Demerol [meperidine hcl], Meperidine, and Pioglitazone  Social History Social History   Tobacco Use  . Smoking status: Former Smoker    Packs/day: 1.00    Types: Cigarettes    Quit date: 1999    Years since quitting: 22.1  . Smokeless tobacco: Never Used  Substance Use Topics  . Alcohol use: Not Currently  . Drug use: Never    Review of Systems Constitutional: Negative for fever. Cardiovascular: Negative for chest pain. Respiratory: Negative for shortness of breath. Gastrointestinal: Negative for abdominal pain, vomiting and diarrhea.  Positive for rectal bleeding Musculoskeletal: Negative for back pain. Skin: Negative for rash. Neurological: Negative for headaches, focal weakness or numbness.  All systems negative/normal/unremarkable except as stated in the HPI  ____________________________________________   PHYSICAL EXAM:  VITAL SIGNS: ED Triage Vitals  Enc Vitals Group     BP 08/12/19 0905 (!) 112/45     Pulse Rate 08/12/19 0905 94     Resp 08/12/19 0905 17     Temp 08/12/19 0905 98.4 F (36.9 C)     Temp Source 08/12/19 0905 Oral     SpO2 08/12/19 0905 99 %     Weight 08/12/19 0906 166 lb (75.3 kg)     Height 08/12/19 0906 5' 4"  (1.626 m)     Head Circumference --  Peak Flow --      Pain Score 08/12/19 0906 0     Pain Loc --      Pain Edu? --      Excl. in [place]? --     Constitutional: Alert and oriented.  Pale appearing, no distress Eyes: Conjunctivae are normal. Normal extraocular movements. ENT      Head: Normocephalic and atraumatic.      Nose: No congestion/rhinnorhea.      Mouth/Throat: Mucous membranes  are moist.      Neck: No stridor. Cardiovascular: Normal rate, regular rhythm. No murmurs, rubs, or gallops. Respiratory: Normal respiratory effort without tachypnea nor retractions. Breath sounds are clear and equal bilaterally. No wheezes/rales/rhonchi. Gastrointestinal: Soft and nontender. Normal bowel sounds Musculoskeletal: Nontender with normal range of motion in extremities. No lower extremity tenderness nor edema. Neurologic:  Normal speech and language. No gross focal neurologic deficits are appreciated.  Skin:  Skin is warm, dry and intact.  Pallor is noted Psychiatric: Mood and affect are normal. Speech and behavior are normal.  ____________________________________________  ED COURSE:  As part of my medical decision making, I reviewed the following data within the [facility] History obtained from family if available, nursing notes, old chart and ekg, as well as notes from prior ED visits. Patient presented for rectal bleeding, we will assess with labs and imaging as indicated at this time.   Procedures  Yesenia Hart was evaluated in Emergency Department on 08/12/2019 for the symptoms described in the history of present illness. She was evaluated in the context of the global COVID-19 pandemic, which necessitated consideration that the patient might be at risk for infection with the SARS-CoV-2 virus that causes COVID-19. Institutional protocols and algorithms that pertain to the evaluation of patients at risk for COVID-19 are in a state of rapid change based on information released by regulatory bodies including the CDC and federal and state organizations. These policies and algorithms were followed during the patient's care in the ED.  ____________________________________________   LABS (pertinent positives/negatives)  Labs Reviewed  COMPREHENSIVE METABOLIC PANEL - Abnormal; Notable for the following components:      Result Value   CO2 21 (*)    Glucose, Bld  265 (*)    Creatinine, Ser 1.10 (*)    Calcium 8.6 (*)    Albumin 3.2 (*)    GFR calc non Af Amer 47 (*)    GFR calc Af Amer 55 (*)    All other components within normal limits  CBC - Abnormal; Notable for the following components:   WBC 1.2 (*)    RBC 2.81 (*)    Hemoglobin 7.6 (*)    HCT 25.0 (*)    RDW 19.3 (*)    Platelets 6 (*)    nRBC 4.3 (*)    All other components within normal limits  PROTIME-INR  POC OCCULT BLOOD, ED  TYPE AND SCREEN  PREPARE PLATELET PHERESIS   CRITICAL CARE Performed by: Laurence Aly   Total critical care time: 30 minutes  Critical care time was exclusive of separately billable procedures and treating other patients.  Critical care was necessary to treat or prevent imminent or life-threatening deterioration.  Critical care was time spent personally by me on the following activities: development of treatment plan with patient and/or surrogate as well as nursing, discussions with consultants, evaluation of patient's response to treatment, examination of patient, obtaining history from patient or surrogate, ordering and performing treatments and interventions, ordering  and review of laboratory studies, ordering and review of radiographic studies, pulse oximetry and re-evaluation of patient's condition.  ____________________________________________   DIFFERENTIAL DIAGNOSIS   Rectal bleeding, coagulopathy, anemia, diverticulosis, colon cancer, myelodysplasia  FINAL ASSESSMENT AND PLAN  Rectal bleeding, myelodysplasia, thrombocytopenia, hyperglycemia   Plan: The patient had presented for passing a large amount of bright red blood per rectum. Patient's labs were as expected with pancytopenia and more concerning her thrombocytopenia was at 6000.  I have ordered IV platelet transfusion for her.  She may require blood transfusion as well.  Currently appears hemodynamically stable, I have discussed with GI and oncology.  I will discuss with the  hospitalist for admission.   Laurence Aly, MD    Note: This note was generated in part or whole with voice recognition software. Voice recognition is usually quite accurate but there are transcription errors that can and very often do occur. I apologize for any typographical errors that were not detected and corrected.     Earleen Newport, MD 08/12/19 1047

## 2019-08-12 NOTE — ED Triage Notes (Signed)
Pt c/o 2 bright red bloody stools today with a hx of MDS and low platelets. Pt Is a/ox4.

## 2019-08-12 NOTE — Telephone Encounter (Signed)
Daughter called and reported that patient had rectal bleeding episodes. Patient has MDS which has progressed, on supportive care. Patient presented to ER on 08/07/2019 and was found to have platelet count 10,000, hemoglobin 6.7. Cancer center has no known for blood transfusion today. I advised daughter to bring patient to ER. Called ER triage nurse and provided patient's information.

## 2019-08-13 ENCOUNTER — Encounter: Payer: Self-pay | Admitting: Oncology

## 2019-08-13 LAB — CBC
HCT: 26.5 % — ABNORMAL LOW (ref 36.0–46.0)
HCT: 24.8 % — ABNORMAL LOW (ref 36.0–46.0)
HCT: 26.9 % — ABNORMAL LOW (ref 36.0–46.0)
HCT: 25.4 % — ABNORMAL LOW (ref 36.0–46.0)
Hemoglobin: 8.2 g/dL — ABNORMAL LOW (ref 12.0–15.0)
Hemoglobin: 7.9 g/dL — ABNORMAL LOW (ref 12.0–15.0)
Hemoglobin: 8.4 g/dL — ABNORMAL LOW (ref 12.0–15.0)
Hemoglobin: 7.9 g/dL — ABNORMAL LOW (ref 12.0–15.0)
MCH: 27.7 pg (ref 26.0–34.0)
MCH: 28.6 pg (ref 26.0–34.0)
MCH: 28.1 pg (ref 26.0–34.0)
MCH: 28 pg (ref 26.0–34.0)
MCHC: 30.9 g/dL (ref 30.0–36.0)
MCHC: 31.9 g/dL (ref 30.0–36.0)
MCHC: 31.2 g/dL (ref 30.0–36.0)
MCHC: 31.1 g/dL (ref 30.0–36.0)
MCV: 89.5 fL (ref 80.0–100.0)
MCV: 89.9 fL (ref 80.0–100.0)
MCV: 90 fL (ref 80.0–100.0)
MCV: 90.1 fL (ref 80.0–100.0)
Platelets: 10 10*3/uL — CL (ref 150–400)
Platelets: 9 10*3/uL — CL (ref 150–400)
Platelets: 7 10*3/uL — CL (ref 150–400)
Platelets: 7 10*3/uL — CL (ref 150–400)
RBC: 2.96 MIL/uL — ABNORMAL LOW (ref 3.87–5.11)
RBC: 2.76 MIL/uL — ABNORMAL LOW (ref 3.87–5.11)
RBC: 2.99 MIL/uL — ABNORMAL LOW (ref 3.87–5.11)
RBC: 2.82 MIL/uL — ABNORMAL LOW (ref 3.87–5.11)
RDW: 17.8 % — ABNORMAL HIGH (ref 11.5–15.5)
RDW: 18.3 % — ABNORMAL HIGH (ref 11.5–15.5)
RDW: 18.2 % — ABNORMAL HIGH (ref 11.5–15.5)
RDW: 18.6 % — ABNORMAL HIGH (ref 11.5–15.5)
WBC: 1.5 10*3/uL — ABNORMAL LOW (ref 4.0–10.5)
WBC: 1.2 10*3/uL — CL (ref 4.0–10.5)
WBC: 1.5 10*3/uL — ABNORMAL LOW (ref 4.0–10.5)
WBC: 1.3 10*3/uL — CL (ref 4.0–10.5)
nRBC: 3.4 % — ABNORMAL HIGH (ref 0.0–0.2)
nRBC: 3.3 % — ABNORMAL HIGH (ref 0.0–0.2)
nRBC: 2 % — ABNORMAL HIGH (ref 0.0–0.2)
nRBC: 2.3 % — ABNORMAL HIGH (ref 0.0–0.2)

## 2019-08-13 LAB — GLUCOSE, CAPILLARY
Glucose-Capillary: 191 mg/dL — ABNORMAL HIGH (ref 70–99)
Glucose-Capillary: 216 mg/dL — ABNORMAL HIGH (ref 70–99)
Glucose-Capillary: 253 mg/dL — ABNORMAL HIGH (ref 70–99)
Glucose-Capillary: 85 mg/dL (ref 70–99)
Glucose-Capillary: 97 mg/dL (ref 70–99)
Glucose-Capillary: 98 mg/dL (ref 70–99)

## 2019-08-13 LAB — BPAM PLATELET PHERESIS
Blood Product Expiration Date: 202102042359
ISSUE DATE / TIME: 202102021211
Unit Type and Rh: 6200

## 2019-08-13 LAB — BASIC METABOLIC PANEL
Anion gap: 9 (ref 5–15)
BUN: 20 mg/dL (ref 8–23)
CO2: 23 mmol/L (ref 22–32)
Calcium: 8.1 mg/dL — ABNORMAL LOW (ref 8.9–10.3)
Chloride: 109 mmol/L (ref 98–111)
Creatinine, Ser: 0.85 mg/dL (ref 0.44–1.00)
GFR calc Af Amer: >60 mL/min (ref >60)
GFR calc non Af Amer: >60 mL/min (ref >60)
Glucose, Bld: 100 mg/dL — ABNORMAL HIGH (ref 70–99)
Potassium: 4.1 mmol/L (ref 3.5–5.1)
Sodium: 141 mmol/L (ref 135–145)

## 2019-08-13 LAB — PREPARE PLATELET PHERESIS: Unit division: 0

## 2019-08-13 LAB — HEMOGLOBIN A1C
Hgb A1c MFr Bld: 6.7 % — ABNORMAL HIGH (ref 4.8–5.6)
Mean Plasma Glucose: 145.59 mg/dL

## 2019-08-13 MED ORDER — GLUCERNA SHAKE PO LIQD
237.0000 mL | Freq: Three times a day (TID) | ORAL | Status: DC
  Administered 2019-08-13 – 2019-08-15 (×5): 237 mL via ORAL

## 2019-08-13 MED ORDER — OXYCODONE HCL 5 MG PO TABS
5.0000 mg | ORAL_TABLET | Freq: Four times a day (QID) | ORAL | Status: DC | PRN
  Administered 2019-08-13 – 2019-08-15 (×4): 5 mg via ORAL
  Filled 2019-08-13 (×4): qty 1

## 2019-08-13 MED ORDER — SODIUM CHLORIDE 0.9% IV SOLUTION: Freq: Once | INTRAVENOUS | Status: AC

## 2019-08-13 MED ORDER — ADULT MULTIVITAMIN W/MINERALS CH
1.0000 | ORAL_TABLET | Freq: Every day | ORAL | Status: DC
  Administered 2019-08-14 – 2019-08-15 (×2): 1 via ORAL
  Filled 2019-08-13 (×2): qty 1

## 2019-08-13 NOTE — Progress Notes (Signed)
Initial Nutrition Assessment  DOCUMENTATION CODES:   Not applicable  INTERVENTION:  Glucerna Shake po TID, each supplement provides 220 kcal and 10 grams of protein (strawberry)  MVI daily  NUTRITION DIAGNOSIS:   Inadequate oral intake related to altered GI function, early satiety(GIB, hisotry of AVMs in colon and stomach) as evidenced by per patient/family report, energy intake < 75% for > 7 days.    GOAL:   Patient will meet greater than or equal to 90% of their needs    MONITOR:   PO intake, Supplement acceptance, Labs, I & O's, Weight trends  REASON FOR ASSESSMENT:   Malnutrition Screening Tool    ASSESSMENT:  RD working remotely.  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 In ED, pt found to have pancytopenia with WBC 1.2, Hgb 7.6 and admitted for GIB.   Pt evaluated by GI, per notes pt is not interested in any endoscopic evaluation at this time.   Patient eating 50% x 1 documented meal s/p HH/CM diet advancement this morning.   Spoke with patient via phone this afternoon. She endorses poor po intake at home over the last few weeks. She stated that she would eat 3 meals/day as she typically would, but after a few bites of food, she felt full and the food did not taste good to her. Today, she reports improvements to appetite recalling 1/2 chx salad sandwich, angel food cake, and finished the applesauce leftover from breakfast with diet gingerale for lunch today. Patient reports that she has already placed her dinner order for this evening and recalls [place] with gravy, mac and chz, carrots, and orange sherbet.   RD will provide nutrition supplement during admission given report of poor oral intake  over the past 2-3 weeks, pt amenable to strawberry flavor.   Noted non-pitting LUE edema per RN flowsheet.  Current wt 163.68 lbs Stable wt history over the past year per chart review  Medications reviewed and include: amicar, cipro, diflucan, ss novolog, novolog mix 70/30 5 units with meals, protonix, valtrex, B12  Labs: CBGs 076,22,63,33 x 24 hrs, WBC 1.5 (H), Hgb 8.4 (L) Lab Results  Component Value Date   HGBA1C 6.7 (H) 08/12/2019     NUTRITION - FOCUSED PHYSICAL EXAM: Unable to complete at this time, RD working remotely.  Diet Order:   Diet Order            Diet heart healthy/carb modified Room service appropriate? Yes; Fluid consistency: Thin  Diet effective now              EDUCATION NEEDS:   Education needs have been addressed  Skin:  Skin Assessment: Reviewed RN Assessment  Last BM:  2/2  Height:   Ht Readings from Last 1 Encounters:  08/12/19 _0  (1.626 m)    Weight:   Wt Readings from Last 1 Encounters:  08/13/19 74.4 kg    Ideal Body Weight:  54.5 kg  BMI:  Body mass index is 28.15 kg/m.  Estimated Nutritional Needs:   Kcal:  1500-1700  Protein:  75-85  Fluid:  >/= 1.5 L/day    Lajuan Lines, RD, LDN Clinical Nutrition Office Telephone [phone] After Hours/Weekend Pager: [phone]

## 2019-08-13 NOTE — Progress Notes (Signed)
[facility] Progress Note  Patient: Yesenia Hart    MIW:[id number]  DOA: 08/12/2019     Date of Service: the patient was seen and examined on 08/13/2019  Chief Complaint  Patient presents with  . GI Bleeding   Brief hospital course: Nitzia Perren is a 79 y.o. 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  Per her daughter (I called her daughter by phone), patient had 2 episode of bright red rectal bleeding today.  Patient does not have nausea vomiting or abdominal pain.  She has history of MDS.  She had 1 unit of blood transfusion on Thursday per her daughter.  Denies chest pain, shortness breath, cough, fever or chills.  No symptoms of UTI.  Daughter states that patient injured her left wrist on Thursday night, imaging did not show fracture.  Patient still has some pain and swelling in the left wrist. Pt is on multiple antibiotics for prophylaxis (Cipro, Diflucan, Valtrex).  ED Course: pt was found to have pancytopenia with WBC 1.2, Hgb 7.6 and platelets 6 (her WBC was 1.5, hemoglobin 6.7 and platelet 10.0 on 1/28), negative COVID-19 PCR, stable renal function, temperature 98.4, blood pressure 130/47, heart rate 73, oxygen saturation 97% on room air.  Patient is admitted to [place] bed as inpatient.  Currently further plan is monitor CBC frequently and transfuse PRBC and platelets as needed Hematology consult tomorrow a.m. for further management of MDS  Assessment and Plan: Principal Problem:   GIB (gastrointestinal bleeding) Active Problems:   Essential hypertension   Hyperlipidemia associated with type 2 diabetes mellitus (HCC)   Type II diabetes mellitus with renal manifestations ([place])   Hypothyroidism due to acquired atrophy of thyroid  CAD (coronary artery disease), native coronary artery   Gastroesophageal reflux disease   MDS (myelodysplastic syndrome), high grade (HCC)   Pancytopenia (HCC)   CKD (chronic kidney disease), stage III    GIB (gastrointestinal bleeding): Patient has pancytopenia. WBC 1.2, Hgb 7.6 and platelets 6 (her WBC was 1.5, hemoglobin 6.7 and platelet 10.0 on 1/28). GI was consulted. Dr. Marius Ditch has singed off since pt does not want to do endoscopy.  - will admit to med-srug bed as inpt - continue Amicar - transfuse 1 unit of platelet and 1 unit of blood - IVF: 75 mL/hr - pantoprazole 40 mg bid - Zofran IV for nausea - Avoid NSAIDs and SQ heparin - Maintain IV access (2 large bore IVs if possible). - Monitor closely and follow q6h cbc, transfuse as necessary, if Hgb<7.0 - LaB: INR, PTT and type screen  Essential hypertension: -will hold lasix and Quinapril since pt is at risk of developing hypotension -As needed hydralazine  Hyperlipidemia associated with type 2 diabetes mellitus ([place]): -lipitor is on hold while pt is on Diflucan per her oncologist  Type II diabetes mellitus with renal manifestations ([place]): Most recent A1c 6.4 on 04/21/19, controled. Patient is taking Amaryl, 70/30 insulin at home -will decrease 70/30 insulin dose from  8 to 5 units bid -SSI  Hypothyroidism due to acquired atrophy of thyroid: -Synthroid  CAD (coronary artery disease), native coronary artery: s/p of stent -hold lipitor as above  Gastroesophageal reflux disease: -on protonix  MDS (myelodysplastic syndrome), high grade (HCC) and pancytopenia: Per her daughter, patient stopped chemotherapy since she is no longer responding to treatment. -Pt is  on multiple antibiotics for prophylaxis (Cipro, Diflucan, Valtrex). -f/u with Dr. Tasia Catchings of oncology  CKD (chronic kidney disease), stage IIIa: stable -f/u by BMPPrincipal Problem:   GIB (gastrointestinal bleeding) Active Problems:   Essential  hypertension   Hyperlipidemia associated with type 2 diabetes mellitus ([place])   Type II diabetes mellitus with renal manifestations ([place])   Hypothyroidism due to acquired atrophy of thyroid   CAD (coronary artery disease), native coronary artery   Gastroesophageal reflux disease   MDS (myelodysplastic syndrome), high grade (HCC)   Pancytopenia (HCC)   CKD (chronic kidney disease), stage III    GIB (gastrointestinal bleeding): Patient has pancytopenia. WBC 1.2, Hgb 7.6 and platelets 6 (her WBC was 1.5, hemoglobin 6.7 and platelet 10.0 on 1/28). GI was consulted. Dr. Marius Ditch has singed off since pt does not want to do endoscopy. - continue Amicar - transfuse 1 unit of platelet and 1 unit of blood - IVF: 75 mL/hr - pantoprazole 40 mg bid - Zofran IV for nausea - Avoid NSAIDs and SQ heparin - Maintain IV access (2 large bore IVs if possible). - Monitor closely and follow q6h cbc, transfuse as necessary, if Hgb<7.0 - LaB: INR, PTT and type screen 2/3 transfuse 1 unit of platelet  Essential hypertension: -will hold lasix and Quinapril since pt is at risk of developing hypotension -As needed hydralazine  Hyperlipidemia associated with type 2 diabetes mellitus ([place]): -lipitor is on hold while pt is on Diflucan per her oncologist  Type II diabetes mellitus with renal manifestations ([place]): Most recent A1c 6.4 on 04/21/19, controled. Patient is taking Amaryl, 70/30 insulin at home -will decrease 70/30 insulin dose from  8 to 5 units bid -SSI  Hypothyroidism due to acquired atrophy of thyroid: -Synthroid  CAD (coronary artery disease), native coronary artery: s/p of stent -hold lipitor as above  Gastroesophageal reflux disease: -on protonix  MDS (myelodysplastic syndrome), high grade (HCC) and pancytopenia: Per her daughter, patient stopped chemotherapy since she is no longer responding to treatment. -Pt is on multiple antibiotics for prophylaxis (Cipro, Diflucan,  Valtrex). -f/u with Dr. Tasia Catchings of oncology  CKD (chronic kidney disease), stage IIIa: stable -f/u by BMP   Body mass index is 28.15 kg/m.  Nutrition Problem: Inadequate oral intake Etiology: altered GI function, early satiety(GIB, hisotry of AVMs in colon and stomach) Interventions: Interventions: MVI, Glucerna shake   Diet: Heart healthy DVT Prophylaxis: SCD, pharmacological prophylaxis contraindicated due to GI bleeding   Advance goals of care discussion: DNR  Family Communication: family was no present at bedside, at the time of interview.  The pt provided permission to discuss medical plan with the family. Opportunity was given to ask question and all questions were answered satisfactorily.   Disposition:  Pt is from home, admitted with GI bleeding and pancytopenia, still has low platelet count, which precludes a safe discharge. Discharge to home, when GI bleeding stops and pancytopenia improved  Subjective: No overnight issues, patient is still complaining of left hand pain 8/10 which is all the time chronic osteoarthritis and carpal tunnel syndrome.  Presented with GI bleeding which is improving, denies any abdominal pain no nausea vomiting or diarrhea.  Patient denies any chest pain or palpitation no shortness of breath.  Physical Exam: General:  alert oriented to time, place, and person.  Appear in mild distress, affect appropriate Eyes: PERRLA ENT: Oral Mucosa Clear, moist  Neck: no JVD,  Cardiovascular: S1 and S2 Present, no Murmur,  Respiratory: good respiratory effort, Bilateral Air entry equal and  Decreased, No Crackles, no wheezes Abdomen: Bowel Sound present, Soft and no tenderness,  Skin: no rashes Extremities: no Pedal edema, no calf tenderness Neurologic: without any new focal findings and mental status, alert and oriented x3 Gait not checked due to patient safety concerns  Vitals:   08/13/19 0500 08/13/19 0501 08/13/19 0749 08/13/19 1139  BP:  136/64 (!)  137/53 (!) 138/54  Pulse:  82 76 79  Resp:  20 14 16   Temp:  98.6 F (37 C) 98.2 F (36.8 C) 98.8 F (37.1 C)  TempSrc:  Oral Oral Oral  SpO2:  96% 97% 99%  Weight: 74.4 kg     Height:        Intake/Output Summary (Last 24 hours) at 08/13/2019 1936 Last data filed at 08/13/2019 8032 Gross per 24 hour  Intake 2618.92 ml  Output 1400 ml  Net 1218.92 ml   Filed Weights   08/12/19 0906 08/12/19 1739 08/13/19 0500  Weight: 75.3 kg 77.1 kg 74.4 kg    Data Reviewed: I have personally reviewed and interpreted daily labs, tele strips, imagings as discussed above. I reviewed all nursing notes, pharmacy notes, vitals, pertinent old records I have discussed plan of care as described above with RN and patient/family.  CBC: Recent Labs  Lab 08/12/19 1636 08/13/19 0042 08/13/19 0629 08/13/19 1231 08/13/19 1846  WBC 1.3* 1.5* 1.2* 1.5* 1.3*  HGB 7.0* 8.2* 7.9* 8.4* 7.9*  HCT 22.6* 26.5* 24.8* 26.9* 25.4*  MCV 88.3 89.5 89.9 90.0 90.1  PLT 11* 10* 9* 7* 7*   Basic Metabolic Panel: Recent Labs  Lab 08/07/19 1955 08/12/19 0914 08/13/19 0629  NA 135 138 141  K 4.2 4.2 4.1  CL 102 105 109  CO2 23 21* 23  GLUCOSE 366* 265* 100*  BUN 19 21 20   CREATININE 1.18* 1.10* 0.85  CALCIUM 8.5* 8.6* 8.1*    Studies: No results found.  Scheduled Meds: . aminocaproic acid  500 mg Oral Q6H  . ciprofloxacin  500 mg Oral BID  . feeding supplement (GLUCERNA SHAKE)  237 mL Oral TID BM  . fluconazole  200 mg Oral Daily  . insulin aspart  0-9 Units Subcutaneous Q4H  . insulin aspart protamine- aspart  5 Units Subcutaneous BID WC  . levothyroxine  50 mcg Oral QAC breakfast  . multivitamin with minerals  1 tablet Oral Daily  . pantoprazole  40 mg Oral BID  . sulfaSALAzine  500 mg Oral QHS  . valACYclovir  500 mg Oral BID  . vitamin B-12  1,000 mcg Oral Daily   Continuous Infusions: . sodium chloride 75 mL/hr at 08/13/19 1317   PRN Meds: acetaminophen **OR** acetaminophen, hydrALAZINE,  loperamide, nystatin, ondansetron **OR** ondansetron (ZOFRAN) IV, oxyCODONE  Time spent: 35 minutes  Author: Val Riles. MD [facility] 08/13/2019 7:36 PM  To reach On-call, see care teams to locate the attending and reach out to them via [email or web address]. If 7PM-7AM, please contact night-coverage If you still have difficulty reaching the attending provider, please page the [facility] (Director on Call) for [facility] on amion for assistance.

## 2019-08-13 NOTE — Progress Notes (Signed)
CRITICAL VALUE ALERT  Critical Value:  1.2 WBC  Date & Time Notied:  08/13/19 0915  Provider Notified:  Dr. Dwyane Dee notified   Orders Received/Actions taken: Pending  Fuller Mandril, RN

## 2019-08-14 ENCOUNTER — Inpatient Hospital Stay: Payer: Medicare Other | Admitting: Oncology

## 2019-08-14 ENCOUNTER — Encounter: Payer: Self-pay | Admitting: Oncology

## 2019-08-14 ENCOUNTER — Inpatient Hospital Stay: Payer: Medicare Other

## 2019-08-14 ENCOUNTER — Other Ambulatory Visit: Payer: Self-pay | Admitting: Oncology

## 2019-08-14 ENCOUNTER — Ambulatory Visit: Payer: Medicare Other | Admitting: Internal Medicine

## 2019-08-14 DIAGNOSIS — Z7189 Other specified counseling: Secondary | ICD-10-CM

## 2019-08-14 DIAGNOSIS — D61818 Other pancytopenia: Secondary | ICD-10-CM

## 2019-08-14 DIAGNOSIS — K922 Gastrointestinal hemorrhage, unspecified: Secondary | ICD-10-CM

## 2019-08-14 DIAGNOSIS — D46Z Other myelodysplastic syndromes: Secondary | ICD-10-CM

## 2019-08-14 DIAGNOSIS — D649 Anemia, unspecified: Secondary | ICD-10-CM

## 2019-08-14 LAB — GLUCOSE, CAPILLARY
Glucose-Capillary: 130 mg/dL — ABNORMAL HIGH (ref 70–99)
Glucose-Capillary: 157 mg/dL — ABNORMAL HIGH (ref 70–99)
Glucose-Capillary: 161 mg/dL — ABNORMAL HIGH (ref 70–99)
Glucose-Capillary: 171 mg/dL — ABNORMAL HIGH (ref 70–99)
Glucose-Capillary: 186 mg/dL — ABNORMAL HIGH (ref 70–99)
Glucose-Capillary: 186 mg/dL — ABNORMAL HIGH (ref 70–99)
Glucose-Capillary: 234 mg/dL — ABNORMAL HIGH (ref 70–99)
Glucose-Capillary: 262 mg/dL — ABNORMAL HIGH (ref 70–99)

## 2019-08-14 LAB — BASIC METABOLIC PANEL
Anion gap: 7 (ref 5–15)
BUN: 19 mg/dL (ref 8–23)
CO2: 22 mmol/L (ref 22–32)
Calcium: 8.1 mg/dL — ABNORMAL LOW (ref 8.9–10.3)
Chloride: 108 mmol/L (ref 98–111)
Creatinine, Ser: 0.94 mg/dL (ref 0.44–1.00)
GFR calc Af Amer: >60 mL/min (ref >60)
GFR calc non Af Amer: 57 mL/min — ABNORMAL LOW (ref >60)
Glucose, Bld: 169 mg/dL — ABNORMAL HIGH (ref 70–99)
Potassium: 4 mmol/L (ref 3.5–5.1)
Sodium: 137 mmol/L (ref 135–145)

## 2019-08-14 LAB — CBC
HCT: 25 % — ABNORMAL LOW (ref 36.0–46.0)
HCT: 24.4 % — ABNORMAL LOW (ref 36.0–46.0)
Hemoglobin: 7.7 g/dL — ABNORMAL LOW (ref 12.0–15.0)
Hemoglobin: 7.4 g/dL — ABNORMAL LOW (ref 12.0–15.0)
MCH: 27.8 pg (ref 26.0–34.0)
MCH: 27.6 pg (ref 26.0–34.0)
MCHC: 30.8 g/dL (ref 30.0–36.0)
MCHC: 30.3 g/dL (ref 30.0–36.0)
MCV: 90.3 fL (ref 80.0–100.0)
MCV: 91 fL (ref 80.0–100.0)
Platelets: 6 10*3/uL — CL (ref 150–400)
Platelets: 15 10*3/uL — CL (ref 150–400)
RBC: 2.77 MIL/uL — ABNORMAL LOW (ref 3.87–5.11)
RBC: 2.68 MIL/uL — ABNORMAL LOW (ref 3.87–5.11)
RDW: 18.5 % — ABNORMAL HIGH (ref 11.5–15.5)
RDW: 18.6 % — ABNORMAL HIGH (ref 11.5–15.5)
WBC: 1.5 10*3/uL — ABNORMAL LOW (ref 4.0–10.5)
WBC: 1.4 10*3/uL — CL (ref 4.0–10.5)
nRBC: 2 % — ABNORMAL HIGH (ref 0.0–0.2)
nRBC: 2.9 % — ABNORMAL HIGH (ref 0.0–0.2)

## 2019-08-14 LAB — MAGNESIUM: Magnesium: 1.8 mg/dL (ref 1.7–2.4)

## 2019-08-14 LAB — PREPARE RBC (CROSSMATCH)

## 2019-08-14 LAB — PHOSPHORUS: Phosphorus: 3.5 mg/dL (ref 2.5–4.6)

## 2019-08-14 MED ORDER — SODIUM CHLORIDE 0.9% FLUSH: 10.0000 mL | INTRAVENOUS | Status: DC | PRN

## 2019-08-14 MED ORDER — HEPARIN SOD (PORK) LOCK FLUSH 100 UNIT/ML IV SOLN
250.0000 [IU] | INTRAVENOUS | Status: DC | PRN
  Filled 2019-08-14: qty 5

## 2019-08-14 MED ORDER — HEPARIN SOD (PORK) LOCK FLUSH 100 UNIT/ML IV SOLN
500.0000 [IU] | Freq: Every day | INTRAVENOUS | Status: DC | PRN
  Filled 2019-08-14: qty 5

## 2019-08-14 MED ORDER — PANTOPRAZOLE SODIUM 40 MG PO TBEC
40.0000 mg | DELAYED_RELEASE_TABLET | Freq: Every day | ORAL | Status: DC
  Administered 2019-08-15: 40 mg via ORAL
  Filled 2019-08-14: qty 1

## 2019-08-14 MED ORDER — SODIUM CHLORIDE 0.9% IV SOLUTION
250.0000 mL | Freq: Once | INTRAVENOUS | Status: AC
  Administered 2019-08-14: 250 mL via INTRAVENOUS

## 2019-08-14 MED ORDER — SODIUM CHLORIDE 0.9% FLUSH: 3.0000 mL | INTRAVENOUS | Status: DC | PRN

## 2019-08-14 NOTE — Progress Notes (Signed)
Patient platelets currently 6 waiting on platelets to transfuse coming from [place]. blood bank was called, platelets hasn't arrived. Will transfuse once platelets are availiable

## 2019-08-14 NOTE — Progress Notes (Signed)
[facility] Progress Note  Patient: Yesenia Hart    MOQ:[id number]  DOA: 08/12/2019     Date of Service: the patient was seen and examined on 08/14/2019  Chief Complaint  Patient presents with  . GI Bleeding   Brief hospital course: Yesenia Hart is a 79 y.o. 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  Per her daughter (I called her daughter by phone), patient had 2 episode of bright red rectal bleeding today.  Patient does not have nausea vomiting or abdominal pain.  She has history of MDS.  She had 1 unit of blood transfusion on Thursday per her daughter.  Denies chest pain, shortness breath, cough, fever or chills.  No symptoms of UTI.  Daughter states that patient injured her left wrist on Thursday night, imaging did not show fracture.  Patient still has some pain and swelling in the left wrist. Pt is on multiple antibiotics for prophylaxis (Cipro, Diflucan, Valtrex).  ED Course: pt was found to have pancytopenia with WBC 1.2, Hgb 7.6 and platelets 6 (her WBC was 1.5, hemoglobin 6.7 and platelet 10.0 on 1/28), negative COVID-19 PCR, stable renal function, temperature 98.4, blood pressure 130/47, heart rate 73, oxygen saturation 97% on room air.  Patient is admitted to [place] bed as inpatient.  Currently further plan is monitor CBC frequently and transfuse PRBC and platelets as needed Hematology consult tomorrow a.m. for further management of MDS  Assessment and Plan: Principal Problem:   GIB (gastrointestinal bleeding) Active Problems:   Essential hypertension   Hyperlipidemia associated with type 2 diabetes mellitus (HCC)   Type II diabetes mellitus with renal manifestations ([place])   Hypothyroidism due to acquired atrophy of thyroid  CAD (coronary artery disease), native coronary artery   Gastroesophageal reflux disease   MDS (myelodysplastic syndrome), high grade (HCC)   Pancytopenia (HCC)   CKD (chronic kidney disease), stage III   GIB (gastrointestinal bleeding): Patient has pancytopenia. WBC 1.2, Hgb 7.6 and platelets 6 (her WBC was 1.5, hemoglobin 6.7 and platelet 10.0 on 1/28). GI was consulted. Dr. Marius Ditch has singed off since pt does not want to do endoscopy. - continue Amicar - transfuse 1 unit of platelet and 1 unit of blood - s/p IVF: 75 mL/hr, d/c - s/p pantoprazole 40 mg bid, changed to p.o. daily - Zofran IV for nausea - Avoid NSAIDs and SQ heparin - Maintain IV access (2 large bore IVs if possible). - Monitor closely and follow q6h cbc, transfuse as necessary, if Hgb<7.0 - LaB: INR, PTT and type screen -08/13/2019 platelet less than 10K so transfuse 1 unit of platelet  MDS (myelodysplastic syndrome), high grade and pancytopenia: Per her daughter, patient stopped chemotherapy since she is no longer responding to treatment. -Pt is on multiple antibiotics for prophylaxis (Cipro, Diflucan, Valtrex). -f/u with Dr. Tasia Catchings of oncology, consulted, discussed with patient's daughter who does not want any extensive medical management.  Keep platelet count above 15,000 and discharge planning follow-up as an outpatient for transfusion with oncologist.  Left wrist swelling, POA 08/07/2019 x-ray left wrist: Advanced osteoarthritis.  No acute bony abnormality. 08/14/2019 x-ray left wrist: nondisplaced fracture of the scaphoid ??  Consider MRI if history of injury, diffuse osteopenia.Degenerative changes, including chondral calcifications Orthopedics consulted, recommended MRI left wrist  Essential hypertension: -will hold lasix and Quinapril since pt is  at risk of developing hypotension -As needed hydralazine  Hyperlipidemia associated with type 2 diabetes mellitus: -lipitor is on hold while pt is on Diflucan per her  oncologist  Type II diabetes mellitus with renal manifestations: Most recent A1c 6.4 on 04/21/19, controled. Patient is taking Amaryl, 70/30 insulin at home -will decrease 70/30 insulin dose from  8 to 5 units bid -SSI  Hypothyroidism due to acquired atrophy of thyroid: -Synthroid  CAD (coronary artery disease), native coronary artery: s/p of stent -hold lipitor as above  Gastroesophageal reflux disease: -on protonix  CKD (chronic kidney disease), stage IIIa: stable -f/u by BMP   Body mass index is 30.24 kg/m.  Nutrition Problem: Inadequate oral intake Etiology: altered GI function, early satiety(GIB, hisotry of AVMs in colon and stomach) Interventions: Interventions: MVI, Glucerna shake   Diet: Heart healthy DVT Prophylaxis: SCD, pharmacological prophylaxis contraindicated due to GI bleeding   Advance goals of care discussion: DNR  Family Communication: family was no present at bedside, at the time of interview.  The pt provided permission to discuss medical plan with the family. Opportunity was given to ask question and all questions were answered satisfactorily.   Disposition:  Pt is from home, admitted with GI bleeding and pancytopenia, still has low platelet count, which precludes a safe discharge. Discharge to home, when GI bleeding stops and pancytopenia improved Left wrist swelling, MRI pending Possible discharge tomorrow if platelet count improves and no GI bleeding.  Subjective: No overnight issues, left wrist swelling and pain is getting worse, patient is unable to move wrist. Her bleeding has been stopped so far.  Patient denied any other symptoms, no abdominal pain no nausea vomiting diarrhea, denies any chest pain or palpitation no shortness of breath.  Physical Exam: General:  alert oriented to time, place, and person.  Appear in mild distress, affect appropriate Eyes: PERRLA ENT: Oral Mucosa Clear, moist  Neck: no JVD,  Cardiovascular: S1 and S2  Present, no Murmur,  Respiratory: good respiratory effort, Bilateral Air entry equal and Decreased, No Crackles, no wheezes Abdomen: Bowel Sound present, Soft and no tenderness,  Skin: no rashes Extremities: no Pedal edema, no calf tenderness.  Left wrist swelling, mild erythema, edematous and tender Neurologic: without any new focal findings and mental status, alert and oriented x3 Gait not checked due to patient safety concerns  Vitals:   08/14/19 0513 08/14/19 0521 08/14/19 0540 08/14/19 1140  BP: (!) 130/46  (!) 137/51 (!) 123/45  Pulse: 83  81 79  Resp: 12  14 18   Temp:  98.5 F (36.9 C) (!) 97.5 F (36.4 C) 98 F (36.7 C)  TempSrc:  Oral Oral Oral  SpO2: 97%  95% 98%  Weight:   79.9 kg   Height:        Intake/Output Summary (Last 24 hours) at 08/14/2019 1715 Last data filed at 08/14/2019 1239 Gross per 24 hour  Intake 1913.49 ml  Output 775 ml  Net 1138.49 ml   Filed Weights   08/12/19 1739 08/13/19 0500 08/14/19 0540  Weight: 77.1 kg 74.4 kg 79.9 kg    Data Reviewed: I have personally reviewed and interpreted daily labs, tele strips, imagings as discussed above. I reviewed all nursing notes, pharmacy notes, vitals, pertinent old records I have discussed plan of care as described above with RN and patient/family.  CBC: Recent Labs  Lab 08/13/19 0629 08/13/19 1231 08/13/19 1846 08/14/19 0011 08/14/19 0612  WBC 1.2* 1.5* 1.3* 1.5* 1.4*  HGB 7.9* 8.4* 7.9* 7.7*  7.4*  HCT 24.8* 26.9* 25.4* 25.0* 24.4*  MCV 89.9 90.0 90.1 90.3 91.0  PLT 9* 7* 7* 6* 15*   Basic Metabolic Panel: Recent Labs  Lab 08/07/19 1955 08/12/19 0914 08/13/19 0629 08/14/19 0612  NA 135 138 141 137  K 4.2 4.2 4.1 4.0  CL 102 105 109 108  CO2 23 21* 23 22  GLUCOSE 366* 265* 100* 169*  BUN 19 21 20 19   CREATININE 1.18* 1.10* 0.85 0.94  CALCIUM 8.5* 8.6* 8.1* 8.1*  MG  --   --   --  1.8  PHOS  --   --   --  3.5    Studies: DG Wrist Complete Left  Result Date: 08/14/2019 CLINICAL  DATA:  79 year old female with a history of wrist pain EXAM: LEFT WRIST - COMPLETE 3+ VIEW COMPARISON:  None. FINDINGS: Diffuse osteopenia. The scaphoid view demonstrates a linear lucency through the waist of the scaphoid, potentially a nondisplaced fracture though limited by the osteopenia. Advanced degenerative changes at the first carpometacarpal joint. Chondral calcifications at the radiocarpal and ulnar carpal joints. The arcs of the wrist are maintained. Unremarkable appearance of the distal radius and ulna. IMPRESSION: There is a questionable nondisplaced fracture of the scaphoid. If the patient has a history of wrist injury and/or point tenderness at the snuffbox, further evaluation with MR may be considered. Diffuse osteopenia. Degenerative changes, including chondral calcifications. Electronically Signed   By: Corrie Mckusick D.O.   On: 08/14/2019 10:04    Scheduled Meds: . sodium chloride  250 mL Intravenous Once  . aminocaproic acid  500 mg Oral Q6H  . ciprofloxacin  500 mg Oral BID  . feeding supplement (GLUCERNA SHAKE)  237 mL Oral TID BM  . fluconazole  200 mg Oral Daily  . insulin aspart  0-9 Units Subcutaneous Q4H  . insulin aspart protamine- aspart  5 Units Subcutaneous BID WC  . levothyroxine  50 mcg Oral QAC breakfast  . multivitamin with minerals  1 tablet Oral Daily  . pantoprazole  40 mg Oral BID  . sulfaSALAzine  500 mg Oral QHS  . valACYclovir  500 mg Oral BID  . vitamin B-12  1,000 mcg Oral Daily   Continuous Infusions:  PRN Meds: acetaminophen **OR** acetaminophen, heparin lock flush, heparin lock flush, hydrALAZINE, loperamide, nystatin, ondansetron **OR** ondansetron (ZOFRAN) IV, oxyCODONE, sodium chloride flush, sodium chloride flush  Time spent: 35 minutes  Author: Val Riles. MD [facility] 08/14/2019 5:15 PM  To reach On-call, see care teams to locate the attending and reach out to them via [email or web address]. If 7PM-7AM, please contact night-coverage If  you still have difficulty reaching the attending provider, please page the [facility] (Director on Call) for [facility] on amion for assistance.

## 2019-08-14 NOTE — Consult Note (Signed)
Hematology/Oncology Consult note [facility] Telephone:([phone] [phone]  Patient Care Team: Glean Hess, MD as PCP - General (Internal Medicine) Earlie Server, MD as Consulting Physician (Oncology)   Name of the patient: Yesenia Hart  [id number]  1941/07/08   Date of visit: 08/14/19 REASON FOR COSULTATION:  MDS, pancytopenia History of presenting illness-  79 y.o. female with PMH listed at below who presents to ER for evaluation of rectal bleeding. Patient is known to me for history of MDS which progressed after being stable on azacitidine recently.  Azacitidine has been stopped.  Patient has had obtained second opinion at [facility].  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  There is no good standard therapy outside of supportive care. Patient developed rectal bleeding and was instructed to go to emergency room for further evaluation. Upon arrival to the ER, patient has a platelet count of 6000.  Hemoglobin 7.6.  WBC 1.2.  Patient received supportive care with platelet and blood transfusion. GI was consulted for rectal bleeding.  Patient is not interested to undergo any endoscopy evaluation at this time.  Patient is being managed conservatively. Today patient was seen at bedside.  She reports feeling tired and fatigued.  No additional rectal bleeding episodes.  Review of Systems  Constitutional: Positive for fatigue.  HENT:   Negative for sore throat.   Eyes: Negative for eye problems.  Respiratory: Negative for cough and shortness of breath.   Gastrointestinal: Positive for blood in stool. Negative for abdominal pain.  Genitourinary: Negative for hematuria.   Musculoskeletal: Negative for back pain.  Skin: Negative for rash.  Neurological: Negative for light-headedness.  Hematological: Bruises/bleeds easily.  Psychiatric/Behavioral: Negative for confusion.    Allergies  Allergen Reactions  . Meperidine  Nausea And Vomiting  . Pioglitazone Swelling    Other reaction(s): Edema  . Acarbose Diarrhea    Patient Active Problem List   Diagnosis Date Noted  . GIB (gastrointestinal bleeding) 08/12/2019  . CKD (chronic kidney disease), stage III 08/12/2019  . Neutropenia ([place]) 12/24/2018  . Pancytopenia ([place]) 12/24/2018  . MDS (myelodysplastic syndrome), high grade ([place]) 11/18/2018  . Gastroesophageal reflux disease 07/19/2018  . B12 deficiency 06/10/2018  . 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 CAD (coronary artery disease), native coronary artery 03/15/2018  . Drug-induced neutropenia ([place]) 01/14/2018  . Type II diabetes mellitus with renal manifestations ([place]) 12/17/2017  . Fever blister 12/17/2017  . Tobacco use disorder, moderate, in sustained remission 12/17/2017  . Thrombocytopenia ([place]) 12/12/2017  . Goals of care, counseling/discussion 12/12/2017  . Other fatigue 12/12/2017  . Asthma 11/30/2017  . Cataract 11/30/2017  . 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 Edema 11/30/2017  . Essential hypertension 11/30/2017  . Hyperlipidemia associated with type 2 diabetes mellitus ([place]) 11/30/2017  . Osteoarthritis 11/30/2017  . 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 MDS (myelodysplastic syndrome) with 5q deletion ([place]) 11/30/2017     Past Medical History:  Diagnosis Date  . Anemia   . B12 deficiency 06/10/2018  . Clotting disorder ([place])   . Diabetes mellitus without complication ([place])   . IBS (irritable bowel syndrome)   . Iron deficiency anemia due to chronic blood loss 12/12/2017  . MDS (myelodysplastic syndrome) ([place])   . MDS (myelodysplastic syndrome) ([place])   . Thyroid disease      Past Surgical History:  Procedure Laterality Date  . APPENDECTOMY    . breast biopsy 14'    .  CHOLECYSTECTOMY    . CORONARY ANGIOPLASTY WITH STENT PLACEMENT  2016   in [place] History   Socioeconomic History  . Marital status: Married    Spouse  name: Not on file  . Number of children: Not on file  . Years of education: Not on file  . Highest education level: Not on file  Occupational History  . Occupation: retired  Tobacco Use  . Smoking status: Former Smoker    Packs/day: 1.00    Types: Cigarettes    Quit date: 1999    Years since quitting: 22.1  . Smokeless tobacco: Never Used  Substance and Sexual Activity  . Alcohol use: Not Currently  . Drug use: Never  . Sexual activity: Yes  Other Topics Concern  . Not on file  Social History Narrative  . Not on file   Social Determinants of Health   Financial Resource Strain:   . Difficulty of Paying Living Expenses: Not on file  Food Insecurity:   . Worried About Charity fundraiser in the Last Year: Not on file  . Ran Out of Food in the Last Year: Not on file  Transportation Needs:   . Lack of Transportation (Medical): Not on file  . Lack of Transportation (Non-Medical): Not on file  Physical Activity:   . Days of Exercise per Week: Not on file  . Minutes of Exercise per Session: Not on file  Stress:   . Feeling of Stress : Not on file  Social Connections:   . Frequency of Communication with Friends and Family: Not on file  . Frequency of Social Gatherings with Friends and Family: Not on file  . Attends Religious Services: Not on file  . Active Member of Clubs or Organizations: Not on file  . Attends Archivist Meetings: Not on file  . Marital Status: Not on file  Intimate Partner Violence:   . Fear of Current or Ex-Partner: Not on file  . Emotionally Abused: Not on file  . Physically Abused: Not on file  . Sexually Abused: Not on file     Family History  Adopted: Yes  Family history unknown: Yes     Current Facility-Administered Medications:  .  0.9 %  sodium chloride infusion (Manually program via Guardrails IV Fluids), 250 mL, Intravenous, Once, Earlie Server, MD .  acetaminophen (TYLENOL) tablet 650 mg, 650 mg, Oral, Q6H PRN, 650 mg at 08/14/19  0901 **OR** acetaminophen (TYLENOL) suppository 650 mg, 650 mg, Rectal, Q6H PRN, Ivor Costa, MD .  aminocaproic acid (AMICAR) tablet 500 mg, 500 mg, Oral, Q6H, Ivor Costa, MD, 500 mg at 08/14/19 0900 .  ciprofloxacin (CIPRO) tablet 500 mg, 500 mg, Oral, BID, Ivor Costa, MD, 500 mg at 08/14/19 0900 .  feeding supplement (GLUCERNA SHAKE) (GLUCERNA SHAKE) liquid 237 mL, 237 mL, Oral, TID BM, Val Riles, MD, 237 mL at 08/14/19 1418 .  fluconazole (DIFLUCAN) tablet 200 mg, 200 mg, Oral, Daily, Ivor Costa, MD, 200 mg at 08/14/19 0900 .  heparin lock flush 100 unit/mL, 500 Units, Intracatheter, Daily PRN, Earlie Server, MD .  heparin lock flush 100 unit/mL, 250 Units, Intracatheter, PRN, Earlie Server, MD .  hydrALAZINE (APRESOLINE) tablet 25 mg, 25 mg, Oral, TID PRN, Ivor Costa, MD .  insulin aspart (novoLOG) injection 0-9 Units, 0-9 Units, Subcutaneous, Q4H, Ivor Costa, MD, 5 Units at 08/14/19 1236 .  insulin aspart protamine- aspart (NOVOLOG MIX 70/30) injection 5 Units, 5 Units, Subcutaneous, BID WC, Ivor Costa,  MD, 5 Units at 08/14/19 0859 .  levothyroxine (SYNTHROID) tablet 50 mcg, 50 mcg, Oral, QAC breakfast, Ivor Costa, MD, 50 mcg at 08/14/19 0541 .  loperamide (IMODIUM) capsule 2 mg, 2 mg, Oral, PRN, Ivor Costa, MD .  multivitamin with minerals tablet 1 tablet, 1 tablet, Oral, Daily, Val Riles, MD, 1 tablet at 08/14/19 0859 .  nystatin (MYCOSTATIN/NYSTOP) topical powder 1 application, 1 application, Topical, BID PRN, Ivor Costa, MD .  ondansetron (ZOFRAN) tablet 4 mg, 4 mg, Oral, Q6H PRN **OR** ondansetron (ZOFRAN) injection 4 mg, 4 mg, Intravenous, Q6H PRN, Ivor Costa, MD .  oxyCODONE (Oxy IR/ROXICODONE) immediate release tablet 5 mg, 5 mg, Oral, Q6H PRN, Val Riles, MD, 5 mg at 08/14/19 0021 .  pantoprazole (PROTONIX) EC tablet 40 mg, 40 mg, Oral, BID, Ivor Costa, MD, 40 mg at 08/14/19 0859 .  sodium chloride flush (NS) 0.9 % injection 10 mL, 10 mL, Intracatheter, PRN, Earlie Server, MD .  sodium  chloride flush (NS) 0.9 % injection 3 mL, 3 mL, Intracatheter, PRN, Earlie Server, MD .  sulfaSALAzine (AZULFIDINE) EC tablet 500 mg, 500 mg, Oral, QHS, Ivor Costa, MD, 500 mg at 08/13/19 2155 .  valACYclovir (VALTREX) tablet 500 mg, 500 mg, Oral, BID, Ivor Costa, MD, 500 mg at 08/14/19 0900 .  vitamin B-12 (CYANOCOBALAMIN) tablet 1,000 mcg, 1,000 mcg, Oral, Daily, Ivor Costa, MD, 1,000 mcg at 08/14/19 0901   Physical exam:  Vitals:   08/14/19 0513 08/14/19 0521 08/14/19 0540 08/14/19 1140  BP: (!) 130/46  (!) 137/51 (!) 123/45  Pulse: 83  81 79  Resp: 12  14 18   Temp:  98.5 F (36.9 C) (!) 97.5 F (36.4 C) 98 F (36.7 C)  TempSrc:  Oral Oral Oral  SpO2: 97%  95% 98%  Weight:   176 lb 2.4 oz (79.9 kg)   Height:       Physical Exam  Constitutional: No distress.  HENT:  Head: Normocephalic and atraumatic.  Eyes: Pupils are equal, round, and reactive to light.  Cardiovascular: Normal rate.  Pulmonary/Chest: Effort normal.  Abdominal: Soft.  Musculoskeletal:     Cervical back: Normal range of motion and neck supple.  Neurological: She is alert.  Skin: There is pallor.  Psychiatric: Mood normal.        CMP Latest Ref Rng & Units 08/14/2019  Glucose 70 - 99 mg/dL 169(H)  BUN 8 - 23 mg/dL 19  Creatinine 0.44 - 1.00 mg/dL 0.94  Sodium 135 - 145 mmol/L 137  Potassium 3.5 - 5.1 mmol/L 4.0  Chloride 98 - 111 mmol/L 108  CO2 22 - 32 mmol/L 22  Calcium 8.9 - 10.3 mg/dL 8.1(L)  Total Protein 6.5 - 8.1 g/dL -  Total Bilirubin 0.3 - 1.2 mg/dL -  Alkaline Phos 38 - 126 U/L -  AST 15 - 41 U/L -  ALT 0 - 44 U/L -   CBC Latest Ref Rng & Units 08/14/2019  WBC 4.0 - 10.5 K/uL 1.4(LL)  Hemoglobin 12.0 - 15.0 g/dL 7.4(L)  Hematocrit 36.0 - 46.0 % 24.4(L)  Platelets 150 - 400 K/uL 15(LL)   RADIOGRAPHIC STUDIES: I have personally reviewed the radiological images as listed and agreed with the findings in the report.  DG Wrist Complete Left  Result Date: 08/14/2019 CLINICAL DATA:   79 year old female with a history of wrist pain EXAM: LEFT WRIST - COMPLETE 3+ VIEW COMPARISON:  None. FINDINGS: Diffuse osteopenia. The scaphoid view demonstrates a linear lucency through the waist of the  scaphoid, potentially a nondisplaced fracture though limited by the osteopenia. Advanced degenerative changes at the first carpometacarpal joint. Chondral calcifications at the radiocarpal and ulnar carpal joints. The arcs of the wrist are maintained. Unremarkable appearance of the distal radius and ulna. IMPRESSION: There is a questionable nondisplaced fracture of the scaphoid. If the patient has a history of wrist injury and/or point tenderness at the snuffbox, further evaluation with MR may be considered. Diffuse osteopenia. Degenerative changes, including chondral calcifications. Electronically Signed   By: Corrie Mckusick D.O.   On: 08/14/2019 10:04   DG Wrist Complete Left  Result Date: 08/07/2019 CLINICAL DATA:  Pain, swelling left wrist.  No known injury. EXAM: LEFT WRIST - COMPLETE 3+ VIEW COMPARISON:  None. FINDINGS: Advanced osteoarthritis at the 1st carpometacarpal joint and radiocarpal joint. No acute bony abnormality. Specifically, no fracture, subluxation, or dislocation. IMPRESSION: Advanced osteoarthritis.  No acute bony abnormality. Electronically Signed   By: Rolm Baptise M.D.   On: 08/07/2019 21:45    Assessment and plan- Patient is a 79 y.o. female with history of MDS, progressed after first-line treatment, pancytopenia, presented to emergency room for evaluation of rectal bleeding.   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 She recently progressed.  Recent smear showed scattered immature granulocytes are noted, less than 10%. She has had second opinion at [facility].  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 I had a lengthy discussion with patient and also has called patient's daughter over the phone and discussed with her.  Patient is not interested in any aggressive treatment for MDS/or underlying leukemia.  She does not want to proceed with bone marrow biopsy.  She prefers to supportive care to see if her counts can be stable outpatient for her to have some quality of life at home.  Meanwhile she is also realistic that she might be approaching end-of-life and hospice is an option for her.  Daughter is aware about her mother's thought and is supportive. Patient has already established care with palliative care medicine [facility] outpatient.  She will follow up with him next week. For now plan continue supportive care, I will decrease her blood work to daily. She has symptomatic anemia, with a hemoglobin 7.4.  And proceed with 1 unit of irradiated PRBC transfusion. Thrombocytopenia, in the context of rectal bleeding, please transfuse with irradiated platelets to keep platelets above 15000.  Continue Amicar Leukopenia, predominantly neutropenia, continue prophylactic antibiotics with fluconazole 200 mg daily, Cipro 500 mg twice daily, Valtrex 500 mg twice daily.  If her counts are stable tomorrow, from oncology aspect, okay to discharge home.  She will have a follow-up appointment with me at the cancer center on 08/18/2019 @1145 . Code Status DNR I communicated plan with Dr.Kumar via secure chat.  Thank you for allowing me to participate in the care of this patient.  Total face to face encounter time for this patient visit was 70 min. >50% of the time was  spent in counseling and coordination of care.    Earlie Server, MD, PhD Hematology Oncology [facility] Pager- [phone] 08/14/2019

## 2019-08-14 NOTE — Progress Notes (Signed)
Blood bank was called platelets hasn't arrived. They will call floor nurse once platelets has arrived and ready for transfusion

## 2019-08-15 ENCOUNTER — Inpatient Hospital Stay: Payer: Medicare Other

## 2019-08-15 ENCOUNTER — Other Ambulatory Visit: Payer: Self-pay | Admitting: Oncology

## 2019-08-15 DIAGNOSIS — D46C Myelodysplastic syndrome with isolated del(5q) chromosomal abnormality: Secondary | ICD-10-CM

## 2019-08-15 LAB — PREPARE PLATELET PHERESIS: Unit division: 0

## 2019-08-15 LAB — BASIC METABOLIC PANEL
Anion gap: 7 (ref 5–15)
BUN: 17 mg/dL (ref 8–23)
CO2: 24 mmol/L (ref 22–32)
Calcium: 8.4 mg/dL — ABNORMAL LOW (ref 8.9–10.3)
Chloride: 108 mmol/L (ref 98–111)
Creatinine, Ser: 0.98 mg/dL (ref 0.44–1.00)
GFR calc Af Amer: >60 mL/min (ref >60)
GFR calc non Af Amer: 54 mL/min — ABNORMAL LOW (ref >60)
Glucose, Bld: 157 mg/dL — ABNORMAL HIGH (ref 70–99)
Potassium: 4.4 mmol/L (ref 3.5–5.1)
Sodium: 139 mmol/L (ref 135–145)

## 2019-08-15 LAB — CBC WITH DIFFERENTIAL/PLATELET
Abs Immature Granulocytes: 0.09 10*3/uL — ABNORMAL HIGH (ref 0.00–0.07)
Basophils Absolute: 0 10*3/uL (ref 0.0–0.1)
Basophils Relative: 1 %
Eosinophils Absolute: 0 10*3/uL (ref 0.0–0.5)
Eosinophils Relative: 1 %
HCT: 27.1 % — ABNORMAL LOW (ref 36.0–46.0)
Hemoglobin: 8.5 g/dL — ABNORMAL LOW (ref 12.0–15.0)
Immature Granulocytes: 6 %
Lymphocytes Relative: 53 %
Lymphs Abs: 0.8 10*3/uL (ref 0.7–4.0)
MCH: 28.1 pg (ref 26.0–34.0)
MCHC: 31.4 g/dL (ref 30.0–36.0)
MCV: 89.4 fL (ref 80.0–100.0)
Monocytes Absolute: 0.2 10*3/uL (ref 0.1–1.0)
Monocytes Relative: 16 %
Neutro Abs: 0.3 10*3/uL — ABNORMAL LOW (ref 1.7–7.7)
Neutrophils Relative %: 23 %
Platelets: 17 10*3/uL — CL (ref 150–400)
RBC: 3.03 MIL/uL — ABNORMAL LOW (ref 3.87–5.11)
RDW: 18.1 % — ABNORMAL HIGH (ref 11.5–15.5)
Smear Review: NORMAL
WBC: 1.5 10*3/uL — ABNORMAL LOW (ref 4.0–10.5)
nRBC: 2.1 % — ABNORMAL HIGH (ref 0.0–0.2)

## 2019-08-15 LAB — BPAM RBC
Blood Product Expiration Date: 202102102359
Blood Product Expiration Date: 202102162359
ISSUE DATE / TIME: 202102021642
ISSUE DATE / TIME: 202102041831
Unit Type and Rh: 9500
Unit Type and Rh: 9500

## 2019-08-15 LAB — TYPE AND SCREEN
ABO/RH(D): B POS
Antibody Screen: NEGATIVE
Unit division: 0
Unit division: 0

## 2019-08-15 LAB — GLUCOSE, CAPILLARY
Glucose-Capillary: 156 mg/dL — ABNORMAL HIGH (ref 70–99)
Glucose-Capillary: 145 mg/dL — ABNORMAL HIGH (ref 70–99)
Glucose-Capillary: 183 mg/dL — ABNORMAL HIGH (ref 70–99)
Glucose-Capillary: 188 mg/dL — ABNORMAL HIGH (ref 70–99)

## 2019-08-15 LAB — BPAM PLATELET PHERESIS
Blood Product Expiration Date: 202102062359
ISSUE DATE / TIME: 202102040518
Unit Type and Rh: 7300

## 2019-08-15 MED ORDER — PREDNISONE 20 MG PO TABS
40.0000 mg | ORAL_TABLET | Freq: Every day | ORAL | Status: DC
  Administered 2019-08-15: 40 mg via ORAL
  Filled 2019-08-15: qty 2

## 2019-08-15 MED ORDER — ADULT MULTIVITAMIN W/MINERALS CH: 1.0000 | ORAL_TABLET | Freq: Every day | ORAL | 0 refills | Status: AC

## 2019-08-15 MED ORDER — PREDNISONE 20 MG PO TABS: 40.0000 mg | ORAL_TABLET | Freq: Every day | ORAL | 0 refills | Status: AC

## 2019-08-15 NOTE — Consult Note (Signed)
ORTHOPAEDIC CONSULTATION  REQUESTING PHYSICIAN: Val Riles, MD  Chief Complaint:   Left wrist pain  History of Present Illness: Yesenia Hart is a 79 y.o. female who presented to the emergency department on 08/07/2019 with a left wrist pain that began when she woke up in the morning.  There is no antecedent trauma that she can recall.  She was discharged after blood transfusion due to low hemoglobin.  She has a history of myelodysplastic syndrome and significant anemia.  She is DNR.  She is not interested and significantly aggressive interventions for her myelodysplastic syndrome.   She then represented to the emergency department on 08/12/2019 due to platelet count of 10,000 and hemoglobin of 6.7 as well as rectal bleeding.   She was then found to have a GI bleed after admission.  Endoscopy was offered, the patient declined.  Initial radiographs on 08/07/2019 did not show any fractures or dislocations.  Follow-up wrist x-rays on 08/14/2019 suggested possible nondisplaced scaphoid fracture.  Additionally, there were some suggestions of CPPD on these radiographs.  Recent MRI did not show any acute fractures, but did show significant diffuse degenerative changes and suggestions of CPPD/pseudogout.  MRI did not show focal abscess or significant signs of infection.  Past Medical History:  Diagnosis Date  . Anemia   . B12 deficiency 06/10/2018  . Clotting disorder ([place])   . Diabetes mellitus without complication ([place])   . IBS (irritable bowel syndrome)   . Iron deficiency anemia due to chronic blood loss 12/12/2017  . MDS (myelodysplastic syndrome) ([place])   . MDS (myelodysplastic syndrome) ([place])   . Thyroid disease    Past Surgical History:  Procedure Laterality Date  . APPENDECTOMY    . breast biopsy 65'    . CHOLECYSTECTOMY    . CORONARY ANGIOPLASTY WITH STENT PLACEMENT  2016   in [place] History   Socioeconomic  History  . Marital status: Married    Spouse name: Not on file  . Number of children: Not on file  . Years of education: Not on file  . Highest education level: Not on file  Occupational History  . Occupation: retired  Tobacco Use  . Smoking status: Former Smoker    Packs/day: 1.00    Types: Cigarettes    Quit date: 1999    Years since quitting: 22.1  . Smokeless tobacco: Never Used  Substance and Sexual Activity  . Alcohol use: Not Currently  . Drug use: Never  . Sexual activity: Yes  Other Topics Concern  . Not on file  Social History Narrative  . Not on file   Social Determinants of Health   Financial Resource Strain:   . Difficulty of Paying Living Expenses: Not on file  Food Insecurity:   . Worried About Charity fundraiser in the Last Year: Not on file  . Ran Out of Food in the Last Year: Not on file  Transportation Needs:   . Lack of Transportation (Medical): Not on file  . Lack of Transportation (Non-Medical): Not on file  Physical Activity:   . Days of Exercise per Week: Not on file  . Minutes of Exercise per Session: Not on file  Stress:   . Feeling of Stress : Not on file  Social Connections:   . Frequency of Communication with Friends and Family: Not on file  . Frequency of Social Gatherings with Friends and Family: Not on file  . Attends Religious Services: Not on file  . Active Member  of Clubs or Organizations: Not on file  . Attends Archivist Meetings: Not on file  . Marital Status: Not on file   Family History  Adopted: Yes  Family history unknown: Yes   Allergies  Allergen Reactions  . Meperidine Nausea And Vomiting  . Pioglitazone Swelling    Other reaction(s): Edema  . Acarbose Diarrhea   Prior to Admission medications   Medication Sig Start Date End Date Taking? Authorizing Provider  aminocaproic acid (AMICAR) 500 MG tablet Take 1 tablet (500 mg total) by mouth every 6 (six) hours. 07/25/19  Yes Earlie Server, MD  ciprofloxacin  (CIPRO) 500 MG tablet Take 1 tablet (500 mg total) by mouth 2 (two) times daily. 07/31/19  Yes Earlie Server, MD  Cyanocobalamin (GNP VITAMIN B-12) 1000 MCG TBCR Take 1 tablet (1,000 mcg total) by mouth daily. 07/31/19  Yes Earlie Server, MD  fluconazole (DIFLUCAN) 200 MG tablet Take 200 mg by mouth daily. 08/06/19  Yes [provider]  furosemide (LASIX) 20 MG tablet TAKE 1 TABLET BY MOUTH ONCE DAILY Patient taking differently: Take 20 mg by mouth daily.  06/29/19  Yes Glean Hess, MD  glimepiride (AMARYL) 4 MG tablet TAKE 1 TABLET BY MOUTH ONCE DAILY Patient taking differently: Take 4 mg by mouth daily.  05/28/19  Yes Glean Hess, MD  Insulin Pen Needle (ULTICARE MICRO PEN NEEDLES) 32G X 4 MM MISC Inject 1 each into the skin 2 (two) times daily. 08/05/19  Yes Glean Hess, MD  Lancets [facility] ULTRASOFT) lancets USE AS DIRECTED. 06/29/19  Yes Glean Hess, MD  levothyroxine (SYNTHROID) 50 MCG tablet TAKE 1 TABLET BY MOUTH ONCE DAILY ON AN EMPTY STOMACH. WAIT 30 MINUTES BEFORE TAKING OTHER MEDS. Patient taking differently: Take 50 mcg by mouth daily before breakfast.  04/22/19  Yes Glean Hess, MD  loperamide (IMODIUM) 2 MG capsule Take 2 mg by mouth as needed for diarrhea or loose stools.   Yes [provider]  NOVOLOG MIX 70/30 FLEXPEN (70-30) 100 UNIT/ML FlexPen Inject 0.08 mLs (8 Units total) into the skin 2 (two) times daily with a meal. 12/26/18  Yes Glean Hess, MD  nystatin (MYCOSTATIN/NYSTOP) powder Apply topically 2 (two) times daily as needed. Patient taking differently: Apply 1 application topically 2 (two) times daily as needed (rash).  07/31/19  Yes Earlie Server, MD  ondansetron (ZOFRAN) 8 MG tablet TAKE 1 TABLET BY MOUTH TWICE DAILY AS NEEDED NAUSEA AND VOMITING Patient taking differently: Take 8 mg by mouth every 8 (eight) hours as needed for nausea or vomiting.  07/25/18  Yes Earlie Server, MD  [facility] ULTRA test strip TEST TWICE DAILY 04/19/19  Yes  Glean Hess, MD  pantoprazole (PROTONIX) 40 MG tablet TAKE 1 TABLET BY MOUTH ONCE DAILY Patient taking differently: Take 40 mg by mouth daily.  07/14/19  Yes Glean Hess, MD  quinapril (ACCUPRIL) 20 MG tablet TAKE 1 TABLET BY MOUTH ONCE DAILY Patient taking differently: Take 20 mg by mouth daily.  06/04/19  Yes Glean Hess, MD  sulfaSALAzine (AZULFIDINE) 500 MG tablet TAKE 1 TABLET BY MOUTH AT BEDTIME Patient taking differently: Take 500 mg by mouth at bedtime.  05/16/19  Yes Glean Hess, MD  valACYclovir (VALTREX) 500 MG tablet Take 1 tablet (500 mg total) by mouth 2 (two) times daily. 07/31/19  Yes Earlie Server, MD  atorvastatin (LIPITOR) 20 MG tablet TAKE 1 TABLET BY MOUTH ONCE DAILY Patient taking differently: Take 20 mg  by mouth at bedtime.  05/22/19   Glean Hess, MD   Recent Labs    08/13/19 [phone] 08/13/19 1231 08/13/19 1846 08/14/19 0011 08/14/19 0612 08/15/19 0556  WBC  --  1.5* 1.3* 1.5* 1.4* 1.5*  HGB  --  8.4* 7.9* 7.7* 7.4* 8.5*  HCT  --  26.9* 25.4* 25.0* 24.4* 27.1*  PLT  --  7* 7* 6* 15* 17*  K   < >  --   --   --  4.0 4.4  CL   < >  --   --   --  108 108  CO2   < >  --   --   --  22 24  BUN   < >  --   --   --  19 17  CREATININE   < >  --   --   --  0.94 0.98  GLUCOSE   < >  --   --   --  169* 157*  CALCIUM   < >  --   --   --  8.1* 8.4*   < > = values in this interval not displayed.   DG Wrist Complete Left  Result Date: 08/14/2019 CLINICAL DATA:  79 year old female with a history of wrist pain EXAM: LEFT WRIST - COMPLETE 3+ VIEW COMPARISON:  None. FINDINGS: Diffuse osteopenia. The scaphoid view demonstrates a linear lucency through the waist of the scaphoid, potentially a nondisplaced fracture though limited by the osteopenia. Advanced degenerative changes at the first carpometacarpal joint. Chondral calcifications at the radiocarpal and ulnar carpal joints. The arcs of the wrist are maintained. Unremarkable appearance of the distal radius and  ulna. IMPRESSION: There is a questionable nondisplaced fracture of the scaphoid. If the patient has a history of wrist injury and/or point tenderness at the snuffbox, further evaluation with MR may be considered. Diffuse osteopenia. Degenerative changes, including chondral calcifications. Electronically Signed   By: Corrie Mckusick D.O.   On: 08/14/2019 10:04   MR WRIST LEFT WO CONTRAST  Result Date: 08/15/2019 CLINICAL DATA:  Right wrist pain and swelling. Possible scaphoid fracture. Abnormal x-ray EXAM: MR OF THE LEFT WRIST WITHOUT CONTRAST TECHNIQUE: Multiplanar, multisequence MR imaging of the left wrist was performed. No intravenous contrast was administered. COMPARISON:  X-ray 08/14/2019 FINDINGS: Technical note: Motion degraded examination. Ligaments: Suboptimally evaluated. No evidence of high-grade tear involving the scapholunate or lunotriquetral ligaments. Osseous alignment is maintained. Triangular fibrocartilage: Appears mildly degenerated, grossly intact. Small amount of fluid with suggestion of synovitis within the DRUJ. Tendons: Flexor and extensor tendons appear grossly intact. No appreciable tenosynovial fluid collections. Carpal tunnel/median nerve: No obvious abnormalities. Guyon's canal: Unremarkable. Joint/cartilage: Advanced degenerative changes throughout the wrist, most pronounced at the triscaphe and radiocarpal joints. Severe degenerative changes at the first carpometacarpal joint with chronic osseous fragmentation. Small effusions with synovitis in the proximal and mid carpal row. Bones/carpal alignment: Scattered subchondral cystic changes within the carpal bones most pronounced at the triquetrum and distal scaphoid pole, possibly representing small erosions. No acute fracture identified. Specifically no scaphoid fracture is seen. No dislocation. Other: Circumferential soft tissue edema. No drainable fluid collection. IMPRESSION: 1. No scaphoid fracture identified. 2. Advanced  degenerative changes throughout the wrist, most pronounced at the first carpometacarpal joint. Small effusions with synovitis throughout the wrist and DRUJ. Findings raise suspicion of an underlying crystalline or inflammatory arthropathy including CPPD and RA. Although felt to be less likely, an infectious arthropathy would be difficult to exclude. If  there is clinical suspicion for infection, arthrocentesis should be performed. 3. Nonspecific circumferential soft tissue edema. Electronically Signed   By: Davina Poke D.O.   On: 08/15/2019 13:26     Positive ROS: All other systems have been reviewed and were otherwise negative with the exception of those mentioned in the HPI and as above.  Physical Exam: BP (!) 140/48 (BP Location: Right Arm)   Pulse 76   Temp 98.5 F (36.9 C) (Oral)   Resp 17   Ht 5' 4"  (1.626 m)   Wt 78.2 kg   SpO2 98%   BMI 29.59 kg/m  General:  Alert, no acute distress Psychiatric:  Patient is competent for consent with normal mood and affect   Cardiovascular:  No pedal edema, regular rate and rhythm Respiratory:  No wheezing, non-labored breathing GI:  Abdomen is soft and non-tender Neurologic:  Sensation intact distally, CN grossly intact   Orthopedic Exam:  LUE: +ain/pin/u motor SILT r/u/m/ax +rad pulse RoM of wrist: 30 degrees flexion to 20 degrees wrist extension without significant pain.  Contralateral wrist with 50 degrees wrist extension 60 degrees wrist flexion. Significant soft tissue swelling about the thumb CMC joint as well as the wrist. Significant bruising about the dorsum of the hand.  There was an attempted IV placement about the region of bruising.  There is significant tenderness to palpation about the thumb CMC joint.    Imaging:  As above: Possible nondisplaced scaphoid fracture on radiographs from 08/14/2019.  MRI shows multiple small joint effusions and significant degenerative changes diffusely suggestive of diffuse degenerative  changes as well as possible CPPD/pseudogout.  There are no concerning signs for infection including no focal abscess, no inflammation about the tendon sheaths.  There is some diffuse soft tissue swelling.  These findings were discussed with a musculoskeletal radiologist who agrees that there is low suspicion of septic joint.  Assessment/Plan: 79 year old female with left wrist pain that started 1 week ago without traumatic event.  MRI suggestive of significant degenerative changes and/or CPPD/pseudogout. 1.  Patient can be weightbearing as tolerated on left upper extremity. 2.  Recommend treatment for pseudogout/CPPD and/or diffuse arthritic changes about the hand.  3.  We will plan to reevaluate for improvement tomorrow.  No current plans for operative intervention.  Please page with any questions.   Leim Fabry   08/15/2019 1:14 PM

## 2019-08-15 NOTE — Progress Notes (Signed)
Discharge Summary  Patient ID: Yesenia Hart MRN: [id number] DOB/AGE: 1941-05-27 79 y.o.  Admit date: 08/12/2019 DX: GIB (gastrointestinal bleeding) [K92.2] BRBPR (bright red blood per rectum) [K62.5] Pancytopenia [facility]) [D61.818]   Discharge date: 08/15/2019 Method of transport: POV Discharge address: [place] [address]   Discharge Exam: Blood pressure (!) 140/48, pulse 76, temperature 98.5 F (36.9 C), temperature source Oral, resp. rate 17, height 5' 4"  (1.626 m), weight 78.2 kg, SpO2 98 %. IV removed. Belongings gathered. AVS was provided to the patient which included instructions that addressed activity level, diet, discharge medications, follow-up appointments,  and what to do if symptoms worsen. All questions answered for patient/caregiver clarification.     Signed: Lewie Chamber 08/15/2019, 6:43 PM

## 2019-08-15 NOTE — TOC Progression Note (Signed)
Transition of Care [facility]) - Progression Note    Patient Details  Name: Yesenia Hart MRN: [id number] Date of Birth: [birth year]  Transition of Care [facility]) CM/SW Contact  Cashius Grandstaff, Gardiner Rhyme, LCSW Phone Number: 08/15/2019, 4:48 PM  Clinical Narrative:  Pt has requested start of care on 08/18/2019 for home health services     Expected Discharge Plan: [place] Barriers to Discharge: No Barriers Identified  Expected Discharge Plan and Services Expected Discharge Plan: [place] In-house Referral: Clinical Social Work     Living arrangements for the past 2 months: Apartment Expected Discharge Date: 08/15/19                         HH Arranged: PT, RN, OT, Nurse's Aide [place] Agency: [place] ([place]) Date [place]: 08/15/19   Representative spoke with at [place]: [place] ([place]) Interventions    Readmission Risk Interventions No flowsheet data found.

## 2019-08-15 NOTE — Progress Notes (Signed)
Hematology/Oncology Progress Note [facility] [phone] [phone]  Patient Care Team: Glean Hess, MD as PCP - General (Internal Medicine) Earlie Server, MD as Consulting Physician (Oncology)   Name of the patient: Yesenia Hart  [id number]  Mar 06, 1941  Date of visit: 08/15/19   INTERVAL HISTORY-  No acute overnight event.  Status post 1 unit of PRBC transfusion yesterday.  She feels fatigue is improved. Reports left wrist pain She has not had any bowel movement since admission.   Review of systems- Review of Systems  Constitutional: Positive for fatigue. Negative for appetite change, chills and fever.  HENT:   Negative for hearing loss and voice change.   Eyes: Negative for eye problems.  Respiratory: Negative for chest tightness and cough.   Cardiovascular: Negative for chest pain.  Gastrointestinal: Negative for abdominal distention, abdominal pain and blood in stool.  Endocrine: Negative for hot flashes.  Genitourinary: Negative for difficulty urinating and frequency.   Musculoskeletal: Negative for arthralgias.       Left wrist pain  Skin: Negative for itching and rash.  Neurological: Negative for extremity weakness.  Hematological: Negative for adenopathy.  Psychiatric/Behavioral: Negative for confusion.    Allergies  Allergen Reactions  . Meperidine Nausea And Vomiting  . Pioglitazone Swelling    Other reaction(s): Edema  . Acarbose Diarrhea    Patient Active Problem List   Diagnosis Date Noted  . Symptomatic anemia   . GIB (gastrointestinal bleeding) 08/12/2019  . CKD (chronic kidney disease), stage III 08/12/2019  . Neutropenia ([place]) 12/24/2018  . Pancytopenia ([place]) 12/24/2018  . MDS (myelodysplastic syndrome), high grade ([place]) 11/18/2018  . Gastroesophageal reflux disease 07/19/2018  . B12 deficiency 06/10/2018  . 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 CAD (coronary artery  disease), native coronary artery 03/15/2018  . Drug-induced neutropenia ([place]) 01/14/2018  . Type II diabetes mellitus with renal manifestations ([place]) 12/17/2017  . Fever blister 12/17/2017  . Tobacco use disorder, moderate, in sustained remission 12/17/2017  . Thrombocytopenia ([employer]) 12/12/2017  . Goals of care, counseling/discussion 12/12/2017  . Other fatigue 12/12/2017  . Asthma 11/30/2017  . Cataract 11/30/2017  . 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 Edema 11/30/2017  . Essential hypertension 11/30/2017  . Hyperlipidemia associated with type 2 diabetes mellitus ([place]) 11/30/2017  . Osteoarthritis 11/30/2017  . 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 MDS (myelodysplastic syndrome) with 5q deletion ([place]) 11/30/2017     Past Medical History:  Diagnosis Date  . Anemia   . B12 deficiency 06/10/2018  . Clotting disorder ([place])   . Diabetes mellitus without complication ([place])   . IBS (irritable bowel syndrome)   . Iron deficiency anemia due to chronic blood loss 12/12/2017  . MDS (myelodysplastic syndrome) ([place])   . MDS (myelodysplastic syndrome) ([place])   . Thyroid disease      Past Surgical History:  Procedure Laterality Date  . APPENDECTOMY    . breast biopsy 36'    . CHOLECYSTECTOMY    . CORONARY ANGIOPLASTY WITH STENT PLACEMENT  2016   in [place] History   Socioeconomic History  . Marital status: Married    Spouse name: Not on file  . Number of children: Not on file  . Years of education: Not on file  . Highest education level: Not on file  Occupational History  . Occupation: retired  Tobacco Use  . Smoking status: Former Smoker    Packs/day: 1.00    Types:  Cigarettes    Quit date: 1999    Years since quitting: 22.1  . Smokeless tobacco: Never Used  Substance and Sexual Activity  . Alcohol use: Not Currently  . Drug use: Never  . Sexual activity: Yes  Other Topics Concern  . Not on file  Social History Narrative  . Not on file   Social  Determinants of Health   Financial Resource Strain:   . Difficulty of Paying Living Expenses: Not on file  Food Insecurity:   . Worried About Charity fundraiser in the Last Year: Not on file  . Ran Out of Food in the Last Year: Not on file  Transportation Needs:   . Lack of Transportation (Medical): Not on file  . Lack of Transportation (Non-Medical): Not on file  Physical Activity:   . Days of Exercise per Week: Not on file  . Minutes of Exercise per Session: Not on file  Stress:   . Feeling of Stress : Not on file  Social Connections:   . Frequency of Communication with Friends and Family: Not on file  . Frequency of Social Gatherings with Friends and Family: Not on file  . Attends Religious Services: Not on file  . Active Member of Clubs or Organizations: Not on file  . Attends Archivist Meetings: Not on file  . Marital Status: Not on file  Intimate Partner Violence:   . Fear of Current or Ex-Partner: Not on file  . Emotionally Abused: Not on file  . Physically Abused: Not on file  . Sexually Abused: Not on file     Family History  Adopted: Yes  Family history unknown: Yes     Current Facility-Administered Medications:  .  acetaminophen (TYLENOL) tablet 650 mg, 650 mg, Oral, Q6H PRN, 650 mg at 08/15/19 0936 **OR** acetaminophen (TYLENOL) suppository 650 mg, 650 mg, Rectal, Q6H PRN, Ivor Costa, MD .  aminocaproic acid (AMICAR) tablet 500 mg, 500 mg, Oral, Q6H, Ivor Costa, MD, 500 mg at 08/15/19 0924 .  ciprofloxacin (CIPRO) tablet 500 mg, 500 mg, Oral, BID, Ivor Costa, MD, 500 mg at 08/15/19 0925 .  feeding supplement (GLUCERNA SHAKE) (GLUCERNA SHAKE) liquid 237 mL, 237 mL, Oral, TID BM, Val Riles, MD, 237 mL at 08/15/19 1000 .  fluconazole (DIFLUCAN) tablet 200 mg, 200 mg, Oral, Daily, Ivor Costa, MD, 200 mg at 08/15/19 2505 .  heparin lock flush 100 unit/mL, 500 Units, Intracatheter, Daily PRN, Earlie Server, MD .  heparin lock flush 100 unit/mL, 250 Units,  Intracatheter, PRN, Earlie Server, MD .  hydrALAZINE (APRESOLINE) tablet 25 mg, 25 mg, Oral, TID PRN, Ivor Costa, MD .  insulin aspart (novoLOG) injection 0-9 Units, 0-9 Units, Subcutaneous, Q4H, Ivor Costa, MD, 1 Units at 08/15/19 [phone] .  insulin aspart protamine- aspart (NOVOLOG MIX 70/30) injection 5 Units, 5 Units, Subcutaneous, BID WC, Ivor Costa, MD, 5 Units at 08/15/19 [phone] .  levothyroxine (SYNTHROID) tablet 50 mcg, 50 mcg, Oral, QAC breakfast, Ivor Costa, MD, 50 mcg at 08/15/19 0507 .  loperamide (IMODIUM) capsule 2 mg, 2 mg, Oral, PRN, Ivor Costa, MD .  multivitamin with minerals tablet 1 tablet, 1 tablet, Oral, Daily, Val Riles, MD, 1 tablet at 08/15/19 [phone] .  nystatin (MYCOSTATIN/NYSTOP) topical powder 1 application, 1 application, Topical, BID PRN, Ivor Costa, MD .  ondansetron (ZOFRAN) tablet 4 mg, 4 mg, Oral, Q6H PRN **OR** ondansetron (ZOFRAN) injection 4 mg, 4 mg, Intravenous, Q6H PRN, Ivor Costa, MD .  oxyCODONE (Oxy IR/ROXICODONE) immediate  release tablet 5 mg, 5 mg, Oral, Q6H PRN, Val Riles, MD, 5 mg at 08/15/19 1148 .  pantoprazole (PROTONIX) EC tablet 40 mg, 40 mg, Oral, Daily, Val Riles, MD, 40 mg at 08/15/19 0925 .  sodium chloride flush (NS) 0.9 % injection 10 mL, 10 mL, Intracatheter, PRN, Earlie Server, MD .  sodium chloride flush (NS) 0.9 % injection 3 mL, 3 mL, Intracatheter, PRN, Earlie Server, MD .  sulfaSALAzine (AZULFIDINE) EC tablet 500 mg, 500 mg, Oral, QHS, Ivor Costa, MD, 500 mg at 08/14/19 2106 .  valACYclovir (VALTREX) tablet 500 mg, 500 mg, Oral, BID, Ivor Costa, MD, 500 mg at 08/15/19 0925 .  vitamin B-12 (CYANOCOBALAMIN) tablet 1,000 mcg, 1,000 mcg, Oral, Daily, Ivor Costa, MD, 1,000 mcg at 08/15/19 0925   Physical exam:  Vitals:   08/14/19 2153 08/15/19 0425 08/15/19 0500 08/15/19 1150  BP: (!) 148/55 (!) 145/48  (!) 140/48  Pulse: 78 82  76  Resp: 14 20  17   Temp: 98.7 F (37.1 C) 99 F (37.2 C)  98.5 F (36.9 C)  TempSrc: Oral Oral  Oral  SpO2:  98% 99%  98%  Weight:   172 lb 6.4 oz (78.2 kg)   Height:       Physical Exam  Constitutional: She is oriented to person, place, and time. No distress.  HENT:  Head: Normocephalic and atraumatic.  Mouth/Throat: No oropharyngeal exudate.  Eyes: Pupils are equal, round, and reactive to light. EOM are normal. No scleral icterus.  Cardiovascular: Normal rate and regular rhythm.  Pulmonary/Chest: Effort normal.  Abdominal: Soft.  Musculoskeletal:     Cervical back: Normal range of motion and neck supple.     Comments: Left wrist pain with motion.  Neurological: She is alert and oriented to person, place, and time.  Skin: Skin is warm and dry. She is not diaphoretic. No erythema. There is pallor.  Scattered bruises  Psychiatric: Affect normal.       CMP Latest Ref Rng & Units 08/15/2019  Glucose 70 - 99 mg/dL 157(H)  BUN 8 - 23 mg/dL 17  Creatinine 0.44 - 1.00 mg/dL 0.98  Sodium 135 - 145 mmol/L 139  Potassium 3.5 - 5.1 mmol/L 4.4  Chloride 98 - 111 mmol/L 108  CO2 22 - 32 mmol/L 24  Calcium 8.9 - 10.3 mg/dL 8.4(L)  Total Protein 6.5 - 8.1 g/dL -  Total Bilirubin 0.3 - 1.2 mg/dL -  Alkaline Phos 38 - 126 U/L -  AST 15 - 41 U/L -  ALT 0 - 44 U/L -   CBC Latest Ref Rng & Units 08/15/2019  WBC 4.0 - 10.5 K/uL 1.5(L)  Hemoglobin 12.0 - 15.0 g/dL 8.5(L)  Hematocrit 36.0 - 46.0 % 27.1(L)  Platelets 150 - 400 K/uL 17(LL)    RADIOGRAPHIC STUDIES: I have personally reviewed the radiological images as listed and agreed with the findings in the report.   DG Wrist Complete Left  Result Date: 08/14/2019 CLINICAL DATA:  79 year old female with a history of wrist pain EXAM: LEFT WRIST - COMPLETE 3+ VIEW COMPARISON:  None. FINDINGS: Diffuse osteopenia. The scaphoid view demonstrates a linear lucency through the waist of the scaphoid, potentially a nondisplaced fracture though limited by the osteopenia. Advanced degenerative changes at the first carpometacarpal joint. Chondral calcifications  at the radiocarpal and ulnar carpal joints. The arcs of the wrist are maintained. Unremarkable appearance of the distal radius and ulna. IMPRESSION: There is a questionable nondisplaced fracture of the scaphoid. If the patient  has a history of wrist injury and/or point tenderness at the snuffbox, further evaluation with MR may be considered. Diffuse osteopenia. Degenerative changes, including chondral calcifications. Electronically Signed   By: Corrie Mckusick D.O.   On: 08/14/2019 10:04   DG Wrist Complete Left  Result Date: 08/07/2019 CLINICAL DATA:  Pain, swelling left wrist.  No known injury. EXAM: LEFT WRIST - COMPLETE 3+ VIEW COMPARISON:  None. FINDINGS: Advanced osteoarthritis at the 1st carpometacarpal joint and radiocarpal joint. No acute bony abnormality. Specifically, no fracture, subluxation, or dislocation. IMPRESSION: Advanced osteoarthritis.  No acute bony abnormality. Electronically Signed   By: Rolm Baptise M.D.   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 She recently progressed.  Recent smear showed scattered immature granulocytes are noted, less than 10%. She has had second opinion at [facility]  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 We will continue supportive care for pancytopenia. Symptomatic anemia, improved.  Status post 1 unit of PRBC transfusion yesterday.  Hemoglobin improved to 8.5 today.  Platelet Thrombocytopenia, in the context of recent rectal bleeding, would like to keep her platelet count above 15,000.  Today her counts is 17,000.  Continue Amicar. Neutropenia, continue antibiotic prophylaxis with fluconazole, Cipro, Valtrex.  #Left wrist pain, questionable nondisplaced  fracture of the scaphoid. Patient has MRI wrist pending.  Awaiting orthopedic evaluation.  From oncology aspect, patient can be discharged with follow-up with me on 08/18/2019 1145 appointment. Code Status DNR  Thank you for allowing me to participate in the care of this patient.   Earlie Server, MD, PhD Hematology Oncology [facility] Pager- [phone] 08/15/2019

## 2019-08-15 NOTE — Care Management Important Message (Signed)
Important Message  Patient Details  Name: Yesenia Hart MRN: [id number] Date of Birth: Sep 10, 1940   Medicare Important Message Given:  Yes     Dannette Barbara 08/15/2019, 10:59 AM

## 2019-08-15 NOTE — TOC Initial Note (Signed)
Transition of Care [facility]) - Initial/Assessment Note    Patient Details  Name: Yesenia Hart MRN: [id number] Date of Birth: July 13, 1940  [facility] of [facility] CM/SW Contact:    Beverly Sessions, RN Phone Number: 08/15/2019, 3:54 PM  Clinical Narrative:                 Patient to discharge home today Patient states that she lives at home with her husband  PCP [facility] - denies issues obtaining medications Daughter provides transportation  Patient states that she has a rollator in the home  MD has order home health services at discharge.  Patient agreeable to services states that she doesn't have a preference of agency.  Referral made to [facility] with [place]   Expected Discharge Plan: [place] Barriers to Discharge: No Barriers Identified   Patient Goals and CMS Choice   CMS Medicare.gov Compare Post Acute Care list provided to:: Patient Choice offered to / list presented to : Patient  Expected Discharge Plan and Services Expected Discharge Plan: [place] In-house Referral: Clinical Social Work     Living arrangements for the past 2 months: Apartment Expected Discharge Date: 08/15/19                         HH Arranged: PT, RN, OT, Nurse's Aide [place] Agency: [place] ([place]) Date HH Agency Contacted: 08/15/19   Representative spoke with at [place]: Corene Cornea  Prior Living Arrangements/Services Living arrangements for the past 2 months: Apartment Lives with:: Spouse Patient language and need for interpreter reviewed:: Yes Do you feel safe going back to the place where you live?: Yes      Need for Family Participation in Patient Care: Yes (Comment) Care giver support system in place?: Yes (comment) Current home services: DME Criminal Activity/Legal Involvement Pertinent to Current Situation/Hospitalization: No - Comment as needed  Activities of Daily Living Home Assistive  Devices/Equipment: [place] Rile (specify type) ADL Screening (condition at time of admission) Patient's cognitive ability adequate to safely complete daily activities?: Yes Is the patient deaf or have difficulty hearing?: No Does the patient have difficulty seeing, even when wearing glasses/contacts?: No Does the patient have difficulty concentrating, remembering, or making decisions?: No Patient able to express need for assistance with ADLs?: Yes Does the patient have difficulty dressing or bathing?: No Independently performs ADLs?: Yes (appropriate for developmental age) Does the patient have difficulty walking or climbing stairs?: No Weakness of Legs: None Weakness of Arms/Hands: Left  Permission Sought/Granted                  Emotional Assessment       Orientation: : Oriented to Self, Oriented to Place, Oriented to  Time, Oriented to Situation      Admission diagnosis:  GIB (gastrointestinal bleeding) [K92.2] BRBPR (bright red blood per rectum) [K62.5] Pancytopenia ([place]) [W97.989] Patient Active Problem List   Diagnosis Date Noted  . Symptomatic anemia   . GIB (gastrointestinal bleeding) 08/12/2019  . CKD (chronic kidney disease), stage III 08/12/2019  . Neutropenia ([place]) 12/24/2018  . Pancytopenia ([place]) 12/24/2018  . MDS (myelodysplastic syndrome), high grade ([place]) 11/18/2018  . Gastroesophageal reflux disease 07/19/2018  . B12 deficiency 06/10/2018  . 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 CAD (coronary artery disease), native coronary artery 03/15/2018  . Drug-induced neutropenia ([place]) 01/14/2018  . Type II diabetes mellitus with renal  manifestations ([place]) 12/17/2017  . Fever blister 12/17/2017  . Tobacco use disorder, moderate, in sustained remission 12/17/2017  . Thrombocytopenia ([place]) 12/12/2017  . Goals of care, counseling/discussion 12/12/2017  . Other fatigue 12/12/2017  . Asthma 11/30/2017  . Cataract 11/30/2017  . Diverticular  disease of colon 11/30/2017  . Edema 11/30/2017  . Essential hypertension 11/30/2017  . Hyperlipidemia associated with type 2 diabetes mellitus ([place]) 11/30/2017  . Osteoarthritis 11/30/2017  . 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 MDS (myelodysplastic syndrome) with 5q deletion ([place]) 11/30/2017   PCP:  Glean Hess, MD Pharmacy:   [place], [place] [address] Phone: [phone] Fax: [phone]     Social Determinants of Health (SDOH) Interventions    Readmission Risk Interventions No flowsheet data found.

## 2019-08-15 NOTE — Evaluation (Signed)
Occupational Therapy Evaluation Patient Details Name: Yesenia Hart MRN: [id number] DOB: 01-30-1941 Today's Date: 08/15/2019    History of Present Illness 79yo female pt admitted 08/12/19 due to platelet count of 10,000 and hemoglobin of 6.7 as well as rectal bleeding.   She was then found to have a GI bleed after admission.  Endoscopy was offered, the patient declined. Recently presented to ED on 1/28 with L wrist pain. Imaging reveals no fracture, per ortho surgery pt may be WBAT to LUE, suggestive of significant degenerative changes and/or CPPD/pseudogout. PMHx includes anemia, clotting disorder, DM, IBS, MDS, thyroid disease.   Clinical Impression   Pt was seen for OT evaluation this date. Prior to hospital admission, pt was indep with mobility [place], indep with ADL. [place] staff provide meals and cleaning. Daughter provides transportation. Pt reports 5/10 constant pain in L wrist with and without movement. Noted redness and edema to wrist and thumb, limiting her ability to functionally use her hand for ADL tasks. Increased time/effort to perform bed mobility with heavy bed rail use. Min A for transfer with RW with cues for hand placement. CGA to ambulate with RW with L hand gently resting on RW. Unsteady without RW, HHA provided and pt does fairly well. LUE positioned on pillow to support edema mgt. Pt educated in hemi techniques, transfers, and edema mgt. Pt would benefit from skilled OT to address noted impairments and functional limitations (see below for any additional details) in order to maximize safety and independence while minimizing falls risk and caregiver burden. Upon hospital discharge, recommend [place] and 24/7 supervision/assist for safety with all ADL/mobility to support pt safety and return to functional independence during meaningful occupations of daily life.     Follow Up Recommendations  Home health OT;Supervision/Assistance - 24 hour     Equipment Recommendations  3 in 1 bedside commode    Recommendations for Other Services       Precautions / Restrictions Precautions Precautions: Fall Restrictions Weight Bearing Restrictions: Yes LUE Weight Bearing: Weight bearing as tolerated      Mobility Bed Mobility Overal bed mobility: Needs Assistance Bed Mobility: Supine to Sit     Supine to sit: Min guard     General bed mobility comments: increased time/effort to perform, heavy use of bed rails and CGA  Transfers Overall transfer level: Needs assistance Equipment used: Rolling walker (2 wheeled) Transfers: Sit to/from Stand Sit to Stand: Min assist         General transfer comment: cues for hand placement and L forearm resting on RW    Balance Overall balance assessment: Needs assistance Sitting-balance support: No upper extremity supported;Feet supported Sitting balance-Leahy Scale: Good     Standing balance support: Bilateral upper extremity supported Standing balance-Leahy Scale: Poor Standing balance comment: required UE support RW vs HHA for stability                           ADL either performed or assessed with clinical judgement   ADL Overall ADL's : Needs assistance/impaired                                       General ADL Comments: Min-Mod A for LB ADL, Min A for UB ADL, and assist for bilat UE tasks such as set up for meals and grooming 2/2 impaired  LUE functional use     Vision Baseline Vision/History: Wears glasses Wears Glasses: At all times Patient Visual Report: No change from baseline       Perception     Praxis      Pertinent Vitals/Pain Pain Assessment: 0-10 Pain Score: 5  Pain Location: L wrist Pain Descriptors / Indicators: Aching;Constant;Guarding Pain Intervention(s): Limited activity within patient's tolerance;Monitored during session;Repositioned     Hand Dominance Right   Extremity/Trunk Assessment Upper Extremity  Assessment Upper Extremity Assessment: Generalized weakness;LUE deficits/detail(RUE grossly 3+ to 4-/5) LUE Deficits / Details: redness and swelling at wrist, swelling in fingers/hand, especially around thumb CMC joint, very minimal wrist ROM 2/2 pain, unable to fully perform composite finger flexion 2/2 pain and swelling LUE: Unable to fully assess due to pain LUE Sensation: WNL LUE Coordination: decreased fine motor;decreased gross motor   Lower Extremity Assessment Lower Extremity Assessment: Generalized weakness(grossly 3+/5 bilat, hx R ankle fusion)       Communication Communication Communication: No difficulties   Cognition Arousal/Alertness: Awake/alert Behavior During Therapy: WFL for tasks assessed/performed Overall Cognitive Status: Within Functional Limits for tasks assessed                                     General Comments  LUE repositioned on pillow to support edema mgt and comfort    Exercises Other Exercises Other Exercises: Pt instructed in hemi techniques for ADL, falls prevention strategies, pursed lip breathing to support breath recovery after exertional activity   Shoulder Instructions      Home Living Family/patient expects to be discharged to:: Other (Comment)([place]) Living Arrangements: Spouse/significant other Available Help at Discharge: Family;Available 24 hours/day Type of Home: Apartment Home Access: Elevator     Home Layout: One level     Bathroom Shower/Tub: Occupational psychologist: Handicapped height     Home Equipment: Clinical cytogeneticist - 4 wheels;Grab bars - tub/shower;Hand held shower head          Prior Functioning/Environment Level of Independence: Independent with assistive device(s)        Comments: indep with HH mobility, used rollator outside the apartment, indep with ADL, meals and cleaning covered by [place] staff, spouse drives        OT Problem List: Decreased strength;Decreased range of  motion;Increased edema;Pain;Decreased activity tolerance;Impaired balance (sitting and/or standing);Decreased knowledge of use of DME or AE;Impaired UE functional use      OT Treatment/Interventions:      OT Goals(Current goals can be found in the care plan section) Acute Rehab OT Goals Patient Stated Goal: go home and get better OT Goal Formulation: All assessment and education complete, DC therapy  OT Frequency:     Barriers to D/C:            Co-evaluation              AM-PAC OT "6 Clicks" Daily Activity     Outcome Measure Help from another person eating meals?: A Little Help from another person taking care of personal grooming?: A Little Help from another person toileting, which includes using toliet, bedpan, or urinal?: A Little Help from another person bathing (including washing, rinsing, drying)?: A Little Help from another person to put on and taking off regular upper body clothing?: A Little Help from another person to put on and taking off regular lower body clothing?: A Little 6 Click Score: 18  End of Session Equipment Utilized During Treatment: Gait belt;Rolling walker  Activity Tolerance: Patient tolerated treatment well Patient left: in chair;with call bell/phone within reach;with chair alarm set  OT Visit Diagnosis: Other abnormalities of gait and mobility (R26.89);Muscle weakness (generalized) (M62.81);Pain Pain - Right/Left: Left Pain - part of body: Hand;Arm                Time: 1541-1616 OT Time Calculation (min): 35 min Charges:  OT General Charges $OT Visit: 1 Visit OT Evaluation $OT Eval Moderate Complexity: 1 Mod OT Treatments $Self Care/Home Management : 8-22 mins  [name], MPH, MS, OTR/L ascom [phone] 08/15/19, 4:51 PM

## 2019-08-15 NOTE — Progress Notes (Signed)
OT Cancellation Note  Patient Details Name: Yesenia Hart MRN: [id number] DOB: 28-Feb-1941   Cancelled Treatment:    Reason Eval/Treat Not Completed: Medical issues which prohibited therapy. Consult received and chart reviewed. Pt with L UE scaphoid fx. Pending Ortho consult, MRI of wrist, blood transfusion (apparent issues with blood bank). At this time will hold evaluation. Will need further clarification from ortho regarding splint needed and any WBing precautions prior to initiating OT evaluation. Thank you for the consult.  [name], MPH, MS, OTR/L ascom [phone] 08/15/19, 11:41 AM

## 2019-08-15 NOTE — Progress Notes (Signed)
PT Cancellation Note  Patient Details Name: Yesenia Hart MRN: [id number] DOB: Nov 27, 1940   Cancelled Treatment:    Reason Eval/Treat Not Completed: Other (comment). Consult received and chart reviewed. Pt with L UE scaphoid fx. Pending Ortho consult, MRI of wrist, blood transfusion (apparent issues with blood bank). At this time will hold evaluation. Will need further clarification from ortho regarding splint needed and any WBing precautions prior to initiating PT evaluation. Thank you for the consult.   Norrine Ballester 08/15/2019, 11:04 AM  Greggory Stallion, PT, DPT [phone]

## 2019-08-15 NOTE — Discharge Summary (Signed)
[facility] Discharge Summary   Patient: Yesenia Hart OEV:[id number]  PCP: Glean Hess, MD  Date of admission: 08/12/2019   Date of discharge:  08/15/2019     Discharge Diagnoses:  Principal Problem:   GIB (gastrointestinal bleeding) Active Problems:   Essential hypertension   Hyperlipidemia associated with type 2 diabetes mellitus ([place])   Type II diabetes mellitus with renal manifestations ([place])   Hypothyroidism due to acquired atrophy of thyroid   CAD (coronary artery disease), native coronary artery   Gastroesophageal reflux disease   MDS (myelodysplastic syndrome), high grade (HCC)   Pancytopenia ([place])   CKD (chronic kidney disease), stage III   Symptomatic anemia   Admitted From: Home Disposition:  Home   Recommendations for Outpatient Follow-up:  1. PCP: PCP in 1 week 2. Follow-up with urologist in 1 week for blood transfusion as needed, patient is to repeat CBC next week   Diet recommendation: Carb modified diet  Activity: The patient is advised to gradually reintroduce usual activities, as tolerated  Discharge Condition: stable  Code Status: DNR   History of present illness: As per the H and P dictated on admission.   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  Per her daughter (I called her daughter by phone), patient had 2 episode of bright red rectal bleeding today. Patient does not have nausea vomiting or abdominal pain. She has history of MDS.She had 1 unit of blood transfusion on Thursday per her daughter. Denies chest pain, shortness breath, cough, fever or chills. No symptoms of UTI. Daughter states that patient injured her left wrist on Thursday night,  imaging did not show fracture. Patient still has some pain and swelling in the left wrist. Pt is onmultiple antibiotics for prophylaxis (Cipro, Diflucan, Valtrex).  ED Course:pt was found to have pancytopeniawithWBC 1.2,Hgb7.6 andplatelets 6(herWBCwas81.5,hemoglobin 6.7andplatelet 10.0 on 81/28), negative COVID-19 PCR, stable renal function, temperature 98.4, blood pressure 130/47, heart rate 73, oxygen saturation 97% on room air. Patient is admitted to [place] bed as inpatient.  Assessment and plan # GIB (gastrointestinal bleeding):Patient has pancytopenia.WBC 1.2,Hgb7.6 andplatelets 6(herWBCwas81.5,hemoglobin 6.7andplatelet 10.0 on 1/28). GI was consulted. Dr. Marius Ditch has singed off since ptdoes not want to do endoscopy. continue Amicar, s/p  transfuse1unit of platelet and 1 unit of blood,  s/p IVF:21m/hr, d/c, - s/p pantoprazole40 mg bid, changed to p.o. daily. Avoided NSAIDs and SQ heparin Monitored closely q6h cbc was done. LaB: INR, PTT and type screen. On 08/13/2019 platelet less than 10K so transfuse 1 unit of platelet Patient has history of ulcerative colitis, continued sulfasalazine, PTA Dose  MDS (myelodysplastic syndrome), high gradeandpancytopenia: Per her daughter, patient stopped chemotherapy since she is no longer responding to treatment. -Pt is onmultiple antibiotics for prophylaxis (Cipro, Diflucan, Valtrex). -f/u with Dr. YTasia Catchingsof oncology, consulted, discussed with patient's daughter who does not want any extensive medical management.  Keep platelet count above 15,000 and discharge planning follow-up as an outpatient for transfusion with oncologist.  Left wrist swelling, POA 08/07/2019 x-ray left wrist: Advanced osteoarthritis. No acute bony abnormality. 08/14/2019 x-ray left wrist: nondisplaced fracture of the scaphoid ??  Consider MRI if history of injury, diffuse osteopenia.Degenerative changes, including chondral calcifications MRI left wrist:  Diffuse degenerative changes, inflammatory changes most likely due to CPPD/pseudogout or RA. Orthopedics consulted, recommended NSAIDs if no contraindication otherwise  prednisone, no cast and no surgical intervention. Started prednisone 40 mg p.o. daily for 5 days, recommended to follow with PCP. #Essential hypertension and HLD : Home medications were held due to risk of hypotension, resumed on discharge.  Patient was advised to monitor BP at home. #Type II diabetes mellitus with renal manifestations:Most recent A1c6.4 on 04/21/19, controled. Patient is takingAmaryl, 70/30 insulinat home, resumed home dose insulin and medications, patient was advised to monitor blood glucose at home and follow with PCP.  Continue diabetic diet. #Hypothyroidism due to acquired atrophy of thyroid: -Synthroid # CAD (coronary artery disease), native coronary artery: s/p of stent, resumed home medications. # Gastroesophageal reflux disease:-on protonix # CKD (chronic kidney disease), stage IIIa:stable   Currently further plan is to discharge patient home, CBC was monitored hemoglobin and platelets stable.  Cleared by hematologist to discharge home.  Seen by orthopedics recommended steroids far left wrist swelling possible CPPD/gout, cannot start NSAIDs due to low platelet and bleeding.  No need of cast. Patient seems to be stable to go home with home health services, social worker/case manager involved to arrange home care.   Body mass index is 29.59 kg/m.  Nutrition Problem: Inadequate oral intake Etiology: altered GI function, early satiety(GIB, hisotry of AVMs in colon and stomach) Nutrition Interventions: Interventions: MVI, Glucerna shake    No controlled R opiates pain control meds prescribed. - [place] Controlled Substance Reporting System database was not reviewed by me.. - Patient was instructed, not to drive, operate heavy machinery, perform activities at heights, swimming or  participation in water activities or provide baby sitting services while on Pain, Sleep and Anxiety Medications; until 79 outpatient Physician has advised to do so again.  - Also recommended to not to take more than prescribed Pain, Sleep and Anxiety Medications.  Patient was ambulatory with some assistance. Patient was not seen by physical therapy due to medical condition, patient will need home health services so case manager will arrange. On the day of the discharge the patient's vitals were stable, and no other acute medical condition were reported by patient. the patient was felt safe to be discharge at Home with Home health.  Consultants: Oncologist, GI, orthopedics Procedures: None  Discharge Exam: General: Appear in no distress, no Rash; Oral Mucosa Clear, moist. Cardiovascular: S1 and S2 Present, no Murmur, Respiratory: normal respiratory effort, Bilateral Air entry present and no Crackles, no wheezes Abdomen: Bowel Sound present, Soft and no tenderness, no hernia Extremities: no Pedal edema, no calf tenderness, to rest swelling and tenderness Neurology: alert and oriented to time, place, and person affect appropriate.  Filed Weights   08/13/19 0500 08/14/19 0540 08/15/19 0500  Weight: 74.4 kg 79.9 kg 78.2 kg   Vitals:   08/15/19 0425 08/15/19 1150  BP: (!) 145/48 (!) 140/48  Pulse: 82 76  Resp: 20 17  Temp: 99 F (37.2 C) 98.5 F (36.9 C)  SpO2: 99% 98%    DISCHARGE MEDICATION: Allergies as of 08/15/2019      Reactions   Meperidine Nausea And Vomiting   Pioglitazone Swelling   Other reaction(s): Edema   Acarbose Diarrhea      Medication List    TAKE these medications   aminocaproic acid 500 MG tablet Commonly known as: Amicar Take 1 tablet (500 mg total) by mouth every 6 (six) hours.   atorvastatin 20 MG tablet Commonly known as: LIPITOR TAKE 1 TABLET BY MOUTH ONCE DAILY What changed: when to take this   ciprofloxacin 500 MG tablet  Commonly known as:  Cipro Take 1 tablet (500 mg total) by mouth 2 (two) times daily.   fluconazole 200 MG tablet Commonly known as: DIFLUCAN Take 200 mg by mouth daily.   furosemide 20 MG tablet Commonly known as: LASIX TAKE 1 TABLET BY MOUTH ONCE DAILY   glimepiride 4 MG tablet Commonly known as: AMARYL TAKE 1 TABLET BY MOUTH ONCE DAILY   GNP Vitamin B-12 1000 MCG Tbcr Generic drug: Cyanocobalamin Take 1 tablet (1,000 mcg total) by mouth daily.   levothyroxine 50 MCG tablet Commonly known as: SYNTHROID TAKE 1 TABLET BY MOUTH ONCE DAILY ON AN EMPTY STOMACH. WAIT 30 MINUTES BEFORE TAKING OTHER MEDS. What changed: See the new instructions.   loperamide 2 MG capsule Commonly known as: IMODIUM Take 2 mg by mouth as needed for diarrhea or loose stools.   multivitamin with minerals Tabs tablet Take 1 tablet by mouth daily. Start taking on: August 16, 2019   NovoLOG Mix 70/30 FlexPen (70-30) 100 UNIT/ML FlexPen Generic drug: insulin aspart protamine - aspart Inject 0.08 mLs (8 Units total) into the skin 2 (two) times daily with a meal.   nystatin powder Commonly known as: MYCOSTATIN/NYSTOP Apply topically 2 (two) times daily as needed. What changed:   how much to take  reasons to take this   ondansetron 8 MG tablet Commonly known as: ZOFRAN TAKE 1 TABLET BY MOUTH TWICE DAILY AS NEEDED NAUSEA AND VOMITING What changed: See the new instructions.   OneTouch Ultra test strip Generic drug: glucose blood TEST TWICE DAILY   onetouch ultrasoft lancets USE AS DIRECTED.   pantoprazole 40 MG tablet Commonly known as: PROTONIX TAKE 1 TABLET BY MOUTH ONCE DAILY   predniSONE 20 MG tablet Commonly known as: DELTASONE Take 2 tablets (40 mg total) by mouth daily with breakfast for 5 days.   quinapril 20 MG tablet Commonly known as: ACCUPRIL TAKE 1 TABLET BY MOUTH ONCE DAILY   sulfaSALAzine 500 MG tablet Commonly known as: AZULFIDINE TAKE 1 TABLET BY MOUTH AT BEDTIME   UltiCare Micro Pen  Needles 32G X 4 MM Misc Generic drug: Insulin Pen Needle Inject 1 each into the skin 2 (two) times daily.   valACYclovir 500 MG tablet Commonly known as: VALTREX Take 1 tablet (500 mg total) by mouth 2 (two) times daily.      Allergies  Allergen Reactions  . Meperidine Nausea And Vomiting  . Pioglitazone Swelling    Other reaction(s): Edema  . Acarbose Diarrhea   Discharge Instructions    Diet - low sodium heart healthy   Complete by: As directed    Discharge instructions   Complete by: As directed    Follow-up with PCP in 1 week for swelling of left hand and diabetes management. Follow-up with hematologist, repeat CBC next week and continue PRBC and platelet transfusion as per hematology recommendation.  Patient prescribed ciprofloxacin outpatient, doubt whether she needs a prolonged course?? follow with hematologist.   Increase activity slowly   Complete by: As directed       The results of significant diagnostics from this hospitalization (including imaging, microbiology, ancillary and laboratory) are listed below for reference.    Significant Diagnostic Studies: DG Wrist Complete Left  Result Date: 08/14/2019 CLINICAL DATA:  79 year old female with a history of wrist pain EXAM: LEFT WRIST - COMPLETE 3+ VIEW COMPARISON:  None. FINDINGS: Diffuse osteopenia. The scaphoid view demonstrates a linear lucency through the waist of the scaphoid, potentially a nondisplaced fracture though limited by the  osteopenia. Advanced degenerative changes at the first carpometacarpal joint. Chondral calcifications at the radiocarpal and ulnar carpal joints. The arcs of the wrist are maintained. Unremarkable appearance of the distal radius and ulna. IMPRESSION: There is a questionable nondisplaced fracture of the scaphoid. If the patient has a history of wrist injury and/or point tenderness at the snuffbox, further evaluation with MR may be considered. Diffuse osteopenia. Degenerative changes,  including chondral calcifications. Electronically Signed   By: Corrie Mckusick D.O.   On: 08/14/2019 10:04   DG Wrist Complete Left  Result Date: 08/07/2019 CLINICAL DATA:  Pain, swelling left wrist.  No known injury. EXAM: LEFT WRIST - COMPLETE 3+ VIEW COMPARISON:  None. FINDINGS: Advanced osteoarthritis at the 1st carpometacarpal joint and radiocarpal joint. No acute bony abnormality. Specifically, no fracture, subluxation, or dislocation. IMPRESSION: Advanced osteoarthritis.  No acute bony abnormality. Electronically Signed   By: Rolm Baptise M.D.   On: 08/07/2019 21:45   MR WRIST LEFT WO CONTRAST  Result Date: 08/15/2019 CLINICAL DATA:  Right wrist pain and swelling. Possible scaphoid fracture. Abnormal x-ray EXAM: MR OF THE LEFT WRIST WITHOUT CONTRAST TECHNIQUE: Multiplanar, multisequence MR imaging of the left wrist was performed. No intravenous contrast was administered. COMPARISON:  X-ray 08/14/2019 FINDINGS: Technical note: Motion degraded examination. Ligaments: Suboptimally evaluated. No evidence of high-grade tear involving the scapholunate or lunotriquetral ligaments. Osseous alignment is maintained. Triangular fibrocartilage: Appears mildly degenerated, grossly intact. Small amount of fluid with suggestion of synovitis within the DRUJ. Tendons: Flexor and extensor tendons appear grossly intact. No appreciable tenosynovial fluid collections. Carpal tunnel/median nerve: No obvious abnormalities. Guyon's canal: Unremarkable. Joint/cartilage: Advanced degenerative changes throughout the wrist, most pronounced at the triscaphe and radiocarpal joints. Severe degenerative changes at the first carpometacarpal joint with chronic osseous fragmentation. Small effusions with synovitis in the proximal and mid carpal row. Bones/carpal alignment: Scattered subchondral cystic changes within the carpal bones most pronounced at the triquetrum and distal scaphoid pole, possibly representing small erosions. No acute  fracture identified. Specifically no scaphoid fracture is seen. No dislocation. Other: Circumferential soft tissue edema. No drainable fluid collection. IMPRESSION: 1. No scaphoid fracture identified. 2. Advanced degenerative changes throughout the wrist, most pronounced at the first carpometacarpal joint. Small effusions with synovitis throughout the wrist and DRUJ. Findings raise suspicion of an underlying crystalline or inflammatory arthropathy including CPPD and RA. Although felt to be less likely, an infectious arthropathy would be difficult to exclude. If there is clinical suspicion for infection, arthrocentesis should be performed. 3. Nonspecific circumferential soft tissue edema. Electronically Signed   By: Davina Poke D.O.   On: 08/15/2019 13:26    Microbiology: Recent Results (from the past 240 hour(s))  Respiratory Panel by RT PCR (Flu A&B, Covid) - Nasopharyngeal Swab     Status: None   Collection Time: 08/12/19 10:51 AM   Specimen: Nasopharyngeal Swab  Result Value Ref Range Status   SARS Coronavirus 2 by RT PCR NEGATIVE NEGATIVE Final    Comment: (NOTE) SARS-CoV-2 target nucleic acids are NOT DETECTED. The SARS-CoV-2 RNA is generally detectable in upper respiratoy specimens during the acute phase of infection. The lowest concentration of SARS-CoV-2 viral copies this assay can detect is 131 copies/mL. A negative result does not preclude SARS-Cov-2 infection and should not be used as the sole basis for treatment or other patient management decisions. A negative result may occur with  improper specimen collection/handling, submission of specimen other than nasopharyngeal swab, presence of viral mutation(s) within the areas targeted by this assay,  and inadequate number of viral copies (<131 copies/mL). A negative result must be combined with clinical observations, patient history, and epidemiological information. The expected result is Negative. Fact Sheet for Patients:    PinkCheek.be Fact Sheet for Healthcare Providers:  GravelBags.it This test is not yet ap proved or cleared by the [place] FDA and  has been authorized for detection and/or diagnosis of SARS-CoV-2 by FDA under an Emergency Use Authorization (EUA). This EUA will remain  in effect (meaning this test can be used) for the duration of the COVID-19 declaration under Section 564(b)(1) of the Act, 21 U.S.C. section 360bbb-3(b)(1), unless the authorization is terminated or revoked sooner.    Influenza A by PCR NEGATIVE NEGATIVE Final   Influenza B by PCR NEGATIVE NEGATIVE Final    Comment: (NOTE) The Xpert Xpress SARS-CoV-2/FLU/RSV assay is intended as an aid in  the diagnosis of influenza from Nasopharyngeal swab specimens and  should not be used as a sole basis for treatment. Nasal washings and  aspirates are unacceptable for Xpert Xpress SARS-CoV-2/FLU/RSV  testing. Fact Sheet for Patients: PinkCheek.be Fact Sheet for Healthcare Providers: GravelBags.it This test is not yet approved or cleared by the [place] FDA and  has been authorized for detection and/or diagnosis of SARS-CoV-2 by  FDA under an Emergency Use Authorization (EUA). This EUA will remain  in effect (meaning this test can be used) for the duration of the  Covid-19 declaration under Section 564(b)(1) of the Act, 21  U.S.C. section 360bbb-3(b)(1), unless the authorization is  terminated or revoked. Performed at [facility], [address] [place], [place] [address]      Labs: CBC: Recent Labs  Lab 08/13/19 1231 08/13/19 1846 08/14/19 0011 08/14/19 0612 08/15/19 0556  WBC 1.5* 1.3* 1.5* 1.4* 1.5*  NEUTROABS  --   --   --   --  0.3*  HGB 8.4* 7.9* 7.7* 7.4* 8.5*  HCT 26.9* 25.4* 25.0* 24.4* 27.1*  MCV 90.0 90.1 90.3 91.0 89.4  PLT 7* 7* 6* 15* 17*   Basic Metabolic  Panel: Recent Labs  Lab 08/12/19 0914 08/13/19 0629 08/14/19 0612 08/15/19 0556  NA 138 141 137 139  K 4.2 4.1 4.0 4.4  CL 105 109 108 108  CO2 21* 23 22 24   GLUCOSE 265* 100* 169* 157*  BUN 21 20 19 17   CREATININE 1.10* 0.85 0.94 0.98  CALCIUM 8.6* 8.1* 8.1* 8.4*  MG  --   --  1.8  --   PHOS  --   --  3.5  --    Liver Function Tests: Recent Labs  Lab 08/12/19 0914  AST 21  ALT 16  ALKPHOS 89  BILITOT 1.2  PROT 6.6  ALBUMIN 3.2*   No results for input(s): LIPASE, AMYLASE in the last 168 hours. No results for input(s): AMMONIA in the last 168 hours. Cardiac Enzymes: No results for input(s): CKTOTAL, CKMB, CKMBINDEX, TROPONINI in the last 168 hours. BNP (last 3 results) No results for input(s): BNP in the last 8760 hours. CBG: Recent Labs  Lab 08/14/19 2329 08/15/19 0348 08/15/19 0751 08/15/19 1150 08/15/19 1534  GLUCAP 234* 156* 145* 183* 188*    Time spent: 35 minutes  Signed:  Val Riles  [facility]  08/15/2019 3:54 PM

## 2019-08-16 LAB — ERYTHROPOIETIN: Erythropoietin: 83.2 m[IU]/mL — ABNORMAL HIGH (ref 2.6–18.5)

## 2019-08-17 ENCOUNTER — Other Ambulatory Visit: Payer: Self-pay

## 2019-08-17 ENCOUNTER — Observation Stay
Admission: EM | Admit: 2019-08-17 | Discharge: 2019-08-18 | Disposition: A | Discharge status: Home / Self Care | Payer: Medicare Other | Attending: Hospitalist | Admitting: Hospitalist

## 2019-08-17 DIAGNOSIS — K219 Gastro-esophageal reflux disease without esophagitis: Secondary | ICD-10-CM | POA: Diagnosis not present

## 2019-08-17 DIAGNOSIS — D696 Thrombocytopenia, unspecified: Secondary | ICD-10-CM | POA: Insufficient documentation

## 2019-08-17 DIAGNOSIS — Z885 Allergy status to narcotic agent status: Secondary | ICD-10-CM | POA: Diagnosis not present

## 2019-08-17 DIAGNOSIS — D469 Myelodysplastic syndrome, unspecified: Secondary | ICD-10-CM | POA: Diagnosis present

## 2019-08-17 DIAGNOSIS — Z79899 Other long term (current) drug therapy: Secondary | ICD-10-CM | POA: Insufficient documentation

## 2019-08-17 DIAGNOSIS — I2510 Atherosclerotic heart disease of native coronary artery without angina pectoris: Secondary | ICD-10-CM | POA: Insufficient documentation

## 2019-08-17 DIAGNOSIS — D46C Myelodysplastic syndrome with isolated del(5q) chromosomal abnormality: Secondary | ICD-10-CM | POA: Diagnosis not present

## 2019-08-17 DIAGNOSIS — Z888 Allergy status to other drugs, medicaments and biological substances status: Secondary | ICD-10-CM | POA: Diagnosis not present

## 2019-08-17 DIAGNOSIS — I10 Essential (primary) hypertension: Secondary | ICD-10-CM

## 2019-08-17 DIAGNOSIS — D500 Iron deficiency anemia secondary to blood loss (chronic): Secondary | ICD-10-CM | POA: Insufficient documentation

## 2019-08-17 DIAGNOSIS — Z794 Long term (current) use of insulin: Secondary | ICD-10-CM | POA: Insufficient documentation

## 2019-08-17 DIAGNOSIS — E1122 Type 2 diabetes mellitus with diabetic chronic kidney disease: Secondary | ICD-10-CM | POA: Diagnosis not present

## 2019-08-17 DIAGNOSIS — M25432 Effusion, left wrist: Secondary | ICD-10-CM | POA: Insufficient documentation

## 2019-08-17 DIAGNOSIS — K5190 Ulcerative colitis, unspecified, without complications: Secondary | ICD-10-CM | POA: Diagnosis not present

## 2019-08-17 DIAGNOSIS — K625 Hemorrhage of anus and rectum: Secondary | ICD-10-CM | POA: Diagnosis not present

## 2019-08-17 DIAGNOSIS — D61818 Other pancytopenia: Secondary | ICD-10-CM | POA: Diagnosis not present

## 2019-08-17 DIAGNOSIS — N1831 Chronic kidney disease, stage 3a: Secondary | ICD-10-CM | POA: Diagnosis not present

## 2019-08-17 DIAGNOSIS — Z66 Do not resuscitate: Secondary | ICD-10-CM | POA: Insufficient documentation

## 2019-08-17 DIAGNOSIS — I129 Hypertensive chronic kidney disease with stage 1 through stage 4 chronic kidney disease, or unspecified chronic kidney disease: Secondary | ICD-10-CM | POA: Diagnosis not present

## 2019-08-17 DIAGNOSIS — Z955 Presence of coronary angioplasty implant and graft: Secondary | ICD-10-CM | POA: Diagnosis not present

## 2019-08-17 DIAGNOSIS — R040 Epistaxis: Principal | ICD-10-CM | POA: Diagnosis present

## 2019-08-17 DIAGNOSIS — Z03818 Encounter for observation for suspected exposure to other biological agents ruled out: Secondary | ICD-10-CM | POA: Diagnosis not present

## 2019-08-17 DIAGNOSIS — E1169 Type 2 diabetes mellitus with other specified complication: Secondary | ICD-10-CM

## 2019-08-17 DIAGNOSIS — Z87891 Personal history of nicotine dependence: Secondary | ICD-10-CM | POA: Diagnosis not present

## 2019-08-17 DIAGNOSIS — E039 Hypothyroidism, unspecified: Secondary | ICD-10-CM | POA: Insufficient documentation

## 2019-08-17 DIAGNOSIS — Z7952 Long term (current) use of systemic steroids: Secondary | ICD-10-CM | POA: Diagnosis not present

## 2019-08-17 DIAGNOSIS — E785 Hyperlipidemia, unspecified: Secondary | ICD-10-CM

## 2019-08-17 DIAGNOSIS — Z79890 Hormone replacement therapy (postmenopausal): Secondary | ICD-10-CM | POA: Insufficient documentation

## 2019-08-17 LAB — CBC WITH DIFFERENTIAL/PLATELET
Abs Immature Granulocytes: 0.11 10*3/uL — ABNORMAL HIGH (ref 0.00–0.07)
Basophils Absolute: 0 10*3/uL (ref 0.0–0.1)
Basophils Relative: 1 %
Eosinophils Absolute: 0 10*3/uL (ref 0.0–0.5)
Eosinophils Relative: 0 %
HCT: 28 % — ABNORMAL LOW (ref 36.0–46.0)
Hemoglobin: 8.8 g/dL — ABNORMAL LOW (ref 12.0–15.0)
Immature Granulocytes: 6 %
Lymphocytes Relative: 42 %
Lymphs Abs: 0.8 10*3/uL (ref 0.7–4.0)
MCH: 28.2 pg (ref 26.0–34.0)
MCHC: 31.4 g/dL (ref 30.0–36.0)
MCV: 89.7 fL (ref 80.0–100.0)
Monocytes Absolute: 0.3 10*3/uL (ref 0.1–1.0)
Monocytes Relative: 15 %
Neutro Abs: 0.6 10*3/uL — ABNORMAL LOW (ref 1.7–7.7)
Neutrophils Relative %: 36 %
Platelets: 10 10*3/uL — CL (ref 150–400)
RBC: 3.12 MIL/uL — ABNORMAL LOW (ref 3.87–5.11)
RDW: 18.2 % — ABNORMAL HIGH (ref 11.5–15.5)
Smear Review: NORMAL
WBC: 1.8 10*3/uL — ABNORMAL LOW (ref 4.0–10.5)
nRBC: 3.4 % — ABNORMAL HIGH (ref 0.0–0.2)

## 2019-08-17 LAB — TYPE AND SCREEN
ABO/RH(D): B POS
Antibody Screen: NEGATIVE

## 2019-08-17 LAB — COMPREHENSIVE METABOLIC PANEL
ALT: 19 U/L (ref 0–44)
AST: 25 U/L (ref 15–41)
Albumin: 3 g/dL — ABNORMAL LOW (ref 3.5–5.0)
Alkaline Phosphatase: 86 U/L (ref 38–126)
Anion gap: 11 (ref 5–15)
BUN: 36 mg/dL — ABNORMAL HIGH (ref 8–23)
CO2: 19 mmol/L — ABNORMAL LOW (ref 22–32)
Calcium: 8.8 mg/dL — ABNORMAL LOW (ref 8.9–10.3)
Chloride: 106 mmol/L (ref 98–111)
Creatinine, Ser: 1.13 mg/dL — ABNORMAL HIGH (ref 0.44–1.00)
GFR calc Af Amer: 53 mL/min — ABNORMAL LOW (ref >60)
GFR calc non Af Amer: 46 mL/min — ABNORMAL LOW (ref >60)
Glucose, Bld: 301 mg/dL — ABNORMAL HIGH (ref 70–99)
Potassium: 4.1 mmol/L (ref 3.5–5.1)
Sodium: 136 mmol/L (ref 135–145)
Total Bilirubin: 0.8 mg/dL (ref 0.3–1.2)
Total Protein: 6.6 g/dL (ref 6.5–8.1)

## 2019-08-17 LAB — PROTIME-INR
INR: 1 (ref 0.8–1.2)
Prothrombin Time: 13.5 seconds (ref 11.4–15.2)

## 2019-08-17 LAB — APTT: aPTT: <24 seconds — ABNORMAL LOW (ref 24–36)

## 2019-08-17 MED ORDER — INSULIN ASPART PROT & ASPART (70-30 MIX) 100 UNIT/ML ~~LOC~~ SUSP
4.0000 [IU] | Freq: Two times a day (BID) | SUBCUTANEOUS | Status: DC
  Administered 2019-08-17 – 2019-08-18 (×2): 4 [IU] via SUBCUTANEOUS
  Filled 2019-08-17 (×3): qty 10

## 2019-08-17 MED ORDER — AMINOCAPROIC ACID 500 MG PO TABS
500.0000 mg | ORAL_TABLET | Freq: Four times a day (QID) | ORAL | Status: DC
  Administered 2019-08-17 – 2019-08-18 (×3): 500 mg via ORAL
  Filled 2019-08-17 (×5): qty 1

## 2019-08-17 MED ORDER — TRANEXAMIC ACID-NACL 1000-0.7 MG/100ML-% IV SOLN
1000.0000 mg | Freq: Once | INTRAVENOUS | Status: AC
  Administered 2019-08-17: 1000 mg via INTRAVENOUS
  Filled 2019-08-17: qty 100

## 2019-08-17 MED ORDER — PANTOPRAZOLE SODIUM 40 MG PO TBEC
40.0000 mg | DELAYED_RELEASE_TABLET | Freq: Every day | ORAL | Status: DC
  Administered 2019-08-18: 40 mg via ORAL
  Filled 2019-08-17: qty 1

## 2019-08-17 MED ORDER — LEVOTHYROXINE SODIUM 50 MCG PO TABS
50.0000 ug | ORAL_TABLET | Freq: Every day | ORAL | Status: DC
  Administered 2019-08-18: 50 ug via ORAL
  Filled 2019-08-17: qty 1

## 2019-08-17 MED ORDER — SULFASALAZINE 500 MG PO TBEC
500.0000 mg | DELAYED_RELEASE_TABLET | Freq: Every day | ORAL | Status: DC
  Administered 2019-08-17: 21:00:00 500 mg via ORAL
  Filled 2019-08-17 (×2): qty 1

## 2019-08-17 MED ORDER — NYSTATIN 100000 UNIT/GM EX POWD
1.0000 "application " | Freq: Two times a day (BID) | CUTANEOUS | Status: DC | PRN
  Filled 2019-08-17: qty 15

## 2019-08-17 MED ORDER — FLUCONAZOLE 100 MG PO TABS
200.0000 mg | ORAL_TABLET | Freq: Every day | ORAL | Status: DC
  Administered 2019-08-18: 09:00:00 200 mg via ORAL
  Filled 2019-08-17 (×2): qty 2

## 2019-08-17 MED ORDER — SODIUM CHLORIDE 0.9 % IV SOLN
10.0000 mL/h | Freq: Once | INTRAVENOUS | Status: AC
  Administered 2019-08-17: 10 mL/h via INTRAVENOUS

## 2019-08-17 MED ORDER — VALACYCLOVIR HCL 500 MG PO TABS
500.0000 mg | ORAL_TABLET | Freq: Two times a day (BID) | ORAL | Status: DC
  Administered 2019-08-17 – 2019-08-18 (×2): 500 mg via ORAL
  Filled 2019-08-17 (×3): qty 1

## 2019-08-17 MED ORDER — PREDNISONE 20 MG PO TABS: 40.0000 mg | ORAL_TABLET | Freq: Every day | ORAL | Status: DC

## 2019-08-17 MED ORDER — PREDNISONE 20 MG PO TABS
40.0000 mg | ORAL_TABLET | Freq: Every day | ORAL | Status: DC
  Administered 2019-08-17: 21:00:00 40 mg via ORAL
  Filled 2019-08-17: qty 2

## 2019-08-17 NOTE — ED Notes (Signed)
Pt transported to floor

## 2019-08-17 NOTE — ED Provider Notes (Signed)
[facility] Emergency Department Provider Note       Time seen: ----------------------------------------- 3:28 PM on 08/17/2019 -----------------------------------------   I have reviewed the triage vital signs and the nursing notes.  HISTORY   Chief Complaint Epistaxis   HPI Yesenia Hart is a 79 y.o. female with a history of anemia, clotting disorder, diabetes, IBS, myelodysplasia who presents to the ED for epistaxis.  Patient states that started 2 hours ago, she is on Amicar.  She has used Neo-Synephrine nasal spray without any improvement.  Was recently admitted for rectal bleeding.  Describes 5 out of 10 discomfort.  Past Medical History:  Diagnosis Date  . Anemia   . B12 deficiency 06/10/2018  . Clotting disorder ([place])   . Diabetes mellitus without complication ([place])   . IBS (irritable bowel syndrome)   . Iron deficiency anemia due to chronic blood loss 12/12/2017  . MDS (myelodysplastic syndrome) ([place])   . MDS (myelodysplastic syndrome) ([place])   . Thyroid disease     Patient Active Problem List   Diagnosis Date Noted  . Symptomatic anemia   . GIB (gastrointestinal bleeding) 08/12/2019  . CKD (chronic kidney disease), stage III 08/12/2019  . Neutropenia ([place]) 12/24/2018  . Pancytopenia ([place]) 12/24/2018  . MDS (myelodysplastic syndrome), high grade ([place]) 11/18/2018  . Gastroesophageal reflux disease 07/19/2018  . B12 deficiency 06/10/2018  . 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 CAD (coronary artery disease), native coronary artery 03/15/2018  . Drug-induced neutropenia ([place]) 01/14/2018  . Type II diabetes mellitus with renal manifestations ([place]) 12/17/2017  . Fever blister 12/17/2017  . Tobacco use disorder, moderate, in sustained remission 12/17/2017  . Thrombocytopenia ([place]) 12/12/2017  . Goals of care, counseling/discussion 12/12/2017  . Other fatigue 12/12/2017  . Asthma 11/30/2017  . Cataract 11/30/2017   . 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 Edema 11/30/2017  . Essential hypertension 11/30/2017  . Hyperlipidemia associated with type 2 diabetes mellitus ([place]) 11/30/2017  . Osteoarthritis 11/30/2017  . 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 MDS (myelodysplastic syndrome) with 5q deletion ([place]) 11/30/2017    Past Surgical History:  Procedure Laterality Date  . APPENDECTOMY    . breast biopsy 20'    . CHOLECYSTECTOMY    . CORONARY ANGIOPLASTY WITH STENT PLACEMENT  2016   in [place]    Allergies Meperidine, Pioglitazone, and Acarbose  Social History Social History   Tobacco Use  . Smoking status: Former Smoker    Packs/day: 1.00    Types: Cigarettes    Quit date: 1999    Years since quitting: 22.1  . Smokeless tobacco: Never Used  Substance Use Topics  . Alcohol use: Not Currently  . Drug use: Never    Review of Systems Constitutional: Negative for fever. HEENT: Positive for epistaxis Cardiovascular: Negative for chest pain. Respiratory: Negative for shortness of breath. Gastrointestinal: Negative for abdominal pain, vomiting and diarrhea. Musculoskeletal: Negative for back pain. Skin: Negative for rash. Neurological: Negative for headaches, focal weakness or numbness.  All systems negative/normal/unremarkable except as stated in the HPI  ____________________________________________   PHYSICAL EXAM:  VITAL SIGNS: ED Triage Vitals  Enc Vitals Group     BP 08/17/19 1450 (!) 151/61     Pulse Rate 08/17/19 1450 94     Resp 08/17/19 1450 18     Temp 08/17/19 1450 98.2 F (36.8 C)     Temp src --      SpO2 08/17/19 1450 97 %  Weight 08/17/19 1451 172 lb 6.4 oz (78.2 kg)     Height 08/17/19 1451 5' 4"  (1.626 m)     Head Circumference --      Peak Flow --      Pain Score 08/17/19 1450 0     Pain Loc --      Pain Edu? --      Excl. in [place]? --     Constitutional: Alert and oriented.  Anxious, mild distress Eyes: Conjunctivae are normal. Normal  extraocular movements. ENT      Head: Normocephalic and atraumatic.      Nose: Bright red epistaxis coming from the right anteriorly      Mouth/Throat: Mucous membranes are moist.  No obvious active posterior bleeding      Neck: No stridor. Cardiovascular: Normal rate, regular rhythm. No murmurs, rubs, or gallops. Respiratory: Normal respiratory effort without tachypnea nor retractions. Breath sounds are clear and equal bilaterally. No wheezes/rales/rhonchi. Gastrointestinal: Soft and nontender. Normal bowel sounds Musculoskeletal: Nontender with normal range of motion in extremities. No lower extremity tenderness nor edema. Neurologic:  Normal speech and language. No gross focal neurologic deficits are appreciated.  Skin:  Skin is warm, dry and intact. No rash noted. Psychiatric: Mood and affect are normal. Speech and behavior are normal.  ____________________________________________  ED COURSE:  As part of my medical decision making, I reviewed the following data within the [place] History obtained from family if available, nursing notes, old chart and ekg, as well as notes from prior ED visits. Patient presented for epistaxis, we will assess with labs and imaging as indicated at this time.   Marland KitchenEpistaxis Management  Date/Time: 08/17/2019 3:47 PM Performed by: Earleen Newport, MD Authorized by: Earleen Newport, MD   Consent:    Consent obtained:  Verbal   Consent given by:  Patient Procedure details:    Treatment site:  R anterior   Treatment method:  Merocel sponge   Treatment complexity:  Limited   Treatment episode: initial   Post-procedure details:    Assessment:  Bleeding stopped   Patient tolerance of procedure:  Tolerated well, no immediate complications    Yesenia Hart was evaluated in Emergency Department on 08/17/2019 for the symptoms described in the history of present illness. She was evaluated in the context of the global COVID-19  pandemic, which necessitated consideration that the patient might be at risk for infection with the SARS-CoV-2 virus that causes COVID-19. Institutional protocols and algorithms that pertain to the evaluation of patients at risk for COVID-19 are in a state of rapid change based on information released by regulatory bodies including the CDC and federal and state organizations. These policies and algorithms were followed during the patient's care in the ED.  ____________________________________________   LABS (pertinent positives/negatives)  Labs Reviewed  CBC WITH DIFFERENTIAL/PLATELET - Abnormal; Notable for the following components:      Result Value   WBC 1.8 (*)    RBC 3.12 (*)    Hemoglobin 8.8 (*)    HCT 28.0 (*)    RDW 18.2 (*)    Platelets 10 (*)    nRBC 3.4 (*)    Neutro Abs 0.6 (*)    Abs Immature Granulocytes 0.11 (*)    All other components within normal limits  COMPREHENSIVE METABOLIC PANEL - Abnormal; Notable for the following components:   CO2 19 (*)    Glucose, Bld 301 (*)    BUN 36 (*)    Creatinine,  Ser 1.13 (*)    Calcium 8.8 (*)    Albumin 3.0 (*)    GFR calc non Af Amer 46 (*)    GFR calc Af Amer 53 (*)    All other components within normal limits  APTT - Abnormal; Notable for the following components:   aPTT <24 (*)    All other components within normal limits  PROTIME-INR  PATHOLOGIST SMEAR REVIEW  TYPE AND SCREEN  PREPARE PLATELET PHERESIS  ____________________________________________   CRITICAL CARE Performed by: Laurence Aly   Total critical care time: 30 minutes  Critical care time was exclusive of separately billable procedures and treating other patients.  Critical care was necessary to treat or prevent imminent or life-threatening deterioration.  Critical care was time spent personally by me on the following activities: development of treatment plan with patient and/or surrogate as well as nursing, discussions with consultants,  evaluation of patient's response to treatment, examination of patient, obtaining history from patient or surrogate, ordering and performing treatments and interventions, ordering and review of laboratory studies, ordering and review of radiographic studies, pulse oximetry and re-evaluation of patient's condition.   DIFFERENTIAL DIAGNOSIS   Epistaxis, anemia, thrombocytopenia  FINAL ASSESSMENT AND PLAN  Epistaxis, myelodysplasia   Plan: The patient had presented for epistaxis. Patient's labs did reveal a decrease in her platelets down to 10,000.  Epistaxis was controlled with a Merocel sponges dictated above.  She still having some oozing which I think is from thrombocytopenia.  I will order 1 pack of platelets and give IV tranexamic acid to try to stop the bleeding.  Currently her vital signs are stable.  I will discuss with medical oncology and the hospitalist service for admission.   Laurence Aly, MD    Note: This note was generated in part or whole with voice recognition software. Voice recognition is usually quite accurate but there are transcription errors that can and very often do occur. I apologize for any typographical errors that were not detected and corrected.     Earleen Newport, MD 08/17/19 [phone]

## 2019-08-17 NOTE — H&P (Signed)
History and Physical    Yesenia Hart GLO:[id number] DOB: October 16, 1940 DOA: 08/17/2019  PCP: Glean Hess, MD  Patient coming from: home  I have personally briefly reviewed patient's old medical records in [place]  Chief Complaint: persistent nose bleed  HPI: Yesenia Hart is a 79 y.o. 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  Pt reported nose bleed for 2.5 hours prior to presentation.  She had used Neo-Synephrine nasal spray without any improvement.  Had some blood in the wipes after BM this morning, but no bleeding per rectum since.  No other source of bleeding currently.  Pt has recurrent GI bleed and nose bleeds from her chronic severe thrombocytopenia.  Pt is otherwise at her baseline with no other complaints.  No fever, dyspnea, cough, chest pain, abdominal pain, N/V/D, dysuria, increased swelling, dizziness.   ED Course: initial vitals: afebrile, HR 94, BP 151/61, sating 97% on room air.  Labs notable for WBC 1.8, Hgb 8.8, plt 10.  ED provided inserted Merocel sponges, IV tranexamic acid.  Pt was ordered 1u plt in the ED before admission.   Assessment/Plan Active Problems:   Epistaxis  # Epistaxis --bled for 2.5 hours and didn't stop with home intervention.  Plt 10. PLAN: --check CBC after plt transfusion --Monitor for bleeding   DVT prophylaxis: None:active bleeding Code Status: DNR  Family Communication: not today  Disposition Plan: Home after bleeding stops   Consults called: none Admission status: Inpatient   Review of Systems: As per HPI otherwise 10 point review of systems negative.   Past Medical History:  Diagnosis Date  . Anemia   . B12 deficiency 06/10/2018  . Clotting disorder ([place])   . Diabetes mellitus  without complication ([place])   . IBS (irritable bowel syndrome)   . Iron deficiency anemia due to chronic blood loss 12/12/2017  . MDS (myelodysplastic syndrome) ([place])   . MDS (myelodysplastic syndrome) ([place])   . Thyroid disease     Past Surgical History:  Procedure Laterality Date  . APPENDECTOMY    . breast biopsy 92'    . CHOLECYSTECTOMY    . CORONARY ANGIOPLASTY WITH STENT PLACEMENT  2016   in [place]     reports that she quit smoking about 22 years ago. Her smoking use included cigarettes. She smoked 1.00 pack per day. She has never used smokeless tobacco. She reports previous alcohol use. She reports that she does not use drugs.  Allergies  Allergen Reactions  . Meperidine Nausea And Vomiting  . Pioglitazone Swelling    Other reaction(s): Edema  . Acarbose Diarrhea    Family History  Adopted: Yes  Family history unknown: Yes    Prior to Admission medications   Medication Sig Start Date End Date Taking? Authorizing Provider  aminocaproic acid (AMICAR) 500 MG tablet Take 1 tablet (500 mg total) by mouth every 6 (six) hours. 07/25/19  Yes Earlie Server, MD  atorvastatin (LIPITOR) 20 MG tablet TAKE 1 TABLET BY MOUTH ONCE DAILY Patient taking differently: Take 20 mg by mouth at bedtime.  05/22/19  Yes Glean Hess, MD  ciprofloxacin (CIPRO) 500 MG tablet Take 1 tablet (500 mg total) by mouth 2 (two) times daily. 07/31/19  Yes Earlie Server, MD  Cyanocobalamin (GNP VITAMIN B-12) 1000 MCG TBCR Take 1 tablet (1,000 mcg total) by mouth daily. 07/31/19  Yes Earlie Server, MD  fluconazole (DIFLUCAN) 200 MG tablet Take 200 mg by mouth daily. 08/06/19  Yes [provider]  furosemide (LASIX) 20 MG tablet TAKE 1 TABLET BY MOUTH ONCE DAILY Patient taking differently: Take 20 mg by mouth daily.  06/29/19  Yes Glean Hess, MD  glimepiride (AMARYL) 4 MG tablet TAKE 1 TABLET BY MOUTH ONCE DAILY Patient taking differently: Take 4 mg by mouth daily.  05/28/19  Yes Glean Hess, MD    levothyroxine (SYNTHROID) 50 MCG tablet TAKE 1 TABLET BY MOUTH ONCE DAILY ON AN EMPTY STOMACH. WAIT 30 MINUTES BEFORE TAKING OTHER MEDS. Patient taking differently: Take 50 mcg by mouth daily before breakfast.  04/22/19  Yes Glean Hess, MD  Multiple Vitamin (MULTIVITAMIN WITH MINERALS) TABS tablet Take 1 tablet by mouth daily. 08/16/19 09/15/19 Yes Val Riles, MD  NOVOLOG MIX 70/30 FLEXPEN (70-30) 100 UNIT/ML FlexPen Inject 0.08 mLs (8 Units total) into the skin 2 (two) times daily with a meal. 12/26/18  Yes Glean Hess, MD  nystatin (MYCOSTATIN/NYSTOP) powder Apply topically 2 (two) times daily as needed. Patient taking differently: Apply 1 application topically 2 (two) times daily as needed (rash).  07/31/19  Yes Earlie Server, MD  pantoprazole (PROTONIX) 40 MG tablet TAKE 1 TABLET BY MOUTH ONCE DAILY Patient taking differently: Take 40 mg by mouth daily.  07/14/19  Yes Glean Hess, MD  predniSONE (DELTASONE) 20 MG tablet Take 2 tablets (40 mg total) by mouth daily with breakfast for 5 days. 08/15/19 08/20/19 Yes Val Riles, MD  quinapril (ACCUPRIL) 20 MG tablet TAKE 1 TABLET BY MOUTH ONCE DAILY Patient taking differently: Take 20 mg by mouth daily.  06/04/19  Yes Glean Hess, MD  sulfaSALAzine (AZULFIDINE) 500 MG tablet TAKE 1 TABLET BY MOUTH AT BEDTIME Patient taking differently: Take 500 mg by mouth at bedtime.  05/16/19  Yes Glean Hess, MD  valACYclovir (VALTREX) 500 MG tablet Take 1 tablet (500 mg total) by mouth 2 (two) times daily. 07/31/19  Yes Earlie Server, MD  Insulin Pen Needle (ULTICARE MICRO PEN NEEDLES) 32G X 4 MM MISC Inject 1 each into the skin 2 (two) times daily. 08/05/19   Glean Hess, MD  Lancets [facility] ULTRASOFT) lancets USE AS DIRECTED. 06/29/19   Glean Hess, MD  loperamide (IMODIUM) 2 MG capsule Take 2 mg by mouth as needed for diarrhea or loose stools.    [provider]  ondansetron (ZOFRAN) 8 MG tablet TAKE 1 TABLET BY MOUTH TWICE  DAILY AS NEEDED NAUSEA AND VOMITING Patient taking differently: Take 8 mg by mouth every 8 (eight) hours as needed for nausea or vomiting.  07/25/18   Earlie Server, MD  [facility] ULTRA test strip TEST TWICE DAILY 04/19/19   Glean Hess, MD    Physical Exam: Vitals:   08/17/19 2024 08/17/19 2154 08/18/19 0456 08/18/19 0500  BP: (!) 154/58 (!) 170/55 (!) 171/73 (!) 153/75  Pulse: 85 95 96 (!) 101  Resp: 18 20 14    Temp: 98 F (36.7 C) 97.7 F (36.5 C) 98.4 F (36.9 C)   TempSrc: Oral Oral Oral   SpO2: 100% 99% 99%   Weight:      Height:        Constitutional: NAD, AAOx3 HEENT: conjunctivae and lids normal, EOMI CV: RRR no M,R,G. Distal pulses +2.  No cyanosis.   RESP: CTA B/L, normal respiratory effort  GI: +BS, NTND Extremities: No effusions, edema, or tenderness in  BLE MSK: normal ROM and strength, no joint enlargement or tenderness of both UE and LE SKIN: warm, dry and intact Neuro: II - XII grossly intact.  Sensation intact Psych: Normal mood and affect.  Appropriate judgement and reason   Labs on Admission: I have personally reviewed following labs and imaging studies  CBC: Recent Labs  Lab 08/14/19 0011 08/14/19 0612 08/15/19 0556 08/17/19 1502 08/18/19 0039  WBC 1.5* 1.4* 1.5* 1.8* 1.2*  NEUTROABS  --   --  0.3* 0.6*  --   HGB 7.7* 7.4* 8.5* 8.8* 8.2*  HCT 25.0* 24.4* 27.1* 28.0* 26.4*  MCV 90.3 91.0 89.4 89.7 90.4  PLT 6* 15* 17* 10* 23*   Basic Metabolic Panel: Recent Labs  Lab 08/13/19 0629 08/14/19 0612 08/15/19 0556 08/17/19 1502 08/18/19 0039  NA 141 137 139 136 139  K 4.1 4.0 4.4 4.1 4.3  CL 109 108 108 106 108  CO2 23 22 24  19* 20*  GLUCOSE 100* 169* 157* 301* 174*  BUN 20 19 17  36* 39*  CREATININE 0.85 0.94 0.98 1.13* 1.00  CALCIUM 8.1* 8.1* 8.4* 8.8* 8.7*  MG  --  1.8  --   --  2.2  PHOS  --  3.5  --   --   --    GFR: Estimated Creatinine Clearance: 45.4 mL/min (by C-G formula based on SCr of 1 mg/dL). Liver Function Tests: Recent  Labs  Lab 08/12/19 0914 08/17/19 1502  AST 21 25  ALT 16 19  ALKPHOS 89 86  BILITOT 1.2 0.8  PROT 6.6 6.6  ALBUMIN 3.2* 3.0*   No results for input(s): LIPASE, AMYLASE in the last 168 hours. No results for input(s): AMMONIA in the last 168 hours. Coagulation Profile: Recent Labs  Lab 08/12/19 0914 08/17/19 1502  INR 1.2 1.0   Cardiac Enzymes: No results for input(s): CKTOTAL, CKMB, CKMBINDEX, TROPONINI in the last 168 hours. BNP (last 3 results) No results for input(s): PROBNP in the last 8760 hours. HbA1C: No results for input(s): HGBA1C in the last 72 hours. CBG: Recent Labs  Lab 08/14/19 2329 08/15/19 0348 08/15/19 0751 08/15/19 1150 08/15/19 1534  GLUCAP 234* 156* 145* 183* 188*   Lipid Profile: No results for input(s): CHOL, HDL, LDLCALC, TRIG, CHOLHDL, LDLDIRECT in the last 72 hours. Thyroid Function Tests: No results for input(s): TSH, T4TOTAL, FREET4, T3FREE, THYROIDAB in the last 72 hours. Anemia Panel: No results for input(s): VITAMINB12, FOLATE, FERRITIN, TIBC, IRON, RETICCTPCT in the last 72 hours. Urine analysis:    Component Value Date/Time   COLORURINE YELLOW (A) 03/13/2018 1024   APPEARANCEUR CLEAR (A) 03/13/2018 1024   LABSPEC 1.006 03/13/2018 1024   PHURINE 6.0 03/13/2018 1024   GLUCOSEU NEGATIVE 03/13/2018 1024   HGBUR NEGATIVE 03/13/2018 1024   BILIRUBINUR NEGATIVE 03/13/2018 1024   KETONESUR NEGATIVE 03/13/2018 1024   PROTEINUR NEGATIVE 03/13/2018 1024   NITRITE NEGATIVE 03/13/2018 1024   LEUKOCYTESUR NEGATIVE 03/13/2018 1024    Radiological Exams on Admission: No results found.    Enzo Bi MD [facility]  If 7PM-7AM, please contact night-coverage 08/18/2019, 6:19 AM

## 2019-08-17 NOTE — ED Notes (Signed)
Report to inpatient RN. Transport requested.

## 2019-08-17 NOTE — ED Triage Notes (Signed)
First RN Note: Pt presents to ED via POV with c/o epistaxis x 2 hrs 15 mins, pt currently a hematology patient, last hgb 8.5 on 2/5. Pt's family member states she's on Ambicar, clamp applied PTA, pt's family member reports attempting nasal spray with epi PTA as well.

## 2019-08-17 NOTE — ED Triage Notes (Signed)
Pt arrives via POV from home with c/o nose bleed. Pt states this started about 2 hours ago. Pt is a hematology patient. Pt states she is on Ambicar. Clamp is in place.   Family also reports they used the nasal spray.  Pt denies any pain at this time. Pt states hx of this just not this bad.

## 2019-08-18 ENCOUNTER — Inpatient Hospital Stay: Payer: Medicare Other

## 2019-08-18 ENCOUNTER — Inpatient Hospital Stay: Payer: Medicare Other | Admitting: Oncology

## 2019-08-18 ENCOUNTER — Encounter: Payer: Self-pay | Admitting: Oncology

## 2019-08-18 DIAGNOSIS — R040 Epistaxis: Secondary | ICD-10-CM | POA: Diagnosis not present

## 2019-08-18 LAB — BPAM PLATELET PHERESIS
Blood Product Expiration Date: 202102102359
ISSUE DATE / TIME: 202102072001
Unit Type and Rh: 6200

## 2019-08-18 LAB — BASIC METABOLIC PANEL
Anion gap: 11 (ref 5–15)
BUN: 39 mg/dL — ABNORMAL HIGH (ref 8–23)
CO2: 20 mmol/L — ABNORMAL LOW (ref 22–32)
Calcium: 8.7 mg/dL — ABNORMAL LOW (ref 8.9–10.3)
Chloride: 108 mmol/L (ref 98–111)
Creatinine, Ser: 1 mg/dL (ref 0.44–1.00)
GFR calc Af Amer: >60 mL/min (ref >60)
GFR calc non Af Amer: 53 mL/min — ABNORMAL LOW (ref >60)
Glucose, Bld: 174 mg/dL — ABNORMAL HIGH (ref 70–99)
Potassium: 4.3 mmol/L (ref 3.5–5.1)
Sodium: 139 mmol/L (ref 135–145)

## 2019-08-18 LAB — PREPARE PLATELET PHERESIS
Unit division: 0
Unit tag comment: NEGATIVE

## 2019-08-18 LAB — PATHOLOGIST SMEAR REVIEW

## 2019-08-18 LAB — CBC
HCT: 26.4 % — ABNORMAL LOW (ref 36.0–46.0)
Hemoglobin: 8.2 g/dL — ABNORMAL LOW (ref 12.0–15.0)
MCH: 28.1 pg (ref 26.0–34.0)
MCHC: 31.1 g/dL (ref 30.0–36.0)
MCV: 90.4 fL (ref 80.0–100.0)
Platelets: 23 10*3/uL — CL (ref 150–400)
RBC: 2.92 MIL/uL — ABNORMAL LOW (ref 3.87–5.11)
RDW: 18.1 % — ABNORMAL HIGH (ref 11.5–15.5)
WBC: 1.2 10*3/uL — CL (ref 4.0–10.5)
nRBC: 4.2 % — ABNORMAL HIGH (ref 0.0–0.2)

## 2019-08-18 LAB — MAGNESIUM: Magnesium: 2.2 mg/dL (ref 1.7–2.4)

## 2019-08-18 LAB — GLUCOSE, CAPILLARY: Glucose-Capillary: 392 mg/dL — ABNORMAL HIGH (ref 70–99)

## 2019-08-18 LAB — IMMATURE PLATELET FRACTION: Immature Platelet Fraction: 4.6 % (ref 1.2–8.6)

## 2019-08-18 MED ORDER — NYSTATIN 100000 UNIT/GM EX POWD: 1.0000 "application " | Freq: Two times a day (BID) | CUTANEOUS | Status: AC | PRN

## 2019-08-18 MED ORDER — PANTOPRAZOLE SODIUM 40 MG PO TBEC: 40.0000 mg | DELAYED_RELEASE_TABLET | Freq: Every day | ORAL | Status: AC

## 2019-08-18 MED ORDER — FUROSEMIDE 20 MG PO TABS: 20.0000 mg | ORAL_TABLET | Freq: Every day | ORAL | Status: AC

## 2019-08-18 MED ORDER — SULFASALAZINE 500 MG PO TABS: 500.0000 mg | ORAL_TABLET | Freq: Every day | ORAL | Status: AC

## 2019-08-18 MED ORDER — INSULIN ASPART 100 UNIT/ML ~~LOC~~ SOLN
0.0000 [IU] | Freq: Three times a day (TID) | SUBCUTANEOUS | Status: DC
  Administered 2019-08-18: 13:00:00 9 [IU] via SUBCUTANEOUS
  Filled 2019-08-18: qty 1

## 2019-08-18 MED ORDER — LEVOTHYROXINE SODIUM 50 MCG PO TABS: 50.0000 ug | ORAL_TABLET | Freq: Every day | ORAL | Status: AC

## 2019-08-18 MED ORDER — GLIMEPIRIDE 4 MG PO TABS: 4.0000 mg | ORAL_TABLET | Freq: Every day | ORAL | Status: AC

## 2019-08-18 MED ORDER — QUINAPRIL HCL 20 MG PO TABS: 20.0000 mg | ORAL_TABLET | Freq: Every day | ORAL | Status: DC

## 2019-08-18 MED ORDER — CIPROFLOXACIN HCL 500 MG PO TABS
500.0000 mg | ORAL_TABLET | Freq: Two times a day (BID) | ORAL | Status: DC
  Administered 2019-08-18: 08:00:00 500 mg via ORAL
  Filled 2019-08-18: qty 1

## 2019-08-18 MED ORDER — ONDANSETRON HCL 8 MG PO TABS: 8.0000 mg | ORAL_TABLET | Freq: Three times a day (TID) | ORAL | Status: AC | PRN

## 2019-08-18 NOTE — Care Management Obs Status (Signed)
[place] NOTIFICATION   Patient Details  Name: Yesenia Hart MRN: [id number] Date of Birth: 1941/01/09   Medicare Observation Status Notification Given:  Yes    Beverly Sessions, RN 08/18/2019, 2:55 PM

## 2019-08-18 NOTE — TOC Initial Note (Signed)
Transition of Care [facility]) - Initial/Assessment Note    Patient Details  Name: Yesenia Hart MRN: [id number] Date of Birth: December 31, 1940  [facility] CM/SW Contact:    Beverly Sessions, RN Phone Number: 08/18/2019, 3:33 PM  Clinical Narrative:                  Patient to discharge home today Patient states that she lives at home with her husband  PCP [place] drug - denies issues obtaining medications Daughter provides transportation  Patient states that she has a rollator in the home  [place] with [place] notified of discharge  Expected Discharge Plan: [place] Barriers to Discharge: No Barriers Identified   Patient Goals and CMS Choice     Choice offered to / list presented to : Patient  Expected Discharge Plan and Services Expected Discharge Plan: [place] arrangements for the past 2 months: Apartment Expected Discharge Date: 08/18/19                         HH Arranged: PT [place] Agency: [place] ([place]) Date HH Agency Contacted: 08/18/19   Representative spoke with at [place]: Corene Cornea  Prior Living Arrangements/Services Living arrangements for the past 2 months: Apartment Lives with:: Spouse Patient language and need for interpreter reviewed:: Yes Do you feel safe going back to the place where you live?: Yes      Need for Family Participation in Patient Care: Yes (Comment) Care giver support system in place?: Yes (comment) Current home services: DME Criminal Activity/Legal Involvement Pertinent to Current Situation/Hospitalization: No - Comment as needed  Activities of Daily Living Home Assistive Devices/Equipment: Environmental consultant (specify type) ADL Screening (condition at time of admission) Patient's cognitive ability adequate to safely complete daily activities?: Yes Is the patient deaf or have difficulty hearing?:  No Does the patient have difficulty seeing, even when wearing glasses/contacts?: No Does the patient have difficulty concentrating, remembering, or making decisions?: No Patient able to express need for assistance with ADLs?: Yes Does the patient have difficulty dressing or bathing?: No Independently performs ADLs?: Yes (appropriate for developmental age) Does the patient have difficulty walking or climbing stairs?: No Weakness of Legs: None Weakness of Arms/Hands: Left  Permission Sought/Granted                  Emotional Assessment Appearance:: Appears stated age     Orientation: : Oriented to Self, Oriented to Place, Oriented to  Time, Oriented to Situation   Psych Involvement: No (comment)  Admission diagnosis:  Epistaxis [R04.0] Thrombocytopenia (HCC) [D69.6] MDS (myelodysplastic syndrome) ([place]) [D46.9] Patient Active Problem List   Diagnosis Date Noted  . Epistaxis 08/17/2019  . Symptomatic anemia   . GIB (gastrointestinal bleeding) 08/12/2019  . CKD (chronic kidney disease), stage III 08/12/2019  . Neutropenia ([place]) 12/24/2018  . Pancytopenia ([place]) 12/24/2018  . MDS (myelodysplastic syndrome), high grade ([place]) 11/18/2018  . Gastroesophageal reflux disease 07/19/2018  . B12 deficiency 06/10/2018  . 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 CAD (coronary artery disease), native coronary artery 03/15/2018  . Drug-induced neutropenia ([place]) 01/14/2018  . Type II diabetes mellitus with renal manifestations ([place]) 12/17/2017  . Fever blister 12/17/2017  . Tobacco use disorder, moderate, in sustained remission 12/17/2017  . Thrombocytopenia ([place]) 12/12/2017  . Goals of care, counseling/discussion 12/12/2017  .  Other fatigue 12/12/2017  . Asthma 11/30/2017  . Cataract 11/30/2017  . 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 Edema 11/30/2017  . Essential hypertension 11/30/2017  . Hyperlipidemia associated with type 2 diabetes mellitus ([place])  11/30/2017  . Osteoarthritis 11/30/2017  . 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 MDS (myelodysplastic syndrome) with 5q deletion ([place]) 11/30/2017   PCP:  Glean Hess, MD Pharmacy:   [place], [place]  [id number] Phone: [phone] Fax: [phone]     Social Determinants of Health (SDOH) Interventions    Readmission Risk Interventions No flowsheet data found.

## 2019-08-18 NOTE — Progress Notes (Signed)
Discharge instructions reviewed with the patient. IV had started bleeding. Bleeding stopped. Patient sent out via wheelchair to her daughters waiting car

## 2019-08-18 NOTE — Care Management CC44 (Signed)
Condition Code 44 Documentation Completed  Patient Details  Name: Yesenia Hart MRN: [id number] Date of Birth: September 25, 1940   Condition Code 44 given:  Yes Patient signature on Condition Code 44 notice:  Yes Documentation of 2 MD's agreement:  Yes Code 44 added to claim:  Yes    Beverly Sessions, RN 08/18/2019, 2:55 PM

## 2019-08-18 NOTE — Discharge Summary (Signed)
Physician Discharge Summary   Yesenia Hart  female DOB: [birth year]  IRC:[id number]  PCP: Glean Hess, MD  Admit date: 08/17/2019 Discharge date: 08/18/2019  Admitted From: home Disposition:  home Home Health: Yes CODE STATUS: DNR  Discharge Instructions    Diet - low sodium heart healthy   Complete by: As directed    Increase activity slowly   Complete by: As directed        Hospital Course:  For full details, please see H&P, progress notes, consult notes and ancillary notes.  Briefly,  Yesenia Hart is a 79 y.o. 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  In the ED, initial vitals: afebrile, HR 94, BP 151/61, sating 97% on room air.  Labs notable for WBC 1.8, Hgb 8.8, plt 10.  ED provided inserted Merocel sponges, IV tranexamic acid.  Pt was ordered 1u plt in the ED before admission.  # Epistaxis due to severe thrombocytopenia  Bled for 2.5 hours and didn't stop with home intervention.  Plt 10.  Pt received 1u plt with rise to 23.  Hgb stable in the 8's.  Pt's nose bleed appeared to have stopped the next day, so Merocel sponge was removed by nursing, and pt discharged with close followup with her hematologist.  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  Pt was continued on home Amicar, and ppx cipro, Diflucan, and Valtrex.    # Hx of ulcerative colitis Continued home sulfasalazine  # Left wrist swelling,POA Continued home prednisone for a total of 5 days.  #Type II diabetes mellitus with renal manifestations: Most recent A1c6.4 on 04/21/19, controled. Resume home regimen at discharge.  #Hypothyroidism due to acquired atrophy of thyroid: Continued Synthroid  # CAD (coronary artery disease), native coronary  artery: s/p stent,   # Gastroesophageal reflux disease:-on protonix  # CKD (chronic kidney disease), stage IIIa:stable   Discharge Diagnoses:  Active Problems:   Epistaxis    Discharge Instructions:  Allergies as of 08/18/2019      Reactions   Meperidine Nausea And Vomiting   Pioglitazone Swelling   Other reaction(s): Edema   Acarbose Diarrhea      Medication List    STOP taking these medications   quinapril 20 MG tablet Commonly known as: ACCUPRIL     TAKE these medications   aminocaproic acid 500 MG tablet Commonly known as: Amicar Take 1 tablet (500 mg total) by mouth every 6 (six) hours.   atorvastatin 20 MG tablet Commonly known as: LIPITOR TAKE 1 TABLET BY MOUTH ONCE DAILY What changed: when to take this   ciprofloxacin 500 MG tablet Commonly known as: Cipro Take 1 tablet (500 mg total) by mouth 2 (two) times daily.   fluconazole 200 MG tablet Commonly known as: DIFLUCAN Take 200 mg by mouth daily.   furosemide 20 MG tablet Commonly known as: LASIX Take 1 tablet (20 mg total) by mouth daily.   glimepiride 4 MG tablet Commonly known as: AMARYL Take 1 tablet (4 mg total) by mouth daily.   GNP Vitamin B-12 1000 MCG Tbcr Generic drug: Cyanocobalamin Take 1 tablet (1,000 mcg total) by mouth daily.   levothyroxine 50 MCG tablet Commonly known as: SYNTHROID Take 1 tablet (50 mcg total) by mouth daily before breakfast. What changed: See the new instructions.   loperamide 2 MG  capsule Commonly known as: IMODIUM Take 2 mg by mouth as needed for diarrhea or loose stools.   multivitamin with minerals Tabs tablet Take 1 tablet by mouth daily.   NovoLOG Mix 70/30 FlexPen (70-30) 100 UNIT/ML FlexPen Generic drug: insulin aspart protamine - aspart Inject 0.08 mLs (8 Units total) into the skin 2 (two) times daily with a meal.   nystatin powder Commonly known as: MYCOSTATIN/NYSTOP Apply 1 application topically 2 (two) times daily as needed (rash).     ondansetron 8 MG tablet Commonly known as: ZOFRAN Take 1 tablet (8 mg total) by mouth every 8 (eight) hours as needed for nausea or vomiting. What changed: See the new instructions.   OneTouch Ultra test strip Generic drug: glucose blood TEST TWICE DAILY   onetouch ultrasoft lancets USE AS DIRECTED.   pantoprazole 40 MG tablet Commonly known as: PROTONIX Take 1 tablet (40 mg total) by mouth daily.   sulfaSALAzine 500 MG tablet Commonly known as: AZULFIDINE Take 1 tablet (500 mg total) by mouth at bedtime.   UltiCare Micro Pen Needles 32G X 4 MM Misc Generic drug: Insulin Pen Needle Inject 1 each into the skin 2 (two) times daily.   valACYclovir 500 MG tablet Commonly known as: VALTREX Take 1 tablet (500 mg total) by mouth 2 (two) times daily.     ASK your doctor about these medications   predniSONE 20 MG tablet Commonly known as: DELTASONE Take 2 tablets (40 mg total) by mouth daily with breakfast for 5 days. Ask about: Should I take this medication?       Follow-up Information    Glean Hess, MD. Schedule an appointment as soon as possible for a visit in 1 week.   Specialty: Internal Medicine Why: will call patient with appointment, if you havent heard from them by 230 please call them back Contact information: [address] [place] [address] [phone]           Allergies  Allergen Reactions  . Meperidine Nausea And Vomiting  . Pioglitazone Swelling    Other reaction(s): Edema  . Acarbose Diarrhea     The results of significant diagnostics from this hospitalization (including imaging, microbiology, ancillary and laboratory) are listed below for reference.   Consultations:   Procedures/Studies: DG Wrist Complete Left  Result Date: 08/14/2019 CLINICAL DATA:  79 year old female with a history of wrist pain EXAM: LEFT WRIST - COMPLETE 3+ VIEW COMPARISON:  None. FINDINGS: Diffuse osteopenia. The scaphoid view demonstrates a  linear lucency through the waist of the scaphoid, potentially a nondisplaced fracture though limited by the osteopenia. Advanced degenerative changes at the first carpometacarpal joint. Chondral calcifications at the radiocarpal and ulnar carpal joints. The arcs of the wrist are maintained. Unremarkable appearance of the distal radius and ulna. IMPRESSION: There is a questionable nondisplaced fracture of the scaphoid. If the patient has a history of wrist injury and/or point tenderness at the snuffbox, further evaluation with MR may be considered. Diffuse osteopenia. Degenerative changes, including chondral calcifications. Electronically Signed   By: Corrie Mckusick D.O.   On: 08/14/2019 10:04   DG Wrist Complete Left  Result Date: 08/07/2019 CLINICAL DATA:  Pain, swelling left wrist.  No known injury. EXAM: LEFT WRIST - COMPLETE 3+ VIEW COMPARISON:  None. FINDINGS: Advanced osteoarthritis at the 1st carpometacarpal joint and radiocarpal joint. No acute bony abnormality. Specifically, no fracture, subluxation, or dislocation. IMPRESSION: Advanced osteoarthritis.  No acute bony abnormality. Electronically Signed   By: Rolm Baptise  M.D.   On: 08/07/2019 21:45   MR WRIST LEFT WO CONTRAST  Result Date: 08/15/2019 CLINICAL DATA:  Right wrist pain and swelling. Possible scaphoid fracture. Abnormal x-ray EXAM: MR OF THE LEFT WRIST WITHOUT CONTRAST TECHNIQUE: Multiplanar, multisequence MR imaging of the left wrist was performed. No intravenous contrast was administered. COMPARISON:  X-ray 08/14/2019 FINDINGS: Technical note: Motion degraded examination. Ligaments: Suboptimally evaluated. No evidence of high-grade tear involving the scapholunate or lunotriquetral ligaments. Osseous alignment is maintained. Triangular fibrocartilage: Appears mildly degenerated, grossly intact. Small amount of fluid with suggestion of synovitis within the DRUJ. Tendons: Flexor and extensor tendons appear grossly intact. No appreciable  tenosynovial fluid collections. Carpal tunnel/median nerve: No obvious abnormalities. Guyon's canal: Unremarkable. Joint/cartilage: Advanced degenerative changes throughout the wrist, most pronounced at the triscaphe and radiocarpal joints. Severe degenerative changes at the first carpometacarpal joint with chronic osseous fragmentation. Small effusions with synovitis in the proximal and mid carpal row. Bones/carpal alignment: Scattered subchondral cystic changes within the carpal bones most pronounced at the triquetrum and distal scaphoid pole, possibly representing small erosions. No acute fracture identified. Specifically no scaphoid fracture is seen. No dislocation. Other: Circumferential soft tissue edema. No drainable fluid collection. IMPRESSION: 1. No scaphoid fracture identified. 2. Advanced degenerative changes throughout the wrist, most pronounced at the first carpometacarpal joint. Small effusions with synovitis throughout the wrist and DRUJ. Findings raise suspicion of an underlying crystalline or inflammatory arthropathy including CPPD and RA. Although felt to be less likely, an infectious arthropathy would be difficult to exclude. If there is clinical suspicion for infection, arthrocentesis should be performed. 3. Nonspecific circumferential soft tissue edema. Electronically Signed   By: Davina Poke D.O.   On: 08/15/2019 13:26      Labs: BNP (last 3 results) No results for input(s): BNP in the last 8760 hours. Basic Metabolic Panel: Recent Labs  Lab 08/15/19 0556 08/17/19 1502 08/18/19 0039  NA 139 136 139  K 4.4 4.1 4.3  CL 108 106 108  CO2 24 19* 20*  GLUCOSE 157* 301* 174*  BUN 17 36* 39*  CREATININE 0.98 1.13* 1.00  CALCIUM 8.4* 8.8* 8.7*  MG  --   --  2.2   Liver Function Tests: Recent Labs  Lab 08/17/19 1502  AST 25  ALT 19  ALKPHOS 86  BILITOT 0.8  PROT 6.6  ALBUMIN 3.0*   No results for input(s): LIPASE, AMYLASE in the last 168 hours. No results for  input(s): AMMONIA in the last 168 hours. CBC: Recent Labs  Lab 08/15/19 0556 08/17/19 1502 08/18/19 0039 08/20/19 1316  WBC 1.5* 1.8* 1.2* 2.0*  NEUTROABS 0.3* 0.6*  --  0.7*  HGB 8.5* 8.8* 8.2* 8.6*  HCT 27.1* 28.0* 26.4* 28.5*  MCV 89.4 89.7 90.4 91.3  PLT 17* 10* 23* 10*   Cardiac Enzymes: No results for input(s): CKTOTAL, CKMB, CKMBINDEX, TROPONINI in the last 168 hours. BNP: Invalid input(s): POCBNP CBG: Recent Labs  Lab 08/15/19 0348 08/15/19 0751 08/15/19 1150 08/15/19 1534 08/18/19 1141  GLUCAP 156* 145* 183* 188* 392*   D-Dimer No results for input(s): DDIMER in the last 72 hours. Hgb A1c No results for input(s): HGBA1C in the last 72 hours. Lipid Profile No results for input(s): CHOL, HDL, LDLCALC, TRIG, CHOLHDL, LDLDIRECT in the last 72 hours. Thyroid function studies No results for input(s): TSH, T4TOTAL, T3FREE, THYROIDAB in the last 72 hours.  Invalid input(s): FREET3 Anemia work up Recent Labs    08/20/19 1315  FERRITIN 755*  TIBC 232*  IRON 128   Urinalysis    Component Value Date/Time   COLORURINE YELLOW (A) 03/13/2018 1024   APPEARANCEUR CLEAR (A) 03/13/2018 1024   LABSPEC 1.006 03/13/2018 1024   PHURINE 6.0 03/13/2018 1024   GLUCOSEU NEGATIVE 03/13/2018 1024   HGBUR NEGATIVE 03/13/2018 1024   BILIRUBINUR NEGATIVE 03/13/2018 1024   KETONESUR NEGATIVE 03/13/2018 1024   PROTEINUR NEGATIVE 03/13/2018 1024   NITRITE NEGATIVE 03/13/2018 1024   LEUKOCYTESUR NEGATIVE 03/13/2018 1024   Sepsis Labs Invalid input(s): PROCALCITONIN,  WBC,  LACTICIDVEN Microbiology Recent Results (from the past 240 hour(s))  Respiratory Panel by RT PCR (Flu A&B, Covid) - Nasopharyngeal Swab     Status: None   Collection Time: 08/12/19 10:51 AM   Specimen: Nasopharyngeal Swab  Result Value Ref Range Status   SARS Coronavirus 2 by RT PCR NEGATIVE NEGATIVE Final    Comment: (NOTE) SARS-CoV-2 target nucleic acids are NOT DETECTED. The SARS-CoV-2 RNA is  generally detectable in upper respiratoy specimens during the acute phase of infection. The lowest concentration of SARS-CoV-2 viral copies this assay can detect is 131 copies/mL. A negative result does not preclude SARS-Cov-2 infection and should not be used as the sole basis for treatment or other patient management decisions. A negative result may occur with  improper specimen collection/handling, submission of specimen other than nasopharyngeal swab, presence of viral mutation(s) within the areas targeted by this assay, and inadequate number of viral copies (<131 copies/mL). A negative result must be combined with clinical observations, patient history, and epidemiological information. The expected result is Negative. Fact Sheet for Patients:  PinkCheek.be Fact Sheet for Healthcare Providers:  GravelBags.it This test is not yet ap proved or cleared by the [place] FDA and  has been authorized for detection and/or diagnosis of SARS-CoV-2 by FDA under an Emergency Use Authorization (EUA). This EUA will remain  in effect (meaning this test can be used) for the duration of the COVID-19 declaration under Section 564(b)(1) of the Act, 21 U.S.C. section 360bbb-3(b)(1), unless the authorization is terminated or revoked sooner.    Influenza A by PCR NEGATIVE NEGATIVE Final   Influenza B by PCR NEGATIVE NEGATIVE Final    Comment: (NOTE) The Xpert Xpress SARS-CoV-2/FLU/RSV assay is intended as an aid in  the diagnosis of influenza from Nasopharyngeal swab specimens and  should not be used as a sole basis for treatment. Nasal washings and  aspirates are unacceptable for Xpert Xpress SARS-CoV-2/FLU/RSV  testing. Fact Sheet for Patients: PinkCheek.be Fact Sheet for Healthcare Providers: GravelBags.it This test is not yet approved or cleared by the [place] FDA and    has been authorized for detection and/or diagnosis of SARS-CoV-2 by  FDA under an Emergency Use Authorization (EUA). This EUA will remain  in effect (meaning this test can be used) for the duration of the  Covid-19 declaration under Section 564(b)(1) of the Act, 21  U.S.C. section 360bbb-3(b)(1), unless the authorization is  terminated or revoked. Performed at [facility], [address] [place], [place] [address]      Total time spend on discharging this patient, including the last patient exam, discussing the hospital stay, instructions for ongoing care as it relates to all pertinent caregivers, as well as preparing the medical discharge records, prescriptions, and/or referrals as applicable, is 30 minutes.    Enzo Bi, MD  [facility] 08/22/2019, 1:29 AM  If 7PM-7AM, please contact night-coverage

## 2019-08-19 ENCOUNTER — Other Ambulatory Visit: Payer: Self-pay | Admitting: Oncology

## 2019-08-19 DIAGNOSIS — D696 Thrombocytopenia, unspecified: Secondary | ICD-10-CM

## 2019-08-19 DIAGNOSIS — D46C Myelodysplastic syndrome with isolated del(5q) chromosomal abnormality: Secondary | ICD-10-CM

## 2019-08-20 ENCOUNTER — Other Ambulatory Visit: Payer: Self-pay

## 2019-08-20 ENCOUNTER — Telehealth: Payer: Self-pay

## 2019-08-20 ENCOUNTER — Other Ambulatory Visit: Payer: Self-pay | Admitting: Oncology

## 2019-08-20 ENCOUNTER — Inpatient Hospital Stay: Payer: Medicare Other | Attending: Oncology

## 2019-08-20 DIAGNOSIS — Z79899 Other long term (current) drug therapy: Secondary | ICD-10-CM | POA: Diagnosis not present

## 2019-08-20 DIAGNOSIS — E079 Disorder of thyroid, unspecified: Secondary | ICD-10-CM | POA: Diagnosis not present

## 2019-08-20 DIAGNOSIS — K5289 Other specified noninfective gastroenteritis and colitis: Secondary | ICD-10-CM | POA: Insufficient documentation

## 2019-08-20 DIAGNOSIS — R12 Heartburn: Secondary | ICD-10-CM | POA: Insufficient documentation

## 2019-08-20 DIAGNOSIS — R531 Weakness: Secondary | ICD-10-CM | POA: Diagnosis not present

## 2019-08-20 DIAGNOSIS — K760 Fatty (change of) liver, not elsewhere classified: Secondary | ICD-10-CM | POA: Diagnosis not present

## 2019-08-20 DIAGNOSIS — D696 Thrombocytopenia, unspecified: Secondary | ICD-10-CM | POA: Diagnosis not present

## 2019-08-20 DIAGNOSIS — M8580 Other specified disorders of bone density and structure, unspecified site: Secondary | ICD-10-CM | POA: Insufficient documentation

## 2019-08-20 DIAGNOSIS — R079 Chest pain, unspecified: Secondary | ICD-10-CM | POA: Diagnosis not present

## 2019-08-20 DIAGNOSIS — Z794 Long term (current) use of insulin: Secondary | ICD-10-CM | POA: Diagnosis not present

## 2019-08-20 DIAGNOSIS — D46C Myelodysplastic syndrome with isolated del(5q) chromosomal abnormality: Secondary | ICD-10-CM

## 2019-08-20 DIAGNOSIS — D709 Neutropenia, unspecified: Secondary | ICD-10-CM | POA: Insufficient documentation

## 2019-08-20 DIAGNOSIS — R110 Nausea: Secondary | ICD-10-CM | POA: Diagnosis not present

## 2019-08-20 DIAGNOSIS — Z87891 Personal history of nicotine dependence: Secondary | ICD-10-CM | POA: Insufficient documentation

## 2019-08-20 DIAGNOSIS — R42 Dizziness and giddiness: Secondary | ICD-10-CM | POA: Diagnosis not present

## 2019-08-20 DIAGNOSIS — E119 Type 2 diabetes mellitus without complications: Secondary | ICD-10-CM | POA: Insufficient documentation

## 2019-08-20 LAB — IRON AND TIBC
Iron: 128 ug/dL (ref 28–170)
Saturation Ratios: 55 % — ABNORMAL HIGH (ref 10.4–31.8)
TIBC: 232 ug/dL — ABNORMAL LOW (ref 250–450)
UIBC: 104 ug/dL

## 2019-08-20 LAB — TYPE AND SCREEN
ABO/RH(D): B POS
Antibody Screen: NEGATIVE

## 2019-08-20 LAB — CBC WITH DIFFERENTIAL/PLATELET
Abs Immature Granulocytes: 0.18 10*3/uL — ABNORMAL HIGH (ref 0.00–0.07)
Basophils Absolute: 0 10*3/uL (ref 0.0–0.1)
Basophils Relative: 0 %
Eosinophils Absolute: 0 10*3/uL (ref 0.0–0.5)
Eosinophils Relative: 0 %
HCT: 28.5 % — ABNORMAL LOW (ref 36.0–46.0)
Hemoglobin: 8.6 g/dL — ABNORMAL LOW (ref 12.0–15.0)
Immature Granulocytes: 9 %
Lymphocytes Relative: 37 %
Lymphs Abs: 0.7 10*3/uL (ref 0.7–4.0)
MCH: 27.6 pg (ref 26.0–34.0)
MCHC: 30.2 g/dL (ref 30.0–36.0)
MCV: 91.3 fL (ref 80.0–100.0)
Monocytes Absolute: 0.4 10*3/uL (ref 0.1–1.0)
Monocytes Relative: 21 %
Neutro Abs: 0.7 10*3/uL — ABNORMAL LOW (ref 1.7–7.7)
Neutrophils Relative %: 33 %
Platelets: 10 10*3/uL — CL (ref 150–400)
RBC: 3.12 MIL/uL — ABNORMAL LOW (ref 3.87–5.11)
RDW: 18.5 % — ABNORMAL HIGH (ref 11.5–15.5)
Smear Review: NORMAL
WBC: 2 10*3/uL — ABNORMAL LOW (ref 4.0–10.5)
nRBC: 3 % — ABNORMAL HIGH (ref 0.0–0.2)

## 2019-08-20 LAB — FERRITIN: Ferritin: 755 ng/mL — ABNORMAL HIGH (ref 11–307)

## 2019-08-20 NOTE — Telephone Encounter (Signed)
[facility] Physical Therapist called saying he needs verbal approval to see patient for PT 2 x a week for 4 weeks.  Called and gave verbal approval per Dr Army Melia.  CB# [phone]  CM

## 2019-08-21 ENCOUNTER — Inpatient Hospital Stay (HOSPITAL_BASED_OUTPATIENT_CLINIC_OR_DEPARTMENT_OTHER): Payer: Medicare Other | Admitting: Oncology

## 2019-08-21 ENCOUNTER — Other Ambulatory Visit: Payer: Medicare Other

## 2019-08-21 ENCOUNTER — Encounter: Payer: Self-pay | Admitting: Oncology

## 2019-08-21 ENCOUNTER — Other Ambulatory Visit: Payer: Self-pay

## 2019-08-21 ENCOUNTER — Inpatient Hospital Stay (HOSPITAL_BASED_OUTPATIENT_CLINIC_OR_DEPARTMENT_OTHER): Payer: Medicare Other | Admitting: Hospice and Palliative Medicine

## 2019-08-21 ENCOUNTER — Telehealth: Payer: Self-pay

## 2019-08-21 ENCOUNTER — Inpatient Hospital Stay: Payer: Medicare Other

## 2019-08-21 VITALS — BP 122/46 | HR 67 | Temp 95.4°F | Resp 17

## 2019-08-21 VITALS — BP 86/53 | HR 76 | Temp 97.0°F | Resp 20

## 2019-08-21 DIAGNOSIS — Z7189 Other specified counseling: Secondary | ICD-10-CM

## 2019-08-21 DIAGNOSIS — R110 Nausea: Secondary | ICD-10-CM

## 2019-08-21 DIAGNOSIS — D708 Other neutropenia: Secondary | ICD-10-CM

## 2019-08-21 DIAGNOSIS — G893 Neoplasm related pain (acute) (chronic): Secondary | ICD-10-CM | POA: Diagnosis not present

## 2019-08-21 DIAGNOSIS — D696 Thrombocytopenia, unspecified: Secondary | ICD-10-CM

## 2019-08-21 DIAGNOSIS — D709 Neutropenia, unspecified: Secondary | ICD-10-CM | POA: Diagnosis not present

## 2019-08-21 DIAGNOSIS — K5289 Other specified noninfective gastroenteritis and colitis: Secondary | ICD-10-CM | POA: Diagnosis not present

## 2019-08-21 DIAGNOSIS — D46C Myelodysplastic syndrome with isolated del(5q) chromosomal abnormality: Secondary | ICD-10-CM | POA: Diagnosis not present

## 2019-08-21 DIAGNOSIS — Z515 Encounter for palliative care: Secondary | ICD-10-CM | POA: Diagnosis not present

## 2019-08-21 DIAGNOSIS — D6189 Other specified aplastic anemias and other bone marrow failure syndromes: Secondary | ICD-10-CM

## 2019-08-21 DIAGNOSIS — E079 Disorder of thyroid, unspecified: Secondary | ICD-10-CM | POA: Diagnosis not present

## 2019-08-21 DIAGNOSIS — K760 Fatty (change of) liver, not elsewhere classified: Secondary | ICD-10-CM | POA: Diagnosis not present

## 2019-08-21 DIAGNOSIS — E538 Deficiency of other specified B group vitamins: Secondary | ICD-10-CM

## 2019-08-21 MED ORDER — ACETAMINOPHEN 325 MG PO TABS
650.0000 mg | ORAL_TABLET | Freq: Once | ORAL | Status: AC
  Administered 2019-08-21: 650 mg via ORAL
  Filled 2019-08-21: qty 2

## 2019-08-21 MED ORDER — PANTOPRAZOLE SODIUM 40 MG IV SOLR
40.0000 mg | Freq: Once | INTRAVENOUS | Status: AC
  Administered 2019-08-21: 40 mg via INTRAVENOUS
  Filled 2019-08-21: qty 40

## 2019-08-21 MED ORDER — SODIUM CHLORIDE 0.9% IV SOLUTION
250.0000 mL | Freq: Once | INTRAVENOUS | Status: AC
  Administered 2019-08-21: 250 mL via INTRAVENOUS
  Filled 2019-08-21: qty 250

## 2019-08-21 MED ORDER — ONDANSETRON HCL 4 MG/2ML IJ SOLN
8.0000 mg | Freq: Once | INTRAMUSCULAR | Status: AC
  Administered 2019-08-21: 8 mg via INTRAVENOUS
  Filled 2019-08-21: qty 4

## 2019-08-21 MED ORDER — DIPHENHYDRAMINE HCL 25 MG PO CAPS
25.0000 mg | ORAL_CAPSULE | Freq: Once | ORAL | Status: AC
  Administered 2019-08-21: 25 mg via ORAL
  Filled 2019-08-21: qty 1

## 2019-08-21 MED ORDER — ROMIPLOSTIM INJECTION 500 MCG
500.0000 ug | Freq: Once | SUBCUTANEOUS | Status: AC
  Administered 2019-08-21: 500 ug via SUBCUTANEOUS
  Filled 2019-08-21: qty 1

## 2019-08-21 NOTE — Telephone Encounter (Signed)
Pt daughter Ms Luana Shu called to cancel pts appt next week. She is urgently moving pt into hospice today due to her severe decline in health. Daughter said she does not believe she will be living much longer with in the week.   Canceled appt for pt.  CM

## 2019-08-21 NOTE — Progress Notes (Signed)
Patient is accompanied by her daughter today.  She is complaining of new nausea and mid chest pain that started 10 minutes ago.

## 2019-08-21 NOTE — Progress Notes (Signed)
[place]  [phone] [phone]   Name: Yesenia Hart Date: 08/21/2019 MRN: [id number]  DOB: [birth year]  Patient Care Team: Glean Hess, MD as PCP - General (Internal Medicine) Earlie Server, MD as Consulting Physician (Oncology)    REASON FOR CONSULTATION: Yesenia Hart is a 79 y.o. female with multiple medical problems including transfusion dependent MDS, symptomatic anemia, thrombocytopenia, CAD, diverticular disease with history of rectal bleeding, diabetes, and hypothyroidism.  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  Patient was hospitalized 08/12/2019 -08/15/2019 with rectal bleed.  She was found to be pancytopenic with platelets of 6.  Patient did not to pursue endoscopic evaluation.  Bleeding resolved with platelet transfusion.  Unfortunately, patient was hospitalized again 08/17/2019 -08/18/2019 with epistaxis.  Platelets were 10 on admission and rose to 23 after platelet transfusion.  She is referred to palliative care to help address goals and manage ongoing symptoms.  SOCIAL HISTORY:     reports that she quit smoking about 22 years ago. Her smoking use included cigarettes. She smoked 1.00 pack per day. She has never used smokeless tobacco. She reports previous alcohol use. She reports that she does not use drugs.   Patient is married and lives there with her husband at [facility].  She has a son and daughter in [place] and another daughter in the [place] area.  Daughter is a respiratory therapist.  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  ADVANCE DIRECTIVES:  Not on file  CODE STATUS: DNR/DNI (MOST form completed on 08/21/2019)  PAST MEDICAL HISTORY: Past Medical History:  Diagnosis Date  . Anemia   . B12 deficiency 06/10/2018  . Clotting disorder ([place])   . Diabetes mellitus without complication ([place])   . IBS (irritable bowel syndrome)     . Iron deficiency anemia due to chronic blood loss 12/12/2017  . MDS (myelodysplastic syndrome) ([place])   . MDS (myelodysplastic syndrome) ([place])   . Thyroid disease     PAST SURGICAL HISTORY:  Past Surgical History:  Procedure Laterality Date  . APPENDECTOMY    . breast biopsy 53'    . CHOLECYSTECTOMY    . CORONARY ANGIOPLASTY WITH STENT PLACEMENT  2016   in [place]    HEMATOLOGY/ONCOLOGY HISTORY:  Oncology History Overview Note  # May 2019- [BMBx in [place]]MDS-MDS IPSS-R high risk, mainly due to more than 3 cytogenetics abnormality; On  Vidaza [June 2019]  #    MDS (myelodysplastic syndrome) with 5q deletion ([place])  11/30/2017 Initial Diagnosis   MDS (myelodysplastic syndrome) with 5q deletion ([place])   12/17/2017 -  Chemotherapy   The patient had pegfilgrastim-cbqv (UDENYCA) injection 6 mg, 6 mg, Subcutaneous, Once, 2 of 2 cycles Administration: 6 mg (01/18/2018), 6 mg (02/18/2018) azaCITIDine (VIDAZA) chemo injection 132.5 mg, 75 mg/m2 = 132.5 mg, Subcutaneous,  Once, 16 of 18 cycles Dose modification: 32 mg/m2 (original dose 75 mg/m2, Cycle 17, Reason: Dose not tolerated), 35 mg/m2 (original dose 75 mg/m2, Cycle 17, Reason: Dose not tolerated), 37.5 mg/m2 (original dose 75 mg/m2, Cycle 17, Reason: Catheter Related Infection) Administration: 132.5 mg (12/17/2017), 132.5 mg (12/18/2017), 132.5 mg (12/19/2017), 132.5 mg (12/20/2017), 132.5 mg (12/21/2017), 132.5 mg (01/14/2018), 132.5 mg (01/15/2018), 132.5 mg (04/10/2018), 132.5 mg (05/06/2018), 132.5 mg (05/07/2018), 132.5 mg (05/08/2018), 132.5 mg (05/09/2018), 132.5 mg (05/10/2018), 132.5 mg (02/11/2018), 132.5 mg (03/12/2018), 132.5 mg (01/16/2018), 132.5 mg (01/17/2018), 132.5 mg (02/12/2018), 132.5 mg (02/13/2018), 132.5 mg (02/14/2018), 132.5  mg (02/15/2018), 132.5 mg (03/14/2018), 132.5 mg (03/15/2018), 132.5 mg (03/18/2018), 132.5 mg (04/08/2018), 132.5 mg (04/11/2018), 132.5 mg (04/12/2018), 132.5 mg (06/10/2018), 132.5 mg (06/11/2018), 132.5 mg (06/12/2018), 132.5  mg (06/13/2018), 132.5 mg (06/14/2018), 132.5 mg (07/15/2018), 132.5 mg (07/16/2018), 132.5 mg (07/17/2018), 132.5 mg (07/18/2018), 132.5 mg (07/19/2018), 132.5 mg (09/16/2018), 132.5 mg (09/17/2018), 132.5 mg (09/18/2018), 132.5 mg (09/19/2018), 132.5 mg (09/20/2018), 132.5 mg (08/12/2018), 132.5 mg (08/13/2018), 132.5 mg (08/14/2018), 132.5 mg (08/15/2018), 132.5 mg (08/16/2018), 132.5 mg (11/18/2018), 132.5 mg (11/19/2018), 132.5 mg (11/20/2018), 132.5 mg (11/21/2018), 132.5 mg (11/22/2018), 132.5 mg (10/21/2018), 132.5 mg (10/22/2018), 132.5 mg (10/23/2018), 132.5 mg (10/24/2018), 132.5 mg (10/25/2018), 132.5 mg (01/06/2019), 132.5 mg (01/07/2019), 132.5 mg (01/08/2019), 132.5 mg (01/09/2019), 132.5 mg (01/13/2019), 132.5 mg (02/17/2019), 132.5 mg (02/18/2019), 132.5 mg (02/19/2019), 132.5 mg (02/20/2019), 132.5 mg (02/21/2019), 132.5 mg (03/24/2019), 132.5 mg (03/25/2019), 132.5 mg (03/26/2019), 132.5 mg (03/27/2019), 132.5 mg (03/28/2019), 132.5 mg (05/05/2019), 132.5 mg (05/06/2019), 132.5 mg (05/07/2019), 132.5 mg (05/08/2019), 132.5 mg (05/09/2019)  for chemotherapy treatment.    MDS (myelodysplastic syndrome), high grade (HCC)    ALLERGIES:  is allergic to meperidine; pioglitazone; and acarbose.  MEDICATIONS:  Current Outpatient Medications  Medication Sig Dispense Refill  . aminocaproic acid (AMICAR) 500 MG tablet Take 1 tablet (500 mg total) by mouth every 6 (six) hours. 120 tablet 0  . atorvastatin (LIPITOR) 20 MG tablet TAKE 1 TABLET BY MOUTH ONCE DAILY (Patient taking differently: Take 20 mg by mouth at bedtime. ) 30 tablet 5  . ciprofloxacin (CIPRO) 500 MG tablet Take 1 tablet (500 mg total) by mouth 2 (two) times daily. 60 tablet 0  . Cyanocobalamin (GNP VITAMIN B-12) 1000 MCG TBCR Take 1 tablet (1,000 mcg total) by mouth daily. 90 tablet 1  . fluconazole (DIFLUCAN) 200 MG tablet Take 200 mg by mouth daily.    . furosemide (LASIX) 20 MG tablet Take 1 tablet (20 mg total) by mouth daily.    Marland Kitchen glimepiride (AMARYL) 4 MG tablet Take 1  tablet (4 mg total) by mouth daily.    . Insulin Pen Needle (ULTICARE MICRO PEN NEEDLES) 32G X 4 MM MISC Inject 1 each into the skin 2 (two) times daily. 100 each 12  . Lancets (ONETOUCH ULTRASOFT) lancets USE AS DIRECTED. 100 each 12  . levothyroxine (SYNTHROID) 50 MCG tablet Take 1 tablet (50 mcg total) by mouth daily before breakfast.    . loperamide (IMODIUM) 2 MG capsule Take 2 mg by mouth as needed for diarrhea or loose stools.    . Multiple Vitamin (MULTIVITAMIN WITH MINERALS) TABS tablet Take 1 tablet by mouth daily. 30 tablet 0  . NOVOLOG MIX 70/30 FLEXPEN (70-30) 100 UNIT/ML FlexPen Inject 0.08 mLs (8 Units total) into the skin 2 (two) times daily with a meal. 21 mL 5  . nystatin (MYCOSTATIN/NYSTOP) powder Apply 1 application topically 2 (two) times daily as needed (rash).    . ondansetron (ZOFRAN) 8 MG tablet Take 1 tablet (8 mg total) by mouth every 8 (eight) hours as needed for nausea or vomiting.    Glory Rosebush ULTRA test strip TEST TWICE DAILY 200 each 1  . pantoprazole (PROTONIX) 40 MG tablet Take 1 tablet (40 mg total) by mouth daily.    . quinapril (ACCUPRIL) 20 MG tablet Take 1 tablet (20 mg total) by mouth daily.    Marland Kitchen sulfaSALAzine (AZULFIDINE) 500 MG tablet Take 1 tablet (500 mg total) by mouth at bedtime.    . valACYclovir (VALTREX) 500 MG tablet Take  1 tablet (500 mg total) by mouth 2 (two) times daily. 60 tablet 0   No current facility-administered medications for this visit.   Facility-Administered Medications Ordered in Other Visits  Medication Dose Route Frequency Provider Last Rate Last Admin  . romiPLOStim (NPLATE) injection 500 mcg  500 mcg Subcutaneous Once Earlie Server, MD        VITAL SIGNS: There were no vitals taken for this visit. There were no vitals filed for this visit.  Estimated body mass index is 29.59 kg/m as calculated from the following:   Height as of 08/17/19: 5' 4"  (1.626 m).   Weight as of 08/17/19: 172 lb 6.4 oz (78.2 kg).  LABS: CBC:      Component Value Date/Time   WBC 2.0 (L) 08/20/2019 1316   HGB 8.6 (L) 08/20/2019 1316   HCT 28.5 (L) 08/20/2019 1316   PLT 10 (LL) 08/20/2019 1316   MCV 91.3 08/20/2019 1316   NEUTROABS 0.7 (L) 08/20/2019 1316   LYMPHSABS 0.7 08/20/2019 1316   MONOABS 0.4 08/20/2019 1316   EOSABS 0.0 08/20/2019 1316   BASOSABS 0.0 08/20/2019 1316   Comprehensive Metabolic Panel:    Component Value Date/Time   NA 139 08/18/2019 0039   K 4.3 08/18/2019 0039   CL 108 08/18/2019 0039   CO2 20 (L) 08/18/2019 0039   BUN 39 (H) 08/18/2019 0039   CREATININE 1.00 08/18/2019 0039   GLUCOSE 174 (H) 08/18/2019 0039   CALCIUM 8.7 (L) 08/18/2019 0039   AST 25 08/17/2019 1502   ALT 19 08/17/2019 1502   ALKPHOS 86 08/17/2019 1502   BILITOT 0.8 08/17/2019 1502   PROT 6.6 08/17/2019 1502   ALBUMIN 3.0 (L) 08/17/2019 1502    RADIOGRAPHIC STUDIES: DG Wrist Complete Left  Result Date: 08/14/2019 CLINICAL DATA:  79 year old female with a history of wrist pain EXAM: LEFT WRIST - COMPLETE 3+ VIEW COMPARISON:  None. FINDINGS: Diffuse osteopenia. The scaphoid view demonstrates a linear lucency through the waist of the scaphoid, potentially a nondisplaced fracture though limited by the osteopenia. Advanced degenerative changes at the first carpometacarpal joint. Chondral calcifications at the radiocarpal and ulnar carpal joints. The arcs of the wrist are maintained. Unremarkable appearance of the distal radius and ulna. IMPRESSION: There is a questionable nondisplaced fracture of the scaphoid. If the patient has a history of wrist injury and/or point tenderness at the snuffbox, further evaluation with MR may be considered. Diffuse osteopenia. Degenerative changes, including chondral calcifications. Electronically Signed   By: Corrie Mckusick D.O.   On: 08/14/2019 10:04   DG Wrist Complete Left  Result Date: 08/07/2019 CLINICAL DATA:  Pain, swelling left wrist.  No known injury. EXAM: LEFT WRIST - COMPLETE 3+ VIEW  COMPARISON:  None. FINDINGS: Advanced osteoarthritis at the 1st carpometacarpal joint and radiocarpal joint. No acute bony abnormality. Specifically, no fracture, subluxation, or dislocation. IMPRESSION: Advanced osteoarthritis.  No acute bony abnormality. Electronically Signed   By: Rolm Baptise M.D.   On: 08/07/2019 21:45   MR WRIST LEFT WO CONTRAST  Result Date: 08/15/2019 CLINICAL DATA:  Right wrist pain and swelling. Possible scaphoid fracture. Abnormal x-ray EXAM: MR OF THE LEFT WRIST WITHOUT CONTRAST TECHNIQUE: Multiplanar, multisequence MR imaging of the left wrist was performed. No intravenous contrast was administered. COMPARISON:  X-ray 08/14/2019 FINDINGS: Technical note: Motion degraded examination. Ligaments: Suboptimally evaluated. No evidence of high-grade tear involving the scapholunate or lunotriquetral ligaments. Osseous alignment is maintained. Triangular fibrocartilage: Appears mildly degenerated, grossly intact. Small amount of fluid with suggestion  of synovitis within the DRUJ. Tendons: Flexor and extensor tendons appear grossly intact. No appreciable tenosynovial fluid collections. Carpal tunnel/median nerve: No obvious abnormalities. Guyon's canal: Unremarkable. Joint/cartilage: Advanced degenerative changes throughout the wrist, most pronounced at the triscaphe and radiocarpal joints. Severe degenerative changes at the first carpometacarpal joint with chronic osseous fragmentation. Small effusions with synovitis in the proximal and mid carpal row. Bones/carpal alignment: Scattered subchondral cystic changes within the carpal bones most pronounced at the triquetrum and distal scaphoid pole, possibly representing small erosions. No acute fracture identified. Specifically no scaphoid fracture is seen. No dislocation. Other: Circumferential soft tissue edema. No drainable fluid collection. IMPRESSION: 1. No scaphoid fracture identified. 2. Advanced degenerative changes throughout the wrist,  most pronounced at the first carpometacarpal joint. Small effusions with synovitis throughout the wrist and DRUJ. Findings raise suspicion of an underlying crystalline or inflammatory arthropathy including CPPD and RA. Although felt to be less likely, an infectious arthropathy would be difficult to exclude. If there is clinical suspicion for infection, arthrocentesis should be performed. 3. Nonspecific circumferential soft tissue edema. Electronically Signed   By: Davina Poke D.O.   On: 08/15/2019 13:26    PERFORMANCE STATUS (ECOG) : 3  Review of Systems Unless otherwise noted, a complete review of systems is negative.  Physical Exam General: NAD, frail appearing, thin Pulmonary: Unlabored Extremities: no edema, no joint deformities Skin: no rashes Neurological: Weakness but otherwise nonfocal  IMPRESSION: Patient was an add-on to the clinic schedule today due to decline.  Since discharging from the hospital on 08/18/2019, patient reports that she is significantly weak.  She has developed generalized pain and has nausea with dry heaves today in the clinic.  Platelets were 23k on discharge from the hospital on 2/8 but had precipitously declined to 10k when they were checked again on 2/10.  Patient has no bleeding at present but she is at high risk for spontaneous bleeding given her progressive thrombocytopenia.  I am particularly concerned that she could develop an upper GI bleed given her dry heaves today in the clinic.  Plan is to give her 1 unit of platelets.  I will also give her 8 mg of IV Zofran.    We had a lengthy conversation with patient and daughter regarding goals.  Patient also met with Dr. Tasia Catchings today in the clinic.  Patient has decided to forego future transfusions, work-up, and treatment.  She is not interested in future hospitalization.  She only wants to focus on her comfort and recognizes that she is very likely at end-of-life.  Life expectancy is probably measured in  days.  Both patient and daughter verbalized an interest in hospice involvement.  We have talked about arranging hospice services at [facility].  However, daughter (who is a respiratory therapist) is interested in residential hospice given patient's symptoms and likelihood of spontaneous bleeding.  I do believe that patient is clinically appropriate for residential hospice given her poorly controlled symptoms (nausea and pain), risk of spontaneous bleeding, and likelihood of rapid decline.  I called and spoke with the hospice facilities in [place] and [place].  Unfortunately they had waiting lists.  I also called and spoke with [facility] in [place] and they had available beds.  Daughter requested that I pursue the facility in [place].  We also discussed CODE STATUS.  Again, patient and family reiterated their goal of comfort care only.  They are not interested in resuscitation or life prolonging measures.  I completed a MOST form today. The  patient and family outlined their wishes for the following treatment decisions:  Cardiopulmonary Resuscitation: Do Not Attempt Resuscitation (DNR/No CPR)  Medical Interventions: Comfort Measures: Keep clean, warm, and dry. Use medication by any route, positioning, wound care, and other measures to relieve pain and suffering. Use oxygen, suction and manual treatment of airway obstruction as needed for comfort. Do not transfer to the hospital unless comfort needs cannot be met in current location.  Antibiotics: Determine use of limitation of antibiotics when infection occurs  IV Fluids: No IV fluids (provide other measures to ensure comfort)  Feeding Tube: No feeding tube    PLAN: -Comfort care -Residential hospice -Zofran 8 mg IV x1 -DNR/DNI -MOST Form completed  Case and plan discussed with Dr. Tasia Catchings   Patient expressed understanding and was in agreement with this plan. She also understands that She can call the clinic at any time with any  questions, concerns, or complaints.     Time Total: 60 minutes  Visit consisted of counseling and education dealing with the complex and emotionally intense issues of symptom management and palliative care in the setting of serious and potentially life-threatening illness.Greater than 50%  of this time was spent counseling and coordinating care related to the above assessment and plan.  Signed by: Altha Harm, PhD, NP-C

## 2019-08-21 NOTE — Progress Notes (Signed)
Hematology/Oncology Follow up note [facility] Telephone:(336) [phone] Fax:(336) [phone]   Patient Care Team: Yesenia Hess, MD as PCP - General (Internal Medicine) Yesenia Server, MD as Consulting Physician (Oncology)  REFERRING PROVIDER: Glean Hess, MD  Previous Oncologist Dr.Srujitha Murkutla REASON FOR VISIT Follow up for treatment of MDS  HISTORY OF PRESENTING ILLNESS:  Yesenia Hart is a  79 y.o.  female who recently moved from [place] to [place].  She used to live at [place] with husband.  Accompanied by daughter, Yesenia Hart who is a respiratory therapist at [employer].  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  Patient presented to emergency room in January 2019 with complaints of intermittent rectal bleeding and increased to have a hemoglobin of 3 and platelet counts was 59,000.  Received 5 units of PRBC and was discharged home with hemoglobin of 10.  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  Cauterization was deferred due to thrombocytopenia. Aspirin was discontinued due to GI bleeding. Peripheral blood flow, SPEP negative, no obvious signs of B12 deficiency.  Ultrasound of the abdomen showed mild fatty liver.  On October 04, 2017, patient underwent bone marrow biopsy. Bone marrow core biopsy, bone marrow aspirate clot, bone marrow aspirate smears showed  single linage dysplasia.  Less than 5% bone marrow blasts and no circulating blast. Iron storage is nearly absent Peripheral blood smear showed moderate and anisopoikilocytosis Cytogenetics performed at the [facility] in [place] showed 46,xx, t(3;17;16)(p21;q21;q24), add (5) (q11,2), idic(22)(p11,2)[18]/46,xx[2].  20 metaphases, lites were normal and 18 metaphases 5 q. deletion, it has translocation involving  chromosome of 3, 17, 16, and iso-di centric 22.  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  Patient received IV iron infusion on moving to [place].  She has not had any treatment for MDS done due to the move.  She felt tired, no weight loss.  She reports easy bruising.  Intermittent epistaxis which spontaneously resolved after applying pressure denies any hematochezia, hematemesis, hemoptysis.  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 Although patient does have 5q deletion which if isolated, is a good cytogentic group, she carried other cytogentic abnormalities which increases her IPSS-R score.   # June 2019 started on azacitidine 35m/m2 D1-5 Q28 days treatments  #12/26/2018 repeat bone marrow biopsy results were discussed with patient. Bone marrow biopsy showed normocellular, dysplastic erythroid and megakaryocyte lineages.  Note increase of blast.  Consistent with MDS. MDS FISH panel showed deletion 5 q. Negative for monosomy 5, deletion 7 q., monosomy 7, trisomy 8 deletion 20 q. Cytogenetics reveals a single cell with del(3)(p21) and del(5)(q13q33).  # Last cycle azacitidine treatments was from 05/05/2019-05/12/2019 Azacitidine was stopped due to progressive cytopenia.  # She has chronic colitis with intermittent diarrhea.  She is on sulfasalazine.  Follows up with gastroenterology.  She declines colonoscopy for further work-up  [facility] VKatina Dung TP53 mutation which indicates chemotherapy resistance.  [facility]  Patient is not interested to go on clinical trials as well.  She declines bone marrow biopsy for further evaluation to see if she has progressed to leukemia.  She prefers palliative care  INTERVAL HISTORY GJulienne Vogleris a 79  y.o. female who has above history reviewed by me today presents for follow-up  of MDS.  She has another hospitalization due to epistaxis and went to emergency room.  Epistaxis was controlled with Merocel sponges and the patient also received IV tranexamic acid.  And received platelet transfusion.  Patient was discharged on 08/18/2019.  Her platelet at discharge was 25,000. Patient was accompanied by daughter to the clinic today. She denies any additional bleeding events. She feels weak and lightheaded today.  Mild mid chest pain as well as heartburn sensation.  Also reports feeling nauseated. Left wrist pain has improved after taking a short course of prednisone.  .  Review of Systems  Constitutional: Positive for fatigue. Negative for appetite change, chills and fever.  HENT:   Negative for hearing loss and voice change.   Eyes: Negative for eye problems.  Respiratory: Negative for chest tightness and cough.   Cardiovascular: Negative for chest pain.  Gastrointestinal: Positive for nausea. Negative for abdominal distention and abdominal pain.  Endocrine: Negative for hot flashes.  Genitourinary: Negative for difficulty urinating and frequency.   Musculoskeletal: Negative for arthralgias.  Skin: Negative for itching and rash.  Neurological: Positive for light-headedness. Negative for extremity weakness.  Hematological: Negative for adenopathy.  Psychiatric/Behavioral: Negative for confusion.    MEDICAL HISTORY:  Past Medical History:  Diagnosis Date  . Anemia   . B12 deficiency 06/10/2018  . Clotting disorder ([place])   . Diabetes mellitus without complication ([place])   . IBS (irritable bowel syndrome)   . Iron deficiency anemia due to chronic blood loss 12/12/2017  . MDS (myelodysplastic syndrome) ([place])   . MDS (myelodysplastic syndrome) ([place])   . Thyroid disease     SURGICAL HISTORY: Past Surgical History:  Procedure Laterality Date  . APPENDECTOMY    . breast biopsy 1'    . CHOLECYSTECTOMY    . CORONARY ANGIOPLASTY WITH STENT PLACEMENT  2016   in  [place]: Social History   Socioeconomic History  . Marital status: Married    Spouse name: Not on file  . Number of children: Not on file  . Years of education: Not on file  . Highest education level: Not on file  Occupational History  . Occupation: retired  Tobacco Use  . Smoking status: Former Smoker    Packs/day: 1.00    Types: Cigarettes    Quit date: 1999    Years since quitting: 22.1  . Smokeless tobacco: Never Used  Substance and Sexual Activity  . Alcohol use: Not Currently  . Drug use: Never  . Sexual activity: Yes  Other Topics Concern  . Not on file  Social History Narrative  . Not on file   Social Determinants of Health   Financial Resource Strain:   . Difficulty of Paying Living Expenses: Not on file  Food Insecurity:   . Worried About Charity fundraiser in the Last Year: Not on file  . Ran Out of Food in the Last Year: Not on file  Transportation Needs:   . Lack of Transportation (Medical): Not on file  . Lack of Transportation (Non-Medical): Not on file  Physical Activity:   . Days of Exercise per Week: Not on file  . Minutes of Exercise per Session: Not on file  Stress:   . Feeling of Stress : Not on file  Social Connections:   . Frequency of Communication with Friends and Family: Not on file  . Frequency of Social Gatherings  with Friends and Family: Not on file  . Attends Religious Services: Not on file  . Active Member of Clubs or Organizations: Not on file  . Attends Archivist Meetings: Not on file  . Marital Status: Not on file  Intimate Partner Violence:   . Fear of Current or Ex-Partner: Not on file  . Emotionally Abused: Not on file  . Physically Abused: Not on file  . Sexually Abused: Not on file    FAMILY HISTORY: Family History  Adopted: Yes  Family history unknown: Yes    ALLERGIES:  is allergic to meperidine; pioglitazone; and acarbose.  MEDICATIONS:  Current Outpatient Medications    Medication Sig Dispense Refill  . aminocaproic acid (AMICAR) 500 MG tablet Take 1 tablet (500 mg total) by mouth every 6 (six) hours. 120 tablet 0  . atorvastatin (LIPITOR) 20 MG tablet TAKE 1 TABLET BY MOUTH ONCE DAILY (Patient taking differently: Take 20 mg by mouth at bedtime. ) 30 tablet 5  . ciprofloxacin (CIPRO) 500 MG tablet Take 1 tablet (500 mg total) by mouth 2 (two) times daily. 60 tablet 0  . Cyanocobalamin (GNP VITAMIN B-12) 1000 MCG TBCR Take 1 tablet (1,000 mcg total) by mouth daily. 90 tablet 1  . fluconazole (DIFLUCAN) 200 MG tablet Take 200 mg by mouth daily.    . furosemide (LASIX) 20 MG tablet Take 1 tablet (20 mg total) by mouth daily.    Marland Kitchen glimepiride (AMARYL) 4 MG tablet Take 1 tablet (4 mg total) by mouth daily.    . Insulin Pen Needle (ULTICARE MICRO PEN NEEDLES) 32G X 4 MM MISC Inject 1 each into the skin 2 (two) times daily. 100 each 12  . Lancets (ONETOUCH ULTRASOFT) lancets USE AS DIRECTED. 100 each 12  . levothyroxine (SYNTHROID) 50 MCG tablet Take 1 tablet (50 mcg total) by mouth daily before breakfast.    . loperamide (IMODIUM) 2 MG capsule Take 2 mg by mouth as needed for diarrhea or loose stools.    . Multiple Vitamin (MULTIVITAMIN WITH MINERALS) TABS tablet Take 1 tablet by mouth daily. 30 tablet 0  . NOVOLOG MIX 70/30 FLEXPEN (70-30) 100 UNIT/ML FlexPen Inject 0.08 mLs (8 Units total) into the skin 2 (two) times daily with a meal. 21 mL 5  . nystatin (MYCOSTATIN/NYSTOP) powder Apply 1 application topically 2 (two) times daily as needed (rash).    . ondansetron (ZOFRAN) 8 MG tablet Take 1 tablet (8 mg total) by mouth every 8 (eight) hours as needed for nausea or vomiting.    Glory Rosebush ULTRA test strip TEST TWICE DAILY 200 each 1  . pantoprazole (PROTONIX) 40 MG tablet Take 1 tablet (40 mg total) by mouth daily.    . quinapril (ACCUPRIL) 20 MG tablet Take 1 tablet (20 mg total) by mouth daily.    Marland Kitchen sulfaSALAzine (AZULFIDINE) 500 MG tablet Take 1 tablet (500 mg  total) by mouth at bedtime.    . valACYclovir (VALTREX) 500 MG tablet Take 1 tablet (500 mg total) by mouth 2 (two) times daily. 60 tablet 0   No current facility-administered medications for this visit.     PHYSICAL EXAMINATION: ECOG PERFORMANCE STATUS: 3 - Symptomatic, >50% confined to bed Vitals:   08/21/19 0904  BP: (!) 86/53  Pulse: 76  Resp: 20  Temp: (!) 97 F (36.1 C)  SpO2: 99%   There were no vitals filed for this visit.  Physical Exam Constitutional:      General: She is not in  acute distress.    Comments: Patient states her wheelchair.  HENT:     Head: Normocephalic and atraumatic.  Eyes:     General: No scleral icterus.    Pupils: Pupils are equal, round, and reactive to light.  Cardiovascular:     Rate and Rhythm: Normal rate and regular rhythm.     Heart sounds: Normal heart sounds.  Pulmonary:     Effort: Pulmonary effort is normal.     Breath sounds: Normal breath sounds.     Comments: Bibasilar crackles Abdominal:     General: Bowel sounds are normal. There is no distension.     Palpations: Abdomen is soft. There is no mass.     Tenderness: There is no abdominal tenderness.  Musculoskeletal:        General: No deformity.     Cervical back: Normal range of motion and neck supple.  Lymphadenopathy:     Cervical: No cervical adenopathy.  Skin:    General: Skin is warm and dry.     Coloration: Skin is pale.     Findings: No erythema or rash.     Comments: Multiple bruises   Neurological:     General: No focal deficit present.     Mental Status: She is alert and oriented to person, place, and time.     Cranial Nerves: No cranial nerve deficit.     Coordination: Coordination normal.  Psychiatric:        Mood and Affect: Mood normal.      LABORATORY DATA:  I have reviewed the data as listed Lab Results  Component Value Date   WBC 2.0 (L) 08/20/2019   HGB 8.6 (L) 08/20/2019   HCT 28.5 (L) 08/20/2019   MCV 91.3 08/20/2019   PLT 10 (LL)  08/20/2019   Recent Labs    07/31/19 0835 08/07/19 1955 08/12/19 0914 08/13/19 0629 08/15/19 0556 08/17/19 1502 08/18/19 0039  NA 137   < > 138   < > 139 136 139  K 4.3   < > 4.2   < > 4.4 4.1 4.3  CL 104   < > 105   < > 108 106 108  CO2 22   < > 21*   < > 24 19* 20*  GLUCOSE 318*   < > 265*   < > 157* 301* 174*  BUN 19   < > 21   < > 17 36* 39*  CREATININE 1.34*   < > 1.10*   < > 0.98 1.13* 1.00  CALCIUM 8.6*   < > 8.6*   < > 8.4* 8.8* 8.7*  GFRNONAA 37*   < > 47*   < > 54* 46* 53*  GFRAA 43*   < > 55*   < > >60 53* >60  PROT 6.5  --  6.6  --   --  6.6  --   ALBUMIN 3.3*  --  3.2*  --   --  3.0*  --   AST 17  --  21  --   --  25  --   ALT 12  --  16  --   --  19  --   ALKPHOS 93  --  89  --   --  86  --   BILITOT 1.0  --  1.2  --   --  0.8  --    < > = values in this interval not displayed.    RADIOGRAPHIC STUDIES: I have personally  reviewed the radiological images as listed and agreed with the findings in the report. DG Wrist Complete Left  Result Date: 08/14/2019 CLINICAL DATA:  79 year old female with a history of wrist pain EXAM: LEFT WRIST - COMPLETE 3+ VIEW COMPARISON:  None. FINDINGS: Diffuse osteopenia. The scaphoid view demonstrates a linear lucency through the waist of the scaphoid, potentially a nondisplaced fracture though limited by the osteopenia. Advanced degenerative changes at the first carpometacarpal joint. Chondral calcifications at the radiocarpal and ulnar carpal joints. The arcs of the wrist are maintained. Unremarkable appearance of the distal radius and ulna. IMPRESSION: There is a questionable nondisplaced fracture of the scaphoid. If the patient has a history of wrist injury and/or point tenderness at the snuffbox, further evaluation with MR may be considered. Diffuse osteopenia. Degenerative changes, including chondral calcifications. Electronically Signed   By: Corrie Mckusick D.O.   On: 08/14/2019 10:04   DG Wrist Complete Left  Result Date:  08/07/2019 CLINICAL DATA:  Pain, swelling left wrist.  No known injury. EXAM: LEFT WRIST - COMPLETE 3+ VIEW COMPARISON:  None. FINDINGS: Advanced osteoarthritis at the 1st carpometacarpal joint and radiocarpal joint. No acute bony abnormality. Specifically, no fracture, subluxation, or dislocation. IMPRESSION: Advanced osteoarthritis.  No acute bony abnormality. Electronically Signed   By: Rolm Baptise M.D.   On: 08/07/2019 21:45   US Abdomen Complete  Result Date: 06/30/2019 CLINICAL DATA:  Myelodysplastic syndrome with 5q deletion, thrombocytopenia, anemia, former smoker EXAM: ABDOMEN ULTRASOUND COMPLETE COMPARISON:  None FINDINGS: Gallbladder: Surgically absent Common bile duct: Diameter: 7 mm, normal post cholecystectomy Liver: Suboptimal visualization of intrahepatic detail due to increased echogenicity with sound attenuation as well as body habitus. No gross hepatic mass is visualized on limited assessment. Portal vein is patent on color Doppler imaging with normal direction of blood flow towards the liver. IVC: Normal appearance Pancreas: Normal appearance Spleen: 11.2 cm length with calculated volume of 340 mL. No focal abnormality. Right Kidney: Length: 8.3 cm. Cortical thinning. Increased cortical echogenicity. No mass or hydronephrosis. Left Kidney: Length: 9.0 cm. Cortical thinning. Increased cortical echogenicity. No mass or hydronephrosis. Abdominal aorta: Normal caliber Other findings: No free fluid IMPRESSION: Post cholecystectomy. Inadequate visualization of intrahepatic detail due to sound attenuation; if better intrahepatic visualization is required recommend either MR or CT assessment. BILATERAL renal cortical atrophy and medical renal disease changes. Electronically Signed   By: Lavonia Dana M.D.   On: 06/30/2019 12:36   MR WRIST LEFT WO CONTRAST  Result Date: 08/15/2019 CLINICAL DATA:  Right wrist pain and swelling. Possible scaphoid fracture. Abnormal x-ray EXAM: MR OF THE LEFT WRIST  WITHOUT CONTRAST TECHNIQUE: Multiplanar, multisequence MR imaging of the left wrist was performed. No intravenous contrast was administered. COMPARISON:  X-ray 08/14/2019 FINDINGS: Technical note: Motion degraded examination. Ligaments: Suboptimally evaluated. No evidence of high-grade tear involving the scapholunate or lunotriquetral ligaments. Osseous alignment is maintained. Triangular fibrocartilage: Appears mildly degenerated, grossly intact. Small amount of fluid with suggestion of synovitis within the DRUJ. Tendons: Flexor and extensor tendons appear grossly intact. No appreciable tenosynovial fluid collections. Carpal tunnel/median nerve: No obvious abnormalities. Guyon's canal: Unremarkable. Joint/cartilage: Advanced degenerative changes throughout the wrist, most pronounced at the triscaphe and radiocarpal joints. Severe degenerative changes at the first carpometacarpal joint with chronic osseous fragmentation. Small effusions with synovitis in the proximal and mid carpal row. Bones/carpal alignment: Scattered subchondral cystic changes within the carpal bones most pronounced at the triquetrum and distal scaphoid pole, possibly representing small erosions. No acute fracture identified. Specifically no  scaphoid fracture is seen. No dislocation. Other: Circumferential soft tissue edema. No drainable fluid collection. IMPRESSION: 1. No scaphoid fracture identified. 2. Advanced degenerative changes throughout the wrist, most pronounced at the first carpometacarpal joint. Small effusions with synovitis throughout the wrist and DRUJ. Findings raise suspicion of an underlying crystalline or inflammatory arthropathy including CPPD and RA. Although felt to be less likely, an infectious arthropathy would be difficult to exclude. If there is clinical suspicion for infection, arthrocentesis should be performed. 3. Nonspecific circumferential soft tissue edema. Electronically Signed   By: Davina Poke D.O.   On:  08/15/2019 13:26      ASSESSMENT & PLAN:  1. MDS (myelodysplastic syndrome) with 5q deletion ([place])   2. Goals of care, counseling/discussion   3. Thrombocytopenia ([place])   4. Other neutropenia ([place])   5. 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 Suspect that patient may transformed to acute leukemia.  Patient declines aggressive diagnostic measures including bone marrow biopsy.  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  Platelet has dropped to 10,000 from 25,000 a few days ago. Proceed with 1 unit of irradiated platelet transfusion today. Discussed off label use of Nplate 500 MCG x1 today.  Patient and daughter understand potential side effects and the rationale of using N-plate and agree with the plan. Symptom management: patient will receive IV Protonix and Zofran for heartburn and nausea Chest pain, possibly secondary to heartburn.  Patient does not want aggressive diagnostic work-up #.neutropenia continue Cipro  Valtrex, fluconazole for prophylaxis. #Blood pressure is low today 86/53.  Hold antihypertensives. Goal of care, discussed with patient that she refractory thrombocytopenia which requires platelet transfusion at least 2, maybe 3 times a week.  Patient understands that her prognosis is very poor and she wishes to discuss about hospice.  She understands that on hospice she would not receive blood products in her situation may deteriorate very fast. I have discussed with palliative care Altha Harm who also discussed with patient and daughter today. Patient had decided to be enrolled in hospice.   Yesenia Server, MD, PhD 08/21/2019

## 2019-08-22 ENCOUNTER — Telehealth: Payer: Self-pay | Admitting: *Deleted

## 2019-08-22 DIAGNOSIS — D630 Anemia in neoplastic disease: Secondary | ICD-10-CM | POA: Diagnosis not present

## 2019-08-22 DIAGNOSIS — Z794 Long term (current) use of insulin: Secondary | ICD-10-CM | POA: Diagnosis not present

## 2019-08-22 DIAGNOSIS — E039 Hypothyroidism, unspecified: Secondary | ICD-10-CM | POA: Diagnosis not present

## 2019-08-22 DIAGNOSIS — K5190 Ulcerative colitis, unspecified, without complications: Secondary | ICD-10-CM | POA: Diagnosis not present

## 2019-08-22 DIAGNOSIS — M1990 Unspecified osteoarthritis, unspecified site: Secondary | ICD-10-CM | POA: Diagnosis not present

## 2019-08-22 DIAGNOSIS — R040 Epistaxis: Secondary | ICD-10-CM | POA: Diagnosis not present

## 2019-08-22 DIAGNOSIS — L89151 Pressure ulcer of sacral region, stage 1: Secondary | ICD-10-CM | POA: Diagnosis not present

## 2019-08-22 DIAGNOSIS — D469 Myelodysplastic syndrome, unspecified: Secondary | ICD-10-CM | POA: Diagnosis not present

## 2019-08-22 DIAGNOSIS — E119 Type 2 diabetes mellitus without complications: Secondary | ICD-10-CM | POA: Diagnosis not present

## 2019-08-22 DIAGNOSIS — I2510 Atherosclerotic heart disease of native coronary artery without angina pectoris: Secondary | ICD-10-CM | POA: Diagnosis not present

## 2019-08-22 DIAGNOSIS — I10 Essential (primary) hypertension: Secondary | ICD-10-CM | POA: Diagnosis not present

## 2019-08-22 DIAGNOSIS — K589 Irritable bowel syndrome without diarrhea: Secondary | ICD-10-CM | POA: Diagnosis not present

## 2019-08-22 DIAGNOSIS — D696 Thrombocytopenia, unspecified: Secondary | ICD-10-CM | POA: Diagnosis not present

## 2019-08-22 DIAGNOSIS — Z8719 Personal history of other diseases of the digestive system: Secondary | ICD-10-CM | POA: Diagnosis not present

## 2019-08-22 DIAGNOSIS — M109 Gout, unspecified: Secondary | ICD-10-CM | POA: Diagnosis not present

## 2019-08-22 LAB — BPAM PLATELET PHERESIS
Blood Product Expiration Date: 202102112359
ISSUE DATE / TIME: 202102111055
Unit Type and Rh: 7300

## 2019-08-22 LAB — PREPARE PLATELET PHERESIS: Unit division: 0

## 2019-08-22 MED ORDER — LORAZEPAM 0.5 MG PO TABS: 0.5000 mg | ORAL_TABLET | ORAL | 0 refills | Status: AC | PRN

## 2019-08-22 NOTE — Telephone Encounter (Signed)
Hospice called reporting that patient is having anxiety and is requesting prescription for Lorazepam 0.5  Mg 4 4 hours as needed

## 2019-08-22 NOTE — Telephone Encounter (Signed)
Rx sent 

## 2019-08-25 ENCOUNTER — Encounter: Payer: Self-pay | Admitting: Oncology

## 2019-08-25 ENCOUNTER — Other Ambulatory Visit: Payer: Self-pay

## 2019-08-25 DIAGNOSIS — D696 Thrombocytopenia, unspecified: Secondary | ICD-10-CM | POA: Diagnosis not present

## 2019-08-25 DIAGNOSIS — D630 Anemia in neoplastic disease: Secondary | ICD-10-CM | POA: Diagnosis not present

## 2019-08-25 DIAGNOSIS — I10 Essential (primary) hypertension: Secondary | ICD-10-CM | POA: Diagnosis not present

## 2019-08-25 DIAGNOSIS — D469 Myelodysplastic syndrome, unspecified: Secondary | ICD-10-CM | POA: Diagnosis not present

## 2019-08-25 DIAGNOSIS — I2510 Atherosclerotic heart disease of native coronary artery without angina pectoris: Secondary | ICD-10-CM | POA: Diagnosis not present

## 2019-08-25 DIAGNOSIS — E119 Type 2 diabetes mellitus without complications: Secondary | ICD-10-CM | POA: Diagnosis not present

## 2019-08-25 MED ORDER — PREDNISONE 10 MG PO TABS: 10.0000 mg | ORAL_TABLET | Freq: Every day | ORAL | 0 refills | Status: AC

## 2019-08-26 DIAGNOSIS — D696 Thrombocytopenia, unspecified: Secondary | ICD-10-CM | POA: Diagnosis not present

## 2019-08-26 DIAGNOSIS — I10 Essential (primary) hypertension: Secondary | ICD-10-CM | POA: Diagnosis not present

## 2019-08-26 DIAGNOSIS — I2510 Atherosclerotic heart disease of native coronary artery without angina pectoris: Secondary | ICD-10-CM | POA: Diagnosis not present

## 2019-08-26 DIAGNOSIS — E119 Type 2 diabetes mellitus without complications: Secondary | ICD-10-CM | POA: Diagnosis not present

## 2019-08-26 DIAGNOSIS — D630 Anemia in neoplastic disease: Secondary | ICD-10-CM | POA: Diagnosis not present

## 2019-08-26 DIAGNOSIS — D469 Myelodysplastic syndrome, unspecified: Secondary | ICD-10-CM | POA: Diagnosis not present

## 2019-08-27 DIAGNOSIS — I2510 Atherosclerotic heart disease of native coronary artery without angina pectoris: Secondary | ICD-10-CM | POA: Diagnosis not present

## 2019-08-27 DIAGNOSIS — D469 Myelodysplastic syndrome, unspecified: Secondary | ICD-10-CM | POA: Diagnosis not present

## 2019-08-27 DIAGNOSIS — E119 Type 2 diabetes mellitus without complications: Secondary | ICD-10-CM | POA: Diagnosis not present

## 2019-08-27 DIAGNOSIS — D630 Anemia in neoplastic disease: Secondary | ICD-10-CM | POA: Diagnosis not present

## 2019-08-27 DIAGNOSIS — D696 Thrombocytopenia, unspecified: Secondary | ICD-10-CM | POA: Diagnosis not present

## 2019-08-27 DIAGNOSIS — I10 Essential (primary) hypertension: Secondary | ICD-10-CM | POA: Diagnosis not present

## 2019-08-28 ENCOUNTER — Other Ambulatory Visit: Payer: Medicare Other

## 2019-08-28 ENCOUNTER — Ambulatory Visit: Payer: Medicare Other

## 2019-08-28 DIAGNOSIS — D469 Myelodysplastic syndrome, unspecified: Secondary | ICD-10-CM | POA: Diagnosis not present

## 2019-08-28 DIAGNOSIS — D630 Anemia in neoplastic disease: Secondary | ICD-10-CM | POA: Diagnosis not present

## 2019-08-28 DIAGNOSIS — D696 Thrombocytopenia, unspecified: Secondary | ICD-10-CM | POA: Diagnosis not present

## 2019-08-28 DIAGNOSIS — E119 Type 2 diabetes mellitus without complications: Secondary | ICD-10-CM | POA: Diagnosis not present

## 2019-08-28 DIAGNOSIS — I10 Essential (primary) hypertension: Secondary | ICD-10-CM | POA: Diagnosis not present

## 2019-08-28 DIAGNOSIS — I2510 Atherosclerotic heart disease of native coronary artery without angina pectoris: Secondary | ICD-10-CM | POA: Diagnosis not present

## 2019-08-29 ENCOUNTER — Ambulatory Visit: Payer: Medicare Other | Admitting: Internal Medicine

## 2019-08-29 DIAGNOSIS — D696 Thrombocytopenia, unspecified: Secondary | ICD-10-CM | POA: Diagnosis not present

## 2019-08-29 DIAGNOSIS — I2510 Atherosclerotic heart disease of native coronary artery without angina pectoris: Secondary | ICD-10-CM | POA: Diagnosis not present

## 2019-08-29 DIAGNOSIS — D630 Anemia in neoplastic disease: Secondary | ICD-10-CM | POA: Diagnosis not present

## 2019-08-29 DIAGNOSIS — D469 Myelodysplastic syndrome, unspecified: Secondary | ICD-10-CM | POA: Diagnosis not present

## 2019-08-29 DIAGNOSIS — E119 Type 2 diabetes mellitus without complications: Secondary | ICD-10-CM | POA: Diagnosis not present

## 2019-08-29 DIAGNOSIS — I10 Essential (primary) hypertension: Secondary | ICD-10-CM | POA: Diagnosis not present

## 2019-08-30 DIAGNOSIS — D469 Myelodysplastic syndrome, unspecified: Secondary | ICD-10-CM | POA: Diagnosis not present

## 2019-08-30 DIAGNOSIS — I10 Essential (primary) hypertension: Secondary | ICD-10-CM | POA: Diagnosis not present

## 2019-08-30 DIAGNOSIS — I2510 Atherosclerotic heart disease of native coronary artery without angina pectoris: Secondary | ICD-10-CM | POA: Diagnosis not present

## 2019-08-30 DIAGNOSIS — E119 Type 2 diabetes mellitus without complications: Secondary | ICD-10-CM | POA: Diagnosis not present

## 2019-08-30 DIAGNOSIS — D696 Thrombocytopenia, unspecified: Secondary | ICD-10-CM | POA: Diagnosis not present

## 2019-08-30 DIAGNOSIS — D630 Anemia in neoplastic disease: Secondary | ICD-10-CM | POA: Diagnosis not present

## 2019-08-31 DIAGNOSIS — I10 Essential (primary) hypertension: Secondary | ICD-10-CM | POA: Diagnosis not present

## 2019-08-31 DIAGNOSIS — I2510 Atherosclerotic heart disease of native coronary artery without angina pectoris: Secondary | ICD-10-CM | POA: Diagnosis not present

## 2019-08-31 DIAGNOSIS — D630 Anemia in neoplastic disease: Secondary | ICD-10-CM | POA: Diagnosis not present

## 2019-08-31 DIAGNOSIS — D469 Myelodysplastic syndrome, unspecified: Secondary | ICD-10-CM | POA: Diagnosis not present

## 2019-08-31 DIAGNOSIS — E119 Type 2 diabetes mellitus without complications: Secondary | ICD-10-CM | POA: Diagnosis not present

## 2019-08-31 DIAGNOSIS — D696 Thrombocytopenia, unspecified: Secondary | ICD-10-CM | POA: Diagnosis not present

## 2019-09-01 ENCOUNTER — Ambulatory Visit: Payer: Medicare Other

## 2019-09-01 ENCOUNTER — Other Ambulatory Visit: Payer: Medicare Other

## 2019-09-01 DIAGNOSIS — D469 Myelodysplastic syndrome, unspecified: Secondary | ICD-10-CM | POA: Diagnosis not present

## 2019-09-01 DIAGNOSIS — D630 Anemia in neoplastic disease: Secondary | ICD-10-CM | POA: Diagnosis not present

## 2019-09-01 DIAGNOSIS — D696 Thrombocytopenia, unspecified: Secondary | ICD-10-CM | POA: Diagnosis not present

## 2019-09-01 DIAGNOSIS — E119 Type 2 diabetes mellitus without complications: Secondary | ICD-10-CM | POA: Diagnosis not present

## 2019-09-01 DIAGNOSIS — I2510 Atherosclerotic heart disease of native coronary artery without angina pectoris: Secondary | ICD-10-CM | POA: Diagnosis not present

## 2019-09-01 DIAGNOSIS — I10 Essential (primary) hypertension: Secondary | ICD-10-CM | POA: Diagnosis not present

## 2019-09-02 DIAGNOSIS — I10 Essential (primary) hypertension: Secondary | ICD-10-CM | POA: Diagnosis not present

## 2019-09-02 DIAGNOSIS — I2510 Atherosclerotic heart disease of native coronary artery without angina pectoris: Secondary | ICD-10-CM | POA: Diagnosis not present

## 2019-09-02 DIAGNOSIS — D469 Myelodysplastic syndrome, unspecified: Secondary | ICD-10-CM | POA: Diagnosis not present

## 2019-09-02 DIAGNOSIS — E119 Type 2 diabetes mellitus without complications: Secondary | ICD-10-CM | POA: Diagnosis not present

## 2019-09-02 DIAGNOSIS — D696 Thrombocytopenia, unspecified: Secondary | ICD-10-CM | POA: Diagnosis not present

## 2019-09-02 DIAGNOSIS — D630 Anemia in neoplastic disease: Secondary | ICD-10-CM | POA: Diagnosis not present

## 2019-09-03 DIAGNOSIS — D696 Thrombocytopenia, unspecified: Secondary | ICD-10-CM | POA: Diagnosis not present

## 2019-09-03 DIAGNOSIS — I2510 Atherosclerotic heart disease of native coronary artery without angina pectoris: Secondary | ICD-10-CM | POA: Diagnosis not present

## 2019-09-03 DIAGNOSIS — E119 Type 2 diabetes mellitus without complications: Secondary | ICD-10-CM | POA: Diagnosis not present

## 2019-09-03 DIAGNOSIS — D630 Anemia in neoplastic disease: Secondary | ICD-10-CM | POA: Diagnosis not present

## 2019-09-03 DIAGNOSIS — D469 Myelodysplastic syndrome, unspecified: Secondary | ICD-10-CM | POA: Diagnosis not present

## 2019-09-03 DIAGNOSIS — I10 Essential (primary) hypertension: Secondary | ICD-10-CM | POA: Diagnosis not present

## 2019-09-04 ENCOUNTER — Other Ambulatory Visit: Payer: Medicare Other

## 2019-09-04 ENCOUNTER — Ambulatory Visit: Payer: Medicare Other

## 2019-09-04 DIAGNOSIS — D630 Anemia in neoplastic disease: Secondary | ICD-10-CM | POA: Diagnosis not present

## 2019-09-04 DIAGNOSIS — I2510 Atherosclerotic heart disease of native coronary artery without angina pectoris: Secondary | ICD-10-CM | POA: Diagnosis not present

## 2019-09-04 DIAGNOSIS — E119 Type 2 diabetes mellitus without complications: Secondary | ICD-10-CM | POA: Diagnosis not present

## 2019-09-04 DIAGNOSIS — D469 Myelodysplastic syndrome, unspecified: Secondary | ICD-10-CM | POA: Diagnosis not present

## 2019-09-04 DIAGNOSIS — D696 Thrombocytopenia, unspecified: Secondary | ICD-10-CM | POA: Diagnosis not present

## 2019-09-04 DIAGNOSIS — I10 Essential (primary) hypertension: Secondary | ICD-10-CM | POA: Diagnosis not present

## 2019-09-05 DIAGNOSIS — I2510 Atherosclerotic heart disease of native coronary artery without angina pectoris: Secondary | ICD-10-CM | POA: Diagnosis not present

## 2019-09-05 DIAGNOSIS — D630 Anemia in neoplastic disease: Secondary | ICD-10-CM | POA: Diagnosis not present

## 2019-09-05 DIAGNOSIS — D696 Thrombocytopenia, unspecified: Secondary | ICD-10-CM | POA: Diagnosis not present

## 2019-09-05 DIAGNOSIS — D469 Myelodysplastic syndrome, unspecified: Secondary | ICD-10-CM | POA: Diagnosis not present

## 2019-09-05 DIAGNOSIS — I10 Essential (primary) hypertension: Secondary | ICD-10-CM | POA: Diagnosis not present

## 2019-09-05 DIAGNOSIS — E119 Type 2 diabetes mellitus without complications: Secondary | ICD-10-CM | POA: Diagnosis not present

## 2019-09-06 DIAGNOSIS — E119 Type 2 diabetes mellitus without complications: Secondary | ICD-10-CM | POA: Diagnosis not present

## 2019-09-06 DIAGNOSIS — D696 Thrombocytopenia, unspecified: Secondary | ICD-10-CM | POA: Diagnosis not present

## 2019-09-06 DIAGNOSIS — I10 Essential (primary) hypertension: Secondary | ICD-10-CM | POA: Diagnosis not present

## 2019-09-06 DIAGNOSIS — D469 Myelodysplastic syndrome, unspecified: Secondary | ICD-10-CM | POA: Diagnosis not present

## 2019-09-06 DIAGNOSIS — I2510 Atherosclerotic heart disease of native coronary artery without angina pectoris: Secondary | ICD-10-CM | POA: Diagnosis not present

## 2019-09-06 DIAGNOSIS — D630 Anemia in neoplastic disease: Secondary | ICD-10-CM | POA: Diagnosis not present

## 2019-09-07 DIAGNOSIS — D696 Thrombocytopenia, unspecified: Secondary | ICD-10-CM | POA: Diagnosis not present

## 2019-09-07 DIAGNOSIS — I2510 Atherosclerotic heart disease of native coronary artery without angina pectoris: Secondary | ICD-10-CM | POA: Diagnosis not present

## 2019-09-07 DIAGNOSIS — I10 Essential (primary) hypertension: Secondary | ICD-10-CM | POA: Diagnosis not present

## 2019-09-07 DIAGNOSIS — D469 Myelodysplastic syndrome, unspecified: Secondary | ICD-10-CM | POA: Diagnosis not present

## 2019-09-07 DIAGNOSIS — E119 Type 2 diabetes mellitus without complications: Secondary | ICD-10-CM | POA: Diagnosis not present

## 2019-09-07 DIAGNOSIS — D630 Anemia in neoplastic disease: Secondary | ICD-10-CM | POA: Diagnosis not present

## 2019-09-08 DIAGNOSIS — M109 Gout, unspecified: Secondary | ICD-10-CM | POA: Diagnosis not present

## 2019-09-08 DIAGNOSIS — Z8719 Personal history of other diseases of the digestive system: Secondary | ICD-10-CM | POA: Diagnosis not present

## 2019-09-08 DIAGNOSIS — Z794 Long term (current) use of insulin: Secondary | ICD-10-CM | POA: Diagnosis not present

## 2019-09-08 DIAGNOSIS — K5190 Ulcerative colitis, unspecified, without complications: Secondary | ICD-10-CM | POA: Diagnosis not present

## 2019-09-08 DIAGNOSIS — D630 Anemia in neoplastic disease: Secondary | ICD-10-CM | POA: Diagnosis not present

## 2019-09-08 DIAGNOSIS — D469 Myelodysplastic syndrome, unspecified: Secondary | ICD-10-CM | POA: Diagnosis not present

## 2019-09-08 DIAGNOSIS — D696 Thrombocytopenia, unspecified: Secondary | ICD-10-CM | POA: Diagnosis not present

## 2019-09-08 DIAGNOSIS — R040 Epistaxis: Secondary | ICD-10-CM | POA: Diagnosis not present

## 2019-09-08 DIAGNOSIS — K589 Irritable bowel syndrome without diarrhea: Secondary | ICD-10-CM | POA: Diagnosis not present

## 2019-09-08 DIAGNOSIS — E039 Hypothyroidism, unspecified: Secondary | ICD-10-CM | POA: Diagnosis not present

## 2019-09-08 DIAGNOSIS — E119 Type 2 diabetes mellitus without complications: Secondary | ICD-10-CM | POA: Diagnosis not present

## 2019-09-08 DIAGNOSIS — I2510 Atherosclerotic heart disease of native coronary artery without angina pectoris: Secondary | ICD-10-CM | POA: Diagnosis not present

## 2019-09-08 DIAGNOSIS — I10 Essential (primary) hypertension: Secondary | ICD-10-CM | POA: Diagnosis not present

## 2019-09-08 DIAGNOSIS — M1990 Unspecified osteoarthritis, unspecified site: Secondary | ICD-10-CM | POA: Diagnosis not present

## 2019-09-08 DIAGNOSIS — L89151 Pressure ulcer of sacral region, stage 1: Secondary | ICD-10-CM | POA: Diagnosis not present

## 2019-09-09 DIAGNOSIS — D696 Thrombocytopenia, unspecified: Secondary | ICD-10-CM | POA: Diagnosis not present

## 2019-09-09 DIAGNOSIS — D469 Myelodysplastic syndrome, unspecified: Secondary | ICD-10-CM | POA: Diagnosis not present

## 2019-09-09 DIAGNOSIS — E119 Type 2 diabetes mellitus without complications: Secondary | ICD-10-CM | POA: Diagnosis not present

## 2019-09-09 DIAGNOSIS — I10 Essential (primary) hypertension: Secondary | ICD-10-CM | POA: Diagnosis not present

## 2019-09-09 DIAGNOSIS — I2510 Atherosclerotic heart disease of native coronary artery without angina pectoris: Secondary | ICD-10-CM | POA: Diagnosis not present

## 2019-09-09 DIAGNOSIS — D630 Anemia in neoplastic disease: Secondary | ICD-10-CM | POA: Diagnosis not present

## 2019-09-10 DIAGNOSIS — D696 Thrombocytopenia, unspecified: Secondary | ICD-10-CM | POA: Diagnosis not present

## 2019-09-10 DIAGNOSIS — D630 Anemia in neoplastic disease: Secondary | ICD-10-CM | POA: Diagnosis not present

## 2019-09-10 DIAGNOSIS — I10 Essential (primary) hypertension: Secondary | ICD-10-CM | POA: Diagnosis not present

## 2019-09-10 DIAGNOSIS — E119 Type 2 diabetes mellitus without complications: Secondary | ICD-10-CM | POA: Diagnosis not present

## 2019-09-10 DIAGNOSIS — I2510 Atherosclerotic heart disease of native coronary artery without angina pectoris: Secondary | ICD-10-CM | POA: Diagnosis not present

## 2019-09-10 DIAGNOSIS — D469 Myelodysplastic syndrome, unspecified: Secondary | ICD-10-CM | POA: Diagnosis not present

## 2019-09-11 DIAGNOSIS — E119 Type 2 diabetes mellitus without complications: Secondary | ICD-10-CM | POA: Diagnosis not present

## 2019-09-11 DIAGNOSIS — D469 Myelodysplastic syndrome, unspecified: Secondary | ICD-10-CM | POA: Diagnosis not present

## 2019-09-11 DIAGNOSIS — I2510 Atherosclerotic heart disease of native coronary artery without angina pectoris: Secondary | ICD-10-CM | POA: Diagnosis not present

## 2019-09-11 DIAGNOSIS — D696 Thrombocytopenia, unspecified: Secondary | ICD-10-CM | POA: Diagnosis not present

## 2019-09-11 DIAGNOSIS — D630 Anemia in neoplastic disease: Secondary | ICD-10-CM | POA: Diagnosis not present

## 2019-09-11 DIAGNOSIS — I10 Essential (primary) hypertension: Secondary | ICD-10-CM | POA: Diagnosis not present

## 2019-09-12 ENCOUNTER — Encounter: Payer: Self-pay | Admitting: Oncology

## 2019-09-12 DIAGNOSIS — D630 Anemia in neoplastic disease: Secondary | ICD-10-CM | POA: Diagnosis not present

## 2019-09-12 DIAGNOSIS — D696 Thrombocytopenia, unspecified: Secondary | ICD-10-CM | POA: Diagnosis not present

## 2019-09-12 DIAGNOSIS — I10 Essential (primary) hypertension: Secondary | ICD-10-CM | POA: Diagnosis not present

## 2019-09-12 DIAGNOSIS — D469 Myelodysplastic syndrome, unspecified: Secondary | ICD-10-CM | POA: Diagnosis not present

## 2019-09-12 DIAGNOSIS — E119 Type 2 diabetes mellitus without complications: Secondary | ICD-10-CM | POA: Diagnosis not present

## 2019-09-12 DIAGNOSIS — I2510 Atherosclerotic heart disease of native coronary artery without angina pectoris: Secondary | ICD-10-CM | POA: Diagnosis not present

## 2019-10-09 DEATH — deceased

## 2019-11-12 ENCOUNTER — Encounter: Payer: Medicare Other | Admitting: Internal Medicine

## 2019-11-14 ENCOUNTER — Encounter: Payer: Medicare Other | Admitting: Internal Medicine

## 2019-11-28 ENCOUNTER — Encounter: Payer: Medicare Other | Admitting: Internal Medicine

## 2021-05-23 IMAGING — US US ABDOMEN COMPLETE
1 series · 13 of 25 positions shown · non-contrast
Comparison: None

CLINICAL DATA: Myelodysplastic syndrome with 5q deletion,
thrombocytopenia, anemia, former smoker

EXAM:
ABDOMEN ULTRASOUND COMPLETE

[Series 1: us abdomen complete · 13 of 80 slices shown]
[im 1/80]
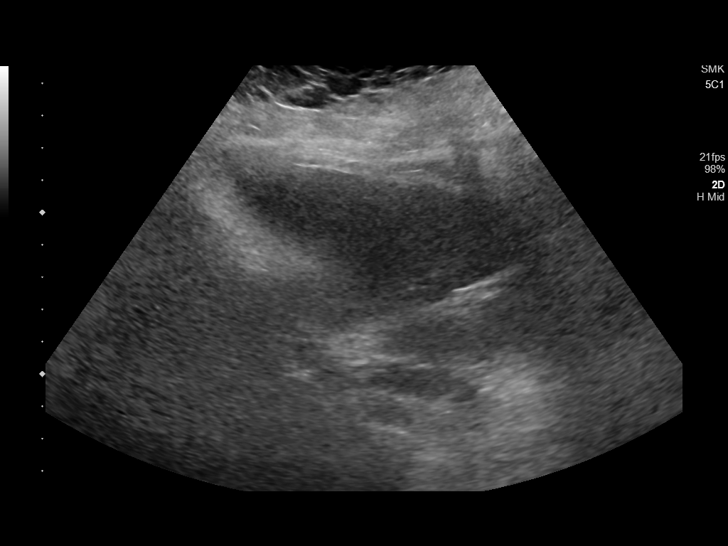
[im 7/80]
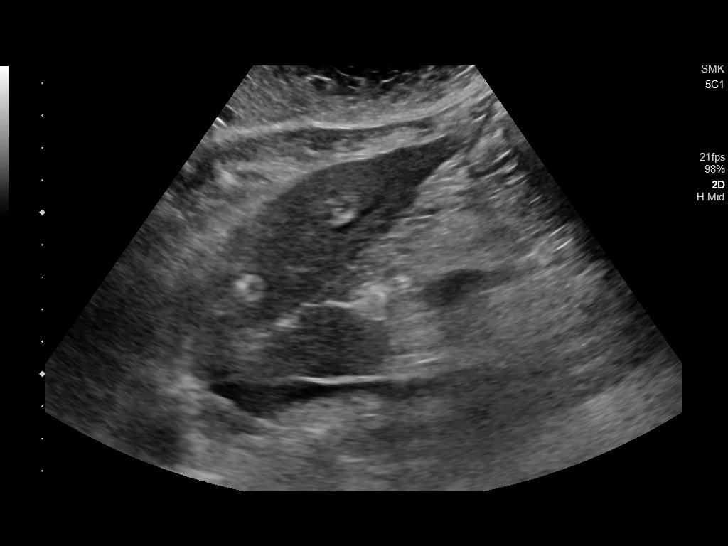
[im 14/80]
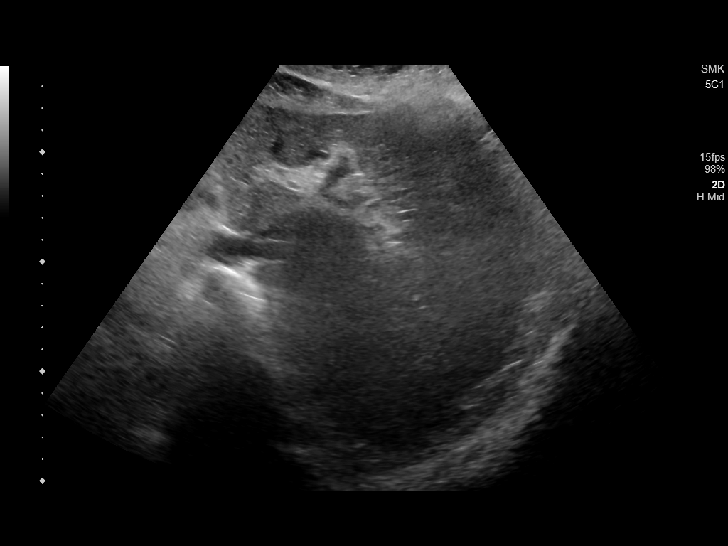
[im 20/80]
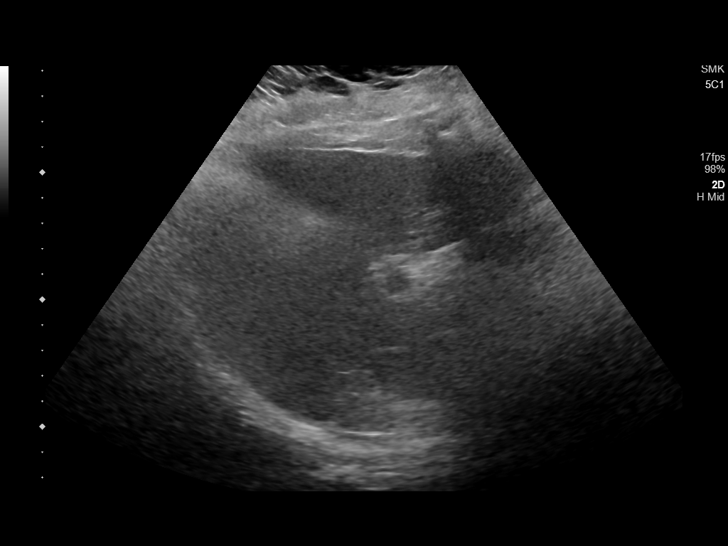
[im 27/80]
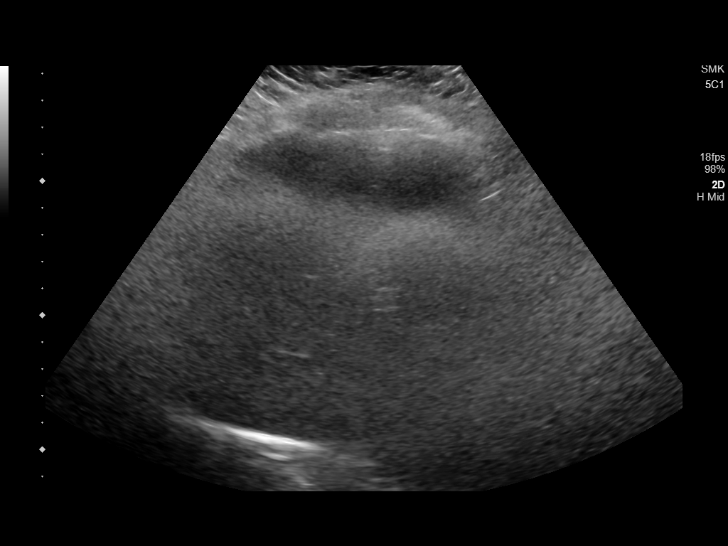
[im 33/80]
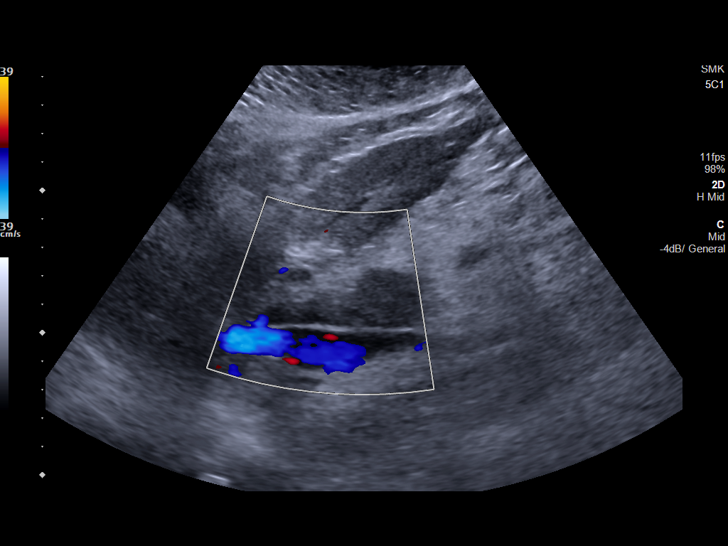
[im 40/80]
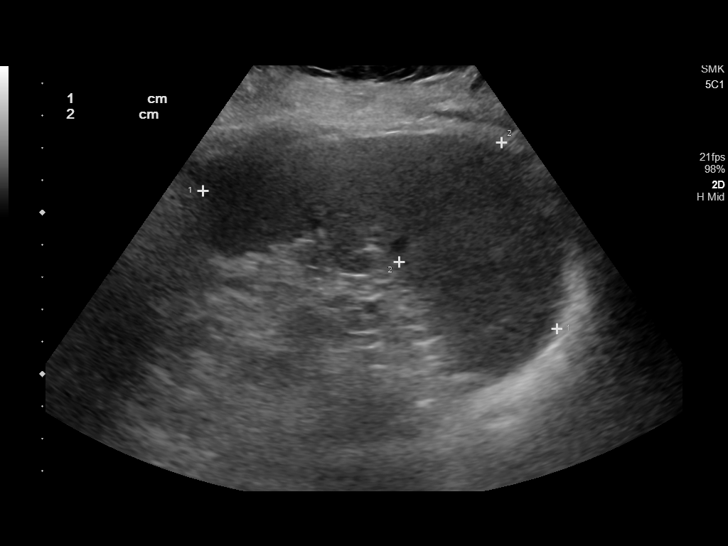
[im 47/80]
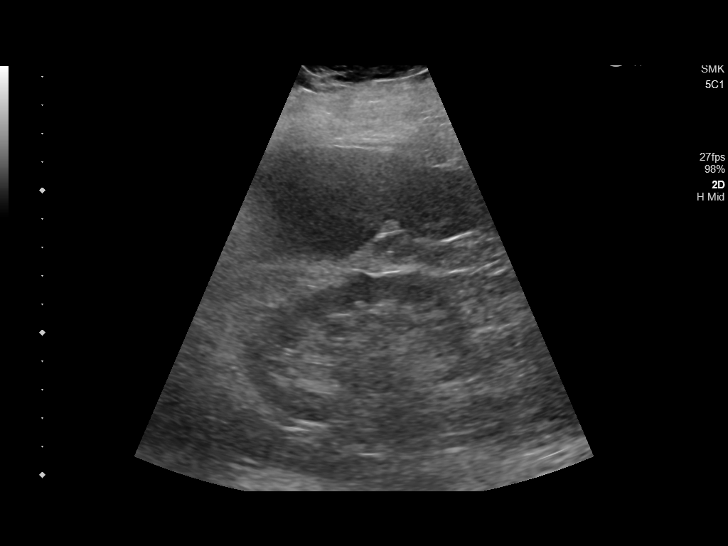
[im 53/80]
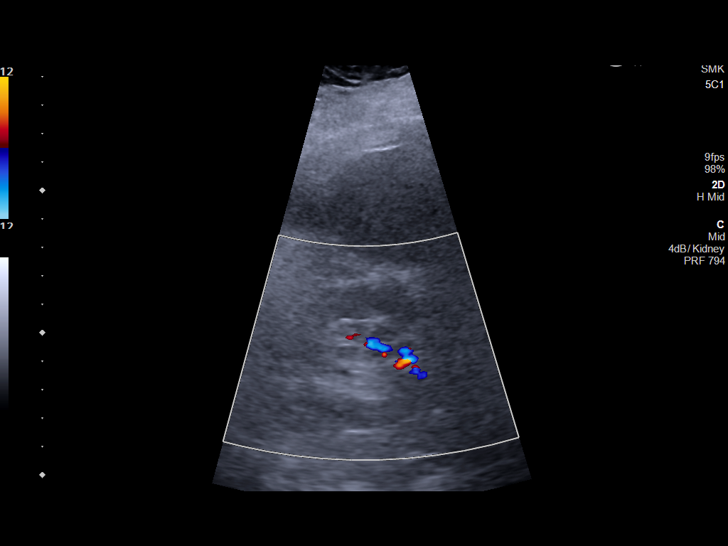
[im 60/80]
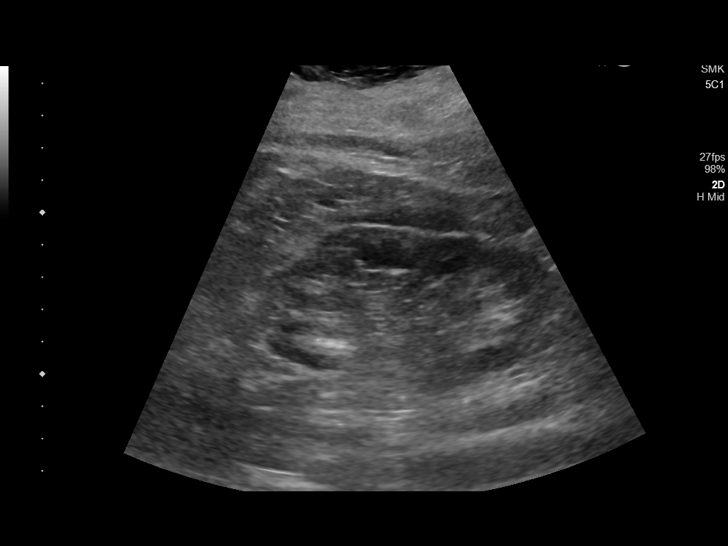
[im 66/80]
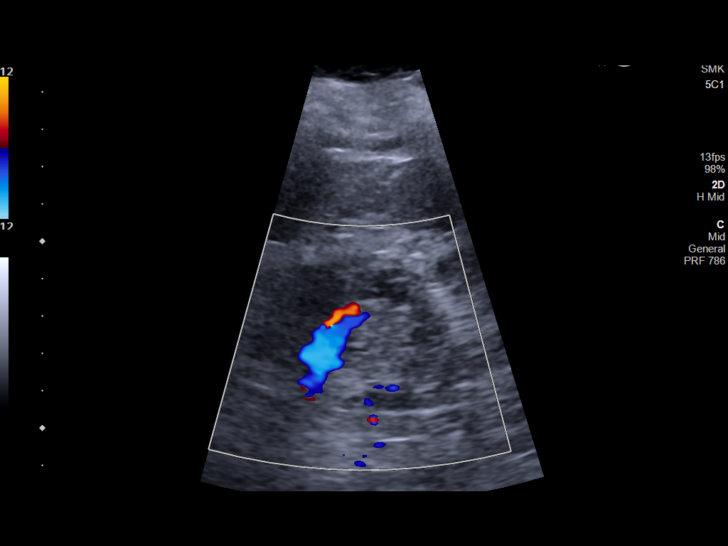
[im 73/80]
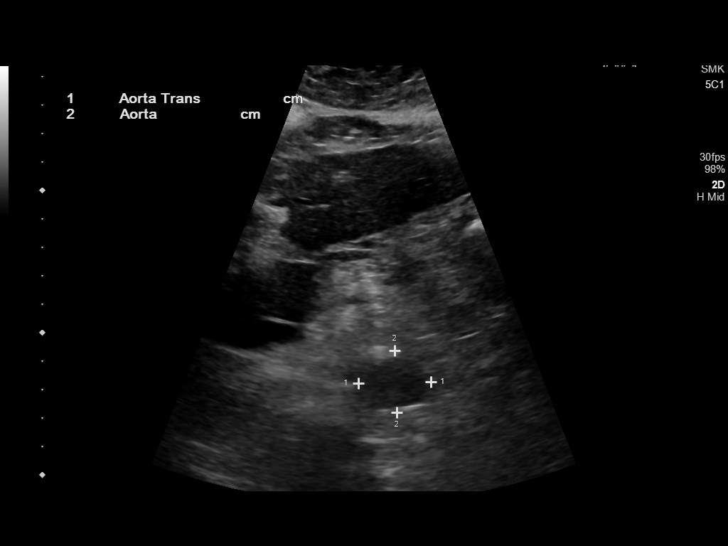
[im 80/80]
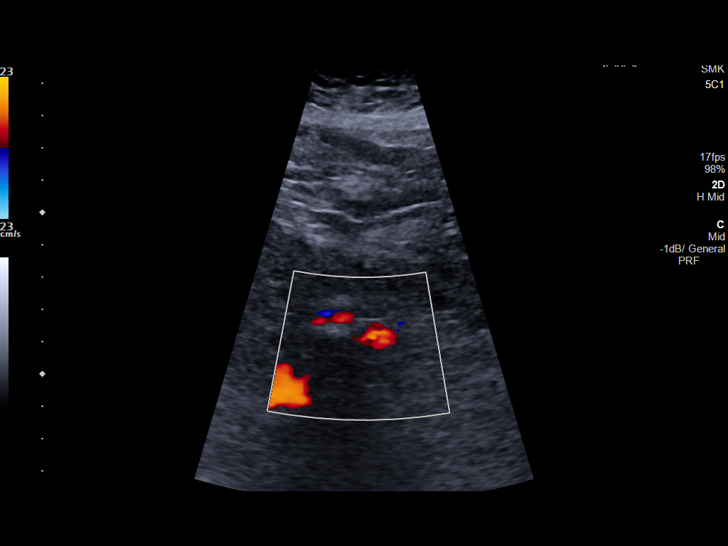

[13 of 25 positions shown; findings below may reference images not displayed]

FINDINGS: Gallbladder: Surgically absent

Common bile duct: Diameter: 7 mm, normal post cholecystectomy

Liver: Suboptimal visualization of intrahepatic detail due to
increased echogenicity with sound attenuation as well as body
habitus. No gross hepatic mass is visualized on limited assessment.
Portal vein is patent on color Doppler imaging with normal direction
of blood flow towards the liver.

IVC: Normal appearance

Pancreas: Normal appearance

Spleen: 11.2 cm length with calculated volume of 340 mL. No focal
abnormality.

Right Kidney: Length: 8.3 cm. Cortical thinning. Increased cortical
echogenicity. No mass or hydronephrosis.

Left Kidney: Length: 9.0 cm. Cortical thinning. Increased cortical
echogenicity. No mass or hydronephrosis.

Abdominal aorta: Normal caliber

Other findings: No free fluid
IMPRESSION: Post cholecystectomy.

Inadequate visualization of intrahepatic detail due to sound
attenuation; if better intrahepatic visualization is required
recommend either MR or CT assessment.

BILATERAL renal cortical atrophy and medical renal disease changes.

## 2021-07-08 IMAGING — MR MR WRIST*L* W/O CM
8 series · 40 of 40 positions shown · non-contrast
Comparison: X-ray 08/14/2019

CLINICAL DATA: Right wrist pain and swelling. Possible scaphoid
fracture. Abnormal x-ray

EXAM:
MR OF THE LEFT WRIST WITHOUT CONTRAST
TECHNIQUE: Multiplanar, multisequence MR imaging of the left wrist was
performed. No intravenous contrast was administered.

[Series 3: T2 fat-sat · axial · left · 2.0mm · 0.47mm/px · z∈[-20,+37]mm · 5 of 25 slices shown (1 of 4)]
[im 1/25]
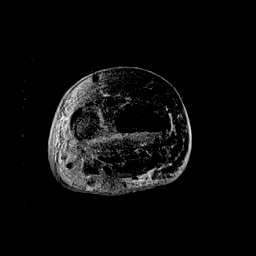
[im 7/25]
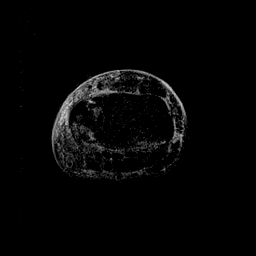
[im 13/25]
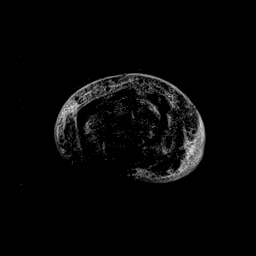
[im 19/25]
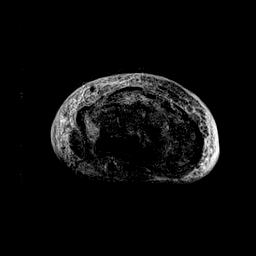
[im 25/25]
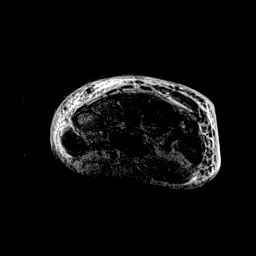

[Series 4: T1 · axial · left · 2.0mm · 0.47mm/px · z∈[-20,+37]mm · 5 of 25 slices shown (1 of 2)]
[im 1/25]
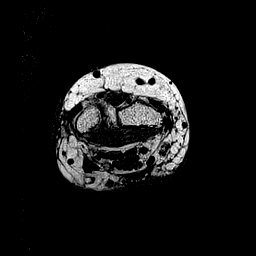
[im 7/25]
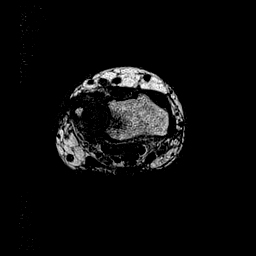
[im 13/25]
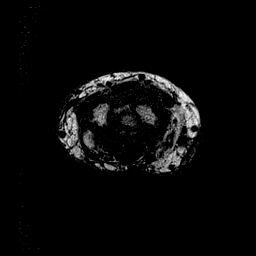
[im 19/25]
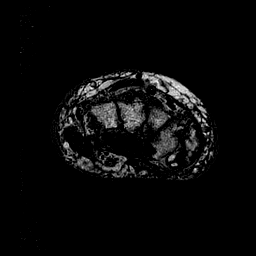
[im 25/25]
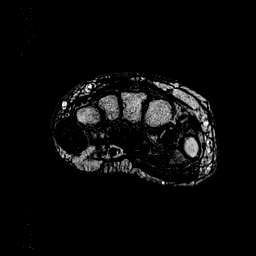

[Series 6: T2 fat-sat · coronal · left · 3.0mm · 0.39mm/px · 4 of 17 slices shown (2 of 4)]
[im 1/17]
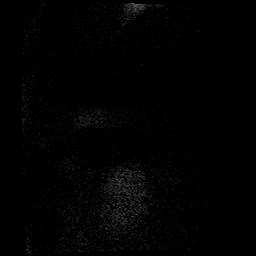
[im 6/17]
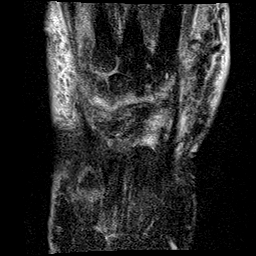
[im 11/17]
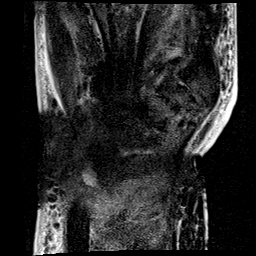
[im 17/17]
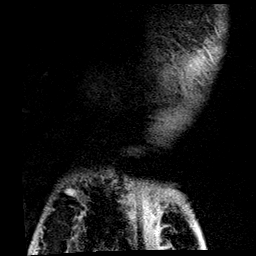

[Series 7: T1 · coronal · left · 3.0mm · 0.39mm/px · 4 of 17 slices shown (2 of 2)]
[im 1/17]
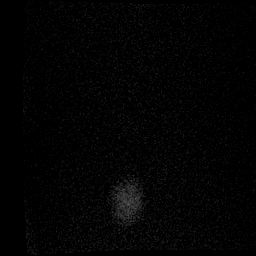
[im 6/17]
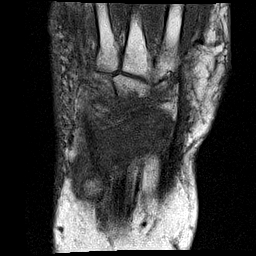
[im 11/17]
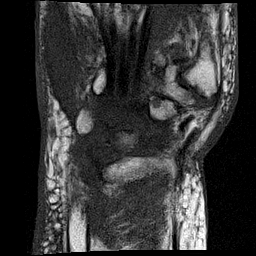
[im 17/17]
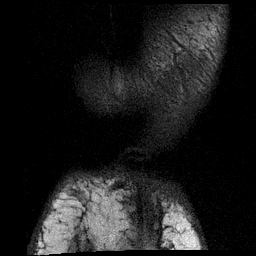

[Series 8: PD fat-sat · coronal · left · 3.0mm · 0.39mm/px · 4 of 17 slices shown (1 of 2)]
[im 1/17]
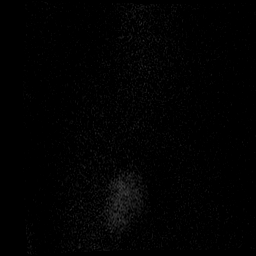
[im 6/17]
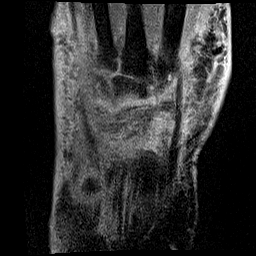
[im 11/17]
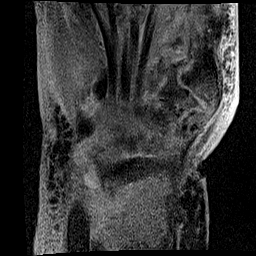
[im 17/17]
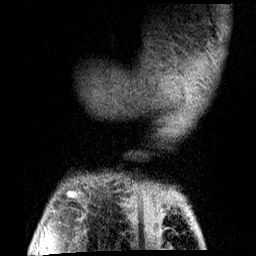

[Series 9: PD fat-sat · sagittal · left · 3.0mm · 0.39mm/px · 5 of 25 slices shown (2 of 2)]
[im 1/25]
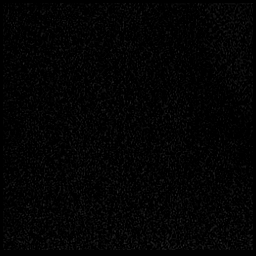
[im 7/25]
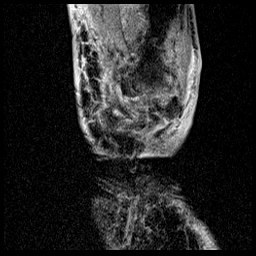
[im 13/25]
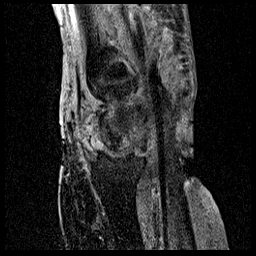
[im 19/25]
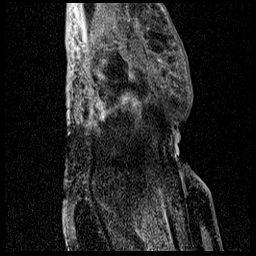
[im 25/25]
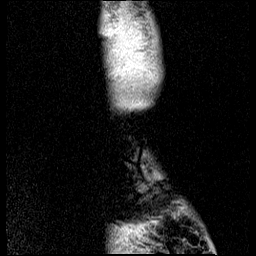

[Series 10: T2 fat-sat · coronal · left · 2.0mm · 0.48mm/px · 5 of 25 slices shown (3 of 4)]
[im 1/25]
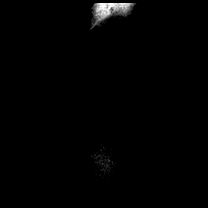
[im 7/25]
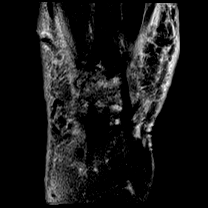
[im 13/25]
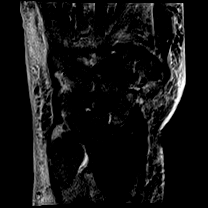
[im 19/25]
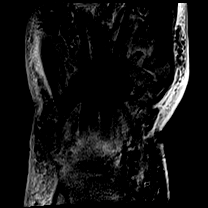
[im 25/25]
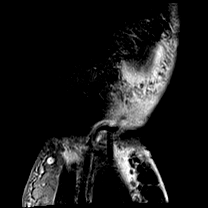

[Series 11: T2 fat-sat · sagittal · left · 2.0mm · 0.48mm/px · 8 of 36 slices shown (4 of 4)]
[im 1/36]
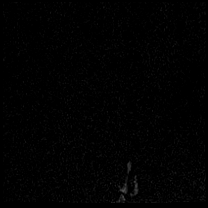
[im 6/36]
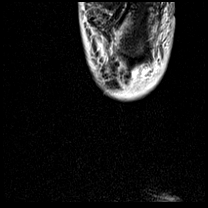
[im 11/36]
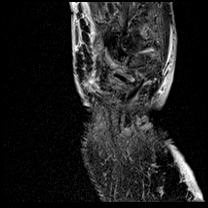
[im 16/36]
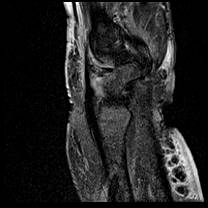
[im 21/36]
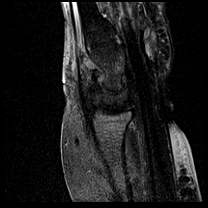
[im 26/36]
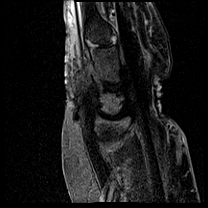
[im 31/36]
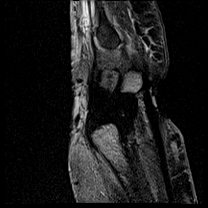
[im 36/36]
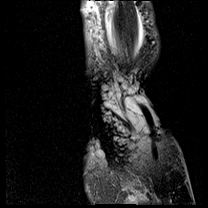

[40 of 40 positions shown; findings below may reference images not displayed]

FINDINGS: Technical note: Motion degraded examination.

Ligaments: Suboptimally evaluated. No evidence of high-grade tear
involving the scapholunate or lunotriquetral ligaments. Osseous
alignment is maintained.

Triangular fibrocartilage: Appears mildly degenerated, grossly
intact. Small amount of fluid with suggestion of synovitis within
the DRUJ.

Tendons: Flexor and extensor tendons appear grossly intact. No
appreciable tenosynovial fluid collections.

Carpal tunnel/median nerve: No obvious abnormalities.

Guyon's canal: Unremarkable.

Joint/cartilage: Advanced degenerative changes throughout the wrist,
most pronounced at the triscaphe and radiocarpal joints. Severe
degenerative changes at the first carpometacarpal joint with chronic
osseous fragmentation. Small effusions with synovitis in the
proximal and mid carpal row.

Bones/carpal alignment: Scattered subchondral cystic changes within
the carpal bones most pronounced at the triquetrum and distal
scaphoid pole, possibly representing small erosions. No acute
fracture identified. Specifically no scaphoid fracture is seen. No
dislocation.

Other: Circumferential soft tissue edema. No drainable fluid
collection.
IMPRESSION: 1. No scaphoid fracture identified.
2. Advanced degenerative changes throughout the wrist, most
pronounced at the first carpometacarpal joint. Small effusions with
synovitis throughout the wrist and DRUJ. Findings raise suspicion of
an underlying crystalline or inflammatory arthropathy including CPPD
and RA. Although felt to be less likely, an infectious arthropathy
would be difficult to exclude. If there is clinical suspicion for
infection, arthrocentesis should be performed.
3. Nonspecific circumferential soft tissue edema.
